# Patient Record
Sex: Male | Born: 1940 | Race: White | Hispanic: No | Smoking: Former smoker
Health system: Southern US, Community
[De-identification: ages and names within clinical notes are randomized; demographics above are authoritative.]

## PROBLEM LIST (undated history)

## (undated) DIAGNOSIS — I2510 Atherosclerotic heart disease of native coronary artery without angina pectoris: Secondary | ICD-10-CM

## (undated) DIAGNOSIS — I7143 Infrarenal abdominal aortic aneurysm, without rupture: Secondary | ICD-10-CM

## (undated) DIAGNOSIS — E669 Obesity, unspecified: Secondary | ICD-10-CM

## (undated) DIAGNOSIS — I5042 Chronic combined systolic (congestive) and diastolic (congestive) heart failure: Secondary | ICD-10-CM

## (undated) DIAGNOSIS — F419 Anxiety disorder, unspecified: Secondary | ICD-10-CM

## (undated) DIAGNOSIS — F32A Depression, unspecified: Secondary | ICD-10-CM

## (undated) DIAGNOSIS — Z8489 Family history of other specified conditions: Secondary | ICD-10-CM

## (undated) DIAGNOSIS — M5126 Other intervertebral disc displacement, lumbar region: Secondary | ICD-10-CM

## (undated) DIAGNOSIS — I714 Abdominal aortic aneurysm, without rupture: Secondary | ICD-10-CM

## (undated) DIAGNOSIS — E119 Type 2 diabetes mellitus without complications: Secondary | ICD-10-CM

## (undated) DIAGNOSIS — M1990 Unspecified osteoarthritis, unspecified site: Secondary | ICD-10-CM

## (undated) DIAGNOSIS — I2119 ST elevation (STEMI) myocardial infarction involving other coronary artery of inferior wall: Secondary | ICD-10-CM

## (undated) DIAGNOSIS — I10 Essential (primary) hypertension: Secondary | ICD-10-CM

## (undated) DIAGNOSIS — E781 Pure hyperglyceridemia: Secondary | ICD-10-CM

## (undated) DIAGNOSIS — K219 Gastro-esophageal reflux disease without esophagitis: Secondary | ICD-10-CM

## (undated) DIAGNOSIS — I4819 Other persistent atrial fibrillation: Secondary | ICD-10-CM

## (undated) DIAGNOSIS — I255 Ischemic cardiomyopathy: Secondary | ICD-10-CM

## (undated) HISTORY — PX: TONSILLECTOMY: SUR1361

## (undated) HISTORY — PX: BACK SURGERY: SHX140

## (undated) HISTORY — DX: Gastro-esophageal reflux disease without esophagitis: K21.9

## (undated) HISTORY — DX: Obesity, unspecified: E66.9

## (undated) HISTORY — DX: Essential (primary) hypertension: I10

## (undated) HISTORY — DX: ST elevation (STEMI) myocardial infarction involving other coronary artery of inferior wall: I21.19

## (undated) HISTORY — PX: LUMBAR DISC SURGERY: SHX700

---

## 1997-05-26 HISTORY — PX: CORONARY ARTERY BYPASS GRAFT: SHX141

## 1998-03-26 HISTORY — PX: OTHER SURGICAL HISTORY: SHX169

## 1998-04-13 ENCOUNTER — Inpatient Hospital Stay (HOSPITAL_COMMUNITY): Admission: EM | Admit: 1998-04-13 | Discharge: 1998-04-22 | Payer: Self-pay | Admitting: Internal Medicine

## 1998-04-13 ENCOUNTER — Encounter: Payer: Self-pay | Admitting: Internal Medicine

## 1998-04-16 ENCOUNTER — Encounter: Payer: Self-pay | Admitting: Cardiothoracic Surgery

## 1998-04-17 ENCOUNTER — Encounter: Payer: Self-pay | Admitting: Cardiothoracic Surgery

## 1998-04-18 ENCOUNTER — Encounter: Payer: Self-pay | Admitting: Cardiothoracic Surgery

## 1998-04-19 ENCOUNTER — Encounter: Payer: Self-pay | Admitting: Cardiothoracic Surgery

## 1998-06-03 ENCOUNTER — Emergency Department (HOSPITAL_COMMUNITY): Admission: EM | Admit: 1998-06-03 | Discharge: 1998-06-03 | Payer: Self-pay | Admitting: Emergency Medicine

## 1998-06-03 ENCOUNTER — Encounter: Payer: Self-pay | Admitting: Emergency Medicine

## 2000-11-01 ENCOUNTER — Emergency Department (HOSPITAL_COMMUNITY): Admission: EM | Admit: 2000-11-01 | Discharge: 2000-11-01 | Payer: Self-pay | Admitting: Emergency Medicine

## 2006-01-25 ENCOUNTER — Other Ambulatory Visit: Payer: Self-pay

## 2006-01-25 ENCOUNTER — Emergency Department: Payer: Self-pay | Admitting: Unknown Physician Specialty

## 2006-01-26 ENCOUNTER — Emergency Department: Payer: Self-pay | Admitting: Emergency Medicine

## 2011-07-17 DIAGNOSIS — E119 Type 2 diabetes mellitus without complications: Secondary | ICD-10-CM | POA: Diagnosis not present

## 2011-07-17 DIAGNOSIS — E7800 Pure hypercholesterolemia, unspecified: Secondary | ICD-10-CM | POA: Diagnosis not present

## 2011-07-17 DIAGNOSIS — I259 Chronic ischemic heart disease, unspecified: Secondary | ICD-10-CM | POA: Diagnosis not present

## 2011-07-17 DIAGNOSIS — I10 Essential (primary) hypertension: Secondary | ICD-10-CM | POA: Diagnosis not present

## 2011-10-31 DIAGNOSIS — E785 Hyperlipidemia, unspecified: Secondary | ICD-10-CM | POA: Diagnosis not present

## 2011-10-31 DIAGNOSIS — E559 Vitamin D deficiency, unspecified: Secondary | ICD-10-CM | POA: Diagnosis not present

## 2011-10-31 DIAGNOSIS — E1129 Type 2 diabetes mellitus with other diabetic kidney complication: Secondary | ICD-10-CM | POA: Diagnosis not present

## 2011-11-07 DIAGNOSIS — E1129 Type 2 diabetes mellitus with other diabetic kidney complication: Secondary | ICD-10-CM | POA: Diagnosis not present

## 2011-11-07 DIAGNOSIS — E785 Hyperlipidemia, unspecified: Secondary | ICD-10-CM | POA: Diagnosis not present

## 2011-11-07 DIAGNOSIS — I10 Essential (primary) hypertension: Secondary | ICD-10-CM | POA: Diagnosis not present

## 2011-11-07 DIAGNOSIS — I2510 Atherosclerotic heart disease of native coronary artery without angina pectoris: Secondary | ICD-10-CM | POA: Diagnosis not present

## 2011-11-07 DIAGNOSIS — N182 Chronic kidney disease, stage 2 (mild): Secondary | ICD-10-CM | POA: Diagnosis not present

## 2012-05-03 DIAGNOSIS — E559 Vitamin D deficiency, unspecified: Secondary | ICD-10-CM | POA: Diagnosis not present

## 2012-05-03 DIAGNOSIS — Z125 Encounter for screening for malignant neoplasm of prostate: Secondary | ICD-10-CM | POA: Diagnosis not present

## 2012-05-03 DIAGNOSIS — Z0000 Encounter for general adult medical examination without abnormal findings: Secondary | ICD-10-CM | POA: Diagnosis not present

## 2012-05-03 DIAGNOSIS — Z1331 Encounter for screening for depression: Secondary | ICD-10-CM | POA: Diagnosis not present

## 2012-05-03 DIAGNOSIS — E1129 Type 2 diabetes mellitus with other diabetic kidney complication: Secondary | ICD-10-CM | POA: Diagnosis not present

## 2012-05-03 DIAGNOSIS — E669 Obesity, unspecified: Secondary | ICD-10-CM | POA: Diagnosis not present

## 2012-05-03 DIAGNOSIS — E785 Hyperlipidemia, unspecified: Secondary | ICD-10-CM | POA: Diagnosis not present

## 2012-05-03 DIAGNOSIS — I10 Essential (primary) hypertension: Secondary | ICD-10-CM | POA: Diagnosis not present

## 2012-05-04 DIAGNOSIS — H5200 Hypermetropia, unspecified eye: Secondary | ICD-10-CM | POA: Diagnosis not present

## 2012-05-04 DIAGNOSIS — H25019 Cortical age-related cataract, unspecified eye: Secondary | ICD-10-CM | POA: Diagnosis not present

## 2012-05-04 DIAGNOSIS — H52229 Regular astigmatism, unspecified eye: Secondary | ICD-10-CM | POA: Diagnosis not present

## 2012-05-04 DIAGNOSIS — H2510 Age-related nuclear cataract, unspecified eye: Secondary | ICD-10-CM | POA: Diagnosis not present

## 2012-05-10 DIAGNOSIS — E1129 Type 2 diabetes mellitus with other diabetic kidney complication: Secondary | ICD-10-CM | POA: Diagnosis not present

## 2012-05-10 DIAGNOSIS — I10 Essential (primary) hypertension: Secondary | ICD-10-CM | POA: Diagnosis not present

## 2012-05-10 DIAGNOSIS — N182 Chronic kidney disease, stage 2 (mild): Secondary | ICD-10-CM | POA: Diagnosis not present

## 2012-05-10 DIAGNOSIS — E785 Hyperlipidemia, unspecified: Secondary | ICD-10-CM | POA: Diagnosis not present

## 2012-05-24 DIAGNOSIS — E782 Mixed hyperlipidemia: Secondary | ICD-10-CM | POA: Diagnosis not present

## 2012-05-24 DIAGNOSIS — I25810 Atherosclerosis of coronary artery bypass graft(s) without angina pectoris: Secondary | ICD-10-CM | POA: Diagnosis not present

## 2012-05-24 DIAGNOSIS — I10 Essential (primary) hypertension: Secondary | ICD-10-CM | POA: Diagnosis not present

## 2012-05-27 ENCOUNTER — Other Ambulatory Visit (HOSPITAL_COMMUNITY): Payer: Self-pay | Admitting: Internal Medicine

## 2012-05-27 DIAGNOSIS — Z951 Presence of aortocoronary bypass graft: Secondary | ICD-10-CM

## 2012-05-27 DIAGNOSIS — R9431 Abnormal electrocardiogram [ECG] [EKG]: Secondary | ICD-10-CM

## 2012-05-31 ENCOUNTER — Ambulatory Visit (HOSPITAL_COMMUNITY)
Admission: RE | Admit: 2012-05-31 | Discharge: 2012-05-31 | Disposition: A | Discharge status: Home / Self Care | Payer: Medicare Other | Source: Ambulatory Visit | Attending: Internal Medicine | Admitting: Internal Medicine

## 2012-05-31 DIAGNOSIS — I369 Nonrheumatic tricuspid valve disorder, unspecified: Secondary | ICD-10-CM | POA: Insufficient documentation

## 2012-05-31 DIAGNOSIS — I080 Rheumatic disorders of both mitral and aortic valves: Secondary | ICD-10-CM | POA: Diagnosis not present

## 2012-05-31 DIAGNOSIS — Z951 Presence of aortocoronary bypass graft: Secondary | ICD-10-CM

## 2012-05-31 DIAGNOSIS — R9431 Abnormal electrocardiogram [ECG] [EKG]: Secondary | ICD-10-CM | POA: Diagnosis not present

## 2012-05-31 NOTE — Progress Notes (Signed)
2D Echo Performed 05/31/2012    [employer], [facility]  

## 2012-06-14 ENCOUNTER — Emergency Department: Payer: Self-pay | Admitting: Unknown Physician Specialty

## 2012-06-14 DIAGNOSIS — S61209A Unspecified open wound of unspecified finger without damage to nail, initial encounter: Secondary | ICD-10-CM | POA: Diagnosis not present

## 2012-06-24 ENCOUNTER — Emergency Department: Payer: Self-pay | Admitting: Emergency Medicine

## 2012-06-24 DIAGNOSIS — I252 Old myocardial infarction: Secondary | ICD-10-CM | POA: Diagnosis not present

## 2012-06-24 DIAGNOSIS — Z951 Presence of aortocoronary bypass graft: Secondary | ICD-10-CM | POA: Diagnosis not present

## 2012-06-24 DIAGNOSIS — Z4802 Encounter for removal of sutures: Secondary | ICD-10-CM | POA: Diagnosis not present

## 2012-06-29 DIAGNOSIS — J019 Acute sinusitis, unspecified: Secondary | ICD-10-CM | POA: Diagnosis not present

## 2012-07-01 ENCOUNTER — Encounter (HOSPITAL_COMMUNITY): Payer: Self-pay | Admitting: *Deleted

## 2012-07-01 ENCOUNTER — Emergency Department (HOSPITAL_COMMUNITY)
Admission: EM | Admit: 2012-07-01 | Discharge: 2012-07-01 | Disposition: A | Discharge status: Home / Self Care | Payer: Medicare Other | Attending: Emergency Medicine | Admitting: Emergency Medicine

## 2012-07-01 ENCOUNTER — Emergency Department (HOSPITAL_COMMUNITY): Payer: Medicare Other

## 2012-07-01 DIAGNOSIS — J069 Acute upper respiratory infection, unspecified: Secondary | ICD-10-CM | POA: Insufficient documentation

## 2012-07-01 DIAGNOSIS — Z8739 Personal history of other diseases of the musculoskeletal system and connective tissue: Secondary | ICD-10-CM | POA: Insufficient documentation

## 2012-07-01 DIAGNOSIS — R0981 Nasal congestion: Secondary | ICD-10-CM

## 2012-07-01 DIAGNOSIS — J3489 Other specified disorders of nose and nasal sinuses: Secondary | ICD-10-CM | POA: Diagnosis not present

## 2012-07-01 DIAGNOSIS — I2510 Atherosclerotic heart disease of native coronary artery without angina pectoris: Secondary | ICD-10-CM | POA: Insufficient documentation

## 2012-07-01 DIAGNOSIS — R062 Wheezing: Secondary | ICD-10-CM | POA: Diagnosis not present

## 2012-07-01 DIAGNOSIS — Z951 Presence of aortocoronary bypass graft: Secondary | ICD-10-CM | POA: Diagnosis not present

## 2012-07-01 DIAGNOSIS — E781 Pure hyperglyceridemia: Secondary | ICD-10-CM | POA: Insufficient documentation

## 2012-07-01 DIAGNOSIS — J988 Other specified respiratory disorders: Secondary | ICD-10-CM

## 2012-07-01 DIAGNOSIS — Z79899 Other long term (current) drug therapy: Secondary | ICD-10-CM | POA: Insufficient documentation

## 2012-07-01 DIAGNOSIS — J811 Chronic pulmonary edema: Secondary | ICD-10-CM | POA: Diagnosis not present

## 2012-07-01 HISTORY — DX: Pure hyperglyceridemia: E78.1

## 2012-07-01 HISTORY — DX: Other intervertebral disc displacement, lumbar region: M51.26

## 2012-07-01 HISTORY — DX: Atherosclerotic heart disease of native coronary artery without angina pectoris: I25.10

## 2012-07-01 HISTORY — DX: Unspecified osteoarthritis, unspecified site: M19.90

## 2012-07-01 LAB — BASIC METABOLIC PANEL
BUN: 16 mg/dL (ref 6–23)
CO2: 26 mEq/L (ref 19–32)
Chloride: 105 mEq/L (ref 96–112)
Creatinine, Ser: 1.21 mg/dL (ref 0.50–1.35)
Glucose, Bld: 109 mg/dL — ABNORMAL HIGH (ref 70–99)

## 2012-07-01 LAB — CBC WITH DIFFERENTIAL/PLATELET
HCT: 41.6 % (ref 39.0–52.0)
Hemoglobin: 13.9 g/dL (ref 13.0–17.0)
Lymphocytes Relative: 22 % (ref 12–46)
Lymphs Abs: 1.1 10*3/uL (ref 0.7–4.0)
MCV: 91.2 fL (ref 78.0–100.0)
Monocytes Absolute: 0.8 10*3/uL (ref 0.1–1.0)
Monocytes Relative: 15 % — ABNORMAL HIGH (ref 3–12)
Neutro Abs: 2.9 10*3/uL (ref 1.7–7.7)
WBC: 5 10*3/uL (ref 4.0–10.5)

## 2012-07-01 MED ORDER — ALBUTEROL SULFATE (5 MG/ML) 0.5% IN NEBU
5.0000 mg | INHALATION_SOLUTION | Freq: Once | RESPIRATORY_TRACT | Status: AC
  Administered 2012-07-01: 5 mg via RESPIRATORY_TRACT
  Filled 2012-07-01: qty 1

## 2012-07-01 MED ORDER — AEROCHAMBER Z-STAT PLUS/MEDIUM MISC
1.0000 | Freq: Once | Status: AC
  Administered 2012-07-01: 1
  Filled 2012-07-01: qty 1

## 2012-07-01 MED ORDER — ALBUTEROL SULFATE HFA 108 (90 BASE) MCG/ACT IN AERS
1.0000 | INHALATION_SPRAY | RESPIRATORY_TRACT | Status: DC | PRN
  Administered 2012-07-01: 2 via RESPIRATORY_TRACT
  Filled 2012-07-01: qty 6.7

## 2012-07-01 MED ORDER — SALINE SPRAY 0.65 % NA SOLN
1.0000 | Freq: Once | NASAL | Status: AC
  Administered 2012-07-01: 1 via NASAL
  Filled 2012-07-01: qty 44

## 2012-07-01 NOTE — ED Notes (Signed)
Pt denies SOB but states "I just feel like I am not getting enough oxygen. It started on Sunday, but has gotten a lot worse since yesterday evening. I have also had a headache and feel congested." Pt reports runny nose since Monday and dry cough since Sunday.

## 2012-07-01 NOTE — ED Notes (Addendum)
Ambulated pt in hall, sats remained at 96-94% on RA

## 2012-07-01 NOTE — ED Notes (Signed)
Pt reports being diagnosed with a sinus infection at Dr. Zebedee Iba office Tuesday. Was prescribed a Z-pack, cough syrup with codeine, and afrin spray. States afrin and codeine caused his heart rate to speed up. States that he used nasacort and had some relief with it. States that he cannot breath due to the sinus congestion.

## 2012-07-01 NOTE — ED Notes (Signed)
Pt at 91% on RA. Placed on 2L.

## 2012-07-01 NOTE — ED Provider Notes (Signed)
History     CSN: [id number]  Arrival date & time 07/01/12  0113   First MD Initiated Contact with Patient 07/01/12 0405      Chief Complaint  Patient presents with  . Shortness of Breath    (Consider location/radiation/quality/duration/timing/severity/associated sxs/prior treatment) HPI 72 yo male presents to the ER with complaint of nasal congestion and difficulty breathing.  Pt reports onset of URI sxs on Sunday with congestion, slight cough.  No fevers.  Pt was seen by his PCM on Tuesday, started on zpack, afrin, cough syrup.  About 4 hours after taking afrin and cough syrup, pt was woken with palpitations around 2 am.  Pt has self d/c afrin and cough syrup, and has switched to otc nasal steroid spray.  Pt feels this has helped slightly, but still having difficulties breathing through his nose.  No chest pain, no productive cough, no fevers.  No leg swelling, abd distension.   Past Medical History  Diagnosis Date  . Coronary artery disease   . Ruptured lumbar disc   . Arthritis   . High triglycerides     Past Surgical History  Procedure Date  . Coronary artery bypass graft   . Lumbar disc surgery   . Tonsillectomy     No family history on file.  History  Substance Use Topics  . Smoking status: Not on file  . Smokeless tobacco: Not on file  . Alcohol Use: No      Review of Systems  All other systems reviewed and are negative.    Allergies  Aspirin and Other  Home Medications   Current Outpatient Rx  Name  Route  Sig  Dispense  Refill  . AZITHROMYCIN 250 MG PO TABS   Oral   Take 250 mg by mouth daily.         Marland Kitchen GEMFIBROZIL 600 MG PO TABS   Oral   Take 600 mg by mouth 2 (two) times daily before a meal.         . GUAIFENESIN-CODEINE 100-10 MG/5ML PO SYRP   Oral   Take 10 mLs by mouth 3 (three) times daily as needed. For cough         . LISINOPRIL 5 MG PO TABS   Oral   Take 5 mg by mouth at bedtime.         Marland Kitchen METOPROLOL TARTRATE 25 MG PO  TABS   Oral   Take 12.5 mg by mouth 2 (two) times daily.         Marland Kitchen RANITIDINE HCL 150 MG PO TABS   Oral   Take 150 mg by mouth 2 (two) times daily.         . TRIAMCINOLONE ACETONIDE 55 MCG/ACT NA INHA   Nasal   Place 2 sprays into the nose daily.           BP 142/83  Pulse 76  Temp 98 F (36.7 C) (Oral)  Resp 19  Ht 5\' 11"  (1.803 m)  Wt 284 lb (128.822 kg)  BMI 39.61 kg/m2  SpO2 95%  Physical Exam  Nursing note and vitals reviewed. Constitutional: He is oriented to person, place, and time. He appears well-developed and well-nourished.  HENT:  Head: Normocephalic and atraumatic.  Right Ear: External ear normal.  Left Ear: External ear normal.  Mouth/Throat: Oropharynx is clear and moist.       Nasal congestion noted  Eyes: Conjunctivae normal and EOM are normal. Pupils are equal, round, and reactive to  light.  Neck: Normal range of motion. Neck supple. No JVD present. No tracheal deviation present. No thyromegaly present.  Cardiovascular: Normal rate, regular rhythm, normal heart sounds and intact distal pulses.  Exam reveals no gallop and no friction rub.   No murmur heard. Pulmonary/Chest: Effort normal and breath sounds normal. No stridor. No respiratory distress. He has no wheezes. He has no rales. He exhibits no tenderness.  Abdominal: Soft. Bowel sounds are normal. He exhibits no distension and no mass. There is no tenderness. There is no rebound and no guarding.  Musculoskeletal: Normal range of motion. He exhibits no edema and no tenderness.  Lymphadenopathy:    He has no cervical adenopathy.  Neurological: He is alert and oriented to person, place, and time. He exhibits normal muscle tone. Coordination normal.  Skin: Skin is dry. No rash noted. No erythema. No pallor.  Psychiatric: He has a normal mood and affect. His behavior is normal. Judgment and thought content normal.    ED Course  Procedures (including critical care time)  Labs Reviewed  CBC  WITH DIFFERENTIAL - Abnormal; Notable for the following:    Monocytes Relative 15 (*)     All other components within normal limits  BASIC METABOLIC PANEL - Abnormal; Notable for the following:    Glucose, Bld 109 (*)     GFR calc non Af Amer 58 (*)     GFR calc Af Amer 68 (*)     All other components within normal limits   Dg Chest 2 View  07/01/2012  *RADIOLOGY REPORT*  Clinical Data: Cough, shortness of breath, wheezing.  CHEST - 2 VIEW  Comparison: None.  Findings: Postoperative changes in the mediastinum with sternotomy wires, surgical clips, and vascular markers present.  Shallow inspiration.  Cardiac enlargement with mild pulmonary vascular congestion.  Hazy perihilar changes suggest early edema.  There appears to be increased density in the left lower lung behind the heart suggesting superimposed infiltration or pneumonia.  No blunting of costophrenic angles.  No pneumothorax.  Degenerative changes in the spine.  IMPRESSION: Cardiac enlargement with pulmonary vascular congestion and mild perihilar edema.  Superimposed infiltration in the left lung base suggest pneumonia.   Original Report Authenticated By: Burman Nieves, M.D.     Date: 07/01/2012  Rate: 67  Rhythm: normal sinus rhythm and premature ventricular contractions (PVC)  QRS Axis: normal  Intervals: PR prolonged  ST/T Wave abnormalities: normal  Conduction Disutrbances:first-degree A-V block   Narrative Interpretation: RBBB has resolved  Old EKG Reviewed: changes noted    1. Nasal congestion   2. Upper respiratory infection   3. Wheezing-associated respiratory infection (WARI)       MDM  72 year old male with upper respiratory illness, nasal congestion. Chest x-ray suggests possible infiltrate, but he has not had fever, has normal white count and no significant cough. He has had some wheezing today. He is feeling better after nasal saline, and albuterol inhaler. He has a bili without drop in his O2 saturation. Will  discharge home to followup with his primary care Dr. for worsening.        Olivia Mackie, MD 07/01/12 [phone]

## 2012-07-02 DIAGNOSIS — I25810 Atherosclerosis of coronary artery bypass graft(s) without angina pectoris: Secondary | ICD-10-CM | POA: Diagnosis not present

## 2012-07-02 DIAGNOSIS — F411 Generalized anxiety disorder: Secondary | ICD-10-CM | POA: Diagnosis not present

## 2012-07-06 DIAGNOSIS — J069 Acute upper respiratory infection, unspecified: Secondary | ICD-10-CM | POA: Diagnosis not present

## 2012-07-06 DIAGNOSIS — J988 Other specified respiratory disorders: Secondary | ICD-10-CM | POA: Diagnosis not present

## 2012-07-16 DIAGNOSIS — Z951 Presence of aortocoronary bypass graft: Secondary | ICD-10-CM | POA: Diagnosis not present

## 2012-07-16 DIAGNOSIS — J988 Other specified respiratory disorders: Secondary | ICD-10-CM | POA: Diagnosis not present

## 2012-11-01 DIAGNOSIS — I10 Essential (primary) hypertension: Secondary | ICD-10-CM | POA: Diagnosis not present

## 2012-11-01 DIAGNOSIS — E1129 Type 2 diabetes mellitus with other diabetic kidney complication: Secondary | ICD-10-CM | POA: Diagnosis not present

## 2012-11-01 DIAGNOSIS — E785 Hyperlipidemia, unspecified: Secondary | ICD-10-CM | POA: Diagnosis not present

## 2012-11-08 DIAGNOSIS — E785 Hyperlipidemia, unspecified: Secondary | ICD-10-CM | POA: Diagnosis not present

## 2012-11-08 DIAGNOSIS — E1129 Type 2 diabetes mellitus with other diabetic kidney complication: Secondary | ICD-10-CM | POA: Diagnosis not present

## 2012-11-08 DIAGNOSIS — I10 Essential (primary) hypertension: Secondary | ICD-10-CM | POA: Diagnosis not present

## 2012-11-08 DIAGNOSIS — N182 Chronic kidney disease, stage 2 (mild): Secondary | ICD-10-CM | POA: Diagnosis not present

## 2012-12-18 ENCOUNTER — Encounter (HOSPITAL_COMMUNITY): Payer: Self-pay

## 2012-12-18 ENCOUNTER — Inpatient Hospital Stay (HOSPITAL_COMMUNITY)
Admission: EM | Admit: 2012-12-18 | Discharge: 2012-12-22 | DRG: 246 | Disposition: A | Discharge status: Home / Self Care | Payer: Medicare Other | Attending: Cardiovascular Disease | Admitting: Cardiovascular Disease

## 2012-12-18 ENCOUNTER — Encounter (HOSPITAL_COMMUNITY)
Admission: EM | Disposition: A | Discharge status: Home / Self Care | Payer: Self-pay | Source: Home / Self Care | Attending: Cardiovascular Disease

## 2012-12-18 ENCOUNTER — Emergency Department (HOSPITAL_COMMUNITY): Payer: Medicare Other

## 2012-12-18 ENCOUNTER — Ambulatory Visit (HOSPITAL_COMMUNITY): Admit: 2012-12-18 | Payer: Self-pay | Admitting: Cardiovascular Disease

## 2012-12-18 DIAGNOSIS — M1990 Unspecified osteoarthritis, unspecified site: Secondary | ICD-10-CM | POA: Diagnosis present

## 2012-12-18 DIAGNOSIS — M479 Spondylosis, unspecified: Secondary | ICD-10-CM | POA: Diagnosis present

## 2012-12-18 DIAGNOSIS — I509 Heart failure, unspecified: Secondary | ICD-10-CM | POA: Diagnosis present

## 2012-12-18 DIAGNOSIS — Z6833 Body mass index (BMI) 33.0-33.9, adult: Secondary | ICD-10-CM | POA: Diagnosis not present

## 2012-12-18 DIAGNOSIS — N289 Disorder of kidney and ureter, unspecified: Secondary | ICD-10-CM | POA: Diagnosis not present

## 2012-12-18 DIAGNOSIS — I252 Old myocardial infarction: Secondary | ICD-10-CM

## 2012-12-18 DIAGNOSIS — R0989 Other specified symptoms and signs involving the circulatory and respiratory systems: Secondary | ICD-10-CM | POA: Diagnosis not present

## 2012-12-18 DIAGNOSIS — E781 Pure hyperglyceridemia: Secondary | ICD-10-CM | POA: Diagnosis present

## 2012-12-18 DIAGNOSIS — I2589 Other forms of chronic ischemic heart disease: Secondary | ICD-10-CM | POA: Diagnosis not present

## 2012-12-18 DIAGNOSIS — E669 Obesity, unspecified: Secondary | ICD-10-CM | POA: Diagnosis present

## 2012-12-18 DIAGNOSIS — Z9861 Coronary angioplasty status: Secondary | ICD-10-CM

## 2012-12-18 DIAGNOSIS — E785 Hyperlipidemia, unspecified: Secondary | ICD-10-CM | POA: Diagnosis present

## 2012-12-18 DIAGNOSIS — I5041 Acute combined systolic (congestive) and diastolic (congestive) heart failure: Secondary | ICD-10-CM | POA: Diagnosis present

## 2012-12-18 DIAGNOSIS — I517 Cardiomegaly: Secondary | ICD-10-CM | POA: Diagnosis not present

## 2012-12-18 DIAGNOSIS — M25519 Pain in unspecified shoulder: Secondary | ICD-10-CM | POA: Diagnosis not present

## 2012-12-18 DIAGNOSIS — I25810 Atherosclerosis of coronary artery bypass graft(s) without angina pectoris: Secondary | ICD-10-CM

## 2012-12-18 DIAGNOSIS — M436 Torticollis: Secondary | ICD-10-CM | POA: Diagnosis not present

## 2012-12-18 DIAGNOSIS — I2119 ST elevation (STEMI) myocardial infarction involving other coronary artery of inferior wall: Secondary | ICD-10-CM | POA: Diagnosis not present

## 2012-12-18 DIAGNOSIS — I219 Acute myocardial infarction, unspecified: Secondary | ICD-10-CM

## 2012-12-18 DIAGNOSIS — I255 Ischemic cardiomyopathy: Secondary | ICD-10-CM | POA: Diagnosis present

## 2012-12-18 DIAGNOSIS — I213 ST elevation (STEMI) myocardial infarction of unspecified site: Secondary | ICD-10-CM | POA: Diagnosis present

## 2012-12-18 DIAGNOSIS — R079 Chest pain, unspecified: Secondary | ICD-10-CM | POA: Diagnosis not present

## 2012-12-18 HISTORY — DX: Ischemic cardiomyopathy: I25.5

## 2012-12-18 HISTORY — DX: ST elevation (STEMI) myocardial infarction involving other coronary artery of inferior wall: I21.19

## 2012-12-18 HISTORY — PX: LEFT HEART CATHETERIZATION WITH CORONARY/GRAFT ANGIOGRAM: SHX5450

## 2012-12-18 LAB — CBC
HCT: 43.3 % (ref 39.0–52.0)
HCT: 38.5 % — ABNORMAL LOW (ref 39.0–52.0)
Hemoglobin: 13.3 g/dL (ref 13.0–17.0)
MCH: 30.9 pg (ref 26.0–34.0)
MCH: 30.8 pg (ref 26.0–34.0)
MCHC: 34.6 g/dL (ref 30.0–36.0)
MCHC: 34.5 g/dL (ref 30.0–36.0)
MCV: 89.1 fL (ref 78.0–100.0)
MCV: 89.1 fL (ref 78.0–100.0)
RDW: 13.2 % (ref 11.5–15.5)
RDW: 13.1 % (ref 11.5–15.5)
WBC: 8.3 10*3/uL (ref 4.0–10.5)

## 2012-12-18 LAB — TROPONIN I: Troponin I: >20 ng/mL (ref <0.30)

## 2012-12-18 LAB — BASIC METABOLIC PANEL
BUN: 24 mg/dL — ABNORMAL HIGH (ref 6–23)
Chloride: 101 mEq/L (ref 96–112)
Creatinine, Ser: 1.12 mg/dL (ref 0.50–1.35)
GFR calc Af Amer: 74 mL/min — ABNORMAL LOW (ref >90)

## 2012-12-18 SURGERY — LEFT HEART CATHETERIZATION WITH CORONARY/GRAFT ANGIOGRAM

## 2012-12-18 MED ORDER — ONDANSETRON HCL 4 MG/2ML IJ SOLN: 4.0000 mg | Freq: Four times a day (QID) | INTRAMUSCULAR | Status: DC | PRN

## 2012-12-18 MED ORDER — EPTIFIBATIDE 75 MG/100ML IV SOLN
INTRAVENOUS | Status: AC
  Filled 2012-12-18: qty 100

## 2012-12-18 MED ORDER — FENTANYL CITRATE 0.05 MG/ML IJ SOLN
INTRAMUSCULAR | Status: AC
  Filled 2012-12-18: qty 2

## 2012-12-18 MED ORDER — MORPHINE SULFATE 2 MG/ML IJ SOLN
2.0000 mg | INTRAMUSCULAR | Status: DC | PRN
  Administered 2012-12-18 – 2012-12-22 (×2): 2 mg via INTRAVENOUS
  Filled 2012-12-18 (×2): qty 1

## 2012-12-18 MED ORDER — HEPARIN (PORCINE) IN NACL 2-0.9 UNIT/ML-% IJ SOLN
INTRAMUSCULAR | Status: AC
  Filled 2012-12-18: qty 500

## 2012-12-18 MED ORDER — NITROGLYCERIN 0.4 MG SL SUBL
SUBLINGUAL_TABLET | SUBLINGUAL | Status: AC
  Administered 2012-12-18: 0.4 mg via SUBLINGUAL
  Filled 2012-12-18: qty 50

## 2012-12-18 MED ORDER — NITROGLYCERIN 0.4 MG SL SUBL
0.4000 mg | SUBLINGUAL_TABLET | SUBLINGUAL | Status: AC | PRN
  Administered 2012-12-18 – 2012-12-22 (×4): 0.4 mg via SUBLINGUAL
  Filled 2012-12-18: qty 25

## 2012-12-18 MED ORDER — SODIUM CHLORIDE 0.9 % IV SOLN: 0.2500 mg/kg/h | INTRAVENOUS | Status: AC

## 2012-12-18 MED ORDER — ALPRAZOLAM 0.5 MG PO TABS
0.5000 mg | ORAL_TABLET | Freq: Three times a day (TID) | ORAL | Status: DC | PRN
  Administered 2012-12-18 – 2012-12-22 (×2): 0.5 mg via ORAL
  Filled 2012-12-18 (×2): qty 1

## 2012-12-18 MED ORDER — SODIUM CHLORIDE 0.9 % IV SOLN
1.7500 mg/kg/h | INTRAVENOUS | Status: DC
  Filled 2012-12-18 (×2): qty 250

## 2012-12-18 MED ORDER — ASPIRIN EC 81 MG PO TBEC
81.0000 mg | DELAYED_RELEASE_TABLET | Freq: Every day | ORAL | Status: DC
  Administered 2012-12-19 – 2012-12-22 (×4): 81 mg via ORAL
  Filled 2012-12-18 (×5): qty 1

## 2012-12-18 MED ORDER — MORPHINE SULFATE 2 MG/ML IJ SOLN
INTRAMUSCULAR | Status: AC
Start: 2012-12-18 — End: 2012-12-18
  Filled 2012-12-18: qty 1

## 2012-12-18 MED ORDER — TICAGRELOR 90 MG PO TABS
90.0000 mg | ORAL_TABLET | Freq: Two times a day (BID) | ORAL | Status: DC
  Administered 2012-12-18 – 2012-12-22 (×8): 90 mg via ORAL
  Filled 2012-12-18 (×10): qty 1

## 2012-12-18 MED ORDER — SODIUM CHLORIDE 0.9 % IV SOLN
1.7500 mg/kg/h | INTRAVENOUS | Status: AC
  Administered 2012-12-18: 1.75 mg/kg/h via INTRAVENOUS
  Filled 2012-12-18: qty 250

## 2012-12-18 MED ORDER — NITROGLYCERIN 0.2 MG/ML ON CALL CATH LAB
INTRAVENOUS | Status: AC
  Filled 2012-12-18: qty 1

## 2012-12-18 MED ORDER — MIDAZOLAM HCL 2 MG/2ML IJ SOLN
INTRAMUSCULAR | Status: AC
  Filled 2012-12-18: qty 2

## 2012-12-18 MED ORDER — MORPHINE SULFATE 2 MG/ML IJ SOLN
2.0000 mg | Freq: Once | INTRAMUSCULAR | Status: AC
  Administered 2012-12-18: 2 mg via INTRAVENOUS

## 2012-12-18 MED ORDER — LORAZEPAM 0.5 MG PO TABS
0.5000 mg | ORAL_TABLET | ORAL | Status: DC | PRN
  Administered 2012-12-18 – 2012-12-22 (×10): 0.5 mg via ORAL
  Filled 2012-12-18 (×10): qty 1

## 2012-12-18 MED ORDER — HEPARIN BOLUS VIA INFUSION: 4000.0000 [IU] | Freq: Once | INTRAVENOUS | Status: DC

## 2012-12-18 MED ORDER — HEPARIN (PORCINE) IN NACL 2-0.9 UNIT/ML-% IJ SOLN
INTRAMUSCULAR | Status: AC
  Filled 2012-12-18: qty 1000

## 2012-12-18 MED ORDER — VERAPAMIL HCL 2.5 MG/ML IV SOLN
INTRAVENOUS | Status: AC
  Filled 2012-12-18: qty 2

## 2012-12-18 MED ORDER — FLUTICASONE PROPIONATE 50 MCG/ACT NA SUSP
2.0000 | Freq: Every day | NASAL | Status: DC
  Filled 2012-12-18: qty 16

## 2012-12-18 MED ORDER — BIVALIRUDIN 250 MG IV SOLR
INTRAVENOUS | Status: AC
  Filled 2012-12-18: qty 250

## 2012-12-18 MED ORDER — LIDOCAINE HCL (PF) 1 % IJ SOLN
INTRAMUSCULAR | Status: AC
Start: 2012-12-18 — End: 2012-12-18
  Filled 2012-12-18: qty 30

## 2012-12-18 MED ORDER — ATORVASTATIN CALCIUM 80 MG PO TABS
80.0000 mg | ORAL_TABLET | Freq: Every day | ORAL | Status: DC
  Administered 2012-12-18 – 2012-12-21 (×4): 80 mg via ORAL
  Filled 2012-12-18 (×6): qty 1

## 2012-12-18 MED ORDER — FAMOTIDINE 20 MG PO TABS
20.0000 mg | ORAL_TABLET | Freq: Every day | ORAL | Status: DC
  Administered 2012-12-18 – 2012-12-22 (×5): 20 mg via ORAL
  Filled 2012-12-18 (×6): qty 1

## 2012-12-18 MED ORDER — FUROSEMIDE 10 MG/ML IJ SOLN
INTRAMUSCULAR | Status: AC
  Filled 2012-12-18: qty 4

## 2012-12-18 MED ORDER — NITROGLYCERIN IN D5W 200-5 MCG/ML-% IV SOLN
2.0000 ug/min | INTRAVENOUS | Status: DC
  Filled 2012-12-18: qty 250

## 2012-12-18 MED ORDER — NITROGLYCERIN IN D5W 200-5 MCG/ML-% IV SOLN
INTRAVENOUS | Status: AC
  Filled 2012-12-18: qty 250

## 2012-12-18 MED ORDER — ASPIRIN 81 MG PO CHEW
324.0000 mg | CHEWABLE_TABLET | Freq: Once | ORAL | Status: AC
  Administered 2012-12-22: 81 mg via ORAL

## 2012-12-18 MED ORDER — ACETAMINOPHEN 325 MG PO TABS
650.0000 mg | ORAL_TABLET | ORAL | Status: DC | PRN
  Administered 2012-12-18: 650 mg via ORAL
  Filled 2012-12-18: qty 2

## 2012-12-18 MED ORDER — SODIUM CHLORIDE 0.9 % IV SOLN
INTRAVENOUS | Status: DC
  Administered 2012-12-18: 14:00:00 via INTRAVENOUS

## 2012-12-18 MED ORDER — FUROSEMIDE 10 MG/ML IJ SOLN
20.0000 mg | Freq: Every day | INTRAMUSCULAR | Status: DC
  Administered 2012-12-18: 20 mg via INTRAVENOUS
  Filled 2012-12-18 (×2): qty 2

## 2012-12-18 MED ORDER — TICAGRELOR 90 MG PO TABS
ORAL_TABLET | ORAL | Status: AC
  Filled 2012-12-18: qty 2

## 2012-12-18 MED ORDER — EPTIFIBATIDE 75 MG/100ML IV SOLN
2.0000 ug/kg/min | INTRAVENOUS | Status: DC
  Administered 2012-12-18 – 2012-12-19 (×4): 2 ug/kg/min via INTRAVENOUS
  Filled 2012-12-18 (×5): qty 100

## 2012-12-18 MED ORDER — ATROPINE SULFATE 1 MG/ML IJ SOLN
INTRAMUSCULAR | Status: AC
  Filled 2012-12-18: qty 1

## 2012-12-18 MED ORDER — METOPROLOL TARTRATE 12.5 MG HALF TABLET
12.5000 mg | ORAL_TABLET | Freq: Two times a day (BID) | ORAL | Status: DC
  Administered 2012-12-18 – 2012-12-22 (×9): 12.5 mg via ORAL
  Filled 2012-12-18 (×12): qty 1

## 2012-12-18 NOTE — Progress Notes (Signed)
ANTICOAGULATION CONSULT NOTE - Initial Consult  Pharmacy Consult for Angiomax and Integrilin Indication: s/p PCI  Allergies  Allergen Reactions  . Aspirin Nausea And Vomiting    Can't take full strength uncoated aspirin  . Other Other (See Comments)    Pain medication that he was given after open heart surgery-sweating and hallucinations     Patient Measurements: Height: 6' (182.9 cm) Weight: 281 lb 1.4 oz (127.5 kg) IBW/kg (Calculated) : 77.6 Heparin Dosing Weight:   Vital Signs: Temp: 98.5 F (36.9 C) (07/26 0612) Temp src: Oral (07/26 0612) BP: 133/78 mmHg (07/26 1115) Pulse Rate: 68 (07/26 1115)  Labs:  Recent Labs  12/18/12 0613  HGB 15.0  HCT 43.3  PLT 196  LABPROT 13.3  INR 1.03  CREATININE 1.12    Estimated Creatinine Clearance: 83.5 ml/min (by C-G formula based on Cr of 1.12).   Medical History: Past Medical History  Diagnosis Date  . Coronary artery disease   . Ruptured lumbar disc   . Arthritis   . High triglycerides     Medications:  Prescriptions prior to admission  Medication Sig Dispense Refill  . aspirin 325 MG EC tablet Take 325 mg by mouth daily.      Marland Kitchen gemfibrozil (LOPID) 600 MG tablet Take 600 mg by mouth 2 (two) times daily before a meal.      . lisinopril (PRINIVIL,ZESTRIL) 5 MG tablet Take 5 mg by mouth at bedtime.      . metoprolol tartrate (LOPRESSOR) 25 MG tablet Take 12.5 mg by mouth 2 (two) times daily.      . ranitidine (ZANTAC) 150 MG tablet Take 150 mg by mouth 2 (two) times daily.      Marland Kitchen triamcinolone (NASACORT) 55 MCG/ACT nasal inhaler Place 2 sprays into the nose daily.        Assessment: 71yom admitted for STEMI who underwent PCI with PTCA/ thrombectomy of the occluded vessel with a DES. Per MD orders, continue Angiomax at reduced dose x 4hrs post procedure and Integrilin 24hrs post procedure. Per cath note, procedure was completed ~0900.  - Baseline labs: Hg 15, Plts 196 - No significant bleeding reported - Cath lab  weight: 124.7kg - CrCl >60 ml/min    Plan:  1. Angiomax 0.25mg /kg/hr (confirmed rate with Dr. Tresa Endo) x 4hrs 2. Integrilin 42mcg/kg/min x 24hrs 3. Check CBC/Platelets ~ 1700 4. Monitor patient for s/sx of bleeding  Cleon Dew [phone] 12/18/2012,11:30 AM

## 2012-12-18 NOTE — Progress Notes (Signed)
[place] , [place] , informed re pinkish hue to uop .Dime size area just noted to gauze over Rt groin sheath site , but Pt freq squirming hips( since adm to unit)despite freq reminder to keep torso ,hips and RLE still .

## 2012-12-18 NOTE — ED Provider Notes (Signed)
CSN: [id number]     Arrival date & time 12/18/12  0545 History     First MD Initiated Contact with Patient 12/18/12 [phone]     Chief Complaint  Patient presents with  . Chest Pain   (Consider location/radiation/quality/duration/timing/severity/associated sxs/prior Treatment) HPI History provided by patient. Around 2 AM with a burning chest pain. Has associated nausea but no diaphoresis or shortness of breath. Is followed by [facility] heart and vascular with previous CABG, stents and MI. Pain was 9/10 not associated with exertion or relieved by anything. No fevers. No cough. No leg pain or swelling. No abdominal pain or vomiting.   Past Medical History  Diagnosis Date  . Coronary artery disease   . Ruptured lumbar disc   . Arthritis   . High triglycerides    Past Surgical History  Procedure Laterality Date  . Coronary artery bypass graft    . Lumbar disc surgery    . Tonsillectomy     History reviewed. No pertinent family history. History  Substance Use Topics  . Smoking status: Not on file  . Smokeless tobacco: Not on file  . Alcohol Use: No    Review of Systems  Constitutional: Negative for fever and chills.  HENT: Negative for neck pain and neck stiffness.   Eyes: Negative for visual disturbance.  Respiratory: Negative for shortness of breath.   Cardiovascular: Positive for chest pain.  Gastrointestinal: Positive for nausea. Negative for abdominal pain.  Genitourinary: Negative for dysuria.  Musculoskeletal: Negative for back pain.  Skin: Negative for rash.  Neurological: Negative for headaches.  All other systems reviewed and are negative.    Allergies  Aspirin and Other  Home Medications   Current Outpatient Rx  Name  Route  Sig  Dispense  Refill  . aspirin 325 MG EC tablet   Oral   Take 325 mg by mouth daily.         Marland Kitchen gemfibrozil (LOPID) 600 MG tablet   Oral   Take 600 mg by mouth 2 (two) times daily before a meal.         . lisinopril  (PRINIVIL,ZESTRIL) 5 MG tablet   Oral   Take 5 mg by mouth at bedtime.         . metoprolol tartrate (LOPRESSOR) 25 MG tablet   Oral   Take 12.5 mg by mouth 2 (two) times daily.         . ranitidine (ZANTAC) 150 MG tablet   Oral   Take 150 mg by mouth 2 (two) times daily.         Marland Kitchen triamcinolone (NASACORT) 55 MCG/ACT nasal inhaler   Nasal   Place 2 sprays into the nose daily.          BP 177/109  Pulse 77  Temp(Src) 98.7 F (37.1 C) (Oral)  Resp 18  SpO2 95% Physical Exam  Constitutional: He is oriented to person, place, and time. He appears well-developed and well-nourished.  HENT:  Head: Normocephalic and atraumatic.  Eyes: EOM are normal. Pupils are equal, round, and reactive to light.  Neck: Neck supple.  Cardiovascular: Normal rate, regular rhythm and intact distal pulses.   Pulmonary/Chest: Effort normal and breath sounds normal. No respiratory distress. He exhibits no tenderness.  Abdominal: Soft. There is no tenderness.  Musculoskeletal: Normal range of motion. He exhibits no edema and no tenderness.  Neurological: He is alert and oriented to person, place, and time.  Skin: Skin is warm and dry.  ED Course   Procedures (including critical care time)  Results for orders placed during the hospital encounter of 12/18/12  MRSA PCR SCREENING      Result Value Range   MRSA by PCR NEGATIVE  NEGATIVE  CBC      Result Value Range   WBC 8.3  4.0 - 10.5 K/uL   RBC 4.86  4.22 - 5.81 MIL/uL   Hemoglobin 15.0  13.0 - 17.0 g/dL   HCT 95.2  84.1 - 32.4 %   MCV 89.1  78.0 - 100.0 fL   MCH 30.9  26.0 - 34.0 pg   MCHC 34.6  30.0 - 36.0 g/dL   RDW 40.1  02.7 - 25.3 %   Platelets 196  150 - 400 K/uL  BASIC METABOLIC PANEL      Result Value Range   Sodium 140  135 - 145 mEq/L   Potassium 3.9  3.5 - 5.1 mEq/L   Chloride 101  96 - 112 mEq/L   CO2 30  19 - 32 mEq/L   Glucose, Bld 133 (*) 70 - 99 mg/dL   BUN 24 (*) 6 - 23 mg/dL   Creatinine, Ser 6.64  0.50 -  1.35 mg/dL   Calcium 9.4  8.4 - 40.3 mg/dL   GFR calc non Af Amer 64 (*) >90 mL/min   GFR calc Af Amer 74 (*) >90 mL/min  PROTIME-INR      Result Value Range   Prothrombin Time 13.3  11.6 - 15.2 seconds   INR 1.03  0.00 - 1.49  CBC      Result Value Range   WBC 11.6 (*) 4.0 - 10.5 K/uL   RBC 4.32  4.22 - 5.81 MIL/uL   Hemoglobin 13.3  13.0 - 17.0 g/dL   HCT 47.4 (*) 25.9 - 56.3 %   MCV 89.1  78.0 - 100.0 fL   MCH 30.8  26.0 - 34.0 pg   MCHC 34.5  30.0 - 36.0 g/dL   RDW 87.5  64.3 - 32.9 %   Platelets 193  150 - 400 K/uL  TROPONIN I      Result Value Range   Troponin I >20.00 (*) <0.30 ng/mL  POCT I-STAT TROPONIN I      Result Value Range   Troponin i, poc 0.42 (*) 0.00 - 0.08 ng/mL   Comment NOTIFIED PHYSICIAN     Comment 3           POCT ACTIVATED CLOTTING TIME      Result Value Range   Activated Clotting Time 145     Dg Chest Port 1 View  12/18/2012   *RADIOLOGY REPORT*  Clinical Data: Chest pain  PORTABLE CHEST - 1 VIEW  Comparison: Prior radiograph 07/06/2012  Findings: Mediasternotomy wires of underlying CABG markers and surgical clips are again noted, unchanged. Cardiomegaly is stable.  The lungs are mildly hypoinflated.  There is perihilar vascular congestion without frank pulmonary edema.  No pleural effusion or pneumothorax.  No airspace consolidation.  Osseous structures are within normal limits.  IMPRESSION: Cardiomegaly with mild pulmonary vascular congestion, without frank pulmonary edema.   Original Report Authenticated By: Rise Mu, M.D.    CRITICAL CARE Performed by: Sunnie Nielsen Total critical care time: 30 Critical care time was exclusive of separately billable procedures and treating other patients. Critical care was necessary to treat or prevent imminent or life-threatening deterioration. Critical care was time spent personally by me on the following activities: development of treatment plan with patient and/or  surrogate as well as nursing,  discussions with consultants, evaluation of patient's response to treatment, examination of patient, obtaining history from patient or surrogate, ordering and performing treatments and interventions, ordering and review of laboratory studies, ordering and review of radiographic studies, pulse oximetry and re-evaluation of patient's condition.    6:08 AM EKG reviewed and code stemi called. Cardiology fellow notified and will evaluate bedside   Date: 12/18/2012  Rate: 76  Rhythm: normal sinus rhythm  QRS Axis: normal  Intervals: PR prolonged  ST/T Wave abnormalities: ST depressions anteriorly  Conduction Disutrbances:first-degree A-V block   Narrative Interpretation: Sinus with significant ST depressions V1 through V5 and some ST elevations 2, 3 and aVF  Old EKG Reviewed: changes noted - new changes as above compared prior  Aspirin nitroglycerin provided. Heparin bolus ordered  6:17 AM discussed with Dr. Tresa Endo, plan Cath Lab  MDM  Chest pain/code STEMI   EKG. Labs. Chest x-ray.  Aspirin. Nitroglycerin. Heparin.  Cardiology admit Cath Lab       Sunnie Nielsen, MD 12/18/12 [phone]

## 2012-12-18 NOTE — ED Notes (Signed)
Pt took 325mg  of home aspirin, EDP at bedside to approve.

## 2012-12-18 NOTE — Plan of Care (Signed)
Problem: Consults Goal: Chest Pain Patient Education (See Patient Education module for education specifics.) Outcome: Progressing Verbal teaching done . Video and booklet to be given

## 2012-12-18 NOTE — ED Notes (Signed)
EDP at bedside, Cardiology consult down and agreed to called Code STEMI and cath with MD Tresa Endo.

## 2012-12-18 NOTE — H&P (Signed)
Chief Complaint: Chest Pain  HPI: The patient is a 72 y/o male with known CAD, s/p CABG by Dr. Tyrone Sage in 1999 who presented to [facility] today with a STEMI. He apparently developed severe resting chest pain this morning was was brought to the ER, where an EKG confirmed ST elevations. He endorsed associated SOB. No syncope/presyncope.  Past Medical History  Diagnosis Date  . Coronary artery disease   . Ruptured lumbar disc   . Arthritis   . High triglycerides     Past Surgical History  Procedure Laterality Date  . Coronary artery bypass graft    . Lumbar disc surgery    . Tonsillectomy      Family History  Problem Relation Age of Onset  . CAD Mother   . CAD Brother   . CAD Sister    Social History:  reports that he does not drink alcohol or use illicit drugs. His tobacco history is not on file.  Allergies:  Allergies  Allergen Reactions  . Aspirin Nausea And Vomiting    Can't take full strength uncoated aspirin  . Other Other (See Comments)    Pain medication that he was given after open heart surgery-sweating and hallucinations     Medications Prior to Admission  Medication Sig Dispense Refill  . aspirin 325 MG EC tablet Take 325 mg by mouth daily.      Marland Kitchen gemfibrozil (LOPID) 600 MG tablet Take 600 mg by mouth 2 (two) times daily before a meal.      . lisinopril (PRINIVIL,ZESTRIL) 5 MG tablet Take 5 mg by mouth at bedtime.      . metoprolol tartrate (LOPRESSOR) 25 MG tablet Take 12.5 mg by mouth 2 (two) times daily.      . ranitidine (ZANTAC) 150 MG tablet Take 150 mg by mouth 2 (two) times daily.      Marland Kitchen triamcinolone (NASACORT) 55 MCG/ACT nasal inhaler Place 2 sprays into the nose daily.        Results for orders placed during the hospital encounter of 12/18/12 (from the past 48 hour(s))  CBC     Status: None   Collection Time    12/18/12  6:13 AM      Result Value Range   WBC 8.3  4.0 - 10.5 K/uL   RBC 4.86  4.22 - 5.81 MIL/uL   Hemoglobin 15.0  13.0 - 17.0 g/dL    HCT 16.1  09.6 - 04.5 %   MCV 89.1  78.0 - 100.0 fL   MCH 30.9  26.0 - 34.0 pg   MCHC 34.6  30.0 - 36.0 g/dL   RDW 40.9  81.1 - 91.4 %   Platelets 196  150 - 400 K/uL  BASIC METABOLIC PANEL     Status: Abnormal   Collection Time    12/18/12  6:13 AM      Result Value Range   Sodium 140  135 - 145 mEq/L   Potassium 3.9  3.5 - 5.1 mEq/L   Chloride 101  96 - 112 mEq/L   CO2 30  19 - 32 mEq/L   Glucose, Bld 133 (*) 70 - 99 mg/dL   BUN 24 (*) 6 - 23 mg/dL   Creatinine, Ser 7.82  0.50 - 1.35 mg/dL   Calcium 9.4  8.4 - 95.6 mg/dL   GFR calc non Af Amer 64 (*) >90 mL/min   GFR calc Af Amer 74 (*) >90 mL/min   Comment:  The eGFR has been calculated     using the CKD EPI equation.     This calculation has not been     validated in all clinical     situations.     eGFR's persistently     <90 mL/min signify     possible Chronic Kidney Disease.  PROTIME-INR     Status: None   Collection Time    12/18/12  6:13 AM      Result Value Range   Prothrombin Time 13.3  11.6 - 15.2 seconds   INR 1.03  0.00 - 1.49  POCT I-STAT TROPONIN I     Status: Abnormal   Collection Time    12/18/12  6:15 AM      Result Value Range   Troponin i, poc 0.42 (*) 0.00 - 0.08 ng/mL   Comment NOTIFIED PHYSICIAN     Comment 3            Comment: Due to the release kinetics of cTnI,     a negative result within the first hours     of the onset of symptoms does not rule out     myocardial infarction with certainty.     If myocardial infarction is still suspected,     repeat the test at appropriate intervals.   Dg Chest Port 1 View  12/18/2012   *RADIOLOGY REPORT*  Clinical Data: Chest pain  PORTABLE CHEST - 1 VIEW  Comparison: Prior radiograph 07/06/2012  Findings: Mediasternotomy wires of underlying CABG markers and surgical clips are again noted, unchanged. Cardiomegaly is stable.  The lungs are mildly hypoinflated.  There is perihilar vascular congestion without frank pulmonary edema.  No pleural  effusion or pneumothorax.  No airspace consolidation.  Osseous structures are within normal limits.  IMPRESSION: Cardiomegaly with mild pulmonary vascular congestion, without frank pulmonary edema.   Original Report Authenticated By: Rise Mu, M.D.    Review of Systems  Respiratory: Positive for shortness of breath.   Cardiovascular: Positive for chest pain.  All other systems reviewed and are negative.    Blood pressure 121/84, pulse 67, temperature 98.5 F (36.9 C), temperature source Oral, resp. rate 18, height 6' (1.829 m), weight 281 lb 1.4 oz (127.5 kg), SpO2 100.00%. Physical Exam  Constitutional: He is oriented to person, place, and time. He appears well-developed and well-nourished.  Cardiovascular: Normal rate, regular rhythm, normal heart sounds and intact distal pulses.  Exam reveals no gallop and no friction rub.   No murmur heard. Respiratory: Effort normal and breath sounds normal. No respiratory distress. He has no rales. He exhibits no tenderness.  GI: Soft. Bowel sounds are normal. He exhibits no distension and no mass. There is no tenderness.  Musculoskeletal: He exhibits no edema.  Neurological: He is alert and oriented to person, place, and time.  Skin: Skin is warm and dry.  Psychiatric: He has a normal mood and affect. His behavior is normal.     Assessment/Plan Principal Problem:   STEMI (ST elevation myocardial infarction) Active Problems:   CAD (coronary artery disease) of artery bypass graft  Plan: The patient was taken urgently to the cath lab for PCI. Cardiologist was Dr. Tresa Endo. The culprit lesion was occlusion of SVG to distal RCA with no flow proximal due to anastomis occlusion with thrombus. He underwent PCI with PTCA/ thrombectomy of the occluded vessel with a DES. Will admit to ICU. He is on ASA + Brilinta. His CP has resolved. Vitals are stable. Will continue  to monitor.   Allayne Butcher, PA-C 12/18/2012, 11:14 AM   Patient seen  and examined. Agree with assessment and plan. 71 yo WM who is 15 yrs s/p CABGx5 by Dr Tyrone Sage. 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 He first noted mild discomfort 2 days ago, but at 2 am developed significant chest burning which persisted for several hrs. He presented to [facility] ER where Code Stemi called due to inferior ST elevation and precordial ST depresion V1 -4. Emergency cath performed. Pt was found to have probable old occlusion of grafts to Dx and Lcx, and fresh thrombus occlusion of SVG to RCA. [place] to LAD was patent and diagonal vessel was well collateralized from the distal LAD. Very difficult PCI was successful requiring extensive thrombectomy and verapamil/ ntg needed for no flow phenomenom. Currently pain free post procedure in CCU on angiomax/integrelin/NTG.    [name], MD, [facility] 12/18/2012 11:57 AM

## 2012-12-18 NOTE — CV Procedure (Addendum)
Emergent Cardiac Catheterization/ PCI? Thrombectomy, PTCA/stent of SVG to RCA  Ryan Wise, 72 y.o., male  Full note dictated;  See diagram in chart  DICTATION # [id number]; [id number]  LM: 40% smooth distal stenosis LAD: occluded at origen INT: patent LCX: 90% OM 2 at origen RCA: diffusely irregular with 80 - 90% proximal stenosis, 60% mid, 40% before crux supplies PDA, the occluded continuation branch  LIMA: patent to LAD which collateralizes diagonal vessel SVG- OM:  Occluded SVG- DX: occluded Culprit occlusion of SVG to distal RCA with no flow proximal due to anastomis occlusion with thrombus.  Very difficult PCI  With PTCA/ thrombectomy, no flow phenomenon requiring IC/IV NTG/IC verapamil/ angiomax, brilinta 180 mg, integrelin, 2.0 x 12 Sprinter, 2.5 x 15 Emerge, Priority One AC Thrombectomy, 2.25 x 18 Xience Xpedition stent to distal anastomosis, 2.5 [place] Quantum.  Restoration of Timi 2 - 3 flow with 0 residual narrrowing at anastomosis in small continuation branch which was dilated proximally. Delay to access to device due to difficulty crossing lesion.  LV: EF 35% with severe hypo - AK on inferior wall.  [name], MD, [facility] 12/18/2012 10:06 AM

## 2012-12-18 NOTE — Cardiovascular Report (Signed)
[name], [name]:  [id number]  MEDICAL RECORD NO.:  [id number]  LOCATION:  2902                         FACILITY:  [facility]  PHYSICIAN:  Nicki Guadalajara, M.D.     DATE OF BIRTH:  1940/11/15  DATE OF PROCEDURE: DATE OF DISCHARGE:                           CARDIAC CATHETERIZATION   PROCEDURE:  Emergent cardiac catheterization:  PTCA and stenting for the saphenous vein graft to the right coronary artery.  Left heart catheterization with cine coronary angiography; selective angiography into saphenous vein grafts; selective angiography into the left internal mammary artery; very difficult percutaneous coronary intervention to totally occluded vein graft to the distal right coronary artery requiring thrombectomy, PTCA, and stenting.  INDICATIONS:  [name] is a 72 year old patient of Dr. Rennis Golden, who has established coronary artery disease and underwent CABG revascularization surgery x5 in November 1999 by Dr. Tyrone Sage.  I do not have the specifics of his grafts.  He states approximately 2 days ago, he started to notice some chest burning.  At approximately 2 a.m. this morning, he developed recurrent episodes of chest burning.  This lasted for approximately 4 hours and ultimately he presented to [facility] emergency room, where a code STEMI was called with 1 mm ST-elevation in leads 2, 3, and F and 2 mm of precordial ST-segment depression V1 through V4. The patient was taken acutely to the cardiac catheterization laboratory.  PROCEDURE:  This was a very difficult procedure.  Right femoral artery was punctured anteriorly and a 6-French sheath was inserted without difficulty.  The infrarenal aorta appeared somewhat tortuous and the right catheter was necessary to navigate the wire beyond this segment. With the right catheter inserted initially, initial angiographic studies were done with this catheter to the native right coronary artery to 3 vein grafts.  There  was suggestion that the vein graft to the right coronary artery was the culprit graft occlusion since there was TIMI 0 flow in the proximal 3rd, but it did not appear that this was the site of occlusion and most likely this was more distally.  Attempts were then made to selectively cannulate the left internal mammary artery and a LIMA catheter was necessary for this.  All exchanges were done with a long wire.  Attention was then directed at the native left system and initially an FL4 6-French catheter was inserted.  This was unable to selectively engage the left main and this was then exchanged for an FL5 left catheter with excellent visualization of the left system.  At this point, the patient was still experiencing chest burning.  He had received 4000 units of heparin in the emergency room.  Angiomax bolus plus infusion was started.  Attention was then directed to try to reopen the probable culprit graft occlusion to the distal right coronary artery.  ACT was documented to be therapeutic.  A ChoICE PT moderate support wire was able to be inserted into the SVG to the RCA.  This was able to cross the initial site where flow seemed to stop, but it did not seem that this was the site of occlusion.  There was significant difficulty in passing the wire beyond which appeared to be the  distal anastomosis site.  Initial low level inflation with a 2.5 x 15 mm balloon was made at the site of the initial occlusion and several additional sites beyond.  The catheter was then removed and a PriorityOne AC thrombectomy catheter was inserted.  Two runs were made without significant thrombus removal.  The balloon was then reinserted and ultimately with the balloon support, the wire was able to pass the total distal occlusion into the distal vessel.  However, there was still no flow and the size of the distal vessel, although suspected to be very small was not known.  Consequently, the Emerge balloon was  then exchanged for a Sprinter Legend 2.0 x12 mm and several inflations were made at the anastomosis site and slightly beyond into the more distal native RCA beyond the anastomosis.  The 2.0 x15 Emerge balloon was then reinserted and multiple inflations again were made at the anastomosis site and then several additional inflations proximal to this.  Again there continues to be very slow flow.  The catheter was then removed and the thrombectomy catheter was then reinserted and several additional runs were made with thrombus removal.  The patient also received numerous doses of intracoronary nitroglycerin and also received intracoronary verapamil to help improve the no flow phenomenon. Ultimately some slight flow was reestablished.  Multiple dilatations were made with the 2.5 x 15 mm Emerge balloon in the distal aspect of the vein graft proximal to the anastomosis site.  The Emerge balloon was then removed and a 2.25 x 18 mm Xience Xpedition DES stent was then inserted at the anastomosis with portion extending into the distal native right coronary artery.  Two inflations at 10 atmospheres were made.  The noncompliant Quantum apex balloon 2.5 x 15 mm was then used for post stent dilatation within the stented segment.  Again, the patient received several additional doses of IC nitro as well as IV verapamil.  He also had been started on single bolus Integrilin plus drip in combination to his Angiomax.  In addition, prior to the stent insertion, the patient did receive Brilinta 180 mg orally.  Scout angiography ultimately demonstrated patent vein graft with a stent in the distal aspect at the anastomosis with 2 small side branches arising from this and the distal RCA now visualized, but again very small caliber and extending apparently to the apex.  With a long exchange wire, the catheters were removed and a pigtail catheter was then advanced into the LV and RAO ventriculography was performed.   An LV to AO pullback was performed.  At this point, the patient was entirely pain free.  He had stable hemodynamics.  The decision was made to continue him on reduced dose Angiomax for 4 hours, and continue him on Integrilin as well as IV nitroglycerin which had been started at the beginning of the procedure.  He was transported to the coronary care unit with stable hemodynamics pain free, in no distress.  HEMODYNAMIC DATA:  Initial central aortic pressure was 136/82.  On LV pullback, left ventricular pressure was approximately 120/15, LAO pressure 120/83.  ANGIOGRAPHIC DATA:  Left main coronary artery was a long vessel, which bifurcated into an apparent ramus intermediate like vessel and left circumflex vessel.  There was 40% smooth distal left main tapering.  The LAD was totally occluded at its origin arising from the left main  The ramus intermediate vessel was moderate-sized vessel free of significant disease.  The circumflex vessel gave rise to very high marginal bifurcating vessel.  There was a more distal OM vessel which seemed to be a site where the graft had gone into.  This had diffuse 90+ percent narrowing arising from the AV groove circumflex.  Visualization of the vein graft was not apparent retrograde.  The native right coronary artery was diffusely irregular and had 80-90% proximal stenosis before the anterior RV marginal branch.  There was diffuse 60% mid stenosis, 40% stenosis in the region of the crux.  This ended in a PDA vessel.  The vessel was then occluded.  The continuation branch of the RCA was occluded beyond the PDA takeoff.  The LIMA graft supplied the mid LAD and was free of significant disease. The LAD beyond the graft was free of significant disease and extensively collateralized to retrograde the diagonal vessel proximally.  The vein graft that supplied the circumflex vessel was occluded.  The vein graft which most likely supplied the diagonal  vessel was occluded.  The vein graft which supplied the right coronary artery had flown in the very proximal segment and then there was 0 flow.  Following a very difficult intervention requiring extensive runs of thrombectomy, IC and IV nitroglycerin, IC verapamil for no flow, PTCA, thrombectomy, and ultimate stenting at the distal anastomosis site with a 2.25 x 18 mm Xience DES stent post dilated with a 2.5 x 15 mm quantum apex balloon to approximately 2.38 mm, the distal anastomosis site was reduced from 100% to 0%.  The initial proximal site at the beginning of the graft was not the site of stenosis, but essentially there was significant clot burden all throughout which underwent thrombectomy.  At the completion of the procedure, the vein graft was open with reestablishment of TIMI 2-3 flow.  The distal RCA beyond the anastomosis was very small caliber.  The stented segment was reduced to 0%.  RAO ventriculography revealed an ejection fraction of approximately 35%. There was severe hypokinesis of the inferior wall.  IMPRESSION: 1. ST-segment elevation myocardial infarction due to acute graft     occlusion of the vein graft supplying the distal right coronary     artery. 2. Severe multivessel native coronary artery disease with 40% distal     left main stenosis, total occlusion of the ostium of the left     anterior descending, 90% stenosis in the OM2 branch, which seems to     be the branch which had graft placed, severe and diffuse native     right coronary artery disease with 80-90% proximal stenosis, 60%     mid stenosis, 40% distal stenosis, and total occlusion of the     continuation branch after the posterior descending artery takeoff. 3. Patent left internal mammary artery supplying the left anterior     descending and also extensively collateralizing the diagonal     vessel. 4. Occluded graft to the circumflex marginal vessel. 5. Occluded graft to the diagonal  vessel. 6. Occluded graft which most likely is the culprit occluded graft     supplying the distal right coronary artery, requiring difficult     percutaneous intervention with percutaneous transluminal coronary     angioplasty, extensive runs of thrombectomy, and ultimate stenting     over the distal anastomosis site with a 2.25 x 18 mm Xience     Xpedition DES stent post dilated 2.38 mm with restoration of flow 7. Angiomax, 180 mg Brilinta, IC and IV nitroglycerin and IC     verapamil.          ______________________________  Nicki Guadalajara, M.D.     TK/MEDQ  D:  12/18/2012  T:  12/18/2012  Job:  [id number]

## 2012-12-18 NOTE — Progress Notes (Signed)
CRITICAL VALUE ALERT  Critical value received:  Troponin >20  Date of notification:  12/18/2012  Time of notification:  2235  Critical value read back:yes  Nurse who received alert:  G.Mayford Knife RN  MD notified (1st page):  Dr Skeet Latch  Time of first page:  2245  MD notified (2nd page):  Time of second page:  Responding MD:  Dr Skeet Latch  Time MD responded:  2245

## 2012-12-18 NOTE — ED Notes (Signed)
Pt complains of chest pain since Tuesday, sts comes and goes.

## 2012-12-18 NOTE — Plan of Care (Signed)
Problem: Consults Goal: Tobacco Cessation referral if indicated Outcome: Completed/Met Date Met:  12/18/12 Quit 25 yrs ago Goal: Nutrition Consult-if indicated Outcome: Completed/Met Date Met:  12/18/12 "I eat 6 lbs of fruit a week,also sometimes I eat just a salad for a meal ."  Problem: Phase I Progression Outcomes Goal: Anginal pain relieved Outcome: Progressing Denies : pain,pressure,tightness,dizziness,SOB,nausea,numbness and tingling Goal: Voiding-avoid urinary catheter unless indicated Outcome: Not Progressing Was unable to void .Foley cath placed .( 1000 ml uop returned).

## 2012-12-18 NOTE — CV Procedure (Deleted)
Emergency Catheterization/PCI of LAD  Kerby Moors, 72 y.o., male  Full note dictated; see diagram in chart  DICTATION # [id number]  Ao: 152/79 LV: 152/21/31  LM: nl LAD: 60 - 70% ostial, 70 and 80 % proximal stenosis before SP1 and totally occluded post septal with TIMI  0 flow. LCX: 95% mid AV groove between OM1 and OM2 RCA: large dominant vessel with 50 - 60% proximal stenosis and 30% mid stenosis.  PCI of LAD: 6 F XB 3.5 LAD guide, Choice PT wire, 2.5 x 15 Sprinter balloon, and tandem 3.0 x 38 and 3.0 x 16 Promus Premier DES stents post dilated with 3.25 x 20 [place] from mid LAD to ostium with all lesions reduced to 0% and resumption of TIMI 3 flow.   LV fxn: EF 35 - 405 with severe hypo-ak of mid-distal anterolateral wall extending around apex to inferoapical segment with possible focal apical dyskinesis.  DTB time from cath lab arrival: 27 minutes  Angiomax, 180 mg brilinta, NTG.   May need staged PCI to LCX lesion.  [name], MD, [facility] 12/18/2012 10:47 PM

## 2012-12-18 NOTE — ED Notes (Signed)
Cath Lab ready, RN, NT transferred pt on zoll to cath lab. Pt family in short stay waiting.

## 2012-12-18 NOTE — Progress Notes (Signed)
Rt groin atrterial sheath d/c'd at 1630p per protocol.Procedure had again been reviewed w/Pt and family prior to sheath removal ..Manual pressure held to site x 30 min then pressure drsg applied. Pt denies : c/p,pressure,tightness,SOB,nausea ,numbness and tingling. C/o mild h/a yet,again, refused tylenol .VSS .

## 2012-12-19 DIAGNOSIS — I25810 Atherosclerosis of coronary artery bypass graft(s) without angina pectoris: Secondary | ICD-10-CM

## 2012-12-19 DIAGNOSIS — I219 Acute myocardial infarction, unspecified: Secondary | ICD-10-CM

## 2012-12-19 LAB — BASIC METABOLIC PANEL
BUN: 18 mg/dL (ref 6–23)
CO2: 25 mEq/L (ref 19–32)
Calcium: 9.1 mg/dL (ref 8.4–10.5)
Chloride: 95 mEq/L — ABNORMAL LOW (ref 96–112)
Creatinine, Ser: 1.16 mg/dL (ref 0.50–1.35)

## 2012-12-19 LAB — CBC
HCT: 40.3 % (ref 39.0–52.0)
MCH: 30.9 pg (ref 26.0–34.0)
MCV: 88.4 fL (ref 78.0–100.0)
Platelets: 211 10*3/uL (ref 150–400)
RDW: 13.2 % (ref 11.5–15.5)

## 2012-12-19 MED ORDER — FUROSEMIDE 20 MG PO TABS
20.0000 mg | ORAL_TABLET | Freq: Every day | ORAL | Status: DC
  Administered 2012-12-19 – 2012-12-22 (×4): 20 mg via ORAL
  Filled 2012-12-19 (×5): qty 1

## 2012-12-19 NOTE — Progress Notes (Addendum)
DAILY PROGRESS NOTE  Subjective:  No events overnight. BP stable. Mild pink tinge to his urine.   Objective:  Temp:  [98.4 F (36.9 C)-98.9 F (37.2 C)] 98.9 F (37.2 C) (07/27 0400) Pulse Rate:  [66-88] 74 (07/27 0700) Resp:  [9-26] 23 (07/27 0700) BP: (99-152)/(52-99) 99/57 mmHg (07/27 0600) SpO2:  [93 %-100 %] 94 % (07/27 0700) Arterial Line BP: (121-161)/(63-83) 126/65 mmHg (07/26 1620) Weight:  [281 lb 1.4 oz (127.5 kg)] 281 lb 1.4 oz (127.5 kg) (07/26 1000) Weight change: 6 lb 1.4 oz (2.761 kg)  Intake/Output from previous day: 07/26 0701 - 07/27 0700 In: 2510.2 [P.O.:180; I.V.:2330.2] Out: 5915 [Urine:5915]  Intake/Output from this shift:    Medications: Current Facility-Administered Medications  Medication Dose Route Frequency Provider Last Rate Last Dose  . 0.9 %  sodium chloride infusion   Intravenous Continuous [name], MD 10 mL/hr at 12/18/12 2100    . acetaminophen (TYLENOL) tablet 650 mg  650 mg Oral Q4H PRN [name], MD   650 mg at 12/18/12 1813  . ALPRAZolam Prudy Feeler) tablet 0.5 mg  0.5 mg Oral TID PRN Brittainy Simmons, PA-C   0.5 mg at 12/18/12 1333  . aspirin chewable tablet 324 mg  324 mg Oral Once Sunnie Nielsen, MD      . aspirin EC tablet 81 mg  81 mg Oral Daily [name], MD      . atorvastatin (LIPITOR) tablet 80 mg  80 mg Oral q1800 [name], MD   80 mg at 12/18/12 1813  . eptifibatide (INTEGRILIN) 75 mg / 100 mL infusion  2 mcg/kg/min (Order-Specific) Intravenous Continuous Dannielle Karvonen Bayfield, RPH 20 mL/hr at 12/19/12 1610 2 mcg/kg/min at 12/19/12 9604  . famotidine (PEPCID) tablet 20 mg  20 mg Oral Daily Brittainy Simmons, PA-C   20 mg at 12/18/12 1255  . fluticasone (FLONASE) 50 MCG/ACT nasal spray 2 spray  2 spray Each Nare Daily Brittainy Simmons, PA-C      . furosemide (LASIX) 10 MG/ML injection           . furosemide (LASIX) injection 20 mg  20 mg Intravenous Daily [name], MD   20 mg at 12/18/12 2000  .  LORazepam (ATIVAN) tablet 0.5 mg  0.5 mg Oral Q4H PRN [name], MD   0.5 mg at 12/18/12 2144  . metoprolol tartrate (LOPRESSOR) tablet 12.5 mg  12.5 mg Oral BID Brittainy Simmons, PA-C   12.5 mg at 12/18/12 2144  . morphine 2 MG/ML injection 2 mg  2 mg Intravenous Q4H PRN Brittainy Simmons, PA-C   2 mg at 12/18/12 1519  . nitroGLYCERIN (NITROSTAT) SL tablet 0.4 mg  0.4 mg Sublingual Q5 min PRN Sunnie Nielsen, MD   0.4 mg at 12/18/12 5409  . nitroGLYCERIN 0.2 mg/mL in dextrose 5 % infusion  2-200 mcg/min Intravenous Continuous [name], MD   5 mcg/min at 12/18/12 2200  . ondansetron (ZOFRAN) injection 4 mg  4 mg Intravenous Q6H PRN [name], MD      . Ticagrelor [facility]) tablet 90 mg  90 mg Oral BID [name], MD   90 mg at 12/18/12 2147    Physical Exam: General appearance: alert and no distress Neck: no adenopathy, no carotid bruit, no JVD, supple, symmetrical, trachea midline and thyroid not enlarged, symmetric, no tenderness/mass/nodules Lungs: clear to auscultation bilaterally Heart: regular rate and rhythm, S1, S2 normal, no murmur, click, rub or gallop Abdomen: soft, non-tender;  bowel sounds normal; no masses,  no organomegaly Extremities: extremities normal, atraumatic, no cyanosis or edema Pulses: 2+ and symmetric Skin: Skin color, texture, turgor normal. No rashes or lesions Neurologic: Grossly normal  Lab Results: Results for orders placed during the hospital encounter of 12/18/12 (from the past 48 hour(s))  CBC     Status: None   Collection Time    12/18/12  6:13 AM      Result Value Range   WBC 8.3  4.0 - 10.5 K/uL   RBC 4.86  4.22 - 5.81 MIL/uL   Hemoglobin 15.0  13.0 - 17.0 g/dL   HCT 47.8  29.5 - 62.1 %   MCV 89.1  78.0 - 100.0 fL   MCH 30.9  26.0 - 34.0 pg   MCHC 34.6  30.0 - 36.0 g/dL   RDW 30.8  65.7 - 84.6 %   Platelets 196  150 - 400 K/uL  BASIC METABOLIC PANEL     Status: Abnormal   Collection Time    12/18/12  6:13 AM      Result Value  Range   Sodium 140  135 - 145 mEq/L   Potassium 3.9  3.5 - 5.1 mEq/L   Chloride 101  96 - 112 mEq/L   CO2 30  19 - 32 mEq/L   Glucose, Bld 133 (*) 70 - 99 mg/dL   BUN 24 (*) 6 - 23 mg/dL   Creatinine, Ser 9.62  0.50 - 1.35 mg/dL   Calcium 9.4  8.4 - 95.2 mg/dL   GFR calc non Af Amer 64 (*) >90 mL/min   GFR calc Af Amer 74 (*) >90 mL/min   Comment:            The eGFR has been calculated     using the CKD EPI equation.     This calculation has not been     validated in all clinical     situations.     eGFR's persistently     <90 mL/min signify     possible Chronic Kidney Disease.  PROTIME-INR     Status: None   Collection Time    12/18/12  6:13 AM      Result Value Range   Prothrombin Time 13.3  11.6 - 15.2 seconds   INR 1.03  0.00 - 1.49  POCT I-STAT TROPONIN I     Status: Abnormal   Collection Time    12/18/12  6:15 AM      Result Value Range   Troponin i, poc 0.42 (*) 0.00 - 0.08 ng/mL   Comment NOTIFIED PHYSICIAN     Comment 3            Comment: Due to the release kinetics of cTnI,     a negative result within the first hours     of the onset of symptoms does not rule out     myocardial infarction with certainty.     If myocardial infarction is still suspected,     repeat the test at appropriate intervals.  MRSA PCR SCREENING     Status: None   Collection Time    12/18/12 10:15 AM      Result Value Range   MRSA by PCR NEGATIVE  NEGATIVE   Comment:            The GeneXpert MRSA Assay (FDA     approved for NASAL specimens     only), is one component of a  comprehensive MRSA colonization     surveillance program. It is not     intended to diagnose MRSA     infection nor to guide or     monitor treatment for     MRSA infections.  POCT ACTIVATED CLOTTING TIME     Status: None   Collection Time    12/18/12  3:39 PM      Result Value Range   Activated Clotting Time 145    CBC     Status: Abnormal   Collection Time    12/18/12  6:29 PM      Result Value  Range   WBC 11.6 (*) 4.0 - 10.5 K/uL   RBC 4.32  4.22 - 5.81 MIL/uL   Hemoglobin 13.3  13.0 - 17.0 g/dL   HCT 29.5 (*) 62.1 - 30.8 %   MCV 89.1  78.0 - 100.0 fL   MCH 30.8  26.0 - 34.0 pg   MCHC 34.5  30.0 - 36.0 g/dL   RDW 65.7  84.6 - 96.2 %   Platelets 193  150 - 400 K/uL  TROPONIN I     Status: Abnormal   Collection Time    12/18/12  9:19 PM      Result Value Range   Troponin I >20.00 (*) <0.30 ng/mL   Comment: CRITICAL RESULT CALLED TO, READ BACK BY AND VERIFIED WITH:     TURNER G,RN 12/18/12 2235 WAYK  TROPONIN I     Status: Abnormal   Collection Time    12/19/12  1:23 AM      Result Value Range   Troponin I >20.00 (*) <0.30 ng/mL   Comment: CRITICAL VALUE NOTED.  VALUE IS CONSISTENT WITH PREVIOUSLY REPORTED AND CALLED VALUE.    Imaging: Dg Chest Port 1 View  12/18/2012   *RADIOLOGY REPORT*  Clinical Data: Chest pain  PORTABLE CHEST - 1 VIEW  Comparison: Prior radiograph 07/06/2012  Findings: Mediasternotomy wires of underlying CABG markers and surgical clips are again noted, unchanged. Cardiomegaly is stable.  The lungs are mildly hypoinflated.  There is perihilar vascular congestion without frank pulmonary edema.  No pleural effusion or pneumothorax.  No airspace consolidation.  Osseous structures are within normal limits.  IMPRESSION: Cardiomegaly with mild pulmonary vascular congestion, without frank pulmonary edema.   Original Report Authenticated By: Rise Mu, M.D.    Assessment:  1. Principal Problem: 2.   STEMI (ST elevation myocardial infarction) 3. Active Problems: 4.   CAD (coronary artery disease) of artery bypass graft 5. HPL  Plan:  1. Doing well without chest pain complaints. Mild blood tinge in the urine. Stop integrillin today. Mobilize. Transfer to stepdown. Labs pending, want to show down-trending troponin. Probably home in am tomorrow. Change lasix to po today. Check BNP in am.  Time Spent Directly with Patient:  15 minutes  Length of  Stay:  LOS: 1 day   Chrystie Nose, MD, [facility] Attending Cardiologist [facility]  HILTY,Kenneth C 12/19/2012, 8:04 AM

## 2012-12-20 DIAGNOSIS — I5041 Acute combined systolic (congestive) and diastolic (congestive) heart failure: Secondary | ICD-10-CM | POA: Diagnosis present

## 2012-12-20 DIAGNOSIS — E785 Hyperlipidemia, unspecified: Secondary | ICD-10-CM | POA: Diagnosis present

## 2012-12-20 DIAGNOSIS — I517 Cardiomegaly: Secondary | ICD-10-CM

## 2012-12-20 LAB — PRO B NATRIURETIC PEPTIDE: Pro B Natriuretic peptide (BNP): 2005 pg/mL — ABNORMAL HIGH (ref 0–125)

## 2012-12-20 LAB — BASIC METABOLIC PANEL
CO2: 29 mEq/L (ref 19–32)
Calcium: 8.9 mg/dL (ref 8.4–10.5)
Chloride: 99 mEq/L (ref 96–112)
Glucose, Bld: 103 mg/dL — ABNORMAL HIGH (ref 70–99)
Sodium: 137 mEq/L (ref 135–145)

## 2012-12-20 LAB — CBC
HCT: 40.6 % (ref 39.0–52.0)
Hemoglobin: 14 g/dL (ref 13.0–17.0)
MCV: 88.6 fL (ref 78.0–100.0)
RBC: 4.58 MIL/uL (ref 4.22–5.81)
WBC: 9.9 10*3/uL (ref 4.0–10.5)

## 2012-12-20 LAB — TROPONIN I: Troponin I: 12.96 ng/mL (ref <0.30)

## 2012-12-20 MED ORDER — POTASSIUM CHLORIDE CRYS ER 20 MEQ PO TBCR
40.0000 meq | EXTENDED_RELEASE_TABLET | Freq: Once | ORAL | Status: AC
  Administered 2012-12-20: 40 meq via ORAL
  Filled 2012-12-20: qty 2

## 2012-12-20 MED FILL — Sodium Chloride IV Soln 0.9%: INTRAVENOUS | Qty: 50 | Status: AC

## 2012-12-20 NOTE — Progress Notes (Signed)
Echocardiogram 2D Echocardiogram has been performed.  Altan Kraai 12/20/2012, 10:28 AM

## 2012-12-20 NOTE — Progress Notes (Addendum)
Subjective: No CP.  A little SOB.  Feels jittery/nervous.  Objective: Vital signs in last 24 hours: Temp:  [98.4 F (36.9 C)-100 F (37.8 C)] 98.5 F (36.9 C) (07/28 0800) Pulse Rate:  [67] 67 (07/28 0756) Resp:  [19-23] 23 (07/28 0800) BP: (78-157)/(43-89) 98/51 mmHg (07/28 0800) SpO2:  [94 %-98 %] 94 % (07/28 0800) Weight:  [274 lb 11.1 oz (124.6 kg)] 274 lb 11.1 oz (124.6 kg) (07/28 0515) Last BM Date: 12/18/12  Intake/Output from previous day: 07/27 0701 - 07/28 0700 In: 230 [P.O.:200; I.V.:30] Out: 1400 [Urine:1400] Intake/Output this shift: Total I/O In: 240 [P.O.:240] Out: 400 [Urine:400]  Medications Current Facility-Administered Medications  Medication Dose Route Frequency Provider Last Rate Last Dose  . 0.9 %  sodium chloride infusion   Intravenous Continuous [name], MD      . acetaminophen (TYLENOL) tablet 650 mg  650 mg Oral Q4H PRN [name], MD   650 mg at 12/18/12 1813  . ALPRAZolam Prudy Feeler) tablet 0.5 mg  0.5 mg Oral TID PRN Brittainy Simmons, PA-C   0.5 mg at 12/18/12 1333  . aspirin chewable tablet 324 mg  324 mg Oral Once Sunnie Nielsen, MD      . aspirin EC tablet 81 mg  81 mg Oral Daily [name], MD   81 mg at 12/20/12 0917  . atorvastatin (LIPITOR) tablet 80 mg  80 mg Oral q1800 [name], MD   80 mg at 12/19/12 1700  . famotidine (PEPCID) tablet 20 mg  20 mg Oral Daily Brittainy Simmons, PA-C   20 mg at 12/20/12 0917  . fluticasone (FLONASE) 50 MCG/ACT nasal spray 2 spray  2 spray Each Nare Daily Brittainy Simmons, PA-C      . furosemide (LASIX) tablet 20 mg  20 mg Oral Daily Chrystie Nose, MD   20 mg at 12/20/12 1610  . LORazepam (ATIVAN) tablet 0.5 mg  0.5 mg Oral Q4H PRN [name], MD   0.5 mg at 12/20/12 9604  . metoprolol tartrate (LOPRESSOR) tablet 12.5 mg  12.5 mg Oral BID Brittainy Simmons, PA-C   12.5 mg at 12/20/12 0917  . morphine 2 MG/ML injection 2 mg  2 mg Intravenous Q4H PRN Brittainy Simmons, PA-C   2 mg  at 12/18/12 1519  . nitroGLYCERIN (NITROSTAT) SL tablet 0.4 mg  0.4 mg Sublingual Q5 min PRN Sunnie Nielsen, MD   0.4 mg at 12/18/12 5409  . nitroGLYCERIN 0.2 mg/mL in dextrose 5 % infusion  2-200 mcg/min Intravenous Continuous [name], MD   5 mcg/min at 12/18/12 2200  . ondansetron (ZOFRAN) injection 4 mg  4 mg Intravenous Q6H PRN [name], MD      . Ticagrelor [facility]) tablet 90 mg  90 mg Oral BID [name], MD   90 mg at 12/20/12 8119    PE: General appearance: alert, cooperative and no distress Lungs: clear to auscultation bilaterally Heart: regular rate and rhythm, S1, S2 normal, no murmur, click, rub or gallop Extremities: no LEE Pulses: 2+ and symmetric Neurologic: Grossly normal  Lab Results:   Recent Labs  12/18/12 0613 12/18/12 1829 12/19/12 0808  WBC 8.3 11.6* 12.3*  HGB 15.0 13.3 14.1  HCT 43.3 38.5* 40.3  PLT 196 193 211   BMET  Recent Labs  12/18/12 0613 12/19/12 0808 12/20/12 0405  NA 140 132* 137  K 3.9 3.6 3.3*  CL 101 95* 99  CO2 30 25 29  GLUCOSE 133* 128* 103*  BUN 24* 18 22  CREATININE 1.12 1.16 1.37*  CALCIUM 9.4 9.1 8.9   PT/INR  Recent Labs  12/18/12 0613  LABPROT 13.3  INR 1.03    Assessment/Plan   Principal Problem:   STEMI (ST elevation myocardial infarction) Active Problems:   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  LCX with 95% mid AV groove stenosis.   Does this pt need staged PCI?  LV gram 35-40%.  Troponin trending down.    Mild increase in SCr.  Replace K.   2D echo pending.  ASA, lipitor, lasix 20mg  daily, brilinta, lopressor, ativan.  DC home likely tomorrow.  Ok for telemetry.    LOS: 2 days    Valetta Mulroy W 12/20/2012 9:40 AM  I have seen and evaluated the patient this am along with Wilburt Finlay, PA. I agree with HIS findings, examination as well as impression recommendations.  S/P Inf STEMI with prolonged - difficult PCI of SVG-RCA (complicated by no-reflow which makes it more high risk).  proBNP still > 2000, and remains borderline hypotensive -- not sure if he will tolerate even the low dose BB.  Low EF & high proBNP would suggest a component of acute Systolic-Diastolic HF due to STEMI. -- is on lasix & diuresing   ? Some component of RV infarction with hypotension.  Was on ACE-I & BB prior to admission -- may not be able to start until post d/c  Jitteriness may be related to hypotension.  Will need to review films re: Cx lesion - my understanding was that his native Cx was occluded - ? If this is an error.  With mild hematuria yesterday -- will recheck CBC.  I would prefer to monitor him at least one if nor 2 more days to make sure that his BP is stable.  Would watch in TCU today & probably transfer to floor tomorrow with plan to D/c the following day depending on how his BP is going.   MD Time with pt: 10 min  Erendida Wrenn W, M.D., M.S. [facility] [address] [place], [place]  [address]  [phone] Pager # [phone] 12/20/2012 9:40 AM

## 2012-12-20 NOTE — Progress Notes (Signed)
CARDIAC REHAB PHASE I   PRE:  Rate/Rhythm: 83 SR with PVCs  BP:  Sitting: 112/62     SaO2: 96 RA  MODE:  Ambulation: 350 ft   POST:  Rate/Rhythm: 95 SR with PVCs  BP:  Sitting: 137/90    SaO2: 98 RA  Pt walked 35ft with steady gait and c/o of mild SOB, but was able to talk during ambulation and O2 sat ranged from 94-96%.  Reviewed education and NTG use with pt and his daughter.  Pt is interested in CRP II in [place].  [id number]  Marvene Staff MS, ACSM RCEP 2:34 PM 12/20/2012

## 2012-12-20 NOTE — Care Management Note (Addendum)
    Page 1 of 1   12/21/2012     11:59:57 AM   CARE MANAGEMENT NOTE 12/21/2012  Patient:  Ryan Wise,Ryan Wise   Account Number:  [id number]  Date Initiated:  12/20/2012  Documentation initiated by:  Junius Creamer  Subjective/Objective Assessment:   adm w mi     Action/Plan:   lives alone, pcp dr Thayer Headings   Anticipated DC Date:     Anticipated DC Plan:  HOME/SELF CARE      DC Planning Services  CM consult  Medication Assistance      Choice offered to / List presented to:             Status of service:   Medicare Important Message given?   (If response is "NO", the following Medicare IM given date fields will be blank) Date Medicare IM given:   Date Additional Medicare IM given:    Discharge Disposition:  HOME/SELF CARE  Per UR Regulation:  Reviewed for med. necessity/level of care/duration of stay  If discussed at Long Length of Stay Meetings, dates discussed:    Comments:  7/29  1158a debbie Kinzey Sheriff rn,bsn have faxed in brilinta pt assist form and gave form to pt so he can send in proof of income when he gets home.  7/28 2952 debbie Rekha Hobbins rn,bsn gave pt brilinta 30day free and copay assist card. will get cm sec to ck to see what copay will be. pt has no medicare d or coverage for meds. he pays for meds out of pocket. left pt assist form on shadow chart for md to sign. pt aware to ask for form.

## 2012-12-21 ENCOUNTER — Encounter (HOSPITAL_COMMUNITY): Payer: Self-pay | Admitting: Cardiology

## 2012-12-21 DIAGNOSIS — I255 Ischemic cardiomyopathy: Secondary | ICD-10-CM

## 2012-12-21 DIAGNOSIS — I5041 Acute combined systolic (congestive) and diastolic (congestive) heart failure: Secondary | ICD-10-CM

## 2012-12-21 DIAGNOSIS — I2589 Other forms of chronic ischemic heart disease: Secondary | ICD-10-CM

## 2012-12-21 DIAGNOSIS — E785 Hyperlipidemia, unspecified: Secondary | ICD-10-CM

## 2012-12-21 HISTORY — DX: Ischemic cardiomyopathy: I25.5

## 2012-12-21 NOTE — Progress Notes (Signed)
Subjective: Feels weak today, no chest pain, no SOB   Objective: Vital signs in last 24 hours: Temp:  [98.4 F (36.9 C)-98.8 F (37.1 C)] 98.8 F (37.1 C) (07/29 0409) Resp:  [20-25] 22 (07/28 1700) BP: (101-139)/(52-90) 101/52 mmHg (07/29 0409) SpO2:  [94 %-97 %] 94 % (07/29 0409) Weight change:  Last BM Date: 12/20/12 Intake/Output from previous day: -280 (total -4854 since admit) 07/28 0701 - 07/29 0700 In: 1320 [P.O.:1320] Out: 1600 [Urine:1600] Intake/Output this shift:    PE: General:Pleasant affect, NAD Skin:Warm and dry, brisk capillary refill Neck:supple, no JVD Heart:S1S2 RRR without murmur, gallup, rub or click Lungs:clear without rales, rhonchi, or wheezes ZOX:WRUE, non tender, + BS, do not palpate liver spleen or masses Ext:no lower ext edema, 2+ pedal pulses, 2+ radial pulses Neuro:alert and oriented, MAE, follows commands, + facial symmetry   Lab Results:  Recent Labs  12/19/12 0808 12/20/12 0949  WBC 12.3* 9.9  HGB 14.1 14.0  HCT 40.3 40.6  PLT 211 190   BMET  Recent Labs  12/19/12 0808 12/20/12 0405  NA 132* 137  K 3.6 3.3*  CL 95* 99  CO2 25 29  GLUCOSE 128* 103*  BUN 18 22  CREATININE 1.16 1.37*  CALCIUM 9.1 8.9    Recent Labs  12/19/12 0809 12/20/12 0405  TROPONINI >20.00* 12.96*       Studies/Results: 12/20/12 2D Echo Study Conclusions  - Left ventricle: The cavity size was moderately dilated. Wall thickness was moderately increased in a pattern of LVH. There was moderate concentric hypertrophy. Systolic function was moderately reduced. The estimated ejection fraction was in the range of 35% to 40%. Doppler parameters are consistent with abnormal left ventricular relaxation (grade 1 diastolic dysfunction). Doppler parameters are consistent with elevated mean left atrial filling pressure. - Regional wall motion abnormality: Severe hypokinesis of the basal inferolateral myocardium; moderate hypokinesis of the  basal-mid inferior and mid inferolateral myocardium; mild hypokinesis of the basal-mid inferoseptal and apical septal myocardium. - Left atrium: The atrium was mildly dilated. Impressions:  - Occaisional ectopy was noted on this study, making this study technically difficult for the assessment of cardiac function. Consistent with moderate coronary artery disease, probably due to atherosclerosis, predominantly involving the RCA. 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 The dysfunction is primarily systolic with some diastolic component.  CV procedure:  LM: 405 smooth distal stenosis  LAD: occluded at origen  INT: patent  LCX: 90% OM 2 at origen  RCA: diffusely irregular with 80 - 90% proximal stenosis, 60% mid, 40% before crux supplies PDA, the occluded continuation branch  , LIMA: patent to LAD which collateralizes diagonal vessel  SVG- OM: Occluded  SVG- DX: occluded  Culprit occlusion of SVG to distal RCA with no flow proximal due to anastomis occlusion with thrombus.  Very difficult PCI With PTCA/ thrombectomy, no flow phenomenon requiring IC/IV NTG/IC verapamil/ angiomax, brilinta 180 mg, integrelin, 2.0 x 12 Sprinter, 2.5 x 15 Emerge, Priority One AC Thrombectomy, 2.25 x 18 Xience Xpedition stent to distal anastomosis, 2.5 Annetta Quantum. Restoration of Timi 2 - 3 flow with 0 residual narrrowing at anastomosis in small continuation branch which was dilated proximally.  LV: EF 35% with severe hypo - AK on inferior wall.     Medications: I have reviewed the patient's current medications. Marland Kitchen aspirin  324 mg Oral Once  . aspirin EC  81 mg Oral Daily  .  atorvastatin  80 mg Oral q1800  . famotidine  20 mg Oral Daily  . fluticasone  2 spray Each Nare Daily  . furosemide  20 mg Oral Daily  . metoprolol tartrate  12.5 mg Oral BID  . Ticagrelor  90 mg Oral BID   Assessment/Plan: Principal  Problem:   STEMI (ST elevation myocardial infarction), of inf. 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 Erich Montane note keep until tomorrow.  Component of CHF secondary to MI pt is on Lasix, tolerating low dose BB.  Not sure BP will allow ACE yet.  Check labs in am.   Poss transfer to tele.   Mild SOB with ambulation yesterday.   Brilinta will be $320.00/month for the pt.   LOS: 3 days   Time spent with pt. :20 minutes. [facility] R  Nurse Practitioner Certified Pager [phone] 12/21/2012, 8:06 AM

## 2012-12-21 NOTE — Progress Notes (Signed)
Transferred to Unit 2000  From 2900 without any issues.  Vital signs stable.  Placed on the monitor.  Governor Specking, RN

## 2012-12-21 NOTE — Progress Notes (Signed)
Pt. Seen and examined. Agree with the NP/PA-C note as written.  Ambulating well without difficulty. No dyspnea. Diuresing well, total out -5L. Net even yesterday. Ok to transfer to telemetry today and likely d/c home tomorrow.  I agree with pursuing patient assistance for medication costs - Brillinta would be the antiplatelet of choice in my opinion, but if cost prohibits, may need to switch to plavix.  Chrystie Nose, MD, [facility] Attending Cardiologist [facility]

## 2012-12-21 NOTE — Progress Notes (Signed)
CARDIAC REHAB PHASE I   PRE:  Rate/Rhythm: 90 SR    BP: sitting 120/65    SaO2: 94 RA  MODE:  Ambulation: 350 ft   POST:  Rate/Rhythm: 110 ST    BP: sitting 152/69     SaO2: 94 RA  Denies CP/SOB today. Feels better, slept better. BP and HR higher, just received meds before walk. No questions with ed, just concerned re Brilinta. Continued to encourage to apply for financial assistance for Brilinta to see how he can be assisted. Highly encouraged CRPII in [employer]. [phone]   Elissa Lovett [place] CES, ACSM 12/21/2012 10:00 AM

## 2012-12-22 ENCOUNTER — Other Ambulatory Visit (HOSPITAL_COMMUNITY): Payer: Self-pay | Admitting: Internal Medicine

## 2012-12-22 DIAGNOSIS — M1990 Unspecified osteoarthritis, unspecified site: Secondary | ICD-10-CM | POA: Diagnosis present

## 2012-12-22 DIAGNOSIS — N289 Disorder of kidney and ureter, unspecified: Secondary | ICD-10-CM | POA: Diagnosis not present

## 2012-12-22 DIAGNOSIS — M436 Torticollis: Secondary | ICD-10-CM

## 2012-12-22 LAB — COMPREHENSIVE METABOLIC PANEL
ALT: 27 U/L (ref 0–53)
Alkaline Phosphatase: 59 U/L (ref 39–117)
BUN: 27 mg/dL — ABNORMAL HIGH (ref 6–23)
CO2: 25 mEq/L (ref 19–32)
Chloride: 98 mEq/L (ref 96–112)
GFR calc Af Amer: 58 mL/min — ABNORMAL LOW (ref >90)
GFR calc non Af Amer: 50 mL/min — ABNORMAL LOW (ref >90)
Glucose, Bld: 137 mg/dL — ABNORMAL HIGH (ref 70–99)
Potassium: 3.8 mEq/L (ref 3.5–5.1)
Sodium: 134 mEq/L — ABNORMAL LOW (ref 135–145)
Total Bilirubin: 1.1 mg/dL (ref 0.3–1.2)
Total Protein: 6.5 g/dL (ref 6.0–8.3)

## 2012-12-22 LAB — CK TOTAL AND CKMB (NOT AT ARMC): CK, MB: 2.1 ng/mL (ref 0.3–4.0)

## 2012-12-22 LAB — MAGNESIUM: Magnesium: 2.2 mg/dL (ref 1.5–2.5)

## 2012-12-22 MED ORDER — ISOSORBIDE MONONITRATE ER 30 MG PO TB24
30.0000 mg | ORAL_TABLET | Freq: Every day | ORAL | Status: DC
  Administered 2012-12-22: 30 mg via ORAL
  Filled 2012-12-22 (×2): qty 1

## 2012-12-22 MED ORDER — ISOSORBIDE MONONITRATE ER 30 MG PO TB24: 30.0000 mg | ORAL_TABLET | Freq: Every day | ORAL | Status: DC

## 2012-12-22 MED ORDER — TICAGRELOR 90 MG PO TABS: 90.0000 mg | ORAL_TABLET | Freq: Two times a day (BID) | ORAL | Status: DC

## 2012-12-22 MED ORDER — HEPARIN (PORCINE) IN NACL 100-0.45 UNIT/ML-% IJ SOLN
1650.0000 [IU]/h | INTRAMUSCULAR | Status: DC
  Filled 2012-12-22: qty 250

## 2012-12-22 MED ORDER — ASPIRIN 81 MG PO TBEC: 81.0000 mg | DELAYED_RELEASE_TABLET | Freq: Every day | ORAL | Status: AC

## 2012-12-22 MED ORDER — HEPARIN BOLUS VIA INFUSION
4000.0000 [IU] | Freq: Once | INTRAVENOUS | Status: DC
  Filled 2012-12-22: qty 4000

## 2012-12-22 MED ORDER — ACETAMINOPHEN 325 MG PO TABS: 650.0000 mg | ORAL_TABLET | ORAL | Status: DC | PRN

## 2012-12-22 MED ORDER — NAPROXEN 250 MG PO TABS: 250.0000 mg | ORAL_TABLET | Freq: Two times a day (BID) | ORAL | Status: DC

## 2012-12-22 MED ORDER — CYCLOBENZAPRINE HCL 10 MG PO TABS: 10.0000 mg | ORAL_TABLET | Freq: Three times a day (TID) | ORAL | Status: DC

## 2012-12-22 MED ORDER — NITROGLYCERIN IN D5W 200-5 MCG/ML-% IV SOLN
2.0000 ug/min | INTRAVENOUS | Status: DC
  Administered 2012-12-22: 5 ug/min via INTRAVENOUS

## 2012-12-22 MED ORDER — FUROSEMIDE 20 MG PO TABS: 20.0000 mg | ORAL_TABLET | Freq: Every day | ORAL | Status: DC

## 2012-12-22 MED ORDER — NAPROXEN 250 MG PO TABS
250.0000 mg | ORAL_TABLET | Freq: Two times a day (BID) | ORAL | Status: DC
  Administered 2012-12-22: 250 mg via ORAL
  Filled 2012-12-22 (×3): qty 1

## 2012-12-22 MED ORDER — NITROGLYCERIN 0.4 MG SL SUBL: 0.4000 mg | SUBLINGUAL_TABLET | SUBLINGUAL | Status: AC | PRN

## 2012-12-22 MED ORDER — METAXALONE 400 MG HALF TABLET
400.0000 mg | ORAL_TABLET | Freq: Three times a day (TID) | ORAL | Status: DC
  Administered 2012-12-22: 400 mg via ORAL
  Filled 2012-12-22 (×5): qty 1

## 2012-12-22 MED ORDER — MORPHINE SULFATE 2 MG/ML IJ SOLN
2.0000 mg | Freq: Once | INTRAMUSCULAR | Status: AC
  Administered 2012-12-22: 4 mg via INTRAVENOUS
  Filled 2012-12-22: qty 2

## 2012-12-22 MED ORDER — ATORVASTATIN CALCIUM 80 MG PO TABS: 80.0000 mg | ORAL_TABLET | Freq: Every day | ORAL | Status: DC

## 2012-12-22 MED ORDER — ASPIRIN 81 MG PO CHEW
CHEWABLE_TABLET | ORAL | Status: AC
  Administered 2012-12-22: 81 mg via ORAL
  Filled 2012-12-22: qty 1

## 2012-12-22 NOTE — Progress Notes (Addendum)
ANTICOAGULATION CONSULT NOTE - Initial Consult  Pharmacy Consult for heparin Indication: chest pain/ACS  Allergies  Allergen Reactions  . Aspirin Nausea And Vomiting    Can't take full strength uncoated aspirin  . Other Other (See Comments)    Pain medication that he was given after open heart surgery-sweating and hallucinations     Patient Measurements: Height: 6\' 2"  (188 cm) Weight: 262 lb 9.1 oz (119.1 kg) IBW/kg (Calculated) : 82.2 Heparin Dosing Weight:   Vital Signs: Temp: 98.8 F (37.1 C) (07/30 0600) Temp src: Oral (07/30 0600) BP: 125/77 mmHg (07/30 0600) Pulse Rate: 66 (07/30 0552)  Labs:  Recent Labs  12/19/12 0808 12/19/12 0809 12/20/12 0405 12/20/12 0949 12/22/12 0504  HGB 14.1  --   --  14.0  --   HCT 40.3  --   --  40.6  --   PLT 211  --   --  190  --   CREATININE 1.16  --  1.37*  --  1.37*  CKTOTAL  --   --   --   --  91  CKMB  --   --   --   --  2.1  TROPONINI  --  >20.00* 12.96*  --  6.09*    Estimated Creatinine Clearance: 67.9 ml/min (by C-G formula based on Cr of 1.37).   Medical History: Past Medical History  Diagnosis Date  . Coronary artery disease   . Ruptured lumbar disc   . Arthritis   . High triglycerides   . Cardiomyopathy, ischemic, EF by cath 35% and Echo 35-40% with STEMI and PCI 12/21/2012    Medications:  Prescriptions prior to admission  Medication Sig Dispense Refill  . aspirin 325 MG EC tablet Take 325 mg by mouth daily.      Marland Kitchen gemfibrozil (LOPID) 600 MG tablet Take 600 mg by mouth 2 (two) times daily before a meal.      . lisinopril (PRINIVIL,ZESTRIL) 5 MG tablet Take 5 mg by mouth at bedtime.      . metoprolol tartrate (LOPRESSOR) 25 MG tablet Take 12.5 mg by mouth 2 (two) times daily.      . ranitidine (ZANTAC) 150 MG tablet Take 150 mg by mouth 2 (two) times daily.      Marland Kitchen triamcinolone (NASACORT) 55 MCG/ACT nasal inhaler Place 2 sprays into the nose daily.        Assessment: stemi admission now with recurrent  cp and ekg changes. Heparin to begin per acs protocol.  Goal of Therapy:  Heparin level 0.3-0.7 units/ml Monitor platelets by anticoagulation protocol: Yes   Plan:  Heparin 4000 units x1 then 1650 units/hr.  8 hour HL  Daily HL and cbc starting 7/31   Janice Coffin 12/22/2012,7:01 AM

## 2012-12-22 NOTE — Progress Notes (Signed)
AVS reviewed with patient and his daughter, Marcelino Duster using teach back method. Pt denies any further questions at this time. Clothing, dentures, eyeglasses, Brillinta co pay card, 4 prescriptions, and shoes sent home with patient.   Dawson Bills, RN

## 2012-12-22 NOTE — Discharge Summary (Signed)
Patient ID: Ryan Wise,  MRN: [id number], DOB/AGE: July 06, 1940 72 y.o.  Admit date: 12/18/2012 Discharge date: 12/22/2012  Primary Care Provider:  Primary Cardiologist: Dr Rennis Golden  Discharge Diagnoses Principal Problem:   STEMI of inf. 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  He presented to [facility] 12/18/12 with a STEMI and was taken to the cath lab by Dr Tresa Endo. This revealed the LIMA to the LAD to be patent with collaterals to the Dx. The  SVG- OM: was occluded and the SVG- DX: was occluded  The culprit appeared to be occlusion of SVG to distal RCA with no flow proximal due to anastomis occlusion with thrombus. He underwent a very difficult PCI with PTCA/ thrombectomy. He had no flow phenomenon requiring IC/IV NTG/IC verapamil/ angiomax, brilinta 180 mg, integrelin. He ultimately received a  Xience Xpedition stent to distal anastomosis. He did have acute, transient CHF but stabilized with diuresis. He did well and was transferred to telemetry on 12/21/12.. Troponin was greater than 20. EF at cath was 35%. EF by echo was 35=40% with severe hypokinesis of the basal inferolateral myocardium; moderate hypokinesis of the basal-mid inferior and mid inferolateral myocardium; and mild hypokinesis of the basal-mid inferoseptal and apical septal myocardium. After he was transferred to telemetry he awaked early in the morning of the 30th with neck pain. He was transferred back to the ICU by the cardiology fellow on call. The pt was seen and examined by Dr  Rennis Golden the morning of the 29th. Dr Rennis Golden did not feel his symptoms were cardiac. We added a NSAID and Skelaxin. He will need a follow up BMP at his office visit. We feel he can be discharged 12/22/12. We did stop his Lopid at discharge as Lipitor 80mg  was started this admission.     Discharge Vitals:  Blood pressure 125/77, pulse 66, temperature 98.8 F (37.1 C), temperature source Oral, resp. rate 18, height 6\' 2"  (1.88 m), weight 119.1 kg (262 lb 9.1 oz), SpO2 98.00%.    Labs: Results for orders placed during the hospital encounter of 12/18/12 (from the past 48 hour(s))  COMPREHENSIVE METABOLIC PANEL     Status: Abnormal   Collection Time    12/22/12  5:04 AM      Result Value Range   Sodium 134 (*) 135 - 145 mEq/L   Potassium 3.8  3.5 - 5.1 mEq/L   Chloride 98  96 - 112 mEq/L   CO2 25  19 - 32 mEq/L   Glucose, Bld 137 (*) 70 - 99 mg/dL   BUN 27 (*) 6 - 23 mg/dL   Creatinine, Ser 4.09 (*) 0.50 - 1.35 mg/dL   Calcium 9.0  8.4 - 81.1 mg/dL   Total Protein 6.5  6.0 - 8.3 g/dL   Albumin 3.2 (*) 3.5 - 5.2 g/dL   AST 29  0 - 37 U/L   ALT 27  0 - 53 U/L   Alkaline Phosphatase 59  39 - 117  U/L   Total Bilirubin 1.1  0.3 - 1.2 mg/dL   GFR calc non Af Amer 50 (*) >90 mL/min   GFR calc Af Amer 58 (*) >90 mL/min   Comment:            The eGFR has been calculated     using the CKD EPI equation.     This calculation has not been     validated in all clinical     situations.     eGFR's persistently     <90 mL/min signify     possible Chronic Kidney Disease.  MAGNESIUM     Status: None   Collection Time    12/22/12  5:04 AM      Result Value Range   Magnesium 2.2  1.5 - 2.5 mg/dL  PRO B NATRIURETIC PEPTIDE     Status: Abnormal   Collection Time    12/22/12  5:04 AM      Result Value Range   Pro B Natriuretic peptide (BNP) 688.7 (*) 0 - 125 pg/mL  TROPONIN I     Status: Abnormal   Collection Time    12/22/12  5:04 AM      Result Value Range   Troponin I 6.09 (*) <0.30 ng/mL    Comment:            Due to the release kinetics of cTnI,     a negative result within the first hours     of the onset of symptoms does not rule out     myocardial infarction with certainty.     If myocardial infarction is still suspected,     repeat the test at appropriate intervals.     CRITICAL RESULT CALLED TO, READ BACK BY AND VERIFIED WITH:     [place] BRODY RN [id number] [phone] GREEN R  CK TOTAL AND CKMB     Status: None   Collection Time    12/22/12  5:04 AM      Result Value Range   Total CK 91  7 - 232 U/L   CK, MB 2.1  0.3 - 4.0 ng/mL   Relative Index RELATIVE INDEX IS INVALID  0.0 - 2.5   Comment: WHEN CK < 100 U/L               Disposition:      Follow-up Information   Follow up with Chrystie Nose, MD. (office will call you)    Contact information:   [address] [place] [address] [phone]       Discharge Medications:    Medication List    STOP taking these medications       gemfibrozil 600 MG tablet  Commonly known as:  LOPID      TAKE these medications       acetaminophen 325 MG tablet  Commonly known as:  TYLENOL  Take 2 tablets (650 mg total) by mouth every 4 (four) hours as needed.     aspirin 81 MG EC tablet  Take 1 tablet (81 mg total) by mouth daily.     atorvastatin 80 MG tablet  Commonly known as:  LIPITOR  Take 1 tablet (80 mg total) by mouth daily at 6 PM.     cyclobenzaprine 10 MG tablet  Commonly known as:  FLEXERIL  Take 1 tablet (10 mg total) by mouth 3 (three) times daily.     furosemide 20 MG tablet  Commonly known as:  LASIX  Take 1 tablet (20 mg total) by mouth daily.     isosorbide mononitrate 30 MG 24 hr tablet  Commonly known as:  IMDUR  Take 1 tablet (30 mg total) by mouth daily.     lisinopril 5 MG tablet  Commonly known as:  PRINIVIL,ZESTRIL  Take 5 mg by mouth at bedtime.     metoprolol tartrate 25 MG tablet  Commonly known as:  LOPRESSOR  Take 12.5 mg by mouth 2 (two) times daily.      naproxen 250 MG tablet  Commonly known as:  NAPROSYN  Take 1 tablet (250 mg total) by mouth 2 (two) times daily with a meal.     nitroGLYCERIN 0.4 MG SL tablet  Commonly known as:  NITROSTAT  Place 1 tablet (0.4 mg total) under the tongue every 5 (five) minutes as needed for chest pain.     ranitidine 150 MG tablet  Commonly known as:  ZANTAC  Take 150 mg by mouth 2 (two) times daily.     Ticagrelor 90 MG Tabs tablet  Commonly known as:  BRILINTA  Take 1 tablet (90 mg total) by mouth 2 (two) times daily.     triamcinolone 55 MCG/ACT nasal inhaler  Commonly known as:  NASACORT  Place 2 sprays into the nose daily.         Duration of Discharge Encounter: Greater than 30 minutes including physician time.  Jolene Provost PA-C 12/22/2012 10:05 AM

## 2012-12-22 NOTE — Progress Notes (Signed)
At 03:55am patient called nurse to room stated that he had the worst pain ever on the left side of his neck and Left shoulder.  Stated it was worst than a cramp in his neck.  B/p 139/84 Hr 75   Oxygen started at 2 l/m   O2 sat 96%  04:00am   NTG   0.4 sl           B/P  115/75   04:05am   NTG  0.4 sl            B/P   99/62  Cardiology was paged ( Dr. Leeann Must )   Governor Specking, RN

## 2012-12-22 NOTE — Telephone Encounter (Signed)
Need the quanity for his Naprosyn 250mg  prescription-Please call asap.

## 2012-12-22 NOTE — Progress Notes (Signed)
Subjective:  Transferred from telemetry after he woke up with severe M/S neck pain. No anginal symptoms. Better this am but still limited ROM of his neck.  Objective:  Vital Signs in the last 24 hours: Temp:  [97.8 F (36.6 C)-99.4 F (37.4 C)] 98.8 F (37.1 C) (07/30 0600) Pulse Rate:  [66-80] 66 (07/30 0552) Resp:  [18-20] 18 (07/30 0546) BP: (99-147)/(58-84) 125/77 mmHg (07/30 0600) SpO2:  [95 %-98 %] 98 % (07/30 0600) Weight:  [119.1 kg (262 lb 9.1 oz)] 119.1 kg (262 lb 9.1 oz) (07/30 0600)  Intake/Output from previous day:  Intake/Output Summary (Last 24 hours) at 12/22/12 0802 Last data filed at 12/22/12 0600  Gross per 24 hour  Intake  971.5 ml  Output    200 ml  Net  771.5 ml    Physical Exam: General appearance: alert, cooperative, no distress and moderately obese Lungs: clear to auscultation bilaterally Heart: regular rate and rhythm Rt groin without hematoma   Rate: 80  Rhythm: normal sinus rhythm and occasional PVC  Lab Results:  Recent Labs  12/19/12 0808 12/20/12 0949  WBC 12.3* 9.9  HGB 14.1 14.0  PLT 211 190    Recent Labs  12/20/12 0405 12/22/12 0504  NA 137 134*  K 3.3* 3.8  CL 99 98  CO2 29 25  GLUCOSE 103* 137*  BUN 22 27*  CREATININE 1.37* 1.37*    Recent Labs  12/20/12 0405 12/22/12 0504  TROPONINI 12.96* 6.09*   Hepatic Function Panel  Recent Labs  12/22/12 0504  PROT 6.5  ALBUMIN 3.2*  AST 29  ALT 27  ALKPHOS 59  BILITOT 1.1   No results found for this basename: CHOL,  in the last 72 hours No results found for this basename: INR,  in the last 72 hours  Imaging: Imaging results have been reviewed  Cardiac Studies:  Assessment/Plan:   Principal Problem:   STEMI, of inf. wall with PCI with Xpedition stent to VG to distal RCA; 12/18/12  Active Problems:   CAD, CABG '99-     Acute combined systolic and diastolic heart failure - due to STEMI   ICM, EF by cath 35% and Echo 35-40% with STEMI and PCI   Acute  renal insufficiency- SCr 1.37   Dyslipidemia - high TG, on fibrate   DJD (degenerative joint disease)- 5 back surgeries   Obesity, unspecified    PLAN: NSAID, Flexeril.Stop Heparin and NTG.  Corine Shelter PA-C Beeper [phone] 12/22/2012, 8:02 AM

## 2012-12-22 NOTE — Progress Notes (Addendum)
X-cover note  MD Tresa Endo called for left neck/shoulder pain - worst pain of his life. I immediately saw patient and reviewed ECG at approximately 04:15. ON exam; vitals stable BP 124/81  Pulse 79  Temp(Src) 98.2 F (36.8 C) (Oral)  Resp 20  Ht 6' (1.829 m)  Wt 124.6 kg (274 lb 11.1 oz)  BMI 37.25 kg/m2  SpO2 96%, Mr. Kowalewski lying in bed on pillow, 60 degrees, turned left - doesn't want to turn his neck, neck mildly stiff. Heart regular with occasional PVCs, no m/r/g, lungs fairly clear, abd soft, ND/NT, ext warm, perfused. Labs reviewed; recent ECGs reviewed; 04:02 ECG with stable mild ST depression in V2-V6, inferior qs/infarct. Admission STEMI ECG with mild ST elevation inferiorly. He says current pain is neck/shoulder 8/10, no pain relief with SL NTG. Admission pain was burning substernal pain - very unlike now. I favor diagnosis of musculoskeletal based on the way patient is positioned, lack of response to NTG and difficult (edit - different) character of pain - no significant rise in HR either. Will trial IV morphine, muscle relaxant. Repeat ECG now. Follow closely. Repeat enzymes.   Leeann Must, MD  I stayed at patient's bedside from 04:15 until 05:05 ~ 50 minutes of critical care time while we repeated ECG x2, gave more morphine IV and started IV NTG gtt. Mr. Lama finally felt improved with CP down to 4/10 after second round of morphine. NTG gtt arrived. His ECGs were similar to presentation ECG with V2-V6 St depression and inferiorly q-waves with hint of ST elevation but < 1 mm (but called STEMI on ECG). Given challenge to finally improve pain, I called Dr. Tresa Endo, interventionalist on call, who actually performed PCI on Mr. Normoyle to discuss the case. Given challenging nature and location (distal SVG with atherectomy and significant clot burden) with stable hemodynamics and improving CP but persistent we will attempt to manage medically in the short-term and readdress early this morning or if  symptoms change. Transfer orders reviewed and order to transfer to CCU placed. Will start heparin gtt, continue nitroglycerin gtt, IV morphine as needed, repeat aspirin given.   Leeann Must, MD

## 2012-12-22 NOTE — Telephone Encounter (Signed)
Rx was sent to pharmacy electronically. 

## 2012-12-22 NOTE — Progress Notes (Signed)
Pt. Seen and examined. Agree with the NP/PA-C note as written.  Complained of intense neck pain and stiffness this morning, unable to move neck. I thoroughly examined him this morning and feel this is definitely musculoskeletal pain over the left trapezius. This is reproducible with palpation. He has difficulty turning his head more than about 20 degrees to the left before he has pain. I can passively move it about 45 degrees, but note some muscle stiffness. We will Rx for muscle relaxants and NSAIDs. Troponin is coming down. EKG is without new changes. I feel he can be discharged home today. Follow-up early next week.   Chrystie Nose, MD, [facility] Attending Cardiologist [facility]

## 2013-01-07 ENCOUNTER — Ambulatory Visit (INDEPENDENT_AMBULATORY_CARE_PROVIDER_SITE_OTHER): Payer: Medicare Other | Admitting: Internal Medicine

## 2013-01-07 ENCOUNTER — Encounter: Payer: Self-pay | Admitting: Internal Medicine

## 2013-01-07 ENCOUNTER — Telehealth: Payer: Self-pay | Admitting: Internal Medicine

## 2013-01-07 ENCOUNTER — Other Ambulatory Visit: Payer: Self-pay | Admitting: *Deleted

## 2013-01-07 VITALS — BP 118/78 | HR 74 | Ht 74.0 in | Wt 275.5 lb

## 2013-01-07 DIAGNOSIS — I219 Acute myocardial infarction, unspecified: Secondary | ICD-10-CM

## 2013-01-07 DIAGNOSIS — R0989 Other specified symptoms and signs involving the circulatory and respiratory systems: Secondary | ICD-10-CM | POA: Diagnosis not present

## 2013-01-07 DIAGNOSIS — Z79899 Other long term (current) drug therapy: Secondary | ICD-10-CM | POA: Diagnosis not present

## 2013-01-07 DIAGNOSIS — I25810 Atherosclerosis of coronary artery bypass graft(s) without angina pectoris: Secondary | ICD-10-CM | POA: Diagnosis not present

## 2013-01-07 DIAGNOSIS — I5041 Acute combined systolic (congestive) and diastolic (congestive) heart failure: Secondary | ICD-10-CM | POA: Diagnosis not present

## 2013-01-07 DIAGNOSIS — I2510 Atherosclerotic heart disease of native coronary artery without angina pectoris: Secondary | ICD-10-CM

## 2013-01-07 DIAGNOSIS — R0600 Dyspnea, unspecified: Secondary | ICD-10-CM

## 2013-01-07 DIAGNOSIS — R0609 Other forms of dyspnea: Secondary | ICD-10-CM

## 2013-01-07 DIAGNOSIS — I213 ST elevation (STEMI) myocardial infarction of unspecified site: Secondary | ICD-10-CM

## 2013-01-07 MED ORDER — LORAZEPAM 0.5 MG PO TABS: 0.5000 mg | ORAL_TABLET | Freq: Two times a day (BID) | ORAL | Status: DC | PRN

## 2013-01-07 NOTE — Patient Instructions (Addendum)
Your physician recommends that you return for lab work. BMP BNP  Your physician wants you to follow-up in: 6 months.  You will receive a reminder letter in the mail two months in advance. If you don't receive a letter, please call our office to schedule the follow-up appointment.  Dr. Rennis Golden has referred you to [facility] for Phase 2 Cardiac Rehab

## 2013-01-07 NOTE — Telephone Encounter (Signed)
Medication prescription faxed to pharmacy 01/07/13

## 2013-01-07 NOTE — Telephone Encounter (Signed)
Per Eileen Stanford, RN, Rx was faxed.  Returned call to Rowena, pt's daughter.  Informed Rx was faxed.  Verbalized understanding.

## 2013-01-07 NOTE — Telephone Encounter (Signed)
Patient was seen today and was told he was going to be placed on a new medication---did we call the pharmacy?

## 2013-01-08 ENCOUNTER — Encounter: Payer: Self-pay | Admitting: Internal Medicine

## 2013-01-08 DIAGNOSIS — R0609 Other forms of dyspnea: Secondary | ICD-10-CM | POA: Insufficient documentation

## 2013-01-08 LAB — BASIC METABOLIC PANEL
BUN: 19 mg/dL (ref 6–23)
Chloride: 103 mEq/L (ref 96–112)
Creat: 1.24 mg/dL (ref 0.50–1.35)

## 2013-01-08 NOTE — Progress Notes (Signed)
OFFICE NOTE  Chief Complaint:  Hospital follow-up  Primary Care Physician: Thayer Headings, MD  HPI:  Ryan Wise is 72 y/o with a history of CAD, s/p CABG X 4 in 1999 with an LIMA-LAD, SVG-OM, SVG-Dx, SVG-RCA. He presented to [facility] 12/18/12 with a STEMI and was taken to the cath lab by Dr Tresa Endo. This revealed the LIMA to the LAD to be patent with collaterals to the Dx. The SVG- OM: was occluded and the SVG- DX: was occluded The culprit appeared to be occlusion of SVG to distal RCA with no flow proximal due to anastomis occlusion with thrombus. He underwent a very difficult PCI with PTCA/ thrombectomy. He had no flow phenomenon requiring IC/IV NTG/IC verapamil/ angiomax, brilinta 180 mg, integrelin. He ultimately received a Xience Xpedition stent to distal anastomosis. He did have acute, transient CHF but stabilized with diuresis. He did well and was transferred to telemetry on 12/21/12.. Troponin was greater than 20. EF at cath was 35%. EF by echo was 35=40% with severe hypokinesis of the basal inferolateral myocardium; moderate hypokinesis of the basal-mid inferior and mid inferolateral myocardium; and mild hypokinesis of the basal-mid inferoseptal and apical septal myocardium. After he was transferred to telemetry he awaked early in the morning of the 30th with neck pain. He was transferred back to the ICU by the cardiology fellow on call. The pt was seen and examined by Dr Rennis Golden the morning of the 29th. Dr Rennis Golden did not feel his symptoms were cardiac. We added a NSAID and Skelaxin. He took this for a day or 2 he reported his neck pain improved significantly. Today is here for followup and appears to be doing fairly well. He does report some shortness of breath doing moderate to more intense activities which he is trying to avoid. He did some lawn mowing which made her short of breath after a while. 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   PMHx:  Past Medical History  Diagnosis Date    . Ruptured lumbar disc   . Arthritis   . High triglycerides   . Cardiomyopathy, ischemic, EF by cath 35% and Echo 35-40% with STEMI and PCI 12/21/2012  . CHF (congestive heart failure)     Acute combined systolic and diastolic heart failure  . ST elevation myocardial infarction (STEMI) of inferior wall 12/18/12    STEMI of inf. wall w/PCI with Xperdition stent to VG to distal RCA  . Abnormal echocardiogram 05/31/12    EF = 45-50% with more global hypokinesis. There was eccentric hypertrophy of the ventricle. There was a trace AI and mild MR.  . Obesity     unspecified  . Acute renal insufficiency   . Coronary artery disease     CABG in 1999 which was a 5-vessel bypass and had a questionable history of A fib. He underwent monitoring whick showed sinus bradycardia and PACs but no evidence of A fib.  Marland Kitchen Hypertension   . GERD (gastroesophageal reflux disease)     Past Surgical History  Procedure Laterality Date  . Lumbar disc surgery    . Tonsillectomy    . Back surgery      (5) back surgeries  . Coronary artery bypass graft  1999    (CAD) CABG was a 5-vessel bypass and had a questionable history of A fib. He underwent monitoring whick showed sinus bradycardia and PACs but no evidence of A fib.  Marland Kitchen Open heart surgery  03/1998    FAMHx:  Family  History  Problem Relation Age of Onset  . CAD Mother   . Heart disease Mother   . CAD Brother   . Heart attack Brother   . Hyperlipidemia Sister   . Leukemia Father   . CAD Maternal Grandmother   . Brain cancer Brother   . CAD Sister     SOCHx:   reports that he quit smoking about 25 years ago. He has quit using smokeless tobacco. He reports that he does not drink alcohol or use illicit drugs.  ALLERGIES:  Allergies  Allergen Reactions  . Aspirin Nausea And Vomiting    Can't take full strength uncoated aspirin  . Other Other (See Comments)    Pain medication that he was given after open heart surgery-sweating and hallucinations      ROS: A comprehensive review of systems was negative except for: Constitutional: positive for fatigue Respiratory: positive for dyspnea on exertion  HOME MEDS: Current Outpatient Prescriptions  Medication Sig Dispense Refill  . acetaminophen (TYLENOL) 325 MG tablet Take 2 tablets (650 mg total) by mouth every 4 (four) hours as needed.      Marland Kitchen aspirin EC 81 MG EC tablet Take 1 tablet (81 mg total) by mouth daily.      Marland Kitchen atorvastatin (LIPITOR) 80 MG tablet Take 1 tablet (80 mg total) by mouth daily at 6 PM.  30 tablet  11  . furosemide (LASIX) 20 MG tablet Take 1 tablet (20 mg total) by mouth daily.  30 tablet  11  . isosorbide mononitrate (IMDUR) 30 MG 24 hr tablet Take 1 tablet (30 mg total) by mouth daily.  30 tablet  11  . lisinopril (PRINIVIL,ZESTRIL) 5 MG tablet TAKE 1 TABLET DAILY ORALLY  30 tablet  7  . LORazepam (ATIVAN) 0.5 MG tablet Take 1 tablet (0.5 mg total) by mouth 2 (two) times daily as needed for anxiety.  60 tablet  3  . metoprolol tartrate (LOPRESSOR) 25 MG tablet Take 12.5 mg by mouth 2 (two) times daily.      . naproxen (NAPROSYN) 250 MG tablet Take 1 tablet (250 mg total) by mouth 2 (two) times daily with a meal.  60 tablet  7  . nitroGLYCERIN (NITROSTAT) 0.4 MG SL tablet Place 1 tablet (0.4 mg total) under the tongue every 5 (five) minutes as needed for chest pain.  25 tablet  3  . ranitidine (ZANTAC) 150 MG tablet Take 150 mg by mouth 2 (two) times daily.      . Ticagrelor (BRILINTA) 90 MG TABS tablet Take 1 tablet (90 mg total) by mouth 2 (two) times daily.  60 tablet  11   No current facility-administered medications for this visit.    LABS/IMAGING: No results found for this or any previous visit (from the past 48 hour(s)). No results found.  VITALS: BP 118/78  Pulse 74  Ht 6\' 2"  (1.88 m)  Wt 275 lb 8 oz (124.966 kg)  BMI 35.36 kg/m2  EXAM: General appearance: alert and no distress Neck: no adenopathy, no carotid bruit, no JVD, supple, symmetrical,  trachea midline and thyroid not enlarged, symmetric, no tenderness/mass/nodules Lungs: clear to auscultation bilaterally Heart: regular rate and rhythm, S1, S2 normal, no murmur, click, rub or gallop Abdomen: soft, non-tender; bowel sounds normal; no masses,  no organomegaly Extremities: extremities normal, atraumatic, no cyanosis or edema Pulses: 2+ and symmetric Skin: Skin color, texture, turgor normal. No rashes or lesions Neurologic: Grossly normal  EKG: Normal sinus rhythm at 74  ASSESSMENT: 1.  Coronary artery disease status post CABG with ST elevation MI and stent placement to the vein graft to PDA, with occluded grafts to the OM and diagonal vessels and a patent LIMA to LAD 2. 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 Hypertension 4. Dyslipidemia  PLAN: 1.   Mr. Neil Crouch is doing fairly well after his recent hospitalization with ST elevation MI secondary to vein graft occlusion. He underwent successful stenting with TIMI 3 flow has not had any further chest pain. Unfortunately his EF is now reduced to 35-40%. This may recover over time. We discussed his risk of sudden cardiac death with his not likely increased with his EF being slightly above 35%. I don't feel that a life vest or ICD is indicated at this time. He is currently on a good heart failure regimen and will be to continue his Lasix due to shortness of breath. I'll go ahead and check a BNP and BMP today to make sure the kidney function is stable and to see if his BNP has resolved. He may need additional diuresis if this is elevated. Also referred him to [facility] for a phase II cardiac rehabilitation.  Chrystie Nose, MD, [facility] Attending Cardiologist [facility]  Leean Amezcua C 01/08/2013, 12:09 PM

## 2013-01-11 ENCOUNTER — Telehealth: Payer: Self-pay | Admitting: Internal Medicine

## 2013-01-11 ENCOUNTER — Encounter: Payer: Self-pay | Admitting: *Deleted

## 2013-01-11 MED ORDER — TICAGRELOR 90 MG PO TABS: 90.0000 mg | ORAL_TABLET | Freq: Two times a day (BID) | ORAL | Status: DC

## 2013-01-11 NOTE — Telephone Encounter (Signed)
Pt's daughter was calling saying that her dad doesn't have a prescription drug plan and the Brilinta would cost over 300.00. They are working with the manufacturer to see if they can help with the cost. She wanted to know if we had samples.

## 2013-01-11 NOTE — Telephone Encounter (Signed)
Samples left at front desk for pt pick up.  Lot: [id number] Exp: 09/2014.  Returned call and daughter Marcelino Duster) informed samples left at front desk.  Verbalized understanding and agreed w/ plan.  Stated she is trying to get pt help through [employer] b/c pt does not have a supplemental Rx plan.

## 2013-01-27 ENCOUNTER — Telehealth: Payer: Self-pay | Admitting: Internal Medicine

## 2013-01-27 NOTE — Telephone Encounter (Signed)
Message forwarded to Dr. Hilty/Jenna, RN. 

## 2013-01-27 NOTE — Telephone Encounter (Signed)
Need to have his Lorazepam 5mg  refilled .Marland Kitchen Supposed to take them twice a day but has been taken them every 8 hrs because he get so nervous. He is now out of the medication and needs some more . The pharmacy will not refill them because it is to early  To refill.. Please Call    Thanks

## 2013-01-28 NOTE — Telephone Encounter (Signed)
Pt is calling waiting to hear back from someone regarding his medication. Please call him back ASAP.

## 2013-01-28 NOTE — Telephone Encounter (Signed)
Called pharmacy with verbal order authorization from Dr. Rennis Golden for refill of Ativan. Contacted patient with this information and that he should defer future refills for this medication to his PCP. Patient verbalized understanding.

## 2013-01-28 NOTE — Telephone Encounter (Signed)
Called yesterday-still waiting to hear from somebody.

## 2013-01-28 NOTE — Telephone Encounter (Signed)
Message forwarded to J. Elkins, RN.  

## 2013-05-05 DIAGNOSIS — E1129 Type 2 diabetes mellitus with other diabetic kidney complication: Secondary | ICD-10-CM | POA: Diagnosis not present

## 2013-05-05 DIAGNOSIS — Z1331 Encounter for screening for depression: Secondary | ICD-10-CM | POA: Diagnosis not present

## 2013-05-05 DIAGNOSIS — Z23 Encounter for immunization: Secondary | ICD-10-CM | POA: Diagnosis not present

## 2013-05-05 DIAGNOSIS — I10 Essential (primary) hypertension: Secondary | ICD-10-CM | POA: Diagnosis not present

## 2013-05-05 DIAGNOSIS — Z125 Encounter for screening for malignant neoplasm of prostate: Secondary | ICD-10-CM | POA: Diagnosis not present

## 2013-05-05 DIAGNOSIS — Z0000 Encounter for general adult medical examination without abnormal findings: Secondary | ICD-10-CM | POA: Diagnosis not present

## 2013-05-12 DIAGNOSIS — E785 Hyperlipidemia, unspecified: Secondary | ICD-10-CM | POA: Diagnosis not present

## 2013-05-12 DIAGNOSIS — I10 Essential (primary) hypertension: Secondary | ICD-10-CM | POA: Diagnosis not present

## 2013-05-12 DIAGNOSIS — N182 Chronic kidney disease, stage 2 (mild): Secondary | ICD-10-CM | POA: Diagnosis not present

## 2013-05-12 DIAGNOSIS — E1129 Type 2 diabetes mellitus with other diabetic kidney complication: Secondary | ICD-10-CM | POA: Diagnosis not present

## 2013-05-15 ENCOUNTER — Encounter (HOSPITAL_COMMUNITY): Payer: Self-pay | Admitting: Emergency Medicine

## 2013-05-15 ENCOUNTER — Emergency Department (HOSPITAL_COMMUNITY): Payer: Medicare Other

## 2013-05-15 DIAGNOSIS — Z951 Presence of aortocoronary bypass graft: Secondary | ICD-10-CM | POA: Diagnosis not present

## 2013-05-15 DIAGNOSIS — M5126 Other intervertebral disc displacement, lumbar region: Secondary | ICD-10-CM | POA: Insufficient documentation

## 2013-05-15 DIAGNOSIS — I2589 Other forms of chronic ischemic heart disease: Secondary | ICD-10-CM | POA: Insufficient documentation

## 2013-05-15 DIAGNOSIS — J209 Acute bronchitis, unspecified: Secondary | ICD-10-CM | POA: Insufficient documentation

## 2013-05-15 DIAGNOSIS — R0602 Shortness of breath: Secondary | ICD-10-CM | POA: Insufficient documentation

## 2013-05-15 DIAGNOSIS — I129 Hypertensive chronic kidney disease with stage 1 through stage 4 chronic kidney disease, or unspecified chronic kidney disease: Secondary | ICD-10-CM | POA: Insufficient documentation

## 2013-05-15 DIAGNOSIS — N289 Disorder of kidney and ureter, unspecified: Secondary | ICD-10-CM | POA: Insufficient documentation

## 2013-05-15 DIAGNOSIS — J9801 Acute bronchospasm: Secondary | ICD-10-CM | POA: Insufficient documentation

## 2013-05-15 DIAGNOSIS — Z7982 Long term (current) use of aspirin: Secondary | ICD-10-CM | POA: Diagnosis not present

## 2013-05-15 DIAGNOSIS — Z888 Allergy status to other drugs, medicaments and biological substances status: Secondary | ICD-10-CM | POA: Insufficient documentation

## 2013-05-15 DIAGNOSIS — Z87891 Personal history of nicotine dependence: Secondary | ICD-10-CM | POA: Insufficient documentation

## 2013-05-15 DIAGNOSIS — M129 Arthropathy, unspecified: Secondary | ICD-10-CM | POA: Diagnosis not present

## 2013-05-15 DIAGNOSIS — Z79899 Other long term (current) drug therapy: Secondary | ICD-10-CM | POA: Diagnosis not present

## 2013-05-15 DIAGNOSIS — I2510 Atherosclerotic heart disease of native coronary artery without angina pectoris: Secondary | ICD-10-CM | POA: Diagnosis not present

## 2013-05-15 DIAGNOSIS — I509 Heart failure, unspecified: Secondary | ICD-10-CM | POA: Diagnosis not present

## 2013-05-15 DIAGNOSIS — E669 Obesity, unspecified: Secondary | ICD-10-CM | POA: Insufficient documentation

## 2013-05-15 DIAGNOSIS — J3489 Other specified disorders of nose and nasal sinuses: Secondary | ICD-10-CM | POA: Diagnosis not present

## 2013-05-15 DIAGNOSIS — R079 Chest pain, unspecified: Secondary | ICD-10-CM | POA: Diagnosis not present

## 2013-05-15 DIAGNOSIS — E781 Pure hyperglyceridemia: Secondary | ICD-10-CM | POA: Diagnosis not present

## 2013-05-15 DIAGNOSIS — R062 Wheezing: Secondary | ICD-10-CM | POA: Insufficient documentation

## 2013-05-15 DIAGNOSIS — K219 Gastro-esophageal reflux disease without esophagitis: Secondary | ICD-10-CM | POA: Insufficient documentation

## 2013-05-15 DIAGNOSIS — I252 Old myocardial infarction: Secondary | ICD-10-CM | POA: Insufficient documentation

## 2013-05-15 DIAGNOSIS — I10 Essential (primary) hypertension: Secondary | ICD-10-CM | POA: Diagnosis not present

## 2013-05-15 NOTE — ED Notes (Signed)
tthe pt is c/o coughing  With some difficulty breathing since yesterday and he has had a temp also.  Some wheezing

## 2013-05-16 ENCOUNTER — Emergency Department (HOSPITAL_COMMUNITY)
Admission: EM | Admit: 2013-05-16 | Discharge: 2013-05-16 | Disposition: A | Discharge status: Home / Self Care | Payer: Medicare Other | Attending: Emergency Medicine | Admitting: Emergency Medicine

## 2013-05-16 DIAGNOSIS — J9801 Acute bronchospasm: Secondary | ICD-10-CM

## 2013-05-16 DIAGNOSIS — J40 Bronchitis, not specified as acute or chronic: Secondary | ICD-10-CM

## 2013-05-16 LAB — POCT I-STAT, CHEM 8
BUN: 19 mg/dL (ref 6–23)
Calcium, Ion: 1.19 mmol/L (ref 1.13–1.30)
Chloride: 102 mEq/L (ref 96–112)
Glucose, Bld: 130 mg/dL — ABNORMAL HIGH (ref 70–99)
HCT: 41 % (ref 39.0–52.0)
Hemoglobin: 13.9 g/dL (ref 13.0–17.0)
Potassium: 3.5 mEq/L (ref 3.5–5.1)

## 2013-05-16 LAB — POCT I-STAT TROPONIN I: Troponin i, poc: 0 ng/mL (ref 0.00–0.08)

## 2013-05-16 MED ORDER — PREDNISONE 20 MG PO TABS: ORAL_TABLET | ORAL | Status: DC

## 2013-05-16 MED ORDER — ALBUTEROL SULFATE (5 MG/ML) 0.5% IN NEBU
5.0000 mg | INHALATION_SOLUTION | Freq: Once | RESPIRATORY_TRACT | Status: AC
  Administered 2013-05-16: 5 mg via RESPIRATORY_TRACT
  Filled 2013-05-16: qty 1

## 2013-05-16 NOTE — ED Provider Notes (Signed)
CSN: [id number]     Arrival date & time 05/15/13  2207 History   First MD Initiated Contact with Patient 05/16/13 0101     Chief Complaint  Patient presents with  . Cough   (Consider location/radiation/quality/duration/timing/severity/associated sxs/prior Treatment) HPI This patient is a 72 yo man with a history of CAD, ischemic cardiomyopathy EF 45-50% and multiple comorbidities. He presents with complaints of a productive cough x 2 days and wheezing which began today. He denies fever. He reports compliance with all medications. He was seen by his PCP for cough a couple of days ago and started on Zpack. He has been compliant with this medication. Denies chest pain. He denies fluid retention/edema.   Past Medical History  Diagnosis Date  . Ruptured lumbar disc   . Arthritis   . High triglycerides   . Cardiomyopathy, ischemic, EF by cath 35% and Echo 35-40% with STEMI and PCI 12/21/2012  . CHF (congestive heart failure)     Acute combined systolic and diastolic heart failure  . ST elevation myocardial infarction (STEMI) of inferior wall 12/18/12    STEMI of inf. wall w/PCI with Xperdition stent to VG to distal RCA  . Abnormal echocardiogram 05/31/12    EF = 45-50% with more global hypokinesis. There was eccentric hypertrophy of the ventricle. There was a trace AI and mild MR.  . Obesity     unspecified  . Acute renal insufficiency   . Coronary artery disease     CABG in 1999 which was a 5-vessel bypass and had a questionable history of A fib. He underwent monitoring whick showed sinus bradycardia and PACs but no evidence of A fib.  Marland Kitchen Hypertension   . GERD (gastroesophageal reflux disease)    Past Surgical History  Procedure Laterality Date  . Lumbar disc surgery    . Tonsillectomy    . Back surgery      (5) back surgeries  . Coronary artery bypass graft  1999    (CAD) CABG was a 5-vessel bypass and had a questionable history of A fib. He underwent monitoring whick showed sinus  bradycardia and PACs but no evidence of A fib.  Marland Kitchen Open heart surgery  03/1998   Family History  Problem Relation Age of Onset  . CAD Mother   . Heart disease Mother   . CAD Brother   . Heart attack Brother   . Hyperlipidemia Sister   . Leukemia Father   . CAD Maternal Grandmother   . Brain cancer Brother   . CAD Sister    History  Substance Use Topics  . Smoking status: Former Smoker -- 4.50 packs/day for 30 years    Quit date: 05/07/1987  . Smokeless tobacco: Former Neurosurgeon     Comment: quit 25 yrs ago  . Alcohol Use: No    Review of Systems Ten point review of symptoms performed and is negative with the exception of symptoms noted above.   Allergies  Aspirin and Other  Home Medications   Current Outpatient Rx  Name  Route  Sig  Dispense  Refill  . acetaminophen (TYLENOL) 325 MG tablet   Oral   Take 2 tablets (650 mg total) by mouth every 4 (four) hours as needed.         Marland Kitchen aspirin EC 81 MG EC tablet   Oral   Take 1 tablet (81 mg total) by mouth daily.         Marland Kitchen atorvastatin (LIPITOR) 80 MG tablet  Oral   Take 1 tablet (80 mg total) by mouth daily at 6 PM.   30 tablet   11   . azithromycin (ZITHROMAX) 250 MG tablet   Oral   Take 250-500 mg by mouth daily. Pt started on 05-15-13 for 5 day therapy. On day 1 of therapy         . furosemide (LASIX) 20 MG tablet   Oral   Take 1 tablet (20 mg total) by mouth daily.   30 tablet   11   . isosorbide mononitrate (IMDUR) 30 MG 24 hr tablet   Oral   Take 1 tablet (30 mg total) by mouth daily.   30 tablet   11   . lisinopril (PRINIVIL,ZESTRIL) 5 MG tablet      TAKE 1 TABLET DAILY ORALLY   30 tablet   7   . LORazepam (ATIVAN) 0.5 MG tablet   Oral   Take 1 tablet (0.5 mg total) by mouth 2 (two) times daily as needed for anxiety.   60 tablet   3   . metoprolol tartrate (LOPRESSOR) 25 MG tablet   Oral   Take 12.5 mg by mouth 2 (two) times daily.         . ranitidine (ZANTAC) 150 MG tablet    Oral   Take 150 mg by mouth 2 (two) times daily.         . Ticagrelor (BRILINTA) 90 MG TABS tablet   Oral   Take 1 tablet (90 mg total) by mouth 2 (two) times daily.   64 tablet   0   . naproxen (NAPROSYN) 250 MG tablet   Oral   Take 1 tablet (250 mg total) by mouth 2 (two) times daily with a meal.   60 tablet   7   . nitroGLYCERIN (NITROSTAT) 0.4 MG SL tablet   Sublingual   Place 1 tablet (0.4 mg total) under the tongue every 5 (five) minutes as needed for chest pain.   25 tablet   3    BP 122/82  Pulse 89  Temp(Src) 98.4 F (36.9 C)  Resp 20  Ht 5\' 9"  (1.753 m)  Wt 282 lb (127.914 kg)  BMI 41.63 kg/m2  SpO2 92% Physical Exam Gen: well developed and well nourished appearing Head: NCAT Eyes: PERL, EOMI Nose: no epistaixis or rhinorrhea Mouth/throat: mucosa is moist and pink Neck: supple, no stridor Lungs: CTA B, scattered wheezing, RR 24/min, no rhonchi or rales CV: regular rate and rythm, good distal pulses.  Abd: soft, notender, nondistended Back: no ttp, no cva ttp Skin: warm and dry Ext: no edema, normal to inspection Neuro: CN ii-xii grossly intact, no focal deficits Psyche; normal affect,  calm and cooperative. ED Course  Procedures (including critical care time) Labs Review  Results for orders placed during the hospital encounter of 05/16/13 (from the past 24 hour(s))  POCT I-STAT, CHEM 8     Status: Abnormal   Collection Time    05/16/13  3:53 AM      Result Value Range   Sodium 142  135 - 145 mEq/L   Potassium 3.5  3.5 - 5.1 mEq/L   Chloride 102  96 - 112 mEq/L   BUN 19  6 - 23 mg/dL   Creatinine, Ser 1.61  0.50 - 1.35 mg/dL   Glucose, Bld 096 (*) 70 - 99 mg/dL   Calcium, Ion 0.45  4.09 - 1.30 mmol/L   TCO2 26  0 - 100 mmol/L  Hemoglobin 13.9  13.0 - 17.0 g/dL   HCT 78.2  95.6 - 21.3 %  POCT I-STAT TROPONIN I     Status: None   Collection Time    05/16/13  4:03 AM      Result Value Range   Troponin i, poc 0.00  0.00 - 0.08 ng/mL    Comment 3             Imaging Review Dg Chest 2 View  05/15/2013   CLINICAL DATA:  Chest pain.  Congestion and short of breath.  EXAM: CHEST  2 VIEW  COMPARISON:  07/01/2012.  FINDINGS: Cardiomegaly is present. CABG/median sternotomy. Low lung volumes are present with basilar atelectasis. No airspace disease. No effusion.  IMPRESSION: Cardiomegaly without failure.   Electronically Signed   By: Andreas Newport M.D.   On: 05/15/2013 23:08    EKG: nsr, no acute ischemic changes, normal intervals, normal axis, normal qrs complex  MDM  Patient with bronchitis causing bronchospasm. No signs of pulmonary edema on xray or exam. No signs of infiltrate on CXR. The patient has improved substantially with resolution of wheezing after tx with Albuterol. He is stable for discharge with plan to continue Zpack, as prescribed by PCP, and addition of prednisone for bronchospasm. Patient has an albuterol MDI at home and is advised to use this on a q4hr basis until his sx resolved. Counseled to return to the ED for red flag sx and to f/u with his PCP.     Brandt Loosen, MD 05/16/13 [phone]

## 2013-07-04 ENCOUNTER — Encounter: Payer: Self-pay | Admitting: Internal Medicine

## 2013-07-06 ENCOUNTER — Ambulatory Visit (INDEPENDENT_AMBULATORY_CARE_PROVIDER_SITE_OTHER): Payer: Medicare Other | Admitting: Internal Medicine

## 2013-07-06 ENCOUNTER — Encounter: Payer: Self-pay | Admitting: Internal Medicine

## 2013-07-06 VITALS — BP 130/80 | HR 70 | Ht 71.0 in | Wt 286.3 lb

## 2013-07-06 DIAGNOSIS — E669 Obesity, unspecified: Secondary | ICD-10-CM | POA: Diagnosis not present

## 2013-07-06 DIAGNOSIS — I25810 Atherosclerosis of coronary artery bypass graft(s) without angina pectoris: Secondary | ICD-10-CM

## 2013-07-06 DIAGNOSIS — I2589 Other forms of chronic ischemic heart disease: Secondary | ICD-10-CM | POA: Diagnosis not present

## 2013-07-06 DIAGNOSIS — E785 Hyperlipidemia, unspecified: Secondary | ICD-10-CM

## 2013-07-06 DIAGNOSIS — I2510 Atherosclerotic heart disease of native coronary artery without angina pectoris: Secondary | ICD-10-CM

## 2013-07-06 DIAGNOSIS — I255 Ischemic cardiomyopathy: Secondary | ICD-10-CM

## 2013-07-06 MED ORDER — TICAGRELOR 90 MG PO TABS: 90.0000 mg | ORAL_TABLET | Freq: Two times a day (BID) | ORAL | Status: DC

## 2013-07-06 MED ORDER — LISINOPRIL 5 MG PO TABS: 5.0000 mg | ORAL_TABLET | Freq: Every day | ORAL | Status: DC

## 2013-07-06 MED ORDER — FUROSEMIDE 20 MG PO TABS: 20.0000 mg | ORAL_TABLET | Freq: Every day | ORAL | Status: DC

## 2013-07-06 MED ORDER — METOPROLOL TARTRATE 25 MG PO TABS: 12.5000 mg | ORAL_TABLET | Freq: Two times a day (BID) | ORAL | Status: DC

## 2013-07-06 MED ORDER — ISOSORBIDE MONONITRATE ER 30 MG PO TB24: 30.0000 mg | ORAL_TABLET | Freq: Every day | ORAL | Status: DC

## 2013-07-06 NOTE — Patient Instructions (Addendum)
Your physician has requested that you have an echocardiogram. Echocardiography is a painless test that uses sound waves to create images of your heart. It provides your doctor with information about the size and shape of your heart and how well your heart's chambers and valves are working. This procedure takes approximately one hour. There are no restrictions for this procedure.  Your physician has recommended you make the following change in your medication: STOP naproxen. DECREASE atorvastatin to 40mg  once daily.   Your physician wants you to follow-up in: 6 months. You will receive a reminder letter in the mail two months in advance. If you don't receive a letter, please call our office to schedule the follow-up appointment.

## 2013-07-06 NOTE — Progress Notes (Signed)
OFFICE NOTE  Chief Complaint:  Hospital follow-up  Primary Care Physician: Thayer HeadingsMACKENZIE,BRIAN, MD  HPI:  Kerby MoorsVernon Pedregon is 73 y/o with a history of CAD, s/p CABG X 4 in 1999 with an LIMA-LAD, SVG-OM, SVG-Dx, SVG-RCA. [facility] with a STEMI and was taken to the cath lab by Dr Tresa EndoKelly. This revealed the LIMA to the LAD to be patent with collaterals to the Dx. The SVG- OM: was occluded and the SVG- DX: was occluded The culprit appeared to be occlusion of SVG to distal RCA with no flow proximal due to anastomis occlusion with thrombus. He underwent a very difficult PCI with PTCA/ thrombectomy. He had no flow phenomenon requiring IC/IV NTG/IC verapamil/ angiomax, brilinta 180 mg, integrelin. He ultimately received a Xience Xpedition stent to distal anastomosis. He did have acute, transient CHF but stabilized with diuresis. He did well and was transferred to telemetry on 12/21/12.. Troponin was greater than 20. EF at cath was 35%. EF by echo was 35=40% with severe hypokinesis of the basal inferolateral myocardium; moderate hypokinesis of the basal-mid inferior and mid inferolateral myocardium; and mild hypokinesis of the basal-mid inferoseptal and apical septal myocardium. After he was transferred to telemetry he awaked early in the morning of the 30th with neck pain. He was transferred back to the ICU by the cardiology fellow on call. The pt was seen and examined by Dr Rennis GoldenHilty the morning of the 29th. Dr Rennis GoldenHilty did not feel his symptoms were cardiac. We added a NSAID and Skelaxin. He took this for a day or 2 he reported his neck pain improved significantly.  Today is here for followup and appears to be doing fairly well. He does report some shortness of breath doing moderate to more intense activities which he is trying to avoid. He did some lawn mowing which made her short of breath after a while. 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 He does feel that his energy  level has improved significantly and hopefully this means that his EF has come up as well. He continues to take naproxen on a daily basis, however it was intended that he only take this for a short period time for his neck pain. He did however discontinue Skelaxin. I reviewed recent laboratory work from his primary care provider today including a cholesterol profile. I was surprised to see how low that was, noting that his total cholesterol was 80, triglycerides 107 HDL 35 and LDL of 24. This is on atorvastatin 80 mg daily.  PMHx:  Past Medical History  Diagnosis Date  . Ruptured lumbar disc   . Arthritis   . High triglycerides   . Cardiomyopathy, ischemic, EF by cath 35% and Echo 35-40% with STEMI and PCI 12/21/2012  . CHF (congestive heart failure)     Acute combined systolic and diastolic heart failure  . ST elevation myocardial infarction (STEMI) of inferior wall 12/18/12    STEMI of inf. wall w/PCI with Xperdition stent to VG to distal RCA  . Abnormal echocardiogram 05/31/12    EF = 45-50% with more global hypokinesis. There was eccentric hypertrophy of the ventricle. There was a trace AI and mild MR.  . Obesity     unspecified  . Acute renal insufficiency   . Coronary artery disease     CABG in 1999 which was a 5-vessel bypass and had a questionable history of A fib. He underwent monitoring whick showed sinus bradycardia and PACs but no  evidence of A fib.  Marland Kitchen Hypertension   . GERD (gastroesophageal reflux disease)     Past Surgical History  Procedure Laterality Date  . Lumbar disc surgery    . Tonsillectomy    . Back surgery      (5) back surgeries  . Coronary artery bypass graft  1999    (CAD) CABG was a 5-vessel bypass and had a questionable history of A fib. He underwent monitoring whick showed sinus bradycardia and PACs but no evidence of A fib.  Marland Kitchen Open heart surgery  03/1998    FAMHx:  Family History  Problem Relation Age of Onset  . CAD Mother   . Heart disease Mother     . CAD Brother   . Heart attack Brother   . Hyperlipidemia Sister   . Leukemia Father   . CAD Maternal Grandmother   . Brain cancer Brother   . CAD Sister     SOCHx:   reports that he quit smoking about 26 years ago. He has quit using smokeless tobacco. He reports that he does not drink alcohol or use illicit drugs.  ALLERGIES:  Allergies  Allergen Reactions  . Aspirin Nausea And Vomiting    Can't take full strength uncoated aspirin  . Other Other (See Comments)    Pain medication that he was given after open heart surgery-sweating and hallucinations     ROS: A comprehensive review of systems was negative except for: Behavioral/Psych: positive for anxiety  HOME MEDS: Current Outpatient Prescriptions  Medication Sig Dispense Refill  . acetaminophen (TYLENOL) 325 MG tablet Take 2 tablets (650 mg total) by mouth every 4 (four) hours as needed.      Marland Kitchen aspirin EC 81 MG EC tablet Take 1 tablet (81 mg total) by mouth daily.      Marland Kitchen atorvastatin (LIPITOR) 80 MG tablet Take 40 mg by mouth daily at 6 PM.      . furosemide (LASIX) 20 MG tablet Take 1 tablet (20 mg total) by mouth daily.  30 tablet  11  . isosorbide mononitrate (IMDUR) 30 MG 24 hr tablet Take 1 tablet (30 mg total) by mouth daily.  30 tablet  11  . lisinopril (PRINIVIL,ZESTRIL) 5 MG tablet Take 1 tablet (5 mg total) by mouth daily.  30 tablet  11  . LORazepam (ATIVAN) 0.5 MG tablet Take 0.5 mg by mouth every 8 (eight) hours as needed for anxiety.      . metoprolol tartrate (LOPRESSOR) 25 MG tablet Take 0.5 tablets (12.5 mg total) by mouth 2 (two) times daily.  30 tablet  11  . nitroGLYCERIN (NITROSTAT) 0.4 MG SL tablet Place 1 tablet (0.4 mg total) under the tongue every 5 (five) minutes as needed for chest pain.  25 tablet  3  . ranitidine (ZANTAC) 150 MG tablet Take 150 mg by mouth 2 (two) times daily.      . Ticagrelor (BRILINTA) 90 MG TABS tablet Take 1 tablet (90 mg total) by mouth 2 (two) times daily.  64 tablet  0    No current facility-administered medications for this visit.    LABS/IMAGING: No results found for this or any previous visit (from the past 48 hour(s)). No results found.  VITALS: BP 130/80  Pulse 70  Ht 5\' 11"  (1.803 m)  Wt 286 lb 4.8 oz (129.865 kg)  BMI 39.95 kg/m2  EXAM: General appearance: alert and no distress Neck: no adenopathy, no carotid bruit, no JVD, supple, symmetrical, trachea midline and thyroid  not enlarged, symmetric, no tenderness/mass/nodules Lungs: clear to auscultation bilaterally Heart: regular rate and rhythm, S1, S2 normal, no murmur, click, rub or gallop Abdomen: soft, non-tender; bowel sounds normal; no masses,  no organomegaly Extremities: extremities normal, atraumatic, no cyanosis or edema Pulses: 2+ and symmetric Skin: Skin color, texture, turgor normal. No rashes or lesions Neurologic: Grossly normal  EKG: Normal sinus rhythm at 70, Inferior Q waves  ASSESSMENT: 1. Coronary artery disease status post CABG with ST elevation MI and stent placement to the vein graft to PDA, with occluded grafts to the OM and diagonal vessels and a patent LIMA to LAD 2. 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 Hypertension 4. Dyslipidemia - overtreated  PLAN: 1.   Mr. Neil Crouch is doing well.  He reports an improvement in his energy and activity level. His EKG does show inferior Q waves I suspect he did have a small MI. Hopefully his EF has improved, but would like to recheck an echocardiogram as it's been more than 6 months since his last echocardiogram. Based on his abnormal a low cholesterol profile, I would actually recommend decreasing his Lipitor to 40 mg daily. Finally, I've advised him to stop taking daily naproxen as there is no continued need for that and possibly a small increase cardiovascular risk from daily use of this.  I will contact you with the results of his echocardiogram to plan to see him back in 6 months or sooner as needed   Chrystie Nose, MD,  [facility] Attending Cardiologist [facility]  HILTY,Kenneth C 07/06/2013, 5:35 PM

## 2013-07-28 ENCOUNTER — Other Ambulatory Visit (HOSPITAL_COMMUNITY): Payer: Self-pay | Admitting: Internal Medicine

## 2013-08-11 ENCOUNTER — Ambulatory Visit (HOSPITAL_COMMUNITY)
Admission: RE | Admit: 2013-08-11 | Discharge: 2013-08-11 | Disposition: A | Discharge status: Home / Self Care | Payer: Medicare Other | Source: Ambulatory Visit | Attending: Cardiovascular Disease | Admitting: Cardiovascular Disease

## 2013-08-11 DIAGNOSIS — I2589 Other forms of chronic ischemic heart disease: Secondary | ICD-10-CM | POA: Diagnosis not present

## 2013-08-11 DIAGNOSIS — I059 Rheumatic mitral valve disease, unspecified: Secondary | ICD-10-CM

## 2013-08-11 DIAGNOSIS — I255 Ischemic cardiomyopathy: Secondary | ICD-10-CM

## 2013-08-11 NOTE — Progress Notes (Signed)
2D Echo Performed 08/11/2013    Blessin Kanno, RCS  

## 2013-08-15 ENCOUNTER — Telehealth: Payer: Self-pay | Admitting: Internal Medicine

## 2013-08-15 NOTE — Telephone Encounter (Signed)
Returned call.  Notified of echo results.

## 2013-08-15 NOTE — Telephone Encounter (Signed)
Message forwarded to Jenna, RN.  

## 2013-08-15 NOTE — Progress Notes (Signed)
LMTCB regarding test results

## 2013-08-15 NOTE — Telephone Encounter (Signed)
Returning somebody call,he said he did not get the name of who called.

## 2013-09-21 DIAGNOSIS — R062 Wheezing: Secondary | ICD-10-CM | POA: Diagnosis not present

## 2013-09-21 DIAGNOSIS — J019 Acute sinusitis, unspecified: Secondary | ICD-10-CM | POA: Diagnosis not present

## 2013-11-10 DIAGNOSIS — E559 Vitamin D deficiency, unspecified: Secondary | ICD-10-CM | POA: Diagnosis not present

## 2013-11-10 DIAGNOSIS — I10 Essential (primary) hypertension: Secondary | ICD-10-CM | POA: Diagnosis not present

## 2013-11-10 DIAGNOSIS — E1129 Type 2 diabetes mellitus with other diabetic kidney complication: Secondary | ICD-10-CM | POA: Diagnosis not present

## 2013-11-17 ENCOUNTER — Other Ambulatory Visit (HOSPITAL_COMMUNITY): Payer: Self-pay | Admitting: Cardiology

## 2013-11-17 DIAGNOSIS — E785 Hyperlipidemia, unspecified: Secondary | ICD-10-CM | POA: Diagnosis not present

## 2013-11-17 DIAGNOSIS — I2510 Atherosclerotic heart disease of native coronary artery without angina pectoris: Secondary | ICD-10-CM | POA: Diagnosis not present

## 2013-11-17 DIAGNOSIS — E1129 Type 2 diabetes mellitus with other diabetic kidney complication: Secondary | ICD-10-CM | POA: Diagnosis not present

## 2013-11-17 DIAGNOSIS — I10 Essential (primary) hypertension: Secondary | ICD-10-CM | POA: Diagnosis not present

## 2013-11-17 NOTE — Telephone Encounter (Signed)
Rx refill denied, Rx was refilled 07/06/13 for #30 with 11 refills

## 2013-12-06 ENCOUNTER — Other Ambulatory Visit (HOSPITAL_COMMUNITY): Payer: Self-pay | Admitting: Cardiology

## 2013-12-14 ENCOUNTER — Other Ambulatory Visit (HOSPITAL_COMMUNITY): Payer: Self-pay | Admitting: Cardiology

## 2013-12-15 NOTE — Telephone Encounter (Signed)
Medication was refilled on 07/06/2013 for #30 with 11 refills. Requested refills too soon. Refills refused.

## 2014-01-23 ENCOUNTER — Ambulatory Visit (INDEPENDENT_AMBULATORY_CARE_PROVIDER_SITE_OTHER): Payer: Medicare Other | Admitting: Internal Medicine

## 2014-01-23 ENCOUNTER — Encounter: Payer: Self-pay | Admitting: Internal Medicine

## 2014-01-23 VITALS — BP 128/72 | HR 61 | Ht 74.0 in | Wt 283.9 lb

## 2014-01-23 DIAGNOSIS — E785 Hyperlipidemia, unspecified: Secondary | ICD-10-CM | POA: Diagnosis not present

## 2014-01-23 DIAGNOSIS — I2589 Other forms of chronic ischemic heart disease: Secondary | ICD-10-CM | POA: Diagnosis not present

## 2014-01-23 DIAGNOSIS — I2119 ST elevation (STEMI) myocardial infarction involving other coronary artery of inferior wall: Secondary | ICD-10-CM | POA: Diagnosis not present

## 2014-01-23 DIAGNOSIS — I2510 Atherosclerotic heart disease of native coronary artery without angina pectoris: Secondary | ICD-10-CM | POA: Diagnosis not present

## 2014-01-23 DIAGNOSIS — I2111 ST elevation (STEMI) myocardial infarction involving right coronary artery: Secondary | ICD-10-CM

## 2014-01-23 DIAGNOSIS — I25810 Atherosclerosis of coronary artery bypass graft(s) without angina pectoris: Secondary | ICD-10-CM | POA: Diagnosis not present

## 2014-01-23 DIAGNOSIS — I255 Ischemic cardiomyopathy: Secondary | ICD-10-CM

## 2014-01-23 MED ORDER — TICAGRELOR 90 MG PO TABS: 90.0000 mg | ORAL_TABLET | Freq: Two times a day (BID) | ORAL | Status: DC

## 2014-01-23 NOTE — Progress Notes (Signed)
Patient ID: Ryan Wise, male   DOB: [birth year], 73 y.o.   MRN: [id number]    OFFICE NOTE  Chief Complaint:  Routine follow-up  Primary Care Physician: Thayer Headings, MD  HPI:  Ryan Wise is 73 y/o with a history of CAD, s/p CABG X 4 in 1999 with an LIMA-LAD, SVG-OM, SVG-Dx, SVG-RCA. He presented to [facility] 12/18/12 with a STEMI and was taken to the cath lab by Dr Tresa Endo. This revealed the LIMA to the LAD to be patent with collaterals to the Dx. The SVG- OM: was occluded and the SVG- DX: was occluded The culprit appeared to be occlusion of SVG to distal RCA with no flow proximal due to anastomis occlusion with thrombus. He underwent a very difficult PCI with PTCA/ thrombectomy. He had no flow phenomenon requiring IC/IV NTG/IC verapamil/ angiomax, brilinta 180 mg, integrelin. He ultimately received a Xience Xpedition stent to distal anastomosis. He did have acute, transient CHF but stabilized with diuresis. He did well and was transferred to telemetry on 12/21/12.. Troponin was greater than 20. EF at cath was 35%. EF by echo was 35=40% with severe hypokinesis of the basal inferolateral myocardium; moderate hypokinesis of the basal-mid inferior and mid inferolateral myocardium; and mild hypokinesis of the basal-mid inferoseptal and apical septal myocardium. After he was transferred to telemetry he awaked early in the morning of the 30th with neck pain. He was transferred back to the ICU by the cardiology fellow on call. The pt was seen and examined by Dr Rennis Golden the morning of the 29th. Dr Rennis Golden did not feel his symptoms were cardiac. We added a NSAID and Skelaxin. He took this for a day or 2 he reported his neck pain improved significantly.   Today he is here for follow-up and reports no complaints. Denies chest pain, orthopnea, PND, syncope, pre-syncope. Reports he is working everyday. 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 Notes some shortness of breath after taking lasix and can't get to bathroom. Though gets better after he is able to get to a bathroom.  PMHx:  Past Medical History  Diagnosis Date  . Ruptured lumbar disc   . Arthritis   . High triglycerides   . Cardiomyopathy, ischemic, EF by cath 35% and Echo 35-40% with STEMI and PCI 12/21/2012  . CHF (congestive heart failure)     Acute combined systolic and diastolic heart failure  . ST elevation myocardial infarction (STEMI) of inferior wall 12/18/12    STEMI of inf. wall w/PCI with Xperdition stent to VG to distal RCA  . Abnormal echocardiogram 05/31/12    EF = 45-50% with more global hypokinesis. There was eccentric hypertrophy of the ventricle. There was a trace AI and mild MR.  . Obesity     unspecified  . Acute renal insufficiency   . Coronary artery disease     CABG in 1999 which was a 5-vessel bypass and had a questionable history of A fib. He underwent monitoring whick showed sinus bradycardia and PACs but no evidence of A fib.  Marland Kitchen Hypertension   . GERD (gastroesophageal reflux disease)     Past Surgical History  Procedure Laterality Date  . Lumbar disc surgery    . Tonsillectomy    . Back surgery      (5) back surgeries  . Coronary artery bypass graft  1999    (CAD) CABG was a 5-vessel bypass and had a questionable history of  A fib. He underwent monitoring whick showed sinus bradycardia and PACs but no evidence of A fib.  Marland Kitchen Open heart surgery  03/1998    FAMHx:  Family History  Problem Relation Age of Onset  . CAD Mother   . Heart disease Mother   . CAD Brother   . Heart attack Brother   . Hyperlipidemia Sister   . Leukemia Father   . CAD Maternal Grandmother   . Brain cancer Brother   . CAD Sister     SOCHx:   reports that he quit smoking about 26 years ago. He has quit using smokeless tobacco. His smokeless tobacco use included Chew. He reports that he does not drink alcohol or use illicit drugs.  ALLERGIES:    Allergies  Allergen Reactions  . Aspirin Nausea And Vomiting    Can't take full strength uncoated aspirin  . Other Other (See Comments)    Pain medication that he was given after open heart surgery-sweating and hallucinations     ROS: A comprehensive review of systems was negative.  HOME MEDS: Current Outpatient Prescriptions  Medication Sig Dispense Refill  . acetaminophen (TYLENOL) 325 MG tablet Take 2 tablets (650 mg total) by mouth every 4 (four) hours as needed.      Marland Kitchen aspirin EC 81 MG EC tablet Take 1 tablet (81 mg total) by mouth daily.      Marland Kitchen atorvastatin (LIPITOR) 80 MG tablet Take 40 mg by mouth daily at 6 PM.      . furosemide (LASIX) 20 MG tablet Take 1 tablet (20 mg total) by mouth daily.  30 tablet  11  . isosorbide mononitrate (IMDUR) 30 MG 24 hr tablet Take 1 tablet (30 mg total) by mouth daily.  30 tablet  11  . lisinopril (PRINIVIL,ZESTRIL) 5 MG tablet Take 1 tablet (5 mg total) by mouth daily.  30 tablet  11  . LORazepam (ATIVAN) 0.5 MG tablet Take 0.5 mg by mouth every 8 (eight) hours as needed for anxiety.      . metoprolol tartrate (LOPRESSOR) 25 MG tablet Take 0.5 tablets (12.5 mg total) by mouth 2 (two) times daily.  30 tablet  11  . nitroGLYCERIN (NITROSTAT) 0.4 MG SL tablet Place 1 tablet (0.4 mg total) under the tongue every 5 (five) minutes as needed for chest pain.  25 tablet  3  . ranitidine (ZANTAC) 150 MG tablet Take 150 mg by mouth 2 (two) times daily.      . Ticagrelor (BRILINTA) 90 MG TABS tablet Take 1 tablet (90 mg total) by mouth 2 (two) times daily.  64 tablet  0   No current facility-administered medications for this visit.    LABS/IMAGING: No results found for this or any previous visit (from the past 48 hour(s)). No results found.  VITALS: BP 128/72  Pulse 61  Ht  (1.88 m)  Wt 283 lb 14.4 oz (128.776 kg)  BMI 36.44 kg/m2  EXAM: General appearance: alert, cooperative and no distress Neck: no adenopathy, no carotid bruit, no JVD  and supple, symmetrical, trachea midline Lungs: clear to auscultation bilaterally Heart: regular rate and rhythm, S1, S2 normal, no murmur, click, rub or gallop Extremities: extremities normal, atraumatic, no cyanosis or edema Pulses: 2+ and symmetric radial Skin: Skin color, texture, turgor normal. No rashes or lesions Neurologic: Grossly normal  EKG: NSR with 1st degree AV block, PR interval 242, Q waves present inferiorly  ASSESSMENT: 1. CAD s/p CABG with STEMI and stent placement to  the vein graft to PDA, with occluded grfts to the OM and diagonal vessels and a patent LIMA to LAD 2. 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 HTN 4. Dyslipidemia  PLAN: 1.   Patient is doing well at this time. He has had improvement in energy level and ability to do physical activity with no symptoms. He had an echo in March 2015 that was essentially unchanged with EF 35-40%. At his last visit his lipitor was decreased to 40 mg daily due to LDL of 24. He reports he recently had lab work with his PCP and we will request those lab results to monitor his lipids. We discussed limiting his water intake to less than 2 L daily. Will continue to monitor his cardiac function with yearly echo unless he develops symptoms. Additionally discussed his being around chain saws while being on antiplatelet medications and the risk of bleeding with this. He will follow-up in 6 months.   Marikay Alar 01/23/2014, 3:38 PM  Pt. Seen and examined. Agree with the resident note as written. Mr. Neil Crouch continues to do well. He has had 3 pounds of weight loss recently. His LDL was actually very low and I decreased his cholesterol medicine. He is scheduled to get repeat laboratory work through his primary care provider. We again asked him to be careful while using chain saws and provided samples of Brillinta today. Plan followup in 6 months or sooner as necessary.  Chrystie Nose, MD, [employer] Attending Cardiologist [facility]

## 2014-01-23 NOTE — Patient Instructions (Signed)
Your physician wants you to follow-up in:  6 months. You will receive a reminder letter in the mail two months in advance. If you don't receive a letter, please call our office to schedule the follow-up appointment.   

## 2014-01-31 ENCOUNTER — Encounter: Payer: Self-pay | Admitting: Internal Medicine

## 2014-03-12 ENCOUNTER — Other Ambulatory Visit (HOSPITAL_COMMUNITY): Payer: Self-pay | Admitting: Cardiology

## 2014-03-13 NOTE — Telephone Encounter (Signed)
Rx was sent to pharmacy electronically. 

## 2014-03-27 ENCOUNTER — Telehealth: Payer: Self-pay | Admitting: *Deleted

## 2014-03-27 MED ORDER — TICAGRELOR 90 MG PO TABS: 90.0000 mg | ORAL_TABLET | Freq: Two times a day (BID) | ORAL | Status: DC

## 2014-03-27 NOTE — Telephone Encounter (Signed)
Daughter walked in needing [employer] form completed and signed by Dr. Rennis GoldenHilty for patient to get Brilinta for free.   Form completed and given to daughter.   Samples provided.

## 2014-05-04 ENCOUNTER — Encounter (HOSPITAL_COMMUNITY): Payer: Self-pay | Admitting: Cardiovascular Disease

## 2014-05-16 DIAGNOSIS — Z23 Encounter for immunization: Secondary | ICD-10-CM | POA: Diagnosis not present

## 2014-05-16 DIAGNOSIS — Z0000 Encounter for general adult medical examination without abnormal findings: Secondary | ICD-10-CM | POA: Diagnosis not present

## 2014-05-16 DIAGNOSIS — Z1389 Encounter for screening for other disorder: Secondary | ICD-10-CM | POA: Diagnosis not present

## 2014-05-16 DIAGNOSIS — I10 Essential (primary) hypertension: Secondary | ICD-10-CM | POA: Diagnosis not present

## 2014-05-16 DIAGNOSIS — E1121 Type 2 diabetes mellitus with diabetic nephropathy: Secondary | ICD-10-CM | POA: Diagnosis not present

## 2014-05-16 DIAGNOSIS — E559 Vitamin D deficiency, unspecified: Secondary | ICD-10-CM | POA: Diagnosis not present

## 2014-05-16 DIAGNOSIS — Z125 Encounter for screening for malignant neoplasm of prostate: Secondary | ICD-10-CM | POA: Diagnosis not present

## 2014-05-23 DIAGNOSIS — I10 Essential (primary) hypertension: Secondary | ICD-10-CM | POA: Diagnosis not present

## 2014-05-23 DIAGNOSIS — I2510 Atherosclerotic heart disease of native coronary artery without angina pectoris: Secondary | ICD-10-CM | POA: Diagnosis not present

## 2014-05-23 DIAGNOSIS — E1121 Type 2 diabetes mellitus with diabetic nephropathy: Secondary | ICD-10-CM | POA: Diagnosis not present

## 2014-05-23 DIAGNOSIS — E785 Hyperlipidemia, unspecified: Secondary | ICD-10-CM | POA: Diagnosis not present

## 2014-06-28 ENCOUNTER — Other Ambulatory Visit: Payer: Self-pay | Admitting: Internal Medicine

## 2014-06-28 NOTE — Telephone Encounter (Signed)
Rx has been sent to the pharmacy electronically. ° °

## 2014-07-13 ENCOUNTER — Encounter: Payer: Self-pay | Admitting: Internal Medicine

## 2014-07-13 ENCOUNTER — Ambulatory Visit (INDEPENDENT_AMBULATORY_CARE_PROVIDER_SITE_OTHER): Payer: Medicare Other | Admitting: Internal Medicine

## 2014-07-13 VITALS — BP 122/60 | HR 63 | Ht 73.0 in | Wt 282.1 lb

## 2014-07-13 DIAGNOSIS — I5041 Acute combined systolic (congestive) and diastolic (congestive) heart failure: Secondary | ICD-10-CM | POA: Diagnosis not present

## 2014-07-13 DIAGNOSIS — E785 Hyperlipidemia, unspecified: Secondary | ICD-10-CM

## 2014-07-13 DIAGNOSIS — I255 Ischemic cardiomyopathy: Secondary | ICD-10-CM

## 2014-07-13 DIAGNOSIS — I25810 Atherosclerosis of coronary artery bypass graft(s) without angina pectoris: Secondary | ICD-10-CM | POA: Diagnosis not present

## 2014-07-13 NOTE — Progress Notes (Signed)
OFFICE NOTE  Chief Complaint:  No complaints  Primary Care Physician: Thayer Headings, MD  HPI:  Ryan Wise is 74 y/o with a history of CAD, s/p CABG X 4 in 1999 with an LIMA-LAD, SVG-OM, SVG-Dx, SVG-RCA. He presented to [facility] 12/18/12 with a STEMI and was taken to the cath lab by Dr Tresa Endo. This revealed the LIMA to the LAD to be patent with collaterals to the Dx. The SVG- OM: was occluded and the SVG- DX: was occluded The culprit appeared to be occlusion of SVG to distal RCA with no flow proximal due to anastomis occlusion with thrombus. He underwent a very difficult PCI with PTCA/ thrombectomy. He had no flow phenomenon requiring IC/IV NTG/IC verapamil/ angiomax, brilinta 180 mg, integrelin. He ultimately received a Xience Xpedition stent to distal anastomosis. He did have acute, transient CHF but stabilized with diuresis. He did well and was transferred to telemetry on 12/21/12.. Troponin was greater than 20. EF at cath was 35%. EF by echo was 35=40% with severe hypokinesis of the basal inferolateral myocardium; moderate hypokinesis of the basal-mid inferior and mid inferolateral myocardium; and mild hypokinesis of the basal-mid inferoseptal and apical septal myocardium. After he was transferred to telemetry he awaked early in the morning of the 30th with neck pain. He was transferred back to the ICU by the cardiology fellow on call. The pt was seen and examined by Dr Rennis Golden the morning of the 29th. Dr Rennis Golden did not feel his symptoms were cardiac. We added a NSAID and Skelaxin. He took this for a day or 2 he reported his neck pain improved significantly.  Today is here for followup and appears to be doing fairly well. He does report some shortness of breath doing moderate to more intense activities which he is trying to avoid. He did some lawn mowing which made her short of breath after a while. 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 He does feel that his energy level  has improved significantly and hopefully this means that his EF has come up as well. He continues to take naproxen on a daily basis, however it was intended that he only take this for a short period time for his neck pain. He did however discontinue Skelaxin. I reviewed recent laboratory work from his primary care provider today including a cholesterol profile. I was surprised to see how low that was, noting that his total cholesterol was 80, triglycerides 107 HDL 35 and LDL of 24. This is on atorvastatin 80 mg daily.  I saw [name] back in the office today. He continues to do very well. He is asymptomatic this had no significant weight gain or worsening shortness of breath. He denies any extremity edema. Blood pressure is very well-controlled today 122/60. He recently had laboratory work through his primary care provider which shows excellent cholesterol control. He does have a history of cardiomyopathy and is due for repeat echocardiogram.  PMHx:  Past Medical History  Diagnosis Date  . Ruptured lumbar disc   . Arthritis   . High triglycerides   . Cardiomyopathy, ischemic, EF by cath 35% and Echo 35-40% with STEMI and PCI 12/21/2012  . CHF (congestive heart failure)     Acute combined systolic and diastolic heart failure  . ST elevation myocardial infarction (STEMI) of inferior wall 12/18/12    STEMI of inf. wall w/PCI with Xperdition stent to VG to distal RCA  . Abnormal echocardiogram 05/31/12    EF =  45-50% with more global hypokinesis. There was eccentric hypertrophy of the ventricle. There was a trace AI and mild MR.  . Obesity     unspecified  . Acute renal insufficiency   . Coronary artery disease     CABG in 1999 which was a 5-vessel bypass and had a questionable history of A fib. He underwent monitoring whick showed sinus bradycardia and PACs but no evidence of A fib.  Marland Kitchen Hypertension   . GERD (gastroesophageal reflux disease)     Past Surgical History  Procedure Laterality Date    . Lumbar disc surgery    . Tonsillectomy    . Back surgery      (5) back surgeries  . Coronary artery bypass graft  1999    (CAD) CABG was a 5-vessel bypass and had a questionable history of A fib. He underwent monitoring whick showed sinus bradycardia and PACs but no evidence of A fib.  Marland Kitchen Open heart surgery  03/1998  . Left heart catheterization with coronary/graft angiogram  12/18/2012    Procedure: LEFT HEART CATHETERIZATION WITH Isabel Caprice;  Surgeon: [name], MD;  Location: [facility] CATH LAB;  Service: Cardiovascular;;    FAMHx:  Family History  Problem Relation Age of Onset  . CAD Mother   . Heart disease Mother   . CAD Brother   . Heart attack Brother   . Hyperlipidemia Sister   . Leukemia Father   . CAD Maternal Grandmother   . Brain cancer Brother   . CAD Sister     SOCHx:   reports that he quit smoking about 27 years ago. He has quit using smokeless tobacco. His smokeless tobacco use included Chew. He reports that he does not drink alcohol or use illicit drugs.  ALLERGIES:  Allergies  Allergen Reactions  . Aspirin Nausea And Vomiting    Can't take full strength uncoated aspirin  . Other Other (See Comments)    Pain medication that he was given after open heart surgery-sweating and hallucinations     ROS: A comprehensive review of systems was negative except for: Behavioral/Psych: positive for anxiety  HOME MEDS: Current Outpatient Prescriptions  Medication Sig Dispense Refill  . acetaminophen (TYLENOL) 325 MG tablet Take 2 tablets (650 mg total) by mouth every 4 (four) hours as needed.    Marland Kitchen aspirin EC 81 MG EC tablet Take 1 tablet (81 mg total) by mouth daily.    Marland Kitchen atorvastatin (LIPITOR) 80 MG tablet Take 40 mg by mouth daily at 6 PM.    . furosemide (LASIX) 20 MG tablet Take 1 tablet (20 mg total) by mouth daily. 30 tablet 11  . isosorbide mononitrate (IMDUR) 30 MG 24 hr tablet Take 1 tablet (30 mg total) by mouth daily. 30 tablet 11  .  lisinopril (PRINIVIL,ZESTRIL) 5 MG tablet TAKE 1 TABLET (5 MG TOTAL) BY MOUTH DAILY. 30 tablet 1  . LORazepam (ATIVAN) 0.5 MG tablet Take 0.5 mg by mouth every 8 (eight) hours as needed for anxiety.    . metoprolol tartrate (LOPRESSOR) 25 MG tablet Take 0.5 tablets (12.5 mg total) by mouth 2 (two) times daily. 30 tablet 11  . nitroGLYCERIN (NITROSTAT) 0.4 MG SL tablet Place 1 tablet (0.4 mg total) under the tongue every 5 (five) minutes as needed for chest pain. 25 tablet 3  . ranitidine (ZANTAC) 150 MG tablet Take 150 mg by mouth 2 (two) times daily.    . ticagrelor (BRILINTA) 90 MG TABS tablet Take 1 tablet (90  mg total) by mouth 2 (two) times daily. 16 tablet 0   No current facility-administered medications for this visit.    LABS/IMAGING: No results found for this or any previous visit (from the past 48 hour(s)). No results found.  VITALS: BP 122/60 mmHg  Pulse 63  Ht 6\' 1"  (1.854 m)  Wt 282 lb 1.6 oz (127.96 kg)  BMI 37.23 kg/m2  EXAM: General appearance: alert and no distress Neck: no adenopathy, no carotid bruit, no JVD, supple, symmetrical, trachea midline and thyroid not enlarged, symmetric, no tenderness/mass/nodules Lungs: clear to auscultation bilaterally Heart: regular rate and rhythm, S1, S2 normal, no murmur, click, rub or gallop Abdomen: soft, non-tender; bowel sounds normal; no masses,  no organomegaly Extremities: extremities normal, atraumatic, no cyanosis or edema Pulses: 2+ and symmetric Skin: Skin color, texture, turgor normal. No rashes or lesions Neurologic: Grossly normal  EKG: Sinus rhythm with first-degree AV block at 63, probable inferior infarct  ASSESSMENT: 1. Coronary artery disease status post CABG with ST elevation MI and stent placement to the vein graft to PDA, with occluded grafts to the OM and diagonal vessels and a patent LIMA to LAD 2. 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 Hypertension 4. Dyslipidemia  PLAN: 1.   Ryan Wise is doing  well. He continues to be asymptomatic and can do a decent amount of exercise and activity without becoming short of breath. He denies any chest pain. He's had no significant bleeding problems. He is due for another echocardiogram as it's been a year since his last echo in EF is 35-40%. Hopefully set some improvement on medications. We'll continue his current medications for now plan to see him back in 6 months.   Chrystie NoseKenneth C. Hilty, MD, [facility] SystemFACC Attending Cardiologist [facility]  HILTY,Kenneth C 07/13/2014, 4:30 PM

## 2014-07-13 NOTE — Patient Instructions (Signed)
Your physician wants you to follow-up in: 6 months with Dr. Hilty. You will receive a reminder letter in the mail two months in advance. If you don't receive a letter, please call our office to schedule the follow-up appointment.  Your physician has requested that you have an echocardiogram. Echocardiography is a painless test that uses sound waves to create images of your heart. It provides your doctor with information about the size and shape of your heart and how well your heart's chambers and valves are working. This procedure takes approximately one hour. There are no restrictions for this procedure.   

## 2014-07-17 ENCOUNTER — Ambulatory Visit (HOSPITAL_COMMUNITY)
Admission: RE | Admit: 2014-07-17 | Discharge: 2014-07-17 | Disposition: A | Discharge status: Home / Self Care | Payer: Medicare Other | Source: Ambulatory Visit | Attending: Cardiovascular Disease | Admitting: Cardiovascular Disease

## 2014-07-17 DIAGNOSIS — I255 Ischemic cardiomyopathy: Secondary | ICD-10-CM | POA: Diagnosis not present

## 2014-07-17 DIAGNOSIS — I25810 Atherosclerosis of coronary artery bypass graft(s) without angina pectoris: Secondary | ICD-10-CM | POA: Diagnosis not present

## 2014-07-17 NOTE — Progress Notes (Signed)
2D Echocardiogram Complete.  07/17/2014   Lucelia Lacey, RDCS  

## 2014-07-25 ENCOUNTER — Other Ambulatory Visit: Payer: Self-pay | Admitting: Internal Medicine

## 2014-07-25 NOTE — Telephone Encounter (Signed)
Rx refill sent to patient pharmacy   

## 2014-08-21 DIAGNOSIS — M5032 Other cervical disc degeneration, mid-cervical region: Secondary | ICD-10-CM | POA: Diagnosis not present

## 2014-08-21 DIAGNOSIS — M542 Cervicalgia: Secondary | ICD-10-CM | POA: Diagnosis not present

## 2014-08-21 DIAGNOSIS — M47812 Spondylosis without myelopathy or radiculopathy, cervical region: Secondary | ICD-10-CM | POA: Diagnosis not present

## 2014-08-21 DIAGNOSIS — M5031 Other cervical disc degeneration,  high cervical region: Secondary | ICD-10-CM | POA: Diagnosis not present

## 2014-08-22 ENCOUNTER — Other Ambulatory Visit: Payer: Self-pay | Admitting: Internal Medicine

## 2014-08-22 NOTE — Telephone Encounter (Signed)
Rx refill sent to patient pharmacy   

## 2014-08-27 ENCOUNTER — Emergency Department (HOSPITAL_COMMUNITY)
Admission: EM | Admit: 2014-08-27 | Discharge: 2014-08-27 | Disposition: A | Discharge status: Home / Self Care | Payer: Medicare Other | Attending: Emergency Medicine | Admitting: Emergency Medicine

## 2014-08-27 ENCOUNTER — Emergency Department (HOSPITAL_COMMUNITY): Payer: Medicare Other

## 2014-08-27 ENCOUNTER — Encounter (HOSPITAL_COMMUNITY): Payer: Self-pay

## 2014-08-27 DIAGNOSIS — I509 Heart failure, unspecified: Secondary | ICD-10-CM | POA: Diagnosis not present

## 2014-08-27 DIAGNOSIS — Z87891 Personal history of nicotine dependence: Secondary | ICD-10-CM | POA: Insufficient documentation

## 2014-08-27 DIAGNOSIS — I2510 Atherosclerotic heart disease of native coronary artery without angina pectoris: Secondary | ICD-10-CM | POA: Diagnosis not present

## 2014-08-27 DIAGNOSIS — E669 Obesity, unspecified: Secondary | ICD-10-CM | POA: Insufficient documentation

## 2014-08-27 DIAGNOSIS — M1990 Unspecified osteoarthritis, unspecified site: Secondary | ICD-10-CM | POA: Diagnosis not present

## 2014-08-27 DIAGNOSIS — R0789 Other chest pain: Secondary | ICD-10-CM | POA: Diagnosis not present

## 2014-08-27 DIAGNOSIS — Z951 Presence of aortocoronary bypass graft: Secondary | ICD-10-CM | POA: Diagnosis not present

## 2014-08-27 DIAGNOSIS — Z7982 Long term (current) use of aspirin: Secondary | ICD-10-CM | POA: Diagnosis not present

## 2014-08-27 DIAGNOSIS — R079 Chest pain, unspecified: Secondary | ICD-10-CM | POA: Insufficient documentation

## 2014-08-27 DIAGNOSIS — K219 Gastro-esophageal reflux disease without esophagitis: Secondary | ICD-10-CM | POA: Diagnosis not present

## 2014-08-27 DIAGNOSIS — I10 Essential (primary) hypertension: Secondary | ICD-10-CM | POA: Insufficient documentation

## 2014-08-27 DIAGNOSIS — R05 Cough: Secondary | ICD-10-CM | POA: Diagnosis not present

## 2014-08-27 DIAGNOSIS — R1011 Right upper quadrant pain: Secondary | ICD-10-CM | POA: Insufficient documentation

## 2014-08-27 DIAGNOSIS — I252 Old myocardial infarction: Secondary | ICD-10-CM | POA: Insufficient documentation

## 2014-08-27 DIAGNOSIS — Z9889 Other specified postprocedural states: Secondary | ICD-10-CM | POA: Insufficient documentation

## 2014-08-27 DIAGNOSIS — M5410 Radiculopathy, site unspecified: Secondary | ICD-10-CM

## 2014-08-27 DIAGNOSIS — M5416 Radiculopathy, lumbar region: Secondary | ICD-10-CM | POA: Insufficient documentation

## 2014-08-27 DIAGNOSIS — Z79899 Other long term (current) drug therapy: Secondary | ICD-10-CM | POA: Insufficient documentation

## 2014-08-27 DIAGNOSIS — Z87448 Personal history of other diseases of urinary system: Secondary | ICD-10-CM | POA: Insufficient documentation

## 2014-08-27 DIAGNOSIS — R918 Other nonspecific abnormal finding of lung field: Secondary | ICD-10-CM | POA: Diagnosis not present

## 2014-08-27 DIAGNOSIS — R0781 Pleurodynia: Secondary | ICD-10-CM | POA: Diagnosis not present

## 2014-08-27 DIAGNOSIS — J9811 Atelectasis: Secondary | ICD-10-CM | POA: Diagnosis not present

## 2014-08-27 LAB — HEPATIC FUNCTION PANEL
ALBUMIN: 3.7 g/dL (ref 3.5–5.2)
ALT: 14 U/L (ref 0–53)
AST: 19 U/L (ref 0–37)
Alkaline Phosphatase: 74 U/L (ref 39–117)
BILIRUBIN DIRECT: 0.4 mg/dL (ref 0.0–0.5)
BILIRUBIN TOTAL: 1.8 mg/dL — AB (ref 0.3–1.2)
Indirect Bilirubin: 1.4 mg/dL — ABNORMAL HIGH (ref 0.3–0.9)
Total Protein: 7.3 g/dL (ref 6.0–8.3)

## 2014-08-27 LAB — BASIC METABOLIC PANEL
Anion gap: 9 (ref 5–15)
BUN: 21 mg/dL (ref 6–23)
CALCIUM: 9 mg/dL (ref 8.4–10.5)
CHLORIDE: 101 mmol/L (ref 96–112)
CO2: 25 mmol/L (ref 19–32)
Creatinine, Ser: 1.23 mg/dL (ref 0.50–1.35)
GFR calc Af Amer: 65 mL/min — ABNORMAL LOW (ref >90)
GFR calc non Af Amer: 56 mL/min — ABNORMAL LOW (ref >90)
Glucose, Bld: 140 mg/dL — ABNORMAL HIGH (ref 70–99)
Potassium: 3.8 mmol/L (ref 3.5–5.1)
Sodium: 135 mmol/L (ref 135–145)

## 2014-08-27 LAB — LIPASE, BLOOD: Lipase: 22 U/L (ref 11–59)

## 2014-08-27 LAB — CBC
HCT: 43 % (ref 39.0–52.0)
HEMOGLOBIN: 14.4 g/dL (ref 13.0–17.0)
MCH: 29.5 pg (ref 26.0–34.0)
MCHC: 33.5 g/dL (ref 30.0–36.0)
MCV: 88.1 fL (ref 78.0–100.0)
PLATELETS: 262 10*3/uL (ref 150–400)
RBC: 4.88 MIL/uL (ref 4.22–5.81)
RDW: 13.7 % (ref 11.5–15.5)
WBC: 11.8 10*3/uL — ABNORMAL HIGH (ref 4.0–10.5)

## 2014-08-27 LAB — D-DIMER, QUANTITATIVE (NOT AT ARMC): D DIMER QUANT: 1.36 ug{FEU}/mL — AB (ref 0.00–0.48)

## 2014-08-27 LAB — I-STAT TROPONIN, ED: TROPONIN I, POC: 0 ng/mL (ref 0.00–0.08)

## 2014-08-27 MED ORDER — PREDNISONE 20 MG PO TABS: 40.0000 mg | ORAL_TABLET | Freq: Every day | ORAL | Status: DC

## 2014-08-27 MED ORDER — IOHEXOL 350 MG/ML SOLN
80.0000 mL | Freq: Once | INTRAVENOUS | Status: AC | PRN
  Administered 2014-08-27: 80 mL via INTRAVENOUS

## 2014-08-27 MED ORDER — NAPROXEN 500 MG PO TABS: 500.0000 mg | ORAL_TABLET | Freq: Two times a day (BID) | ORAL | Status: DC

## 2014-08-27 NOTE — ED Provider Notes (Addendum)
CSN: [id number]     Arrival date & time 08/27/14  1248 History   First MD Initiated Contact with Patient 08/27/14 1303     Chief Complaint  Patient presents with  . Chest Pain     (Consider location/radiation/quality/duration/timing/severity/associated sxs/prior Treatment) HPI Comments: Patient states that last Saturday after mowing the yard and having a full day of work he woke up Sunday morning with neck tenderness that radiates down into his shoulders. Patient saw his primary physician this week and had x-rays done and was told he had arthritis. He was placed on a muscle relaxer which does not seem to improve the pain all that much but then today woke up with pain in his right lower chest versus right upper abdomen.  He denies any injury to the area and is only worse with palpation and certain movements. Coughing and deep breathing also causes the pain.  Has recently had a cough with nonproductive sputum. No fever. Denies nausea, vomiting, shortness of breath.  Patient is a 74 y.o. male presenting with chest pain. The history is provided by the patient.  Chest Pain Pain location:  R chest Pain quality: sharp and shooting   Pain radiates to:  Does not radiate Pain radiates to the back: no   Pain severity:  Moderate Onset quality:  Sudden Duration:  1 day Timing:  Constant Progression:  Unchanged Chronicity:  New Context: movement   Context: not eating and no trauma   Relieved by:  Nothing Worsened by:  Deep breathing, certain positions and movement Associated symptoms: abdominal pain and cough   Associated symptoms: no altered mental status, no anorexia, no fever, no nausea, no palpitations and no shortness of breath   Risk factors: coronary artery disease and hypertension   Risk factors: no diabetes mellitus     Past Medical History  Diagnosis Date  . Ruptured lumbar disc   . Arthritis   . High triglycerides   . Cardiomyopathy, ischemic, EF by cath 35% and Echo 35-40% with  STEMI and PCI 12/21/2012  . CHF (congestive heart failure)     Acute combined systolic and diastolic heart failure  . ST elevation myocardial infarction (STEMI) of inferior wall 12/18/12    STEMI of inf. wall w/PCI with Xperdition stent to VG to distal RCA  . Abnormal echocardiogram 05/31/12    EF = 45-50% with more global hypokinesis. There was eccentric hypertrophy of the ventricle. There was a trace AI and mild MR.  . Obesity     unspecified  . Acute renal insufficiency   . Coronary artery disease     CABG in 1999 which was a 5-vessel bypass and had a questionable history of A fib. He underwent monitoring whick showed sinus bradycardia and PACs but no evidence of A fib.  Marland Kitchen Hypertension   . GERD (gastroesophageal reflux disease)    Past Surgical History  Procedure Laterality Date  . Lumbar disc surgery    . Tonsillectomy    . Back surgery      (5) back surgeries  . Coronary artery bypass graft  1999    (CAD) CABG was a 5-vessel bypass and had a questionable history of A fib. He underwent monitoring whick showed sinus bradycardia and PACs but no evidence of A fib.  Marland Kitchen Open heart surgery  03/1998  . Left heart catheterization with coronary/graft angiogram  12/18/2012    Procedure: LEFT HEART CATHETERIZATION WITH Isabel Caprice;  Surgeon: [name], MD;  Location: [facility] CATH LAB;  Service: Cardiovascular;;   Family History  Problem Relation Age of Onset  . CAD Mother   . Heart disease Mother   . CAD Brother   . Heart attack Brother   . Hyperlipidemia Sister   . Leukemia Father   . CAD Maternal Grandmother   . Brain cancer Brother   . CAD Sister    History  Substance Use Topics  . Smoking status: Former Smoker -- 4.50 packs/day for 30 years    Quit date: 05/07/1987  . Smokeless tobacco: Former NeurosurgeonUser    Types: Chew  . Alcohol Use: No    Review of Systems  Constitutional: Negative for fever.  Respiratory: Positive for cough. Negative for shortness of breath.     Cardiovascular: Positive for chest pain. Negative for palpitations.  Gastrointestinal: Positive for abdominal pain. Negative for nausea and anorexia.  All other systems reviewed and are negative.     Allergies  Aspirin and Other  Home Medications   Prior to Admission medications   Medication Sig Start Date End Date Taking? Authorizing Provider  acetaminophen (TYLENOL) 325 MG tablet Take 2 tablets (650 mg total) by mouth every 4 (four) hours as needed. 12/22/12   Abelino DerrickLuke K Kilroy, PA-C  aspirin EC 81 MG EC tablet Take 1 tablet (81 mg total) by mouth daily. 12/22/12   Abelino DerrickLuke K Kilroy, PA-C  atorvastatin (LIPITOR) 80 MG tablet Take 40 mg by mouth daily at 6 PM. 12/22/12   Abelino DerrickLuke K Kilroy, PA-C  furosemide (LASIX) 20 MG tablet TAKE 1 TABLET (20 MG TOTAL) BY MOUTH DAILY. 07/25/14   Chrystie NoseKenneth C Hilty, MD  isosorbide mononitrate (IMDUR) 30 MG 24 hr tablet TAKE 1 TABLET (30 MG TOTAL) BY MOUTH DAILY. 07/25/14   Chrystie NoseKenneth C Hilty, MD  lisinopril (PRINIVIL,ZESTRIL) 5 MG tablet TAKE 1 TABLET (5 MG TOTAL) BY MOUTH DAILY. 08/22/14   Mihai Croitoru, MD  LORazepam (ATIVAN) 0.5 MG tablet Take 0.5 mg by mouth every 8 (eight) hours as needed for anxiety. 01/07/13   Chrystie NoseKenneth C Hilty, MD  metoprolol tartrate (LOPRESSOR) 25 MG tablet Take 0.5 tablets (12.5 mg total) by mouth 2 (two) times daily. 07/06/13   Chrystie NoseKenneth C Hilty, MD  nitroGLYCERIN (NITROSTAT) 0.4 MG SL tablet Place 1 tablet (0.4 mg total) under the tongue every 5 (five) minutes as needed for chest pain. 12/22/12   Abelino DerrickLuke K Kilroy, PA-C  ranitidine (ZANTAC) 150 MG tablet Take 150 mg by mouth 2 (two) times daily.    Historical Provider, MD  ticagrelor (BRILINTA) 90 MG TABS tablet Take 1 tablet (90 mg total) by mouth 2 (two) times daily. 03/27/14   Chrystie NoseKenneth C Hilty, MD   BP 123/70 mmHg  Pulse 97  Temp(Src) 98.1 F (36.7 C) (Oral)  Resp 20  Ht 6\' 2"  (1.88 m)  Wt 284 lb (128.822 kg)  BMI 36.45 kg/m2  SpO2 97% Physical Exam  Constitutional: He is oriented to person, place,  and time. He appears well-developed and well-nourished. No distress.  HENT:  Head: Normocephalic and atraumatic.  Mouth/Throat: Oropharynx is clear and moist.  Eyes: Conjunctivae and EOM are normal. Pupils are equal, round, and reactive to light.  Neck: Normal range of motion. Neck supple. Muscular tenderness present. No spinous process tenderness present.    Cardiovascular: Normal rate, regular rhythm and intact distal pulses.   No murmur heard. Pulmonary/Chest: Effort normal and breath sounds normal. No respiratory distress. He has no wheezes. He has no rales.  Well healed sternotomy scar  Abdominal: Soft. He  exhibits no distension. There is tenderness in the right upper quadrant. There is positive Murphy's sign. There is no rebound and no guarding.    Musculoskeletal: Normal range of motion. He exhibits no edema or tenderness.  Neurological: He is alert and oriented to person, place, and time.  Skin: Skin is warm and dry. No rash noted. No erythema.  Psychiatric: He has a normal mood and affect. His behavior is normal.  Nursing note and vitals reviewed.   ED Course  Procedures (including critical care time) Labs Review Labs Reviewed  CBC - Abnormal; Notable for the following:    WBC 11.8 (*)    All other components within normal limits  BASIC METABOLIC PANEL - Abnormal; Notable for the following:    Glucose, Bld 140 (*)    GFR calc non Af Amer 56 (*)    GFR calc Af Amer 65 (*)    All other components within normal limits  HEPATIC FUNCTION PANEL - Abnormal; Notable for the following:    Total Bilirubin 1.8 (*)    Indirect Bilirubin 1.4 (*)    All other components within normal limits  D-DIMER, QUANTITATIVE - Abnormal; Notable for the following:    D-Dimer, Quant 1.36 (*)    All other components within normal limits  LIPASE, BLOOD  I-STAT TROPOININ, ED    Imaging Review Dg Chest 2 View  08/27/2014   CLINICAL DATA:  Bilateral shoulder pain for 1 week.  Right rib pain.   EXAM: CHEST  2 VIEW  Comparison:  05/15/2013  FINDINGS: Stable moderate enlargement of the cardiopericardial silhouette. Prior CABG.  Increased bibasilar bandlike opacities along the hemidiaphragms. Borderline airway thickening. No edema. No blunting of the costophrenic angles.  IMPRESSION: 1. Bandlike opacities along both hemidiaphragms favoring atelectasis. Aspiration pneumonitis from an upright position is a possible differential diagnostic consideration, correlate with risk factors. 2. Airway thickening is present, suggesting bronchitis or reactive airways disease. 3. Stable moderate enlargement of the cardiopericardial silhouette.   Electronically Signed   By: Gaylyn Rong M.D.   On: 08/27/2014 14:25   US Abdomen Complete  08/27/2014   CLINICAL DATA:  Right upper quadrant abdominal pain starting this morning.  EXAM: ULTRASOUND ABDOMEN COMPLETE  COMPARISON:  None.  FINDINGS: Gallbladder: No gallstones or wall thickening visualized. No sonographic Murphy sign noted.  Common bile duct: Diameter: 3 mm  Liver: No focal lesion identified. Within normal limits in parenchymal echogenicity.  IVC: No abnormality visualized.  Pancreas: Poorly seen due to overlying bowel gas.  Spleen: Upper normal splenic size.  Right Kidney: Length: 11.2 cm. Echogenicity within normal limits. No mass or hydronephrosis visualized.  Left Kidney: Length: 11.7 cm. Echogenicity within normal limits. No mass or hydronephrosis visualized.  Abdominal aorta: Poorly seen due to overlying bowel gas.  Other findings: None.  IMPRESSION: 1. No significant abnormality is identified account for the patient's right upper quadrant abdominal pain. Please note that there is poor visualization of the pancreas and abdominal aorta ascribed to overlying bowel gas.   Electronically Signed   By: Gaylyn Rong M.D.   On: 08/27/2014 15:50     EKG Interpretation   Date/Time:  Sunday August 27 2014 12:57:08 EDT Ventricular Rate:  94 PR Interval:   174 QRS Duration: 108 QT Interval:  364 QTC Calculation: 455 R Axis:   -13 Text Interpretation:  Normal sinus rhythm Inferior infarct , possibly  acute Consider right ventricular involvement in acute inferior infarct  Abnormal ECG Confirmed by Anitra Lauth  MD,  Alandria Butkiewicz ([id number]) on 08/27/2014  1:07:37 PM      MDM   Final diagnoses:  RUQ pain  Chest pain  Radiculopathy, unspecified spinal region    Patient with a significant history of coronary artery disease status post stenting and CABG 2 presents today with a new abrupt onset of right upper quadrant/right chest pain. He woke up with this pain this morning. The pain is worse with certain movements, palpation and deep breathing. Of note patient states that in the last week he has had significant shoulder and neck pain that started after mowing the yard last Saturday. He has seen his physician and had imaging done showed arthritis but has not followed up. He has been taking Flexeril without improvement. Patient denies any left-sided chest pain or substernal pain, shortness of breath, diaphoresis or nausea and vomiting. Patient has had a mild cough but contributed to allergies. Patient ate this morning without worsening of his pain. Patient denies any prior abdominal surgeries.  On exam patient has significant right upper quadrant pain with a positive Murphy sign.  Concern for PE versus cystitis versus pneumonia.  CBC, CMP, troponin, chest x-ray, d-dimer pending. At this time patient does not want pain control. Initial EKG had a wavy baseline and was concerned for possible STEMI however repeat EKG without concerning findings  4:33 PM Labs significant for an elevated d-dimer 1.36. Chest x-ray within normal limits and right upper quadrant ultrasound without acute findings. Will do a CTA to rule out PE. If CT is negative most likely musculoskeletal in nature. Will send patient home with anti-inflammatory and steroids for his neck pain which is most  likely cervical radiculopathy or muscular  Gwyneth Sprout, MD 08/27/14 1634  Gwyneth Sprout, MD 08/27/14 1652

## 2014-08-27 NOTE — ED Notes (Addendum)
Pt reports bilateral shoulder pain x 1 week. States he was seen by PCP for same and told was arthritis. Pt states he started to develop lower right sided chest pain last night. Pain worse with inspiration and movement. Denies SOB, diaphoresis, N/V. Denies pain while sitting, reports pain increases to 10/10 with movement. Pt in NAD.

## 2014-08-27 NOTE — ED Notes (Signed)
Pt placed on monitor upon return to room. Pt monitored by blood pressure and pulse ox.  

## 2014-08-27 NOTE — ED Notes (Signed)
The pt returned from c-t.  Wife at the bedside

## 2014-09-27 ENCOUNTER — Other Ambulatory Visit: Payer: Self-pay | Admitting: Internal Medicine

## 2014-09-27 NOTE — Telephone Encounter (Signed)
Lisinopril refilled 08/22/14

## 2014-11-16 DIAGNOSIS — E559 Vitamin D deficiency, unspecified: Secondary | ICD-10-CM | POA: Diagnosis not present

## 2014-11-16 DIAGNOSIS — E785 Hyperlipidemia, unspecified: Secondary | ICD-10-CM | POA: Diagnosis not present

## 2014-11-16 DIAGNOSIS — I10 Essential (primary) hypertension: Secondary | ICD-10-CM | POA: Diagnosis not present

## 2014-11-16 DIAGNOSIS — E1121 Type 2 diabetes mellitus with diabetic nephropathy: Secondary | ICD-10-CM | POA: Diagnosis not present

## 2014-11-23 DIAGNOSIS — I2510 Atherosclerotic heart disease of native coronary artery without angina pectoris: Secondary | ICD-10-CM | POA: Diagnosis not present

## 2014-11-23 DIAGNOSIS — E1121 Type 2 diabetes mellitus with diabetic nephropathy: Secondary | ICD-10-CM | POA: Diagnosis not present

## 2014-11-23 DIAGNOSIS — N182 Chronic kidney disease, stage 2 (mild): Secondary | ICD-10-CM | POA: Diagnosis not present

## 2014-11-23 DIAGNOSIS — Z23 Encounter for immunization: Secondary | ICD-10-CM | POA: Diagnosis not present

## 2014-11-23 DIAGNOSIS — I130 Hypertensive heart and chronic kidney disease with heart failure and stage 1 through stage 4 chronic kidney disease, or unspecified chronic kidney disease: Secondary | ICD-10-CM | POA: Diagnosis not present

## 2015-04-10 DIAGNOSIS — J069 Acute upper respiratory infection, unspecified: Secondary | ICD-10-CM | POA: Diagnosis not present

## 2015-04-18 ENCOUNTER — Other Ambulatory Visit: Payer: Self-pay | Admitting: Internal Medicine

## 2015-04-18 ENCOUNTER — Telehealth: Payer: Self-pay | Admitting: *Deleted

## 2015-04-18 ENCOUNTER — Ambulatory Visit (INDEPENDENT_AMBULATORY_CARE_PROVIDER_SITE_OTHER): Payer: Medicare Other | Admitting: Internal Medicine

## 2015-04-18 ENCOUNTER — Encounter: Payer: Self-pay | Admitting: Internal Medicine

## 2015-04-18 VITALS — BP 110/72 | HR 64 | Ht 71.0 in | Wt 285.0 lb

## 2015-04-18 DIAGNOSIS — I255 Ischemic cardiomyopathy: Secondary | ICD-10-CM | POA: Diagnosis not present

## 2015-04-18 DIAGNOSIS — I25810 Atherosclerosis of coronary artery bypass graft(s) without angina pectoris: Secondary | ICD-10-CM | POA: Diagnosis not present

## 2015-04-18 DIAGNOSIS — E785 Hyperlipidemia, unspecified: Secondary | ICD-10-CM

## 2015-04-18 MED ORDER — ATORVASTATIN CALCIUM 80 MG PO TABS: 80.0000 mg | ORAL_TABLET | Freq: Every day | ORAL | Status: DC

## 2015-04-18 NOTE — Telephone Encounter (Signed)
Faxed info for patient assistance for patient - list of allergies, medications, health conditions.

## 2015-04-18 NOTE — Telephone Encounter (Signed)
Faxed notification that patient no longer needs med assistance - brilinta was discontinued by MD on 04/18/15

## 2015-04-18 NOTE — Patient Instructions (Addendum)
Your physician wants you to follow-up in: 6 months with Dr. Rennis GoldenHilty. You will receive a reminder letter in the mail two months in advance. If you don't receive a letter, please call our office to schedule the follow-up appointment.  Your physician has recommended you make the following change in your medication: STOP Brilinta

## 2015-04-18 NOTE — Progress Notes (Signed)
OFFICE NOTE  Chief Complaint:  No complaints  Primary Care Physician: Thayer HeadingsMACKENZIE,BRIAN, MD  HPI:  Kerby MoorsVernon Balthazar is 74 y/o with a history of CAD, s/p CABG X 4 in 1999 with an LIMA-LAD, SVG-OM, SVG-Dx, SVG-RCA. [facility]18[facility]12 with a STEMI and was taken to the cath lab by Dr Tresa EndoKelly. This revealed the LIMA to the LAD to be patent with collaterals to the Dx. The SVG- OM: was occluded and the SVG- DX: was occluded The culprit appeared to be occlusion of SVG to distal RCA with no flow proximal due to anastomis occlusion with thrombus. He underwent a very difficult PCI with PTCA/ thrombectomy. He had no flow phenomenon requiring IC/IV NTG/IC verapamil/ angiomax, brilinta 180 mg, integrelin. He ultimately received a Xience Xpedition stent to distal anastomosis. He did have acute, transient CHF but stabilized with diuresis. He did well and was transferred to telemetry on 12/21/12.. Troponin was greater than 20. EF at cath was 35%. EF by echo was 35=40% with severe hypokinesis of the basal inferolateral myocardium; moderate hypokinesis of the basal-mid inferior and mid inferolateral myocardium; and mild hypokinesis of the basal-mid inferoseptal and apical septal myocardium. After he was transferred to telemetry he awaked early in the morning of the 30th with neck pain. He was transferred back to the ICU by the cardiology fellow on call. The pt was seen and examined by Dr Rennis GoldenHilty the morning of the 29th. Dr Rennis GoldenHilty did not feel his symptoms were cardiac. We added a NSAID and Skelaxin. He took this for a day or 2 he reported his neck pain improved significantly.  Today is here for followup and appears to be doing fairly well. He does report some shortness of breath doing moderate to more intense activities which he is trying to avoid. He did some lawn mowing which made her short of breath after a while. 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 He does feel that his energy level  has improved significantly and hopefully this means that his EF has come up as well. He continues to take naproxen on a daily basis, however it was intended that he only take this for a short period time for his neck pain. He did however discontinue Skelaxin. I reviewed recent laboratory work from his primary care provider today including a cholesterol profile. I was surprised to see how low that was, noting that his total cholesterol was 80, triglycerides 107 HDL 35 and LDL of 24. This is on atorvastatin 80 mg daily.  I saw Mr. Neil CrouchCrane back in the office today. He continues to do very well. He is asymptomatic this had no significant weight gain or worsening shortness of breath. He denies any extremity edema. Blood pressure is very well-controlled today 122/60. He recently had laboratory work through his primary care provider which shows excellent cholesterol control. He does have a history of cardiomyopathy and is due for repeat echocardiogram.   Mr. Neil CrouchCrane returns today for follow-up. Overall he is doing exceedingly well. He denies any worsening chest pain or shortness of breath. He's done well on his current medicines. Cholesterol is been well controlled. 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 Recent echocardiogram earlier this year shows an improvement in EF up to 45%.  PMHx:  Past Medical History  Diagnosis Date  . Ruptured lumbar disc   . Arthritis   . High triglycerides   . Cardiomyopathy, ischemic, EF by cath 35% and  Echo 35-40% with STEMI and PCI 12/21/2012  . CHF (congestive heart failure) (HCC)     Acute combined systolic and diastolic heart failure  . ST elevation myocardial infarction (STEMI) of inferior wall (HCC) 12/18/12    STEMI of inf. wall w/PCI with Xperdition stent to VG to distal RCA  . Abnormal echocardiogram 05/31/12    EF = 45-50% with more global hypokinesis. There was eccentric hypertrophy of the ventricle. There was a trace AI and mild MR.  .  Obesity     unspecified  . Acute renal insufficiency   . Coronary artery disease     CABG in 1999 which was a 5-vessel bypass and had a questionable history of A fib. He underwent monitoring whick showed sinus bradycardia and PACs but no evidence of A fib.  Marland Kitchen Hypertension   . GERD (gastroesophageal reflux disease)     Past Surgical History  Procedure Laterality Date  . Lumbar disc surgery    . Tonsillectomy    . Back surgery      (5) back surgeries  . Coronary artery bypass graft  1999    (CAD) CABG was a 5-vessel bypass and had a questionable history of A fib. He underwent monitoring whick showed sinus bradycardia and PACs but no evidence of A fib.  Marland Kitchen Open heart surgery  03/1998  . Left heart catheterization with coronary/graft angiogram  12/18/2012    Procedure: LEFT HEART CATHETERIZATION WITH Isabel Caprice;  Surgeon: [name], MD;  Location: [facility] CATH LAB;  Service: Cardiovascular;;    FAMHx:  Family History  Problem Relation Age of Onset  . CAD Mother   . Heart disease Mother   . CAD Brother   . Heart attack Brother   . Hyperlipidemia Sister   . Leukemia Father   . CAD Maternal Grandmother   . Brain cancer Brother   . CAD Sister     SOCHx:   reports that he quit smoking about 27 years ago. He has quit using smokeless tobacco. His smokeless tobacco use included Chew. He reports that he does not drink alcohol or use illicit drugs.  ALLERGIES:  Allergies  Allergen Reactions  . Aspirin Nausea And Vomiting    Can't take full strength uncoated aspirin  . Other Other (See Comments)    Pain medication that he was given after open heart surgery-sweating and hallucinations     ROS: A comprehensive review of systems was negative.  HOME MEDS: Current Outpatient Prescriptions  Medication Sig Dispense Refill  . acetaminophen (TYLENOL) 325 MG tablet Take 2 tablets (650 mg total) by mouth every 4 (four) hours as needed. (Patient taking differently: Take 650 mg  by mouth every 4 (four) hours as needed for mild pain. )    . aspirin EC 81 MG EC tablet Take 1 tablet (81 mg total) by mouth daily.    Marland Kitchen atorvastatin (LIPITOR) 80 MG tablet Take 1 tablet (80 mg total) by mouth daily at 6 PM. 30 tablet 5  . furosemide (LASIX) 20 MG tablet TAKE 1 TABLET (20 MG TOTAL) BY MOUTH DAILY. 30 tablet 11  . isosorbide mononitrate (IMDUR) 30 MG 24 hr tablet TAKE 1 TABLET (30 MG TOTAL) BY MOUTH DAILY. 30 tablet 11  . lisinopril (PRINIVIL,ZESTRIL) 5 MG tablet TAKE 1 TABLET (5 MG TOTAL) BY MOUTH DAILY. (Patient taking differently: Take 5 mg every evening at 6 PM.) 30 tablet 11  . LORazepam (ATIVAN) 0.5 MG tablet Take 0.5 mg by mouth every 8 (eight)  hours as needed for anxiety.    . metoprolol tartrate (LOPRESSOR) 25 MG tablet Take 0.5 tablets (12.5 mg total) by mouth 2 (two) times daily. 30 tablet 11  . nitroGLYCERIN (NITROSTAT) 0.4 MG SL tablet Place 1 tablet (0.4 mg total) under the tongue every 5 (five) minutes as needed for chest pain. 25 tablet 3  . ranitidine (ZANTAC) 150 MG tablet Take 150 mg by mouth 2 (two) times daily.    Marland Kitchen atorvastatin (LIPITOR) 80 MG tablet TAKE 1 TABLET DAILY AT 6 PM. 30 tablet 11   No current facility-administered medications for this visit.    LABS/IMAGING: No results found for this or any previous visit (from the past 48 hour(s)). No results found.  VITALS: BP 110/72 mmHg  Pulse 64  Ht  (1.803 m)  Wt 285 lb (129.275 kg)  BMI 39.77 kg/m2  EXAM: General appearance: alert and no distress Neck: no adenopathy, no carotid bruit, no JVD, supple, symmetrical, trachea midline and thyroid not enlarged, symmetric, no tenderness/mass/nodules Lungs: clear to auscultation bilaterally Heart: regular rate and rhythm, S1, S2 normal, no murmur, click, rub or gallop Abdomen: soft, non-tender; bowel sounds normal; no masses,  no organomegaly Extremities: extremities normal, atraumatic, no cyanosis or edema Pulses: 2+ and symmetric Skin: Skin  color, texture, turgor normal. No rashes or lesions Neurologic: Grossly normal  EKG:  sinus rhythm with first-degree AV block and occasional PVCs, nonspecific T wave changes at 64  ASSESSMENT: 1. Coronary artery disease status post CABG with ST elevation MI and stent placement to the vein graft to PDA, with occluded grafts to the OM and diagonal vessels and a patent LIMA to LAD 2. 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 Hypertension 4. Dyslipidemia  PLAN: 1.   Mr. Neil Crouch continues to do well after STEMI and stent placement. It's been 2 years since his stent placement and he has remained on Brilinta due to some long-term trial data. At this point I think however I'm more concerned about long-term bleeding risk on dual antiplatelet therapy versus benefit. I recommend discontinuing Brilinta but he should remain on 81 mg enteric-coated aspirin. He's also requesting a refill of Lipitor today. Blood pressure is well controlled and his additional medications are stable. We'll plan to see him back in 6 months for follow-up.  Chrystie Nose, MD, [facility] Attending Cardiologist [facility]  Chrystie Nose 04/18/2015, 4:58 PM

## 2015-05-17 DIAGNOSIS — Z23 Encounter for immunization: Secondary | ICD-10-CM | POA: Diagnosis not present

## 2015-05-17 DIAGNOSIS — I5032 Chronic diastolic (congestive) heart failure: Secondary | ICD-10-CM | POA: Diagnosis not present

## 2015-05-17 DIAGNOSIS — Z0000 Encounter for general adult medical examination without abnormal findings: Secondary | ICD-10-CM | POA: Diagnosis not present

## 2015-05-17 DIAGNOSIS — E1121 Type 2 diabetes mellitus with diabetic nephropathy: Secondary | ICD-10-CM | POA: Diagnosis not present

## 2015-05-17 DIAGNOSIS — I2510 Atherosclerotic heart disease of native coronary artery without angina pectoris: Secondary | ICD-10-CM | POA: Diagnosis not present

## 2015-05-17 DIAGNOSIS — Z1389 Encounter for screening for other disorder: Secondary | ICD-10-CM | POA: Diagnosis not present

## 2015-05-17 DIAGNOSIS — N182 Chronic kidney disease, stage 2 (mild): Secondary | ICD-10-CM | POA: Diagnosis not present

## 2015-05-17 DIAGNOSIS — I130 Hypertensive heart and chronic kidney disease with heart failure and stage 1 through stage 4 chronic kidney disease, or unspecified chronic kidney disease: Secondary | ICD-10-CM | POA: Diagnosis not present

## 2015-05-17 DIAGNOSIS — Z125 Encounter for screening for malignant neoplasm of prostate: Secondary | ICD-10-CM | POA: Diagnosis not present

## 2015-05-25 DIAGNOSIS — F17211 Nicotine dependence, cigarettes, in remission: Secondary | ICD-10-CM | POA: Diagnosis not present

## 2015-05-25 DIAGNOSIS — E1122 Type 2 diabetes mellitus with diabetic chronic kidney disease: Secondary | ICD-10-CM | POA: Diagnosis not present

## 2015-05-25 DIAGNOSIS — I129 Hypertensive chronic kidney disease with stage 1 through stage 4 chronic kidney disease, or unspecified chronic kidney disease: Secondary | ICD-10-CM | POA: Diagnosis not present

## 2015-05-25 DIAGNOSIS — I2510 Atherosclerotic heart disease of native coronary artery without angina pectoris: Secondary | ICD-10-CM | POA: Diagnosis not present

## 2015-05-25 DIAGNOSIS — I5032 Chronic diastolic (congestive) heart failure: Secondary | ICD-10-CM | POA: Diagnosis not present

## 2015-06-28 DIAGNOSIS — Z1211 Encounter for screening for malignant neoplasm of colon: Secondary | ICD-10-CM | POA: Diagnosis not present

## 2015-06-28 DIAGNOSIS — Z1212 Encounter for screening for malignant neoplasm of rectum: Secondary | ICD-10-CM | POA: Diagnosis not present

## 2015-07-10 DIAGNOSIS — J019 Acute sinusitis, unspecified: Secondary | ICD-10-CM | POA: Diagnosis not present

## 2015-07-10 DIAGNOSIS — J209 Acute bronchitis, unspecified: Secondary | ICD-10-CM | POA: Diagnosis not present

## 2015-07-11 DIAGNOSIS — J209 Acute bronchitis, unspecified: Secondary | ICD-10-CM | POA: Diagnosis not present

## 2015-07-15 ENCOUNTER — Other Ambulatory Visit: Payer: Self-pay | Admitting: Internal Medicine

## 2015-07-16 NOTE — Telephone Encounter (Signed)
Rx request sent to pharmacy.  

## 2015-08-13 ENCOUNTER — Other Ambulatory Visit: Payer: Self-pay | Admitting: Cardiovascular Disease

## 2015-08-13 NOTE — Telephone Encounter (Signed)
Rx refill sent to pharmacy. 

## 2015-10-06 ENCOUNTER — Other Ambulatory Visit: Payer: Self-pay | Admitting: Cardiovascular Disease

## 2015-10-08 NOTE — Telephone Encounter (Signed)
Rx request sent to pharmacy.  

## 2015-11-03 ENCOUNTER — Other Ambulatory Visit: Payer: Self-pay | Admitting: Internal Medicine

## 2015-11-05 NOTE — Telephone Encounter (Signed)
Rx request sent to pharmacy.  

## 2015-11-16 DIAGNOSIS — E1122 Type 2 diabetes mellitus with diabetic chronic kidney disease: Secondary | ICD-10-CM | POA: Diagnosis not present

## 2015-11-16 DIAGNOSIS — E559 Vitamin D deficiency, unspecified: Secondary | ICD-10-CM | POA: Diagnosis not present

## 2015-11-16 DIAGNOSIS — E785 Hyperlipidemia, unspecified: Secondary | ICD-10-CM | POA: Diagnosis not present

## 2015-11-16 DIAGNOSIS — I129 Hypertensive chronic kidney disease with stage 1 through stage 4 chronic kidney disease, or unspecified chronic kidney disease: Secondary | ICD-10-CM | POA: Diagnosis not present

## 2015-11-23 DIAGNOSIS — E1122 Type 2 diabetes mellitus with diabetic chronic kidney disease: Secondary | ICD-10-CM | POA: Diagnosis not present

## 2015-11-23 DIAGNOSIS — Z23 Encounter for immunization: Secondary | ICD-10-CM | POA: Diagnosis not present

## 2015-11-23 DIAGNOSIS — I5032 Chronic diastolic (congestive) heart failure: Secondary | ICD-10-CM | POA: Diagnosis not present

## 2015-11-23 DIAGNOSIS — E785 Hyperlipidemia, unspecified: Secondary | ICD-10-CM | POA: Diagnosis not present

## 2015-11-23 DIAGNOSIS — F17211 Nicotine dependence, cigarettes, in remission: Secondary | ICD-10-CM | POA: Diagnosis not present

## 2015-11-23 DIAGNOSIS — J449 Chronic obstructive pulmonary disease, unspecified: Secondary | ICD-10-CM | POA: Diagnosis not present

## 2015-12-06 ENCOUNTER — Other Ambulatory Visit: Payer: Self-pay

## 2015-12-07 ENCOUNTER — Other Ambulatory Visit: Payer: Self-pay | Admitting: *Deleted

## 2015-12-07 MED ORDER — ISOSORBIDE MONONITRATE ER 30 MG PO TB24: 30.0000 mg | ORAL_TABLET | Freq: Every day | ORAL | Status: DC

## 2015-12-07 MED ORDER — FUROSEMIDE 20 MG PO TABS: 20.0000 mg | ORAL_TABLET | Freq: Every day | ORAL | Status: DC

## 2015-12-07 NOTE — Telephone Encounter (Signed)
Rx has been sent to the pharmacy electronically. ° °

## 2015-12-27 ENCOUNTER — Encounter (HOSPITAL_COMMUNITY): Payer: Self-pay | Admitting: *Deleted

## 2015-12-27 ENCOUNTER — Inpatient Hospital Stay (HOSPITAL_COMMUNITY)
Admission: EM | Admit: 2015-12-27 | Discharge: 2015-12-29 | DRG: 269 | Disposition: A | Discharge status: Home / Self Care | Payer: Medicare Other | Attending: Family Medicine | Admitting: Family Medicine

## 2015-12-27 ENCOUNTER — Emergency Department (HOSPITAL_COMMUNITY): Payer: Medicare Other

## 2015-12-27 DIAGNOSIS — I10 Essential (primary) hypertension: Secondary | ICD-10-CM | POA: Diagnosis not present

## 2015-12-27 DIAGNOSIS — Z8679 Personal history of other diseases of the circulatory system: Secondary | ICD-10-CM

## 2015-12-27 DIAGNOSIS — E669 Obesity, unspecified: Secondary | ICD-10-CM | POA: Diagnosis present

## 2015-12-27 DIAGNOSIS — Z95828 Presence of other vascular implants and grafts: Secondary | ICD-10-CM

## 2015-12-27 DIAGNOSIS — R112 Nausea with vomiting, unspecified: Secondary | ICD-10-CM | POA: Diagnosis not present

## 2015-12-27 DIAGNOSIS — I714 Abdominal aortic aneurysm, without rupture, unspecified: Secondary | ICD-10-CM | POA: Diagnosis present

## 2015-12-27 DIAGNOSIS — R1084 Generalized abdominal pain: Secondary | ICD-10-CM | POA: Diagnosis not present

## 2015-12-27 DIAGNOSIS — K529 Noninfective gastroenteritis and colitis, unspecified: Secondary | ICD-10-CM

## 2015-12-27 DIAGNOSIS — K219 Gastro-esophageal reflux disease without esophagitis: Secondary | ICD-10-CM | POA: Diagnosis present

## 2015-12-27 DIAGNOSIS — I5042 Chronic combined systolic (congestive) and diastolic (congestive) heart failure: Secondary | ICD-10-CM

## 2015-12-27 DIAGNOSIS — I739 Peripheral vascular disease, unspecified: Secondary | ICD-10-CM | POA: Diagnosis present

## 2015-12-27 DIAGNOSIS — R197 Diarrhea, unspecified: Secondary | ICD-10-CM | POA: Diagnosis not present

## 2015-12-27 DIAGNOSIS — Z955 Presence of coronary angioplasty implant and graft: Secondary | ICD-10-CM

## 2015-12-27 DIAGNOSIS — I25810 Atherosclerosis of coronary artery bypass graft(s) without angina pectoris: Secondary | ICD-10-CM | POA: Diagnosis present

## 2015-12-27 DIAGNOSIS — K649 Unspecified hemorrhoids: Secondary | ICD-10-CM | POA: Diagnosis present

## 2015-12-27 DIAGNOSIS — A084 Viral intestinal infection, unspecified: Secondary | ICD-10-CM | POA: Diagnosis not present

## 2015-12-27 DIAGNOSIS — I110 Hypertensive heart disease with heart failure: Secondary | ICD-10-CM | POA: Diagnosis present

## 2015-12-27 DIAGNOSIS — E860 Dehydration: Secondary | ICD-10-CM | POA: Diagnosis not present

## 2015-12-27 DIAGNOSIS — Z951 Presence of aortocoronary bypass graft: Secondary | ICD-10-CM

## 2015-12-27 DIAGNOSIS — Z87891 Personal history of nicotine dependence: Secondary | ICD-10-CM

## 2015-12-27 DIAGNOSIS — Z7982 Long term (current) use of aspirin: Secondary | ICD-10-CM

## 2015-12-27 DIAGNOSIS — I255 Ischemic cardiomyopathy: Secondary | ICD-10-CM | POA: Diagnosis present

## 2015-12-27 DIAGNOSIS — E785 Hyperlipidemia, unspecified: Secondary | ICD-10-CM | POA: Diagnosis present

## 2015-12-27 DIAGNOSIS — M1990 Unspecified osteoarthritis, unspecified site: Secondary | ICD-10-CM | POA: Diagnosis present

## 2015-12-27 DIAGNOSIS — K4020 Bilateral inguinal hernia, without obstruction or gangrene, not specified as recurrent: Secondary | ICD-10-CM | POA: Diagnosis present

## 2015-12-27 DIAGNOSIS — K760 Fatty (change of) liver, not elsewhere classified: Secondary | ICD-10-CM | POA: Diagnosis present

## 2015-12-27 DIAGNOSIS — I2510 Atherosclerotic heart disease of native coronary artery without angina pectoris: Secondary | ICD-10-CM | POA: Diagnosis present

## 2015-12-27 DIAGNOSIS — K567 Ileus, unspecified: Secondary | ICD-10-CM | POA: Diagnosis not present

## 2015-12-27 DIAGNOSIS — Z8249 Family history of ischemic heart disease and other diseases of the circulatory system: Secondary | ICD-10-CM

## 2015-12-27 DIAGNOSIS — E7800 Pure hypercholesterolemia, unspecified: Secondary | ICD-10-CM | POA: Diagnosis present

## 2015-12-27 DIAGNOSIS — I252 Old myocardial infarction: Secondary | ICD-10-CM

## 2015-12-27 DIAGNOSIS — Z6837 Body mass index (BMI) 37.0-37.9, adult: Secondary | ICD-10-CM

## 2015-12-27 LAB — CBC
HEMATOCRIT: 45.9 % (ref 39.0–52.0)
HEMOGLOBIN: 14.7 g/dL (ref 13.0–17.0)
MCH: 28.5 pg (ref 26.0–34.0)
MCHC: 32 g/dL (ref 30.0–36.0)
MCV: 89 fL (ref 78.0–100.0)
Platelets: 207 10*3/uL (ref 150–400)
RBC: 5.16 MIL/uL (ref 4.22–5.81)
RDW: 14.4 % (ref 11.5–15.5)
WBC: 7.3 10*3/uL (ref 4.0–10.5)

## 2015-12-27 LAB — COMPREHENSIVE METABOLIC PANEL
ALBUMIN: 3.9 g/dL (ref 3.5–5.0)
ALK PHOS: 64 U/L (ref 38–126)
ALT: 18 U/L (ref 17–63)
ANION GAP: 11 (ref 5–15)
AST: 23 U/L (ref 15–41)
BUN: 17 mg/dL (ref 6–20)
CALCIUM: 9 mg/dL (ref 8.9–10.3)
CO2: 23 mmol/L (ref 22–32)
Chloride: 106 mmol/L (ref 101–111)
Creatinine, Ser: 1.12 mg/dL (ref 0.61–1.24)
GFR calc non Af Amer: >60 mL/min (ref >60)
Glucose, Bld: 152 mg/dL — ABNORMAL HIGH (ref 65–99)
POTASSIUM: 3.6 mmol/L (ref 3.5–5.1)
SODIUM: 140 mmol/L (ref 135–145)
TOTAL PROTEIN: 6.9 g/dL (ref 6.5–8.1)
Total Bilirubin: 2.1 mg/dL — ABNORMAL HIGH (ref 0.3–1.2)

## 2015-12-27 LAB — URINALYSIS, ROUTINE W REFLEX MICROSCOPIC
Bilirubin Urine: NEGATIVE
Glucose, UA: NEGATIVE mg/dL
Hgb urine dipstick: NEGATIVE
Ketones, ur: NEGATIVE mg/dL
Leukocytes, UA: NEGATIVE
NITRITE: NEGATIVE
PH: 5.5 (ref 5.0–8.0)
Protein, ur: 30 mg/dL — AB
SPECIFIC GRAVITY, URINE: 1.03 (ref 1.005–1.030)

## 2015-12-27 LAB — LIPASE, BLOOD: Lipase: 15 U/L (ref 11–51)

## 2015-12-27 LAB — I-STAT CG4 LACTIC ACID, ED
LACTIC ACID, VENOUS: 1.93 mmol/L — AB (ref 0.5–1.9)
LACTIC ACID, VENOUS: 1.53 mmol/L (ref 0.5–1.9)

## 2015-12-27 LAB — URINE MICROSCOPIC-ADD ON
RBC / HPF: NONE SEEN RBC/hpf (ref 0–5)
WBC UA: NONE SEEN WBC/hpf (ref 0–5)

## 2015-12-27 MED ORDER — IPRATROPIUM-ALBUTEROL 0.5-2.5 (3) MG/3ML IN SOLN: 3.0000 mL | RESPIRATORY_TRACT | Status: DC | PRN

## 2015-12-27 MED ORDER — LISINOPRIL 5 MG PO TABS
5.0000 mg | ORAL_TABLET | Freq: Every day | ORAL | Status: DC
  Administered 2015-12-29: 5 mg via ORAL
  Filled 2015-12-27: qty 1

## 2015-12-27 MED ORDER — ONDANSETRON 4 MG PO TBDP
4.0000 mg | ORAL_TABLET | Freq: Once | ORAL | Status: AC | PRN
  Administered 2015-12-27: 4 mg via ORAL

## 2015-12-27 MED ORDER — ONDANSETRON HCL 4 MG PO TABS: 4.0000 mg | ORAL_TABLET | Freq: Four times a day (QID) | ORAL | Status: DC | PRN

## 2015-12-27 MED ORDER — SODIUM CHLORIDE 0.9% FLUSH
3.0000 mL | Freq: Two times a day (BID) | INTRAVENOUS | Status: DC
  Administered 2015-12-28 – 2015-12-29 (×2): 3 mL via INTRAVENOUS

## 2015-12-27 MED ORDER — LORAZEPAM 0.5 MG PO TABS
0.5000 mg | ORAL_TABLET | Freq: Three times a day (TID) | ORAL | Status: DC | PRN
  Administered 2015-12-28: 0.5 mg via ORAL
  Filled 2015-12-27: qty 1

## 2015-12-27 MED ORDER — ATORVASTATIN CALCIUM 40 MG PO TABS: 40.0000 mg | ORAL_TABLET | Freq: Every day | ORAL | Status: DC

## 2015-12-27 MED ORDER — ACETAMINOPHEN 325 MG PO TABS: 650.0000 mg | ORAL_TABLET | ORAL | Status: DC | PRN

## 2015-12-27 MED ORDER — DEXTROSE 5 % IV SOLN
3.0000 g | INTRAVENOUS | Status: AC
  Administered 2015-12-28: 3 g via INTRAVENOUS
  Filled 2015-12-27: qty 3000

## 2015-12-27 MED ORDER — ASPIRIN EC 81 MG PO TBEC
81.0000 mg | DELAYED_RELEASE_TABLET | Freq: Every day | ORAL | Status: DC
  Administered 2015-12-29: 81 mg via ORAL
  Filled 2015-12-27: qty 1

## 2015-12-27 MED ORDER — MORPHINE SULFATE (PF) 4 MG/ML IV SOLN
4.0000 mg | Freq: Once | INTRAVENOUS | Status: AC
Start: 2015-12-27 — End: 2015-12-27
  Administered 2015-12-27: 4 mg via INTRAVENOUS
  Filled 2015-12-27: qty 1

## 2015-12-27 MED ORDER — ONDANSETRON HCL 4 MG/2ML IJ SOLN: 4.0000 mg | Freq: Four times a day (QID) | INTRAMUSCULAR | Status: DC | PRN

## 2015-12-27 MED ORDER — FAMOTIDINE 20 MG PO TABS
20.0000 mg | ORAL_TABLET | Freq: Two times a day (BID) | ORAL | Status: DC
  Administered 2015-12-27 – 2015-12-29 (×3): 20 mg via ORAL
  Filled 2015-12-27 (×3): qty 1

## 2015-12-27 MED ORDER — METOPROLOL TARTRATE 12.5 MG HALF TABLET
12.5000 mg | ORAL_TABLET | Freq: Two times a day (BID) | ORAL | Status: DC
  Administered 2015-12-27 – 2015-12-29 (×3): 12.5 mg via ORAL
  Filled 2015-12-27 (×3): qty 1

## 2015-12-27 MED ORDER — SODIUM CHLORIDE 0.9 % IV BOLUS (SEPSIS)
500.0000 mL | Freq: Once | INTRAVENOUS | Status: AC
  Administered 2015-12-27: 500 mL via INTRAVENOUS

## 2015-12-27 MED ORDER — ISOSORBIDE MONONITRATE ER 30 MG PO TB24
30.0000 mg | ORAL_TABLET | Freq: Every day | ORAL | Status: DC
  Administered 2015-12-29: 30 mg via ORAL
  Filled 2015-12-27: qty 1

## 2015-12-27 MED ORDER — FUROSEMIDE 20 MG PO TABS
20.0000 mg | ORAL_TABLET | Freq: Every day | ORAL | Status: DC
  Administered 2015-12-29: 20 mg via ORAL
  Filled 2015-12-27: qty 1

## 2015-12-27 MED ORDER — NITROGLYCERIN 0.4 MG SL SUBL: 0.4000 mg | SUBLINGUAL_TABLET | SUBLINGUAL | Status: DC | PRN

## 2015-12-27 MED ORDER — CEFAZOLIN SODIUM-DEXTROSE 2-4 GM/100ML-% IV SOLN: 2.0000 g | INTRAVENOUS | Status: DC

## 2015-12-27 MED ORDER — SODIUM CHLORIDE 0.9 % IV SOLN
INTRAVENOUS | Status: AC
  Administered 2015-12-27: 22:00:00 via INTRAVENOUS

## 2015-12-27 MED ORDER — IOPAMIDOL (ISOVUE-300) INJECTION 61%
INTRAVENOUS | Status: AC
  Administered 2015-12-27: 100 mL
  Filled 2015-12-27: qty 100

## 2015-12-27 MED ORDER — ONDANSETRON 4 MG PO TBDP
ORAL_TABLET | ORAL | Status: AC
  Filled 2015-12-27: qty 1

## 2015-12-27 NOTE — Consult Note (Signed)
Consult Note  Patient name: Ryan Wise MRN: [id number] DOB: 1940/10/09 Sex: male  Consulting Physician:  ER  Reason for Consult:  Chief Complaint  Patient presents with  . Abdominal Pain  . Emesis  . Diarrhea    HISTORY OF PRESENT ILLNESS: This is a 75 year old male who presented to the ER tonight with complaints of nausea & vomiting as well as diarrhea and abdominal pain.  He had a low grade fever.  His diarrhea was watery with bright red blood.  He has a history of hemorrhoids.  He did receive pain meds in the ER.  His abdominal pain has resolved and he has not had any further diarrhea or nausea.  A CT scan was done which revealed a 8.7 cm AAA.  He has a history of CAD, s/p CABG.  He is followed by Dr. Rennis Golden.  He is on a statin for hypercholesterolemia.  He takes an ASA.  He is medically managed for hypertension.  He is a former smoker.  He remains very active.  His last echo was 07-17-2014 with an EF of 45-55%.  Past Medical History:  Diagnosis Date  . Abnormal echocardiogram 05/31/12   EF = 45-50% with more global hypokinesis. There was eccentric hypertrophy of the ventricle. There was a trace AI and mild MR.  Marland Kitchen Acute renal insufficiency   . Arthritis   . Cardiomyopathy, ischemic, EF by cath 35% and Echo 35-40% with STEMI and PCI 12/21/2012  . CHF (congestive heart failure) (HCC)    Acute combined systolic and diastolic heart failure  . Coronary artery disease    CABG in 1999 which was a 5-vessel bypass and had a questionable history of A fib. He underwent monitoring whick showed sinus bradycardia and PACs but no evidence of A fib.  Marland Kitchen GERD (gastroesophageal reflux disease)   . High triglycerides   . Hypertension   . Obesity    unspecified  . Ruptured lumbar disc   . ST elevation myocardial infarction (STEMI) of inferior wall (HCC) 12/18/12   STEMI of inf. wall w/PCI with Xperdition stent to VG to distal RCA    Past Surgical History:  Procedure Laterality Date  .  BACK SURGERY     (5) back surgeries  . CORONARY ARTERY BYPASS GRAFT  1999   (CAD) CABG was a 5-vessel bypass and had a questionable history of A fib. He underwent monitoring whick showed sinus bradycardia and PACs but no evidence of A fib.  Marland Kitchen LEFT HEART CATHETERIZATION WITH CORONARY/GRAFT ANGIOGRAM  12/18/2012   Procedure: LEFT HEART CATHETERIZATION WITH Isabel Caprice;  Surgeon: [name], MD;  Location: [facility] CATH LAB;  Service: Cardiovascular;;  . LUMBAR DISC SURGERY    . open heart surgery  03/1998  . TONSILLECTOMY      Social History   Social History  . Marital status: Single    Spouse name: N/A  . Number of children: 1  . Years of education: N/A   Occupational History  . 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 Smoking status: Former Smoker    Packs/day: 4.50    Years: 30.00    Quit date: 05/07/1987  . Smokeless tobacco: Former Neurosurgeon    Types: Chew  . Alcohol use No  . Drug use: No  . Sexual activity: Not on file   Other Topics Concern  . Not on file   Social  History Narrative  . No narrative on file    Family History  Problem Relation Age of Onset  . CAD Mother   . Heart disease Mother   . CAD Brother   . Heart attack Brother   . Hyperlipidemia Sister   . Leukemia Father   . CAD Maternal Grandmother   . Brain cancer Brother   . CAD Sister     Allergies as of 12/27/2015 - Review Complete 12/27/2015  Allergen Reaction Noted  . Aspirin Nausea And Vomiting 07/01/2012  . Other Other (See Comments) 07/01/2012    No current facility-administered medications on file prior to encounter.    Current Outpatient Prescriptions on File Prior to Encounter  Medication Sig Dispense Refill  . acetaminophen (TYLENOL) 325 MG tablet Take 2 tablets (650 mg total) by mouth every 4 (four) hours as needed. (Patient taking differently: Take 650 mg by mouth every 4 (four) hours as needed for mild pain. )    . aspirin EC 81 MG EC tablet Take  1 tablet (81 mg total) by mouth daily.    Marland Kitchen atorvastatin (LIPITOR) 80 MG tablet TAKE 1 TABLET DAILY AT 6 PM. (Patient taking differently: Take 40mg  in the evening.) 30 tablet 11  . furosemide (LASIX) 20 MG tablet Take 1 tablet (20 mg total) by mouth daily. Please schedule appointment for refills. 15 tablet 0  . isosorbide mononitrate (IMDUR) 30 MG 24 hr tablet Take 1 tablet (30 mg total) by mouth daily. Please schedule appointment for refills. 15 tablet 0  . lisinopril (PRINIVIL,ZESTRIL) 5 MG tablet Take 1 tablet (5 mg total) by mouth daily. Please keep your upcoming appointment (10/15/15) for refills. 30 tablet 1  . LORazepam (ATIVAN) 0.5 MG tablet Take 0.5 mg by mouth every 8 (eight) hours as needed for anxiety.    . metoprolol tartrate (LOPRESSOR) 25 MG tablet Take 0.5 tablets (12.5 mg total) by mouth 2 (two) times daily. 30 tablet 11  . nitroGLYCERIN (NITROSTAT) 0.4 MG SL tablet Place 1 tablet (0.4 mg total) under the tongue every 5 (five) minutes as needed for chest pain. 25 tablet 3  . ranitidine (ZANTAC) 150 MG tablet Take 150 mg by mouth 2 (two) times daily.       REVIEW OF SYSTEMS: See HPI, all other systems negative  PHYSICAL EXAMINATION: General: The patient appears their stated age.  Vital signs are BP 116/68   Pulse 97   Temp 100.1 F (37.8 C) (Oral)   Resp 20   SpO2 94%  Pulmonary: Respirations are non-labored HEENT:  No gross abnormalities Abdomen: Soft and non-tender  Musculoskeletal: There are no major deformities.   Neurologic: No focal weakness or paresthesias are detected, Skin: There are no ulcer or rashes noted. Psychiatric: The patient has normal affect. Cardiovascular: There is a regular rate and rhythm without significant murmur appreciated.  Diagnostic Studies: I have reviewed his CT scan with the following findings: 1. Mild proximal and mid small bowel ileus or partial obstruction. 2. Large infrarenal abdominal aortic aneurysm with a maximum diameter of  8.7 cm. Vascular surgery consultation recommended due to increased risk of rupture for AAA >5.5 cm. This recommendation follows ACR consensus guidelines: White Paper of the ACR Incidental Findings Committee II on Vascular Findings. J Am Coll Radiol 2013; 10:789-794. 3. 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 4. Aortic atherosclerosis. 5. Mild diffuse hepatic steatosis. 6. Bibasilar atelectasis. 7. Small bilateral inguinal hernias containing fat.   Assessment:  Large AAA Plan:  I discussed with the patient and his family, that his AAA most likely is not the cause of his abdominal pain, nausea and diarrhea.  However he has a very large AAA, and I would recommend repair if he does not have any infectious symptoms overnight.  We discussed the procedure in detail, including the risk not limited to death, intestinal ischemia, kidney failure, death, bleeding.  I believe he is a good endovascular candidate.He will be hydrated overnight with plans for EVAR tomorrow am.     Jorge Ny, M.D. [facility] Office: [phone] Pager:  [phone]

## 2015-12-27 NOTE — ED Notes (Signed)
Pt placed on 2 lpm [place]. spo2 prior to o2 therapy 87%. Pt no 95% on 2lpm. Pt denies wearing o2 at home or difficulty breathing.

## 2015-12-27 NOTE — Consult Note (Signed)
Reason for Consult:psbo vs ileus, n/v Referring Physician: Gloriann Loan, PA  Ryan Wise is an 75 y.o. male.  HPI: 75 yo male with ischemic cardiomyopathy, GERD, HTN, h/o CHF, CAD comes in to ED with n/v/diarrhea. After eating cereal last night he developed diarrhea followed later by nausea and vomiting. He had multiple episodes of n & v today along with ongoing diarrhea. He developed some abdominal pain today but he believes it is secondary to vomiting so much. No sick contacts, foreign travel, abx. No one else. Had some blood on TP. Has hemorrhoids. No prior episodes.   Past Medical History:  Diagnosis Date  . Abnormal echocardiogram 05/31/12   EF = 45-50% with more global hypokinesis. There was eccentric hypertrophy of the ventricle. There was a trace AI and mild MR.  Marland Kitchen Acute renal insufficiency   . Arthritis   . Cardiomyopathy, ischemic, EF by cath 35% and Echo 35-40% with STEMI and PCI 12/21/2012  . CHF (congestive heart failure) (HCC)    Acute combined systolic and diastolic heart failure  . Coronary artery disease    CABG in 1999 which was a 5-vessel bypass and had a questionable history of A fib. He underwent monitoring whick showed sinus bradycardia and PACs but no evidence of A fib.  Marland Kitchen GERD (gastroesophageal reflux disease)   . High triglycerides   . Hypertension   . Obesity    unspecified  . Ruptured lumbar disc   . ST elevation myocardial infarction (STEMI) of inferior wall ([place]) 12/18/12   STEMI of inf. wall w/PCI with Xperdition stent to VG to distal RCA    Past Surgical History:  Procedure Laterality Date  . BACK SURGERY     (5) back surgeries  . CORONARY ARTERY BYPASS GRAFT  1999   (CAD) CABG was a 5-vessel bypass and had a questionable history of A fib. He underwent monitoring whick showed sinus bradycardia and PACs but no evidence of A fib.  Marland Kitchen LEFT HEART CATHETERIZATION WITH CORONARY/GRAFT ANGIOGRAM  12/18/2012   Procedure: LEFT HEART CATHETERIZATION WITH Beatrix Fetters;  Surgeon: Troy Sine, MD;  Location: [facility] CATH LAB;  Service: Cardiovascular;;  . LUMBAR DISC SURGERY    . open heart surgery  03/1998  . TONSILLECTOMY      Family History  Problem Relation Age of Onset  . CAD Mother   . Heart disease Mother   . CAD Brother   . Heart attack Brother   . Hyperlipidemia Sister   . Leukemia Father   . CAD Maternal Grandmother   . Brain cancer Brother   . CAD Sister     Social History:  reports that he quit smoking about 28 years ago. He has a 135.00 pack-year smoking history. He has quit using smokeless tobacco. His smokeless tobacco use included Chew. He reports that he does not drink alcohol or use drugs.  Allergies:  Allergies  Allergen Reactions  . Aspirin Nausea And Vomiting    Can't take full strength uncoated aspirin  . Other Other (See Comments)    Pain medication that he was given after open heart surgery-sweating and hallucinations     Medications: I have reviewed the patient's current medications.  Results for orders placed or performed during the hospital encounter of 12/27/15 (from the past 48 hour(s))  Lipase, blood     Status: None   Collection Time: 12/27/15  2:44 PM  Result Value Ref Range   Lipase 15 11 - 51 U/L  Comprehensive metabolic panel  Status: Abnormal   Collection Time: 12/27/15  2:44 PM  Result Value Ref Range   Sodium 140 135 - 145 mmol/L   Potassium 3.6 3.5 - 5.1 mmol/L   Chloride 106 101 - 111 mmol/L   CO2 23 22 - 32 mmol/L   Glucose, Bld 152 (H) 65 - 99 mg/dL   BUN 17 6 - 20 mg/dL   Creatinine, Ser 1.12 0.61 - 1.24 mg/dL   Calcium 9.0 8.9 - 10.3 mg/dL   Total Protein 6.9 6.5 - 8.1 g/dL   Albumin 3.9 3.5 - 5.0 g/dL   AST 23 15 - 41 U/L   ALT 18 17 - 63 U/L   Alkaline Phosphatase 64 38 - 126 U/L   Total Bilirubin 2.1 (H) 0.3 - 1.2 mg/dL   GFR calc non Af Amer >60 >60 mL/min   GFR calc Af Amer >60 >60 mL/min    Comment: (NOTE) The eGFR has been calculated using the CKD EPI  equation. This calculation has not been validated in all clinical situations. eGFR's persistently <60 mL/min signify possible Chronic Kidney Disease.    Anion gap 11 5 - 15  CBC     Status: None   Collection Time: 12/27/15  2:44 PM  Result Value Ref Range   WBC 7.3 4.0 - 10.5 K/uL   RBC 5.16 4.22 - 5.81 MIL/uL   Hemoglobin 14.7 13.0 - 17.0 g/dL   HCT 45.9 39.0 - 52.0 %   MCV 89.0 78.0 - 100.0 fL   MCH 28.5 26.0 - 34.0 pg   MCHC 32.0 30.0 - 36.0 g/dL   RDW 14.4 11.5 - 15.5 %   Platelets 207 150 - 400 K/uL  I-Stat CG4 Lactic Acid, ED     Status: Abnormal   Collection Time: 12/27/15  6:12 PM  Result Value Ref Range   Lactic Acid, Venous 1.93 (HH) 0.5 - 1.9 mmol/L    Ct Abdomen Pelvis W Contrast  Result Date: 12/27/2015 CLINICAL DATA:  Nonfocal abdominal pain, nausea, vomiting and diarrhea since last night. EXAM: CT ABDOMEN AND PELVIS WITH CONTRAST TECHNIQUE: Multidetector CT imaging of the abdomen and pelvis was performed using the standard protocol following bolus administration of intravenous contrast. CONTRAST:  1 ISOVUE-300 IOPAMIDOL (ISOVUE-300) INJECTION 61% COMPARISON:  Abdomen ultrasound dated 08/27/2014. FINDINGS: Lower chest:  Bibasilar atelectasis.  No pleural fluid. Hepatobiliary: Mild diffuse low density of the liver relative to the spleen. Small liver calcified granuloma. Normal appearing gallbladder. Pancreas: No mass, inflammatory changes, or other significant abnormality. Spleen: Within normal limits in size and appearance. Adrenals/Urinary Tract: Normal appearing adrenal glands, kidneys, ureters and urinary bladder. No urinary tract calculi or hydronephrosis. Stomach/Bowel: Multiple mildly dilated proximal and mid small bowel loops. Normal caliber distal small bowel and colon. A discrete point of transition is not identified. No obstructing mass is seen. Normal appearing appendix. Vascular/Lymphatic: There is a large infrarenal abdominal aortic aneurysm. This has a smaller  proximal component and larger distal component, together measuring 11.3 cm in length on sagittal image number 100. In the axial plane, this measures 8.7 cm in maximum diameter. There is a large amount of eccentric mural thrombus anteriorly. Also demonstrated is mild aneurysmal dilatation of the proximal common iliac arteries. The common iliac artery on the left has a transverse diameter of 2.5 cm on image number 71 of series 201 and the common iliac artery on the right has a maximum AP diameter of 2.6 cm on image number 69 of series 201. There are atheromatous  arterial calcifications, including the abdominal aorta. No enlarged lymph nodes are seen. Reproductive: Normal sized prostate gland containing a small amount of coarse calcification. Other: Small bilateral inguinal hernias containing fat. Musculoskeletal: Lumbar and lower thoracic spine degenerative changes and mild scoliosis. IMPRESSION: 1. Mild proximal and mid small bowel ileus or partial obstruction. 2. Large infrarenal abdominal aortic aneurysm with a maximum diameter of 8.7 cm. Vascular surgery consultation recommended due to increased risk of rupture for AAA >5.5 cm. This recommendation follows ACR consensus guidelines: White Paper of the ACR Incidental Findings Committee II on Vascular Findings. J Am Coll Radiol 2013; 10:789-794. 3. 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 4. Aortic atherosclerosis. 5. Mild diffuse hepatic steatosis. 6. Bibasilar atelectasis. 7. Small bilateral inguinal hernias containing fat. Electronically Signed   By: Claudie Revering M.D.   On: 12/27/2015 19:20    Review of Systems  Constitutional: Positive for fever (subjective). Negative for weight loss.  HENT: Negative for nosebleeds.   Eyes: Negative for blurred vision.  Respiratory: Negative for shortness of breath.   Cardiovascular: Negative for chest pain, palpitations, orthopnea and PND.       Denies DOE  Gastrointestinal:  Positive for diarrhea, nausea and vomiting.  Genitourinary: Negative for dysuria and hematuria.  Musculoskeletal: Negative.   Skin: Negative for itching and rash.  Neurological: Negative for dizziness, focal weakness, seizures, loss of consciousness and headaches.       Denies TIAs, amaurosis fugax  Endo/Heme/Allergies: Does not bruise/bleed easily.  Psychiatric/Behavioral: The patient is not nervous/anxious.    Blood pressure 119/80, pulse 104, temperature 100.1 F (37.8 C), temperature source Oral, resp. rate 23, SpO2 92 %. Physical Exam  Vitals reviewed. Constitutional: He is oriented to person, place, and time. He appears well-developed and well-nourished. No distress.  Morbidly obese, resting comfortably, not ill appearing  HENT:  Head: Normocephalic and atraumatic.  Right Ear: External ear normal.  Left Ear: External ear normal.  Eyes: Conjunctivae are normal. No scleral icterus.  Neck: Normal range of motion. Neck supple. No tracheal deviation present. No thyromegaly present.  Cardiovascular: Normal rate and normal heart sounds.   Respiratory: Effort normal and breath sounds normal. No stridor. No respiratory distress. He has no wheezes.  GI: Soft. He exhibits no distension. There is no tenderness. There is no rebound and no guarding.  Protuberant round abd, soft, NT. HypoBS.   Musculoskeletal: He exhibits no edema or tenderness.  Lymphadenopathy:    He has no cervical adenopathy.  Neurological: He is alert and oriented to person, place, and time. He exhibits normal muscle tone.  Skin: Skin is warm and dry. No rash noted. He is not diaphoretic. No erythema. No pallor.  Psychiatric: He has a normal mood and affect. His behavior is normal. Judgment and thought content normal.    Assessment/Plan: Nausea, vomiting, diarrhea, abdominal pain Infrarenal abdominal Aortic aneurysm with bilateral iliac extension CAD s/p CABG H/o ischemic cardiomyopathy Fatty liver Bilateral  inguinal hernias   Clears as tolerated. If has additional episodes of vomiting, bowel rest.  Don't think pt has bowel obstruction given multiple BMs.  Seems more like gastroenteritis clinically Suspicion for infectious etiology low Repeat labs in AM IV hydration given volume losses and plan for EVAR in am If no events overnight, (afebrile, no vomiting, no bump in labs in am), should be ok for EVAR in am with vascular  Leighton Ruff. Redmond Pulling, MD, FACS General, Bariatric, & Minimally Invasive Surgery [facility], [place]  Greer Pickerel M 12/27/2015, 8:55 PM

## 2015-12-27 NOTE — ED Notes (Signed)
Patient reports new onset diarrhea last night 2000, ate cereal at 1800. Abdominal pain and nausea then came on and has progressed throughout the day. 6 x emesis, about 8 x diarrhea - pt reports bright red diarrhea (pt states "that has happened before though, off and on 3 or 4 years" hx hemorrhoids).

## 2015-12-27 NOTE — ED Triage Notes (Signed)
Pt reports abd discomfort, n/v/d since last night.

## 2015-12-27 NOTE — ED Provider Notes (Signed)
[facility]-EMERGENCY DEPT Provider Note   CSN: [id number] Arrival date & time: 12/27/15  1412  First Provider Contact:  First MD Initiated Contact with Patient 12/27/15 1736        History   Chief Complaint Chief Complaint  Patient presents with  . Abdominal Pain  . Emesis  . Diarrhea    HPI Egypt Marchiano is a 75 y.o. male.   Emesis   Associated symptoms include abdominal pain, chills, diarrhea and sweats.  Diarrhea   This is a new problem. The current episode started yesterday. The problem occurs 5 to 10 times per day. The problem has been gradually improving. Diarrhea characteristics: watery with bright red blood. The maximum temperature recorded prior to his arrival was 100 to 100.9 F. Associated symptoms include abdominal pain, vomiting, chills and sweats. He has tried nothing for the symptoms. Risk factors: No recent travel, abx use, or sick contacts. His past medical history does not include recent abdominal surgery.   Dezmon Conover is a 75 y.o. male with PMH significant for CHF (EF 45-50%, 2016), CAD, ICM, GERD, dyslipidemia who presents with sudden onset, moderate, slightly improving diarrhea.  Patient reports 8 episodes of watery diarrhea with bright red blood (hx of hemorrhoids) along with 6 episodes of NBNB emesis and generalized abdominal pain.  Associated symptoms include chills, temp in triage 100.  Denies abdominal distention, urinary symptoms, CP, SOB, or rectal pain.  No sick contacts, recent travel, or abx use.  Patient reports he received zofran in triage which has alleviated his nausea and has been able to tolerate ice chips.  Last episode of diarrhea this morning around noon.  No hx of abdominal surgeries.   Past Medical History:  Diagnosis Date  . Abnormal echocardiogram 05/31/12   EF = 45-50% with more global hypokinesis. There was eccentric hypertrophy of the ventricle. There was a trace AI and mild MR.  Marland Kitchen Acute renal insufficiency   . Arthritis   . Cardiomyopathy,  ischemic, EF by cath 35% and Echo 35-40% with STEMI and PCI 12/21/2012  . CHF (congestive heart failure) (HCC)    Acute combined systolic and diastolic heart failure  . Coronary artery disease    CABG in 1999 which was a 5-vessel bypass and had a questionable history of A fib. He underwent monitoring whick showed sinus bradycardia and PACs but no evidence of A fib.  Marland Kitchen GERD (gastroesophageal reflux disease)   . High triglycerides   . Hypertension   . Obesity    unspecified  . Ruptured lumbar disc   . ST elevation myocardial infarction (STEMI) of inferior wall (HCC) 12/18/12   STEMI of inf. wall w/PCI with Xperdition stent to VG to distal RCA    Patient Active Problem List   Diagnosis Date Noted  . AAA (abdominal aortic aneurysm) without rupture (HCC) 12/27/2015  . DOE (dyspnea on exertion) 01/08/2013  . DJD (degenerative joint disease)- 5 back surgeries 12/22/2012  . Obesity, unspecified 12/22/2012  . Acute renal insufficiency 12/22/2012  . Cardiomyopathy, ischemic, EF by cath 35%  12/21/2012  . Dyslipidemia - high TG, on fibrate 12/20/2012  . Acute combined systolic and diastolic heart failure -  12/20/2012  . CAD, CABG '99 12/18/2012  . STEMI of inf. wall with PCI with Xpedition stent to VG to distal RCA; 12/18/12 12/18/2012    Past Surgical History:  Procedure Laterality Date  . BACK SURGERY     (5) back surgeries  . CORONARY ARTERY BYPASS GRAFT  1999   (CAD)  CABG was a 5-vessel bypass and had a questionable history of A fib. He underwent monitoring whick showed sinus bradycardia and PACs but no evidence of A fib.  Marland Kitchen LEFT HEART CATHETERIZATION WITH CORONARY/GRAFT ANGIOGRAM  12/18/2012   Procedure: LEFT HEART CATHETERIZATION WITH Isabel Caprice;  Surgeon: [name], MD;  Location: [facility] CATH LAB;  Service: Cardiovascular;;  . LUMBAR DISC SURGERY    . open heart surgery  03/1998  . TONSILLECTOMY         Home Medications    Prior to Admission medications     Medication Sig Start Date End Date Taking? Authorizing Provider  acetaminophen (TYLENOL) 325 MG tablet Take 2 tablets (650 mg total) by mouth every 4 (four) hours as needed. Patient taking differently: Take 650 mg by mouth every 4 (four) hours as needed for mild pain.  12/22/12   Abelino Derrick, PA-C  aspirin EC 81 MG EC tablet Take 1 tablet (81 mg total) by mouth daily. 12/22/12   Abelino Derrick, PA-C  atorvastatin (LIPITOR) 80 MG tablet TAKE 1 TABLET DAILY AT 6 PM. 04/18/15   Chrystie Nose, MD  atorvastatin (LIPITOR) 80 MG tablet Take 1 tablet (80 mg total) by mouth daily at 6 PM. 04/18/15   Chrystie Nose, MD  furosemide (LASIX) 20 MG tablet Take 1 tablet (20 mg total) by mouth daily. Please schedule appointment for refills. 12/07/15   Chrystie Nose, MD  isosorbide mononitrate (IMDUR) 30 MG 24 hr tablet Take 1 tablet (30 mg total) by mouth daily. Please schedule appointment for refills. 12/07/15   Chrystie Nose, MD  lisinopril (PRINIVIL,ZESTRIL) 5 MG tablet Take 1 tablet (5 mg total) by mouth daily. Please keep your upcoming appointment (10/15/15) for refills. 10/08/15   Chrystie Nose, MD  LORazepam (ATIVAN) 0.5 MG tablet Take 0.5 mg by mouth every 8 (eight) hours as needed for anxiety. 01/07/13   Chrystie Nose, MD  metoprolol tartrate (LOPRESSOR) 25 MG tablet Take 0.5 tablets (12.5 mg total) by mouth 2 (two) times daily. 07/06/13   Chrystie Nose, MD  nitroGLYCERIN (NITROSTAT) 0.4 MG SL tablet Place 1 tablet (0.4 mg total) under the tongue every 5 (five) minutes as needed for chest pain. 12/22/12   Abelino Derrick, PA-C  ranitidine (ZANTAC) 150 MG tablet Take 150 mg by mouth 2 (two) times daily.    Historical Provider, MD    Family History Family History  Problem Relation Age of Onset  . CAD Mother   . Heart disease Mother   . CAD Brother   . Heart attack Brother   . Hyperlipidemia Sister   . Leukemia Father   . CAD Maternal Grandmother   . Brain cancer Brother   . CAD Sister      Social History Social History  Substance Use Topics  . Smoking status: Former Smoker    Packs/day: 4.50    Years: 30.00    Quit date: 05/07/1987  . Smokeless tobacco: Former Neurosurgeon    Types: Chew  . Alcohol use No     Allergies   Aspirin and Other   Review of Systems Review of Systems  Constitutional: Positive for chills.  Respiratory: Negative for shortness of breath.   Cardiovascular: Negative for chest pain.  Gastrointestinal: Positive for abdominal pain, blood in stool, diarrhea, nausea and vomiting. Negative for abdominal distention, anal bleeding, constipation and rectal pain.  Genitourinary: Negative for dysuria, frequency and hematuria.  Musculoskeletal: Negative for back pain.  Physical Exam Updated Vital Signs BP 116/68   Pulse 97   Temp 100.1 F (37.8 C) (Oral)   Resp 20   SpO2 94%   Physical Exam  Constitutional: He is oriented to person, place, and time. He appears well-developed and well-nourished.  Non-toxic appearance. He does not have a sickly appearance. He does not appear ill.  HENT:  Head: Normocephalic and atraumatic.  Mouth/Throat: Oropharynx is clear and moist.  Eyes: Conjunctivae are normal. Pupils are equal, round, and reactive to light.  Neck: Normal range of motion. Neck supple.  Cardiovascular: Normal rate and regular rhythm.   Pulmonary/Chest: Effort normal and breath sounds normal. No accessory muscle usage or stridor. No respiratory distress. He has no wheezes. He has no rhonchi. He has no rales.  Abdominal: Soft. Bowel sounds are normal. He exhibits no distension and no mass. There is generalized tenderness. There is no guarding.  Musculoskeletal: Normal range of motion.  Lymphadenopathy:    He has no cervical adenopathy.  Neurological: He is alert and oriented to person, place, and time.  Speech clear without dysarthria.  Skin: Skin is warm and dry.  Psychiatric: He has a normal mood and affect. His behavior is normal.      ED Treatments / Results  Labs (all labs ordered are listed, but only abnormal results are displayed) Labs Reviewed  COMPREHENSIVE METABOLIC PANEL - Abnormal; Notable for the following:       Result Value   Glucose, Bld 152 (*)    Total Bilirubin 2.1 (*)    All other components within normal limits  I-STAT CG4 LACTIC ACID, ED - Abnormal; Notable for the following:    Lactic Acid, Venous 1.93 (*)    All other components within normal limits  GASTROINTESTINAL PANEL BY PCR, STOOL (REPLACES STOOL CULTURE)  C DIFFICILE QUICK SCREEN W PCR REFLEX  LIPASE, BLOOD  CBC  URINALYSIS, ROUTINE W REFLEX MICROSCOPIC (NOT AT [facility])  BASIC METABOLIC PANEL  I-STAT CG4 LACTIC ACID, ED    EKG  EKG Interpretation None       Radiology Ct Abdomen Pelvis W Contrast  Result Date: 12/27/2015 CLINICAL DATA:  Nonfocal abdominal pain, nausea, vomiting and diarrhea since last night. EXAM: CT ABDOMEN AND PELVIS WITH CONTRAST TECHNIQUE: Multidetector CT imaging of the abdomen and pelvis was performed using the standard protocol following bolus administration of intravenous contrast. CONTRAST:  1 ISOVUE-300 IOPAMIDOL (ISOVUE-300) INJECTION 61% COMPARISON:  Abdomen ultrasound dated 08/27/2014. FINDINGS: Lower chest:  Bibasilar atelectasis.  No pleural fluid. Hepatobiliary: Mild diffuse low density of the liver relative to the spleen. Small liver calcified granuloma. Normal appearing gallbladder. Pancreas: No mass, inflammatory changes, or other significant abnormality. Spleen: Within normal limits in size and appearance. Adrenals/Urinary Tract: Normal appearing adrenal glands, kidneys, ureters and urinary bladder. No urinary tract calculi or hydronephrosis. Stomach/Bowel: Multiple mildly dilated proximal and mid small bowel loops. Normal caliber distal small bowel and colon. A discrete point of transition is not identified. No obstructing mass is seen. Normal appearing appendix. Vascular/Lymphatic: There is a  large infrarenal abdominal aortic aneurysm. This has a smaller proximal component and larger distal component, together measuring 11.3 cm in length on sagittal image number 100. In the axial plane, this measures 8.7 cm in maximum diameter. There is a large amount of eccentric mural thrombus anteriorly. Also demonstrated is mild aneurysmal dilatation of the proximal common iliac arteries. The common iliac artery on the left has a transverse diameter of 2.5 cm on image number 71 of  series 201 and the common iliac artery on the right has a maximum AP diameter of 2.6 cm on image number 69 of series 201. There are atheromatous arterial calcifications, including the abdominal aorta. No enlarged lymph nodes are seen. Reproductive: Normal sized prostate gland containing a small amount of coarse calcification. Other: Small bilateral inguinal hernias containing fat. Musculoskeletal: Lumbar and lower thoracic spine degenerative changes and mild scoliosis. IMPRESSION: 1. Mild proximal and mid small bowel ileus or partial obstruction. 2. Large infrarenal abdominal aortic aneurysm with a maximum diameter of 8.7 cm. Vascular surgery consultation recommended due to increased risk of rupture for AAA >5.5 cm. This recommendation follows ACR consensus guidelines: White Paper of the ACR Incidental Findings Committee II on Vascular Findings. J Am Coll Radiol 2013; 10:789-794. 3. 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 4. Aortic atherosclerosis. 5. Mild diffuse hepatic steatosis. 6. Bibasilar atelectasis. 7. Small bilateral inguinal hernias containing fat. Electronically Signed   By: Beckie Salts M.D.   On: 12/27/2015 19:20    Procedures Procedures (including critical care time)  Medications Ordered in ED Medications  0.9 %  sodium chloride infusion (not administered)  ondansetron (ZOFRAN-ODT) disintegrating tablet 4 mg (4 mg Oral Given 12/27/15 1437)  sodium chloride 0.9 % bolus 500 mL  (0 mLs Intravenous Stopped 12/27/15 1919)  morphine 4 MG/ML injection 4 mg (4 mg Intravenous Given 12/27/15 1808)  sodium chloride 0.9 % bolus 500 mL (0 mLs Intravenous Stopped 12/27/15 2053)  iopamidol (ISOVUE-300) 61 % injection (100 mLs  Contrast Given 12/27/15 1848)     Initial Impression / Assessment and Plan / ED Course  I have reviewed the triage vital signs and the nursing notes.  Pertinent labs & imaging results that were available during my care of the patient were reviewed by me and considered in my medical decision making (see chart for details).  Clinical Course   Patient presents with sudden onset nausea vomiting and diarrhea along with generalized abdominal pain that began last night. VSS, NAD. He does have a mildly elevated lactic acid at 1.93. I suspect this is likely related to dehydration. Patient received 500 mL bolus, and lactate normalized to 1.53. Otherwise, lab work without acute abnormalities. Zofran and morphine for symptom control. CT scan was obtained for further evaluation and showed a mild proximal and mid small bowel ileus or partial obstruction. Spoke with general surgery, Dr. Andrey Campanile. This is likely related to gastroenteritis, and not a true obstruction. Incidentally, there was a large infrarenal abdominal aortic aneurysm measuring at 8.7 cm. Spoke with vascular surgery, Dr. Myra Gianotti, who recommends admission for likely surgery tomorrow.  Appreciate, Dr. Montez Morita.  Will admit to medicine of IV hydration, antiemetics, pain control.  NPO at midnight for likely surgery tomorrow.   Final Clinical Impressions(s) / ED Diagnoses   Final diagnoses:  AAA (abdominal aortic aneurysm) without rupture (HCC)  Nausea vomiting and diarrhea  Generalized abdominal pain    New Prescriptions New Prescriptions   No medications on file     Gwinda Maine 12/27/15 2135    Arby Barrette, MD 12/27/15 2344

## 2015-12-27 NOTE — ED Notes (Signed)
ATTEMPTED TO CALL REPORT 

## 2015-12-28 ENCOUNTER — Observation Stay (HOSPITAL_COMMUNITY): Payer: Medicare Other | Admitting: Certified Registered Nurse Anesthetist

## 2015-12-28 ENCOUNTER — Encounter (HOSPITAL_COMMUNITY): Payer: Self-pay | Admitting: *Deleted

## 2015-12-28 ENCOUNTER — Encounter (HOSPITAL_COMMUNITY)
Admission: EM | Disposition: A | Discharge status: Home / Self Care | Payer: Self-pay | Source: Home / Self Care | Attending: Family Medicine

## 2015-12-28 ENCOUNTER — Inpatient Hospital Stay (HOSPITAL_COMMUNITY): Payer: Medicare Other

## 2015-12-28 DIAGNOSIS — I5042 Chronic combined systolic (congestive) and diastolic (congestive) heart failure: Secondary | ICD-10-CM | POA: Diagnosis not present

## 2015-12-28 DIAGNOSIS — E669 Obesity, unspecified: Secondary | ICD-10-CM | POA: Diagnosis present

## 2015-12-28 DIAGNOSIS — I509 Heart failure, unspecified: Secondary | ICD-10-CM | POA: Diagnosis not present

## 2015-12-28 DIAGNOSIS — E860 Dehydration: Secondary | ICD-10-CM | POA: Diagnosis not present

## 2015-12-28 DIAGNOSIS — Z951 Presence of aortocoronary bypass graft: Secondary | ICD-10-CM | POA: Diagnosis not present

## 2015-12-28 DIAGNOSIS — I714 Abdominal aortic aneurysm, without rupture, unspecified: Secondary | ICD-10-CM | POA: Diagnosis present

## 2015-12-28 DIAGNOSIS — K529 Noninfective gastroenteritis and colitis, unspecified: Secondary | ICD-10-CM | POA: Diagnosis not present

## 2015-12-28 DIAGNOSIS — E785 Hyperlipidemia, unspecified: Secondary | ICD-10-CM | POA: Diagnosis present

## 2015-12-28 DIAGNOSIS — E7800 Pure hypercholesterolemia, unspecified: Secondary | ICD-10-CM | POA: Diagnosis present

## 2015-12-28 DIAGNOSIS — I252 Old myocardial infarction: Secondary | ICD-10-CM | POA: Diagnosis not present

## 2015-12-28 DIAGNOSIS — Z8249 Family history of ischemic heart disease and other diseases of the circulatory system: Secondary | ICD-10-CM | POA: Diagnosis not present

## 2015-12-28 DIAGNOSIS — K649 Unspecified hemorrhoids: Secondary | ICD-10-CM | POA: Diagnosis present

## 2015-12-28 DIAGNOSIS — Z955 Presence of coronary angioplasty implant and graft: Secondary | ICD-10-CM | POA: Diagnosis not present

## 2015-12-28 DIAGNOSIS — I255 Ischemic cardiomyopathy: Secondary | ICD-10-CM | POA: Diagnosis not present

## 2015-12-28 DIAGNOSIS — I2510 Atherosclerotic heart disease of native coronary artery without angina pectoris: Secondary | ICD-10-CM | POA: Diagnosis not present

## 2015-12-28 DIAGNOSIS — J9811 Atelectasis: Secondary | ICD-10-CM | POA: Diagnosis not present

## 2015-12-28 DIAGNOSIS — R112 Nausea with vomiting, unspecified: Secondary | ICD-10-CM | POA: Diagnosis not present

## 2015-12-28 DIAGNOSIS — K4020 Bilateral inguinal hernia, without obstruction or gangrene, not specified as recurrent: Secondary | ICD-10-CM | POA: Diagnosis present

## 2015-12-28 DIAGNOSIS — A084 Viral intestinal infection, unspecified: Secondary | ICD-10-CM | POA: Diagnosis not present

## 2015-12-28 DIAGNOSIS — M1990 Unspecified osteoarthritis, unspecified site: Secondary | ICD-10-CM | POA: Diagnosis not present

## 2015-12-28 DIAGNOSIS — I110 Hypertensive heart disease with heart failure: Secondary | ICD-10-CM | POA: Diagnosis not present

## 2015-12-28 DIAGNOSIS — Z6837 Body mass index (BMI) 37.0-37.9, adult: Secondary | ICD-10-CM | POA: Diagnosis not present

## 2015-12-28 DIAGNOSIS — Z7982 Long term (current) use of aspirin: Secondary | ICD-10-CM | POA: Diagnosis not present

## 2015-12-28 DIAGNOSIS — K219 Gastro-esophageal reflux disease without esophagitis: Secondary | ICD-10-CM | POA: Diagnosis present

## 2015-12-28 DIAGNOSIS — Z87891 Personal history of nicotine dependence: Secondary | ICD-10-CM | POA: Diagnosis not present

## 2015-12-28 DIAGNOSIS — I25810 Atherosclerosis of coronary artery bypass graft(s) without angina pectoris: Secondary | ICD-10-CM

## 2015-12-28 DIAGNOSIS — I739 Peripheral vascular disease, unspecified: Secondary | ICD-10-CM | POA: Diagnosis not present

## 2015-12-28 DIAGNOSIS — K760 Fatty (change of) liver, not elsewhere classified: Secondary | ICD-10-CM | POA: Diagnosis not present

## 2015-12-28 DIAGNOSIS — R197 Diarrhea, unspecified: Secondary | ICD-10-CM | POA: Diagnosis not present

## 2015-12-28 DIAGNOSIS — R1084 Generalized abdominal pain: Secondary | ICD-10-CM | POA: Diagnosis not present

## 2015-12-28 HISTORY — PX: ENDOVASCULAR STENT INSERTION: SHX5161

## 2015-12-28 LAB — BASIC METABOLIC PANEL
ANION GAP: 7 (ref 5–15)
ANION GAP: 6 (ref 5–15)
BUN: 18 mg/dL (ref 6–20)
BUN: 16 mg/dL (ref 6–20)
CALCIUM: 7.8 mg/dL — AB (ref 8.9–10.3)
CO2: 26 mmol/L (ref 22–32)
CO2: 26 mmol/L (ref 22–32)
Calcium: 7.7 mg/dL — ABNORMAL LOW (ref 8.9–10.3)
Chloride: 104 mmol/L (ref 101–111)
Chloride: 103 mmol/L (ref 101–111)
Creatinine, Ser: 1.06 mg/dL (ref 0.61–1.24)
Creatinine, Ser: 1.09 mg/dL (ref 0.61–1.24)
GFR calc Af Amer: >60 mL/min (ref >60)
GLUCOSE: 119 mg/dL — AB (ref 65–99)
GLUCOSE: 119 mg/dL — AB (ref 65–99)
POTASSIUM: 3.3 mmol/L — AB (ref 3.5–5.1)
POTASSIUM: 3.4 mmol/L — AB (ref 3.5–5.1)
SODIUM: 137 mmol/L (ref 135–145)
Sodium: 135 mmol/L (ref 135–145)

## 2015-12-28 LAB — MAGNESIUM: MAGNESIUM: 1.6 mg/dL — AB (ref 1.7–2.4)

## 2015-12-28 LAB — CBC
HEMATOCRIT: 40.9 % (ref 39.0–52.0)
HEMATOCRIT: 38 % — AB (ref 39.0–52.0)
HEMOGLOBIN: 12.7 g/dL — AB (ref 13.0–17.0)
Hemoglobin: 11.8 g/dL — ABNORMAL LOW (ref 13.0–17.0)
MCH: 28 pg (ref 26.0–34.0)
MCH: 28.2 pg (ref 26.0–34.0)
MCHC: 31.1 g/dL (ref 30.0–36.0)
MCHC: 31.1 g/dL (ref 30.0–36.0)
MCV: 90.1 fL (ref 78.0–100.0)
MCV: 90.9 fL (ref 78.0–100.0)
PLATELETS: 132 10*3/uL — AB (ref 150–400)
Platelets: 160 10*3/uL (ref 150–400)
RBC: 4.54 MIL/uL (ref 4.22–5.81)
RBC: 4.18 MIL/uL — ABNORMAL LOW (ref 4.22–5.81)
RDW: 14.9 % (ref 11.5–15.5)
RDW: 15 % (ref 11.5–15.5)
WBC: 3.8 10*3/uL — ABNORMAL LOW (ref 4.0–10.5)
WBC: 5.5 10*3/uL (ref 4.0–10.5)

## 2015-12-28 LAB — PROTIME-INR
INR: 1.15
INR: 1.22
Prothrombin Time: 14.8 seconds (ref 11.4–15.2)
Prothrombin Time: 15.5 seconds — ABNORMAL HIGH (ref 11.4–15.2)

## 2015-12-28 LAB — PREPARE RBC (CROSSMATCH)

## 2015-12-28 LAB — MRSA PCR SCREENING: MRSA BY PCR: NEGATIVE

## 2015-12-28 LAB — ABO/RH: ABO/RH(D): O NEG

## 2015-12-28 LAB — APTT: APTT: 31 s (ref 24–36)

## 2015-12-28 SURGERY — ENDOVASCULAR STENT GRAFT INSERTION
Anesthesia: General | Site: Abdomen

## 2015-12-28 MED ORDER — METOPROLOL TARTRATE 5 MG/5ML IV SOLN: 2.0000 mg | INTRAVENOUS | Status: DC | PRN

## 2015-12-28 MED ORDER — DOCUSATE SODIUM 100 MG PO CAPS
100.0000 mg | ORAL_CAPSULE | Freq: Every day | ORAL | Status: DC
  Administered 2015-12-29: 100 mg via ORAL
  Filled 2015-12-28: qty 1

## 2015-12-28 MED ORDER — SUCCINYLCHOLINE CHLORIDE 200 MG/10ML IV SOSY
PREFILLED_SYRINGE | INTRAVENOUS | Status: AC
  Filled 2015-12-28: qty 10

## 2015-12-28 MED ORDER — PROPOFOL 10 MG/ML IV BOLUS
INTRAVENOUS | Status: AC
  Filled 2015-12-28: qty 20

## 2015-12-28 MED ORDER — ONDANSETRON HCL 4 MG/2ML IJ SOLN
INTRAMUSCULAR | Status: DC | PRN
  Administered 2015-12-28: 4 mg via INTRAVENOUS

## 2015-12-28 MED ORDER — DEXTROSE 5 % IV SOLN
1.5000 g | Freq: Two times a day (BID) | INTRAVENOUS | Status: AC
  Administered 2015-12-28 – 2015-12-29 (×2): 1.5 g via INTRAVENOUS
  Filled 2015-12-28 (×2): qty 1.5

## 2015-12-28 MED ORDER — ROCURONIUM BROMIDE 50 MG/5ML IV SOLN
INTRAVENOUS | Status: AC
  Filled 2015-12-28: qty 1

## 2015-12-28 MED ORDER — SODIUM CHLORIDE 0.9 % IV SOLN: 500.0000 mL | Freq: Once | INTRAVENOUS | Status: DC | PRN

## 2015-12-28 MED ORDER — LABETALOL HCL 5 MG/ML IV SOLN: 10.0000 mg | INTRAVENOUS | Status: DC | PRN

## 2015-12-28 MED ORDER — PANTOPRAZOLE SODIUM 40 MG PO TBEC
40.0000 mg | DELAYED_RELEASE_TABLET | Freq: Every day | ORAL | Status: DC
  Administered 2015-12-28 – 2015-12-29 (×2): 40 mg via ORAL
  Filled 2015-12-28 (×2): qty 1

## 2015-12-28 MED ORDER — HYDRALAZINE HCL 20 MG/ML IJ SOLN: 5.0000 mg | INTRAMUSCULAR | Status: DC | PRN

## 2015-12-28 MED ORDER — HYDROMORPHONE HCL 1 MG/ML IJ SOLN
INTRAMUSCULAR | Status: AC
  Administered 2015-12-28: 0.5 mg via INTRAVENOUS
  Filled 2015-12-28: qty 1

## 2015-12-28 MED ORDER — ALUM & MAG HYDROXIDE-SIMETH 200-200-20 MG/5ML PO SUSP: 15.0000 mL | ORAL | Status: DC | PRN

## 2015-12-28 MED ORDER — LACTATED RINGERS IV SOLN
INTRAVENOUS | Status: DC
  Administered 2015-12-28: 10:00:00 via INTRAVENOUS

## 2015-12-28 MED ORDER — PROTAMINE SULFATE 10 MG/ML IV SOLN
INTRAVENOUS | Status: DC | PRN
  Administered 2015-12-28 (×2): 20 mg via INTRAVENOUS
  Administered 2015-12-28: 10 mg via INTRAVENOUS

## 2015-12-28 MED ORDER — LIDOCAINE 2% (20 MG/ML) 5 ML SYRINGE
INTRAMUSCULAR | Status: AC
  Filled 2015-12-28: qty 5

## 2015-12-28 MED ORDER — LACTATED RINGERS IV SOLN
INTRAVENOUS | Status: DC | PRN
  Administered 2015-12-28 (×2): via INTRAVENOUS

## 2015-12-28 MED ORDER — LACTATED RINGERS IV SOLN
INTRAVENOUS | Status: DC | PRN
  Administered 2015-12-28: 11:00:00 via INTRAVENOUS

## 2015-12-28 MED ORDER — PROPOFOL 10 MG/ML IV BOLUS
INTRAVENOUS | Status: DC | PRN
  Administered 2015-12-28: 150 mg via INTRAVENOUS
  Administered 2015-12-28: 20 mg via INTRAVENOUS

## 2015-12-28 MED ORDER — IODIXANOL 320 MG/ML IV SOLN
INTRAVENOUS | Status: DC | PRN
Start: 2015-12-28 — End: 2015-12-28
  Administered 2015-12-28: 110.8 mL via INTRAVENOUS

## 2015-12-28 MED ORDER — FENTANYL CITRATE (PF) 250 MCG/5ML IJ SOLN
INTRAMUSCULAR | Status: AC
  Filled 2015-12-28: qty 5

## 2015-12-28 MED ORDER — MAGNESIUM SULFATE 2 GM/50ML IV SOLN: 2.0000 g | Freq: Every day | INTRAVENOUS | Status: DC | PRN

## 2015-12-28 MED ORDER — MORPHINE SULFATE (PF) 2 MG/ML IV SOLN: 2.0000 mg | INTRAVENOUS | Status: DC | PRN

## 2015-12-28 MED ORDER — HYDROMORPHONE HCL 1 MG/ML IJ SOLN
0.2500 mg | INTRAMUSCULAR | Status: DC | PRN
  Administered 2015-12-28 (×2): 0.5 mg via INTRAVENOUS

## 2015-12-28 MED ORDER — LIDOCAINE HCL (CARDIAC) 20 MG/ML IV SOLN
INTRAVENOUS | Status: DC | PRN
  Administered 2015-12-28: 100 mg via INTRAVENOUS

## 2015-12-28 MED ORDER — PROMETHAZINE HCL 25 MG/ML IJ SOLN: 6.2500 mg | INTRAMUSCULAR | Status: DC | PRN

## 2015-12-28 MED ORDER — ROCURONIUM BROMIDE 100 MG/10ML IV SOLN
INTRAVENOUS | Status: DC | PRN
  Administered 2015-12-28: 40 mg via INTRAVENOUS
  Administered 2015-12-28: 10 mg via INTRAVENOUS

## 2015-12-28 MED ORDER — FENTANYL CITRATE (PF) 100 MCG/2ML IJ SOLN
INTRAMUSCULAR | Status: DC | PRN
  Administered 2015-12-28: 25 ug via INTRAVENOUS
  Administered 2015-12-28: 150 ug via INTRAVENOUS
  Administered 2015-12-28: 25 ug via INTRAVENOUS
  Administered 2015-12-28: 50 ug via INTRAVENOUS

## 2015-12-28 MED ORDER — METOPROLOL TARTRATE 25 MG PO TABS
25.0000 mg | ORAL_TABLET | Freq: Once | ORAL | Status: AC
  Administered 2015-12-28: 25 mg via ORAL
  Filled 2015-12-28: qty 1

## 2015-12-28 MED ORDER — PHENYLEPHRINE HCL 10 MG/ML IJ SOLN
INTRAVENOUS | Status: DC | PRN
  Administered 2015-12-28: 20 ug/min via INTRAVENOUS

## 2015-12-28 MED ORDER — METOPROLOL TARTRATE 12.5 MG HALF TABLET
ORAL_TABLET | ORAL | Status: AC
  Administered 2015-12-28: 25 mg via ORAL
  Filled 2015-12-28: qty 2

## 2015-12-28 MED ORDER — SODIUM CHLORIDE 0.9 % IV SOLN: Freq: Once | INTRAVENOUS | Status: DC

## 2015-12-28 MED ORDER — SUGAMMADEX SODIUM 200 MG/2ML IV SOLN
INTRAVENOUS | Status: AC
  Filled 2015-12-28: qty 2

## 2015-12-28 MED ORDER — ENOXAPARIN SODIUM 30 MG/0.3ML ~~LOC~~ SOLN
30.0000 mg | SUBCUTANEOUS | Status: DC
  Administered 2015-12-29: 30 mg via SUBCUTANEOUS
  Filled 2015-12-28 (×2): qty 0.3

## 2015-12-28 MED ORDER — ONDANSETRON HCL 4 MG/2ML IJ SOLN
INTRAMUSCULAR | Status: AC
  Filled 2015-12-28: qty 2

## 2015-12-28 MED ORDER — PHENOL 1.4 % MT LIQD
1.0000 | OROMUCOSAL | Status: DC | PRN
  Administered 2015-12-28: 1 via OROMUCOSAL
  Filled 2015-12-28: qty 177

## 2015-12-28 MED ORDER — SODIUM CHLORIDE 0.9 % IV SOLN
INTRAVENOUS | Status: AC
  Administered 2015-12-28: 17:00:00 via INTRAVENOUS

## 2015-12-28 MED ORDER — SODIUM CHLORIDE 0.9 % IV SOLN
INTRAVENOUS | Status: DC | PRN
  Administered 2015-12-28: 500 mL

## 2015-12-28 MED ORDER — POTASSIUM CHLORIDE CRYS ER 20 MEQ PO TBCR: 20.0000 meq | EXTENDED_RELEASE_TABLET | Freq: Every day | ORAL | Status: DC | PRN

## 2015-12-28 MED ORDER — ATORVASTATIN CALCIUM 40 MG PO TABS
40.0000 mg | ORAL_TABLET | Freq: Every day | ORAL | Status: DC
  Administered 2015-12-28 (×2): 40 mg via ORAL
  Filled 2015-12-28 (×2): qty 1

## 2015-12-28 MED ORDER — HEPARIN SODIUM (PORCINE) 1000 UNIT/ML IJ SOLN
INTRAMUSCULAR | Status: AC
  Filled 2015-12-28: qty 2

## 2015-12-28 MED ORDER — GUAIFENESIN-DM 100-10 MG/5ML PO SYRP: 15.0000 mL | ORAL_SOLUTION | ORAL | Status: DC | PRN

## 2015-12-28 MED ORDER — SUGAMMADEX SODIUM 500 MG/5ML IV SOLN
INTRAVENOUS | Status: DC | PRN
  Administered 2015-12-28: 200 mg via INTRAVENOUS

## 2015-12-28 MED ORDER — OXYCODONE-ACETAMINOPHEN 5-325 MG PO TABS
1.0000 | ORAL_TABLET | ORAL | Status: DC | PRN
  Filled 2015-12-28: qty 2

## 2015-12-28 MED ORDER — SUCCINYLCHOLINE CHLORIDE 20 MG/ML IJ SOLN
INTRAMUSCULAR | Status: DC | PRN
  Administered 2015-12-28: 120 mg via INTRAVENOUS

## 2015-12-28 MED ORDER — HEPARIN SODIUM (PORCINE) 1000 UNIT/ML IJ SOLN
INTRAMUSCULAR | Status: DC | PRN
Start: 2015-12-28 — End: 2015-12-28
  Administered 2015-12-28: 12000 [IU] via INTRAVENOUS
  Administered 2015-12-28: 1000 [IU] via INTRAVENOUS

## 2015-12-28 SURGICAL SUPPLY — 78 items
BAG DECANTER FOR FLEXI CONT (MISCELLANEOUS) IMPLANT
BALLN CODA OCL 2-9.0-35-120-3 (BALLOONS)
BALLOON COD OCL 2-9.0-35-120-3 (BALLOONS) IMPLANT
CANISTER SUCTION 2500CC (MISCELLANEOUS) ×2 IMPLANT
CATH ANGIO 5F BER2 65CM (CATHETERS) ×2 IMPLANT
CATH OMNI FLUSH .035X70CM (CATHETERS) ×2 IMPLANT
CLIP TI MEDIUM 24 (CLIP) IMPLANT
CLIP TI WIDE RED SMALL 24 (CLIP) IMPLANT
COVER MAYO STAND STRL (DRAPES) ×2 IMPLANT
DEVICE CLOSURE PERCLS PRGLD 6F (VASCULAR PRODUCTS) IMPLANT
DEVICE TORQUE 50000 (MISCELLANEOUS) IMPLANT
DRAIN CHANNEL 10F 3/8 F FF (DRAIN) IMPLANT
DRAIN CHANNEL 10M FLAT 3/4 FLT (DRAIN) IMPLANT
DRAPE TABLE COVER HEAVY DUTY (DRAPES) ×2 IMPLANT
DRAPE ZERO GRAVITY STERILE (DRAPES) ×1 IMPLANT
DRYSEAL FLEXSHEATH 12FR 33CM (SHEATH) ×1
DRYSEAL FLEXSHEATH 18FR 33CM (SHEATH) ×1
ELECT CAUTERY BLADE 6.4 (BLADE) ×2 IMPLANT
ELECT REM PT RETURN 9FT ADLT (ELECTROSURGICAL) ×4
ELECTRODE REM PT RTRN 9FT ADLT (ELECTROSURGICAL) ×2 IMPLANT
EVACUATOR 3/16  PVC DRAIN (DRAIN)
EVACUATOR 3/16 PVC DRAIN (DRAIN) IMPLANT
EVACUATOR SILICONE 100CC (DRAIN) IMPLANT
EXCLUDER AORTIC EXTEND 32X4.5 (Vascular Products) ×2 IMPLANT
EXCLUDER TNK LEG 31MX14X13 (Endovascular Graft) IMPLANT
EXCLUDER TRUNK LEG 31MX14X13 (Endovascular Graft) ×2 IMPLANT
EXTENDER ENDOPROSTHESIS 14X7 (Endovascular Graft) ×1 IMPLANT
GLOVE BIO SURGEON STRL SZ 6.5 (GLOVE) ×4 IMPLANT
GLOVE BIOGEL PI IND STRL 6.5 (GLOVE) IMPLANT
GLOVE BIOGEL PI IND STRL 7.5 (GLOVE) ×1 IMPLANT
GLOVE BIOGEL PI INDICATOR 6.5 (GLOVE) ×3
GLOVE BIOGEL PI INDICATOR 7.5 (GLOVE) ×1
GLOVE SURG SS PI 6.5 STRL IVOR (GLOVE) ×1 IMPLANT
GLOVE SURG SS PI 7.5 STRL IVOR (GLOVE) ×4 IMPLANT
GOWN STRL REUS W/ TWL LRG LVL3 (GOWN DISPOSABLE) ×2 IMPLANT
GOWN STRL REUS W/ TWL XL LVL3 (GOWN DISPOSABLE) ×1 IMPLANT
GOWN STRL REUS W/TWL LRG LVL3 (GOWN DISPOSABLE) ×4
GOWN STRL REUS W/TWL XL LVL3 (GOWN DISPOSABLE) ×2
GRAFT BALLN CATH 65CM (STENTS) ×1 IMPLANT
HEMOSTAT SNOW SURGICEL 2X4 (HEMOSTASIS) IMPLANT
INSERT FOGARTY 61MM (MISCELLANEOUS) IMPLANT
INSERT FOGARTY SM (MISCELLANEOUS) IMPLANT
KIT BASIN OR (CUSTOM PROCEDURE TRAY) ×2 IMPLANT
KIT ROOM TURNOVER OR (KITS) ×2 IMPLANT
LEG CONTRALATERAL 16X20X13.5 (Vascular Products) ×2 IMPLANT
LEG CONTRALATERAL 27X10 (Vascular Products) ×1 IMPLANT
LIQUID BAND (GAUZE/BANDAGES/DRESSINGS) ×2 IMPLANT
NEEDLE PERC 18GX7CM (NEEDLE) ×2 IMPLANT
NS IRRIG 1000ML POUR BTL (IV SOLUTION) ×2 IMPLANT
PACK ENDOVASCULAR (PACKS) ×2 IMPLANT
PAD ARMBOARD 7.5X6 YLW CONV (MISCELLANEOUS) ×4 IMPLANT
PENCIL BUTTON HOLSTER BLD 10FT (ELECTRODE) ×1 IMPLANT
PERCLOSE PROGLIDE 6F (VASCULAR PRODUCTS) ×10
SHEATH AVANTI 11CM 8FR (MISCELLANEOUS) ×1 IMPLANT
SHEATH DRYSEAL FLEX 12FR 33CM (SHEATH) IMPLANT
SHEATH DRYSEAL FLEX 18FR 33CM (SHEATH) IMPLANT
SHEATH INTROD 8 FR 25CM (MISCELLANEOUS) ×2 IMPLANT
SHIELD RADPAD SCOOP 12X17 (MISCELLANEOUS) ×4 IMPLANT
STENT GRAFT BALLN CATH 65CM (STENTS) ×1
STENT GRAFT CONTRALAT 20X13.5 (Vascular Products) IMPLANT
STOPCOCK MORSE 400PSI 3WAY (MISCELLANEOUS) ×2 IMPLANT
SUT ETHILON 3 0 PS 1 (SUTURE) IMPLANT
SUT PROLENE 5 0 C 1 24 (SUTURE) IMPLANT
SUT VIC AB 2-0 CT1 27 (SUTURE)
SUT VIC AB 2-0 CT1 TAPERPNT 27 (SUTURE) IMPLANT
SUT VIC AB 3-0 SH 27 (SUTURE)
SUT VIC AB 3-0 SH 27X BRD (SUTURE) IMPLANT
SUT VICRYL 4-0 PS2 18IN ABS (SUTURE) ×4 IMPLANT
SYR 20CC LL (SYRINGE) ×4 IMPLANT
SYR 30ML LL (SYRINGE) IMPLANT
SYR 5ML LL (SYRINGE) IMPLANT
SYR MEDRAD MARK V 150ML (SYRINGE) IMPLANT
SYRINGE 10CC LL (SYRINGE) ×6 IMPLANT
TRAY FOLEY W/METER SILVER 16FR (SET/KITS/TRAYS/PACK) ×2 IMPLANT
TUBING HIGH PRESSURE 120CM (CONNECTOR) ×1 IMPLANT
WATER STERILE IRR 1000ML POUR (IV SOLUTION) ×2 IMPLANT
WIRE AMPLATZ SS-J .035X180CM (WIRE) ×2 IMPLANT
WIRE BENTSON .035X145CM (WIRE) ×2 IMPLANT

## 2015-12-28 NOTE — Transfer of Care (Signed)
Immediate Anesthesia Transfer of Care Note  Patient: Ryan Wise  Procedure(s) Performed: Procedure(s): ENDOVASCULAR STENT GRAFT INSERTION (N/A)  Patient Location: PACU  Anesthesia Type:General  Level of Consciousness: awake and alert   Airway & Oxygen Therapy: Patient Spontanous Breathing and Patient connected to nasal cannula oxygen  Post-op Assessment: Report given to RN, Post -op Vital signs reviewed and stable and Patient moving all extremities X 4  Post vital signs: Reviewed and stable  Last Vitals:  Vitals:   12/27/15 2254 12/28/15 0516  BP: 121/74 126/85  Pulse: 94 72  Resp: 20 18  Temp: 36.8 C 37 C    Last Pain:  Vitals:   12/28/15 0516  TempSrc: Oral  PainSc:          Complications: No apparent anesthesia complications

## 2015-12-28 NOTE — Anesthesia Preprocedure Evaluation (Addendum)
Anesthesia Evaluation  Patient identified by MRN, date of birth, ID band Patient awake    Reviewed: Allergy & Precautions, NPO status , Patient's Chart, lab work & pertinent test results  Airway Mallampati: III  TM Distance: >3 FB Neck ROM: Full    Dental  (+) Edentulous Upper, Poor Dentition,    Pulmonary shortness of breath, former smoker,    breath sounds clear to auscultation       Cardiovascular hypertension, + CAD, + Past MI, + Cardiac Stents, + CABG, + Peripheral Vascular Disease, +CHF and + DOE   Rhythm:Regular Rate:Normal     Neuro/Psych    GI/Hepatic GERD  ,  Endo/Other    Renal/GU Renal disease     Musculoskeletal   Abdominal (+) + obese,   Peds  Hematology negative hematology ROS (+)   Anesthesia Other Findings   Reproductive/Obstetrics                           Anesthesia Physical Anesthesia Plan  ASA: III  Anesthesia Plan: General   Post-op Pain Management:    Induction: Intravenous  Airway Management Planned: Oral ETT  Additional Equipment: Arterial line  Intra-op Plan:   Post-operative Plan: Extubation in OR  Informed Consent: I have reviewed the patients History and Physical, chart, labs and discussed the procedure including the risks, benefits and alternatives for the proposed anesthesia with the patient or authorized representative who has indicated his/her understanding and acceptance.   Dental advisory given  Plan Discussed with: CRNA  Anesthesia Plan Comments:         Anesthesia Quick Evaluation

## 2015-12-28 NOTE — Progress Notes (Signed)
Requested antibiotic from pharmacy for surgery, awaiting for it to come up

## 2015-12-28 NOTE — OR Nursing (Signed)
Attempted to call report.  Maggie RN was "in a room" and unable to take report.  Will call back.

## 2015-12-28 NOTE — Anesthesia Postprocedure Evaluation (Signed)
Anesthesia Post Note  Patient: Ryan Wise  Procedure(s) Performed: Procedure(s) (LRB): ENDOVASCULAR STENT GRAFT INSERTION (N/A)  Patient location during evaluation: PACU Anesthesia Type: General Level of consciousness: awake and alert Pain management: pain level controlled Vital Signs Assessment: post-procedure vital signs reviewed and stable Respiratory status: spontaneous breathing, nonlabored ventilation, respiratory function stable and patient connected to nasal cannula oxygen Cardiovascular status: blood pressure returned to baseline and stable Postop Assessment: no signs of nausea or vomiting Anesthetic complications: no    Last Vitals:  Vitals:   12/28/15 1545 12/28/15 1631  BP:  118/73  Pulse: (!) 49 (!) 52  Resp: 17 17  Temp: 36.8 C     Last Pain:  Vitals:   12/28/15 0516  TempSrc: Oral  PainSc:                  Ki Corbo,JAMES TERRILL

## 2015-12-28 NOTE — Progress Notes (Signed)
Patient seen and evaluated earlier this a.m. by my associate. Vascular surgery consulted and plan is to take patient to OR today.  Patient has no new complaints. Gen.: Patient in no acute distress Cardiovascular: No cyanosis Pulmonary: No increased work of breathing, equal chest rise  Vital signs reviewed in this mornings vitals and 5 in the morning were stable and within normal limits  We'll reassess next a.m.  Ryan Wise, [employer]

## 2015-12-28 NOTE — Op Note (Addendum)
Patient name: Adalbert Alberto MRN: [id number] DOB: July 08, 1940 Sex: male  12/27/2015 - 12/28/2015 Pre-operative Diagnosis: AAA Post-operative diagnosis:  Same Surgeon:  Durene Cal Assistants:  Doreatha Massed Procedure:   #1: Endovascular repair of an abdominal aortic aneurysm   #2: Proximal extension 1   #3: Distal extension 1   #4: Bilateral ultrasound-guided percutaneous access   #5: Catheter aorta 2   #6:  Abdominal aortogram Anesthesia:  Gen. Blood Loss:  See anesthesia record Specimens:  None  Findings:  Complete exclusion  Devices used: Main body was primary left Gore 31 x 14 x 13 ipsilateral left was Gore 27 x 10.  Contralateral right had a bridge 16 x 14.5 x 7 followed by a 20 x 13.5.  Proximal cuff was a 32 x 4.5  Indications:  The patient presented to the emergency department last night with complaints of abdominal pain nausea and vomiting.  He underwent a CT scan which showed an 8.7 cm infrarenal abdominal aortic aneurysm.  His pain nausea vomiting and diarrhea have resolved.  Due to concerns for rupture given the size of his aneurysm we have elected to proceed with endovascular repair.  The risks and benefits were discussed extensively with his family and they're willing to proceed.  Procedure:  The patient was identified in the holding area and taken to [facility] OR ROOM 16  The patient was then placed supine on the table. general anesthesia was administered.  The patient was prepped and draped in the usual sterile fashion.  A time out was called and antibiotics were administered.  Ultrasound was used to evaluate bilateral common femoral arteries which are widely patent and easily compressible.  A #11 blade was used to make a skin nick bilaterally.  Bilateral common femoral arteries were then cannulated under ultrasound guidance with an 18-gauge needle.  A 035 wire was advanced without resistance.  A 8 French dilator was used to dilate the subcutaneous tract.  8 French sheaths were  placed bilaterally.  The patient was fully heparinized.  A Amplatz superstiff wire was placed up the left side.  The 18 French sheath was then inserted.  A pigtail catheter was advanced up the right and positioned at the level of L-1.  The main body device was prepared on the back table.  This was a Gore 31 x 14 x 13 device.  It was advanced up the left side.  Arteriogram was then performed locating the renal arteries.  The graft was then deployed down to the contralateral gate.  A follow-up arteriogram confirmed that the device was in good position.  A Amplatz superstiff wire was then advanced through the Omni flush catheter.  A 12 French sheath was then advanced over the wire.  Using a Berenstein 2 catheter and a Benson wire, the contralateral gate was cannulated.  The Omni flush catheter was then reloaded and able to be freely rotated within the main body, confirming successful cannulation.  An Amplatz superstiff wire was then placed.  Next, the image detector was rotated to a left anterior oblique position.  A retrograde injection through the sheath was performed locating the right hypogastric artery.  A Gore 16 x 14.5 x 7 bridging piece was inserted and deployed.  Next the contralateral extension, a 20 x 13.5 device was loaded and deployed landing at the level of the right hypogastric artery.  Next, the remaining portion of the ipsilateral graft was deployed.  The image detector was rotated to a right anterior oblique position  and a retrograde injection through the sheath was performed locating the right hypogastric artery.  The ipsilateral extension which was a Gore 27 x 10 device was then prepared on the back table and advanced through the sheath and positioned properly.  It was deployed at the level of the right hypogastric artery.  It did retract slightly into the common iliac artery on the right.  Next, a Q-50 balloon was used to mold device overlap as well as the proximal and distal attachment sites.  A  completion arteriogram was performed.  This showed no evidence of endoleak and complete exclusion of the aneurysm sac.  I did feel that with balloon molding of the graft, the proximal edge had slipped down a few millimeters.  It was also an area where on the left lateral side it was not attached to the aortic wall.  I therefore elected to place a cuff.  Up the left side, a 32 x 4.5 cuff was prepared and then position.  A follow-up arteriogram located the lower right renal artery and the cuff was deployed at this level.  The Q-50 balloon was then used to conform the cuff.  A final arteriogram was performed which showed excellent positioning of the cuff.  There was no evidence of a type IA endoleak.  The left lateral wall was still slightly detached from the aortic wall at the very top of the graft, but this was much improved.  The stiff wires were exchanged out for [facility] wires.  The sheaths were then withdrawn and the arteriotomy sites closed by securing the previously placed probe glide devices.  50 mg of protamine was given.  Cautery was used to contract the incisions and then Dermabond was placed.  There were no immediate, complications   Disposition:  To PACU in stable condition.   Juleen China, M.D. [facility] Office: [phone] Pager:  [phone]

## 2015-12-28 NOTE — H&P (Signed)
History and Physical    Ryan Wise ZOX:[id number] DOB: 03/08/1941 DOA: 12/27/2015  PCP: Thayer Headings, MD  CARDIOLOGY: DR. HILTY  Patient coming from: Home  Chief Complaint: Nausea, vomiting, diarrhea, abdominal pain  HPI: Ryan Wise is a 75 y.o. gentleman with a history of CAD S/P CABG, ischemic cardiomyopathy, HTN, and GERD who presented to the ED for evaluation of nausea, vomiting, diarrhea, and abdominal pain, acute onset within 24 hours prior to presentation.  The patient denies any known sick contacts or relatives with similar symptoms.  No documented fever prior to arrival but he has had chills at home.  He reports "at least a dozen" loose stools "like water" prior to arrival.  No BRBPR.  He vomited nonbloody emesis 6-7 times.  He reports the he "felt himself becoming dehydrated", primarily with extremely dry mouth, which prompted presentation to the ED.  He did not take any OTC medications in an attempt to manage or alleviate any of his symptoms.  ED Course: CT of the abdomen and pelvis showed changes at the small bowel concerning for ileus or early partial SBO.  General surgery was consulted and continued observation was recommended for now.  IV fluids given cautiously due to history of CHF (echo in 06/2014 showed EF 45-50% with grade I diastolic dysfunction).  CT A/P also showed a 9cm infrarenal AAA, which prompted vascular surgery consultation.  Dr. Myra Gianotti has seen in the patient in the ED and is considering surgical intervention in the AM.  Hospitalist asked to admit.    Review of Systems: No known history of diverticular disease or prior episodes of colitis, though he has had viral gastroenteritis in the past.  No chest pain or shortness of breath.  No unexplained swelling.  He checks his weight almost daily, and it fluctuates within 5 lbs.  No history to suggest unstable angina or decompensated heart failure.  Otherwise, 10 systems reviewed and negative except as stated in the  HPI.   Past Medical History:  Diagnosis Date  . Abnormal echocardiogram 05/31/12   EF = 45-50% with more global hypokinesis. There was eccentric hypertrophy of the ventricle. There was a trace AI and mild MR.  Marland Kitchen Acute renal insufficiency   . Arthritis   . Cardiomyopathy, ischemic, EF by cath 35% and Echo 35-40% with STEMI and PCI 12/21/2012  . CHF (congestive heart failure) (HCC)    Acute combined systolic and diastolic heart failure  . Coronary artery disease    CABG in 1999 which was a 5-vessel bypass and had a questionable history of A fib. He underwent monitoring whick showed sinus bradycardia and PACs but no evidence of A fib.  Marland Kitchen GERD (gastroesophageal reflux disease)   . High triglycerides   . Hypertension   . Obesity    unspecified  . Ruptured lumbar disc   . ST elevation myocardial infarction (STEMI) of inferior wall (HCC) 12/18/12   STEMI of inf. wall w/PCI with Xperdition stent to VG to distal RCA    Past Surgical History:  Procedure Laterality Date  . BACK SURGERY     (5) back surgeries  . CORONARY ARTERY BYPASS GRAFT  1999   (CAD) CABG was a 5-vessel bypass and had a questionable history of A fib. He underwent monitoring whick showed sinus bradycardia and PACs but no evidence of A fib.  Marland Kitchen LEFT HEART CATHETERIZATION WITH CORONARY/GRAFT ANGIOGRAM  12/18/2012   Procedure: LEFT HEART CATHETERIZATION WITH Isabel Caprice;  Surgeon: [name], MD;  Location: [facility] CATH  LAB;  Service: Cardiovascular;;  . LUMBAR DISC SURGERY    . open heart surgery  03/1998  . TONSILLECTOMY       reports that he quit smoking about 28 years ago. He has a 135.00 pack-year smoking history. He has quit using smokeless tobacco. His smokeless tobacco use included Chew. He reports that he does not drink alcohol or use drugs.  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  He is not married.  He has one adult daughter, [name], who is his next of kin.  Allergies    Allergen Reactions  . Aspirin Nausea And Vomiting    Can't take full strength uncoated aspirin  . Other Other (See Comments)    Pain medication that he was given after open heart surgery-sweating and hallucinations     Family History  Problem Relation Age of Onset  . CAD Mother   . Heart disease Mother   . CAD Brother   . Heart attack Brother   . Hyperlipidemia Sister   . Leukemia Father   . CAD Maternal Grandmother   . Brain cancer Brother   . CAD Sister      Prior to Admission medications   Medication Sig Start Date End Date Taking? Authorizing Provider  acetaminophen (TYLENOL) 325 MG tablet Take 2 tablets (650 mg total) by mouth every 4 (four) hours as needed. Patient taking differently: Take 650 mg by mouth every 4 (four) hours as needed for mild pain.  12/22/12  Yes Luke K Kilroy, PA-C  albuterol (PROVENTIL HFA;VENTOLIN HFA) 108 (90 Base) MCG/ACT inhaler Inhale 1-2 puffs into the lungs every 6 (six) hours as needed for wheezing or shortness of breath.   Yes Historical Provider, MD  aspirin EC 81 MG EC tablet Take 1 tablet (81 mg total) by mouth daily. 12/22/12  Yes Luke K Kilroy, PA-C  atorvastatin (LIPITOR) 80 MG tablet TAKE 1 TABLET DAILY AT 6 PM. Patient taking differently: Take 40mg  in the evening. 04/18/15  Yes Chrystie Nose, MD  fluticasone (FLONASE) 50 MCG/ACT nasal spray Place 1 spray into both nostrils daily as needed for allergies or rhinitis.   Yes Historical Provider, MD  furosemide (LASIX) 20 MG tablet Take 1 tablet (20 mg total) by mouth daily. Please schedule appointment for refills. 12/07/15  Yes Chrystie Nose, MD  isosorbide mononitrate (IMDUR) 30 MG 24 hr tablet Take 1 tablet (30 mg total) by mouth daily. Please schedule appointment for refills. 12/07/15  Yes Chrystie Nose, MD  lisinopril (PRINIVIL,ZESTRIL) 5 MG tablet Take 1 tablet (5 mg total) by mouth daily. Please keep your upcoming appointment (10/15/15) for refills. 10/08/15  Yes Chrystie Nose, MD   LORazepam (ATIVAN) 0.5 MG tablet Take 0.5 mg by mouth every 8 (eight) hours as needed for anxiety. 01/07/13  Yes Chrystie Nose, MD  metoprolol tartrate (LOPRESSOR) 25 MG tablet Take 0.5 tablets (12.5 mg total) by mouth 2 (two) times daily. 07/06/13  Yes Chrystie Nose, MD  nitroGLYCERIN (NITROSTAT) 0.4 MG SL tablet Place 1 tablet (0.4 mg total) under the tongue every 5 (five) minutes as needed for chest pain. 12/22/12  Yes Luke K Kilroy, PA-C  ranitidine (ZANTAC) 150 MG tablet Take 150 mg by mouth 2 (two) times daily.   Yes Historical Provider, MD    Physical Exam: Vitals:   12/27/15 2015 12/27/15 2045 12/27/15 2100 12/27/15 2254  BP: 112/70 109/67 116/68 121/74  Pulse: 97   94  Resp:  Temp:    98.2 F (36.8 C)  TempSrc:    Oral  SpO2: 94%   97%  Weight:    134.7 kg (297 lb)  Height:     (1.88 m)      Constitutional: NAD, calm, comfortable Vitals:   12/27/15 2015 12/27/15 2045 12/27/15 2100 12/27/15 2254  BP: 112/70 109/67 116/68 121/74  Pulse: 97   94  Resp: Temp:    98.2 F (36.8 C)  TempSrc:    Oral  SpO2: 94%   97%  Weight:    134.7 kg (297 lb)  Height:     (1.88 m)   Eyes: PERRL, lids and conjunctivae normal ENMT: Mucous membranes are slightly dry. Posterior pharynx clear of any exudate or lesions. Normal dentition.  Neck: normal appearance, supple Respiratory: clear to auscultation bilaterally, no wheezing, no crackles. Normal respiratory effort. No accessory muscle use.  Cardiovascular: Normal rate, regular rhythm, no murmurs / rubs / gallops. No extremity edema. 2+ pedal pulses.  GI: abdomen is protuberant but soft and compressible.  No tenderness.  No masses palpated.  Bowel sounds are present. Musculoskeletal:  No joint deformity in upper and lower extremities. Good ROM, no contractures. Normal muscle tone.  Skin: no rashes, warm and dry, tan lines noted Neurologic: No focal deficits Psychiatric: Normal judgment and insight.  Alert and oriented x 3. Normal mood.     Labs on Admission: I have personally reviewed following labs and imaging studies  CBC:  Recent Labs Lab 12/27/15 1444 12/28/15 0202  WBC 7.3 3.8*  HGB 14.7 12.7*  HCT 45.9 40.9  MCV 89.0 90.1  PLT 207 160   Basic Metabolic Panel:  Recent Labs Lab 12/27/15 1444 12/28/15 0202  NA 140 137  K 3.6 3.3*  CL 106 104  CO2 23 26  GLUCOSE 152* 119*  BUN 17 18  CREATININE 1.12 1.06  CALCIUM 9.0 7.8*   GFR: Estimated Creatinine Clearance: 89.2 mL/min (by C-G formula based on SCr of 1.06 mg/dL). Liver Function Tests:  Recent Labs Lab 12/27/15 1444  AST 23  ALT 18  ALKPHOS 64  BILITOT 2.1*  PROT 6.9  ALBUMIN 3.9    Recent Labs Lab 12/27/15 1444  LIPASE 15   Coagulation Profile:  Recent Labs Lab 12/28/15 0202  INR 1.15   Urine analysis:    Component Value Date/Time   COLORURINE YELLOW 12/27/2015 2338   APPEARANCEUR CLEAR 12/27/2015 2338   LABSPEC 1.030 12/27/2015 2338   PHURINE 5.5 12/27/2015 2338   GLUCOSEU NEGATIVE 12/27/2015 2338   HGBUR NEGATIVE 12/27/2015 2338   BILIRUBINUR NEGATIVE 12/27/2015 2338   KETONESUR NEGATIVE 12/27/2015 2338   PROTEINUR 30 (A) 12/27/2015 2338   NITRITE NEGATIVE 12/27/2015 2338   LEUKOCYTESUR NEGATIVE 12/27/2015 2338   Sepsis Labs:  Lactic acid 1.93  Radiological Exams on Admission: Ct Abdomen Pelvis W Contrast  Result Date: 12/27/2015 CLINICAL DATA:  Nonfocal abdominal pain, nausea, vomiting and diarrhea since last night. EXAM: CT ABDOMEN AND PELVIS WITH CONTRAST TECHNIQUE: Multidetector CT imaging of the abdomen and pelvis was performed using the standard protocol following bolus administration of intravenous contrast. CONTRAST:  1 ISOVUE-300 IOPAMIDOL (ISOVUE-300) INJECTION 61% COMPARISON:  Abdomen ultrasound dated 08/27/2014. FINDINGS: Lower chest:  Bibasilar atelectasis.  No pleural fluid. Hepatobiliary: Mild diffuse low density of the liver relative to the spleen. Small liver  calcified granuloma. Normal appearing gallbladder. Pancreas: No mass, inflammatory changes, or other significant abnormality.  Spleen: Within normal limits in size and appearance. Adrenals/Urinary Tract: Normal appearing adrenal glands, kidneys, ureters and urinary bladder. No urinary tract calculi or hydronephrosis. Stomach/Bowel: Multiple mildly dilated proximal and mid small bowel loops. Normal caliber distal small bowel and colon. A discrete point of transition is not identified. No obstructing mass is seen. Normal appearing appendix. Vascular/Lymphatic: There is a large infrarenal abdominal aortic aneurysm. This has a smaller proximal component and larger distal component, together measuring 11.3 cm in length on sagittal image number 100. In the axial plane, this measures 8.7 cm in maximum diameter. There is a large amount of eccentric mural thrombus anteriorly. Also demonstrated is mild aneurysmal dilatation of the proximal common iliac arteries. The common iliac artery on the left has a transverse diameter of 2.5 cm on image number 71 of series 201 and the common iliac artery on the right has a maximum AP diameter of 2.6 cm on image number 69 of series 201. There are atheromatous arterial calcifications, including the abdominal aorta. No enlarged lymph nodes are seen. Reproductive: Normal sized prostate gland containing a small amount of coarse calcification. Other: Small bilateral inguinal hernias containing fat. Musculoskeletal: Lumbar and lower thoracic spine degenerative changes and mild scoliosis. IMPRESSION: 1. Mild proximal and mid small bowel ileus or partial obstruction. 2. Large infrarenal abdominal aortic aneurysm with a maximum diameter of 8.7 cm. Vascular surgery consultation recommended due to increased risk of rupture for AAA >5.5 cm. This recommendation follows ACR consensus guidelines: White Paper of the ACR Incidental Findings Committee II on Vascular Findings. J Am Coll Radiol 2013;  10:789-794. 3. 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 4. Aortic atherosclerosis. 5. Mild diffuse hepatic steatosis. 6. Bibasilar atelectasis. 7. Small bilateral inguinal hernias containing fat. Electronically Signed   By: Beckie Salts M.D.   On: 12/27/2015 19:20    EKG: Independently reviewed. NSR. Q waves in inferior leads.  Appears stable compared to prior.  Assessment/Plan Principal Problem:   AAA (abdominal aortic aneurysm) without rupture (HCC) Active Problems:   CAD, CABG '99   Acute gastroenteritis   Chronic combined systolic and diastolic congestive heart failure (HCC)      Nausea, vomiting, and abdominal pain likely related to self limited viral gastroenteritis --Patient is S/P 1.5L NS total.  Monitor volume status due to history of CHF.  No known history of CKD. --GI pathogens panel and C diff PCR if we can get a stool sample --Supportive care  AAA --Vascular surgery consult noted.  Anticipate OR in the AM.  NPO after midnight.  Chronic combined systolic and diastolic heart failure --Compensated --Defer lasix dosing until after surgery due to recent volume losses --Continue baby aspirin, ACE-I, BB, Imdur for now  GERD --H2 blocker BID  DVT prophylaxis: SCDs  Code Status: FULL Family Communication: Daughter and son-in-law present in the ED at time of admission. Disposition Plan: Expect he will go home when stable Consults called: General surgery, Vascular surgery Admission status: Observation, telemetry,  Expect he will convert to inpatient   TIME SPENT: 70 minutes   Jerene Bears MD [facility] Pager [phone]  If 7PM-7AM, please contact night-coverage [email or web address] Password [email or web address]  12/28/2015, 4:28 AM

## 2015-12-28 NOTE — Anesthesia Procedure Notes (Signed)
Procedure Name: Intubation Date/Time: 12/28/2015 11:24 AM Performed by: Rise Patience T Pre-anesthesia Checklist: Patient identified, Emergency Drugs available, Suction available and Patient being monitored Patient Re-evaluated:Patient Re-evaluated prior to inductionOxygen Delivery Method: Circle System Utilized Preoxygenation: Pre-oxygenation with 100% oxygen Intubation Type: IV induction Ventilation: Mask ventilation without difficulty Laryngoscope Size: Miller and 2 Grade View: Grade I Tube type: Oral Tube size: 7.5 mm Number of attempts: 1 Airway Equipment and Method: Stylet and Oral airway Placement Confirmation: ETT inserted through vocal cords under direct vision,  positive ETCO2 and breath sounds checked- equal and bilateral Secured at: 23 cm Tube secured with: Tape Dental Injury: Teeth and Oropharynx as per pre-operative assessment

## 2015-12-28 NOTE — Progress Notes (Signed)
Patient having surgery today for AAA endovascular repair.  No bowel issues.  Should be able to take off enteric precautions.  We will sign off.  Marta Lamas. Gae Bon, MD, FACS [phone] [facility]

## 2015-12-29 LAB — CBC
HCT: 37.6 % — ABNORMAL LOW (ref 39.0–52.0)
HEMOGLOBIN: 11.4 g/dL — AB (ref 13.0–17.0)
MCH: 27.7 pg (ref 26.0–34.0)
MCHC: 30.3 g/dL (ref 30.0–36.0)
MCV: 91.3 fL (ref 78.0–100.0)
Platelets: 128 10*3/uL — ABNORMAL LOW (ref 150–400)
RBC: 4.12 MIL/uL — AB (ref 4.22–5.81)
RDW: 14.8 % (ref 11.5–15.5)
WBC: 5.3 10*3/uL (ref 4.0–10.5)

## 2015-12-29 LAB — BASIC METABOLIC PANEL
Anion gap: 7 (ref 5–15)
BUN: 11 mg/dL (ref 6–20)
CO2: 27 mmol/L (ref 22–32)
CREATININE: 0.97 mg/dL (ref 0.61–1.24)
Calcium: 7.6 mg/dL — ABNORMAL LOW (ref 8.9–10.3)
Chloride: 104 mmol/L (ref 101–111)
GFR calc non Af Amer: >60 mL/min (ref >60)
Glucose, Bld: 116 mg/dL — ABNORMAL HIGH (ref 65–99)
POTASSIUM: 3.4 mmol/L — AB (ref 3.5–5.1)
SODIUM: 138 mmol/L (ref 135–145)

## 2015-12-29 MED ORDER — OXYCODONE-ACETAMINOPHEN 5-325 MG PO TABS: 1.0000 | ORAL_TABLET | Freq: Four times a day (QID) | ORAL | 0 refills | Status: DC | PRN

## 2015-12-29 NOTE — Discharge Summary (Signed)
Physician Discharge Summary  Shine Mikes ZOX:[id number] DOB: [birth year] DOA: 12/27/2015  PCP: Thayer Headings, MD  Admit date: 12/27/2015 Discharge date: 12/29/2015  Time spent: > 35 minutes  Recommendations for Outpatient Follow-up:  1. Ensure patient f/u with vascular surgery   Discharge Diagnoses:  Principal Problem:   AAA (abdominal aortic aneurysm) without rupture [facility]) Active Problems:   CAD, CABG '99   Acute gastroenteritis   Chronic combined systolic and diastolic congestive heart failure (HCC)   AAA (abdominal aortic aneurysm) [facility])   Discharge Condition: stable  Diet recommendation: heart healthy  Filed Weights   12/28/15 0516 12/28/15 1631 12/29/15 0355  Weight: 132.4 kg (291 lb 14.4 oz) 132.8 kg (292 lb 12.3 oz) 135.5 kg (298 lb 11.6 oz)    History of present illness:  75 y.o. gentleman with a history of CAD S/P CABG, ischemic cardiomyopathy, HTN, and GERD who presented to the ED for evaluation of nausea, vomiting, diarrhea, and abdominal pain, acute onset within 24 hours prior to presentation.  The patient denies any known sick contacts or relatives with similar symptoms.  No documented fever prior to arrival but he has had chills at home.  He reports "at least a dozen" loose stools "like water" prior to arrival.  No BRBPR.  He vomited nonbloody emesis 6-7 times.  He reports the he "felt himself becoming dehydrated", primarily with extremely dry mouth, which prompted presentation to the ED  CT A/P also showed a 9cm infrarenal AAA, which prompted vascular surgery consultation.  Dr. Myra Gianotti has seen in the patient in the ED   Hospital Course:  Viral gastroenteritis - resolved with supportive therapy  AAA - Vascular surgeon seen patient and operated on patient. Please refer to their notes for details.  Procedures:  As per vascular surgery notes please refer for details  Consultations:  Vascular surgery: Dr. Myra Gianotti  Discharge Exam: Vitals:   12/29/15 0400  12/29/15 0803  BP: (!) 129/58 140/78  Pulse: 69 76  Resp: 17 19  Temp:  98.8 F (37.1 C)    General: Pt in nad, alert and awake Cardiovascular: rrr, no rubs Respiratory: no increased wob, no wheezes  Discharge Instructions   Discharge Instructions    ABDOMINAL PROCEDURE/ANEURYSM REPAIR/AORTO-BIFEMORAL BYPASS:  Call MD for increased abdominal pain; cramping diarrhea; nausea/vomiting    Complete by:  As directed   Call MD for:  extreme fatigue    Complete by:  As directed   Call MD for:  redness, tenderness, or signs of infection (pain, swelling, bleeding, redness, odor or green/yellow discharge around incision site)    Complete by:  As directed   Call MD for:  severe or increased pain, loss or decreased feeling  in affected limb(s)    Complete by:  As directed   Call MD for:  severe uncontrolled pain    Complete by:  As directed   Call MD for:  temperature >100.4    Complete by:  As directed   Call MD for:  temperature >100.5    Complete by:  As directed   Diet - low sodium heart healthy    Complete by:  As directed   Discharge instructions    Complete by:  As directed   Please follow up with your vascular surgeon as per their recommendations. Please call their office after hospital discharge to confirm appointment date and time.   Discharge wound care:    Complete by:  As directed   Shower daily with soap and water starting 12/30/15  Driving Restrictions    Complete by:  As directed   No driving for 2 weeks   Increase activity slowly    Complete by:  As directed   Lifting restrictions    Complete by:  As directed   No lifting for 2 weeks   Resume previous diet    Complete by:  As directed     Current Discharge Medication List    START taking these medications   Details  oxyCODONE-acetaminophen (PERCOCET/ROXICET) 5-325 MG tablet Take 1 tablet by mouth every 6 (six) hours as needed for moderate pain. Qty: 4 tablet, Refills: 0      CONTINUE these medications which have NOT  CHANGED   Details  acetaminophen (TYLENOL) 325 MG tablet Take 2 tablets (650 mg total) by mouth every 4 (four) hours as needed.    aspirin EC 81 MG EC tablet Take 1 tablet (81 mg total) by mouth daily.    atorvastatin (LIPITOR) 80 MG tablet TAKE 1 TABLET DAILY AT 6 PM. Qty: 30 tablet, Refills: 11    fluticasone (FLONASE) 50 MCG/ACT nasal spray Place 1 spray into both nostrils daily as needed for allergies or rhinitis.    furosemide (LASIX) 20 MG tablet Take 1 tablet (20 mg total) by mouth daily. Please schedule appointment for refills. Qty: 15 tablet, Refills: 0    isosorbide mononitrate (IMDUR) 30 MG 24 hr tablet Take 1 tablet (30 mg total) by mouth daily. Please schedule appointment for refills. Qty: 15 tablet, Refills: 0    lisinopril (PRINIVIL,ZESTRIL) 5 MG tablet Take 1 tablet (5 mg total) by mouth daily. Please keep your upcoming appointment (10/15/15) for refills. Qty: 30 tablet, Refills: 1    LORazepam (ATIVAN) 0.5 MG tablet Take 0.5 mg by mouth every 8 (eight) hours as needed for anxiety.    metoprolol tartrate (LOPRESSOR) 25 MG tablet Take 0.5 tablets (12.5 mg total) by mouth 2 (two) times daily. Qty: 30 tablet, Refills: 11    nitroGLYCERIN (NITROSTAT) 0.4 MG SL tablet Place 1 tablet (0.4 mg total) under the tongue every 5 (five) minutes as needed for chest pain. Qty: 25 tablet, Refills: 3    ranitidine (ZANTAC) 150 MG tablet Take 150 mg by mouth 2 (two) times daily.      STOP taking these medications     albuterol (PROVENTIL HFA;VENTOLIN HFA) 108 (90 Base) MCG/ACT inhaler        Allergies  Allergen Reactions  . Aspirin Nausea And Vomiting    Can't take full strength uncoated aspirin  . Other Other (See Comments)    Pain medication that he was given after open heart surgery-sweating and hallucinations    Follow-up Information    Durene Cal, MD In 4 weeks.   Specialties:  Vascular Surgery, Cardiology Why:  Office will call you to arrange your appt  (sent) Contact information: [address] [place] [address] [phone]            The results of significant diagnostics from this hospitalization (including imaging, microbiology, ancillary and laboratory) are listed below for reference.    Significant Diagnostic Studies: Ct Abdomen Pelvis W Contrast  Result Date: 12/27/2015 CLINICAL DATA:  Nonfocal abdominal pain, nausea, vomiting and diarrhea since last night. EXAM: CT ABDOMEN AND PELVIS WITH CONTRAST TECHNIQUE: Multidetector CT imaging of the abdomen and pelvis was performed using the standard protocol following bolus administration of intravenous contrast. CONTRAST:  1 ISOVUE-300 IOPAMIDOL (ISOVUE-300) INJECTION 61% COMPARISON:  Abdomen ultrasound dated 08/27/2014. FINDINGS: Lower chest:  Bibasilar  atelectasis.  No pleural fluid. Hepatobiliary: Mild diffuse low density of the liver relative to the spleen. Small liver calcified granuloma. Normal appearing gallbladder. Pancreas: No mass, inflammatory changes, or other significant abnormality. Spleen: Within normal limits in size and appearance. Adrenals/Urinary Tract: Normal appearing adrenal glands, kidneys, ureters and urinary bladder. No urinary tract calculi or hydronephrosis. Stomach/Bowel: Multiple mildly dilated proximal and mid small bowel loops. Normal caliber distal small bowel and colon. A discrete point of transition is not identified. No obstructing mass is seen. Normal appearing appendix. Vascular/Lymphatic: There is a large infrarenal abdominal aortic aneurysm. This has a smaller proximal component and larger distal component, together measuring 11.3 cm in length on sagittal image number 100. In the axial plane, this measures 8.7 cm in maximum diameter. There is a large amount of eccentric mural thrombus anteriorly. Also demonstrated is mild aneurysmal dilatation of the proximal common iliac arteries. The common iliac artery on the left has a transverse diameter of 2.5 cm on  image number 71 of series 201 and the common iliac artery on the right has a maximum AP diameter of 2.6 cm on image number 69 of series 201. There are atheromatous arterial calcifications, including the abdominal aorta. No enlarged lymph nodes are seen. Reproductive: Normal sized prostate gland containing a small amount of coarse calcification. Other: Small bilateral inguinal hernias containing fat. Musculoskeletal: Lumbar and lower thoracic spine degenerative changes and mild scoliosis. IMPRESSION: 1. Mild proximal and mid small bowel ileus or partial obstruction. 2. Large infrarenal abdominal aortic aneurysm with a maximum diameter of 8.7 cm. Vascular surgery consultation recommended due to increased risk of rupture for AAA >5.5 cm. This recommendation follows ACR consensus guidelines: White Paper of the ACR Incidental Findings Committee II on Vascular Findings. J Am Coll Radiol 2013; 10:789-794. 3. 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 4. Aortic atherosclerosis. 5. Mild diffuse hepatic steatosis. 6. Bibasilar atelectasis. 7. Small bilateral inguinal hernias containing fat. Electronically Signed   By: Beckie Salts M.D.   On: 12/27/2015 19:20   Dg Chest Port 1 View  Result Date: 12/28/2015 CLINICAL DATA:  Postop day 0 endovascular repair of abdominal aortic aneurysm. EXAM: PORTABLE CHEST 1 VIEW COMPARISON:  07/10/2015, 08/27/2014 and earlier, including CTA chest 08/27/2014. FINDINGS: Markedly suboptimal inspiration with atelectasis in the lung bases. Cardiac silhouette moderately to markedly enlarged, unchanged. Pulmonary venous hypertension without overt edema. No visible pleural effusions. IMPRESSION: 1. Markedly suboptimal inspiration accounts for bibasilar atelectasis. No acute cardiopulmonary disease otherwise. 2. Stable moderate to marked cardiomegaly. Pulmonary venous hypertension without overt edema. Electronically Signed   By: Hulan Saas M.D.   On:  12/28/2015 15:03    Microbiology: Recent Results (from the past 240 hour(s))  MRSA PCR Screening     Status: None   Collection Time: 12/28/15  2:32 AM  Result Value Ref Range Status   MRSA by PCR NEGATIVE NEGATIVE Final    Comment:        The GeneXpert MRSA Assay (FDA approved for NASAL specimens only), is one component of a comprehensive MRSA colonization surveillance program. It is not intended to diagnose MRSA infection nor to guide or monitor treatment for MRSA infections.      Labs: Basic Metabolic Panel:  Recent Labs Lab 12/27/15 1444 12/28/15 0202 12/28/15 1500 12/29/15 0524  NA 140 137 135 138  K 3.6 3.3* 3.4* 3.4*  CL 106 104 103 104  CO2 23 26 26 27   GLUCOSE 152* 119* 119* 116*  BUN 17 18 16 11   CREATININE 1.12 1.06 1.09 0.97  CALCIUM 9.0 7.8* 7.7* 7.6*  MG  --   --  1.6*  --    Liver Function Tests:  Recent Labs Lab 12/27/15 1444  AST 23  ALT 18  ALKPHOS 64  BILITOT 2.1*  PROT 6.9  ALBUMIN 3.9    Recent Labs Lab 12/27/15 1444  LIPASE 15   No results for input(s): AMMONIA in the last 168 hours. CBC:  Recent Labs Lab 12/27/15 1444 12/28/15 0202 12/28/15 1500 12/29/15 0524  WBC 7.3 3.8* 5.5 5.3  HGB 14.7 12.7* 11.8* 11.4*  HCT 45.9 40.9 38.0* 37.6*  MCV 89.0 90.1 90.9 91.3  PLT 207 160 132* 128*   Cardiac Enzymes: No results for input(s): CKTOTAL, CKMB, CKMBINDEX, TROPONINI in the last 168 hours. BNP: BNP (last 3 results) No results for input(s): BNP in the last 8760 hours.  ProBNP (last 3 results) No results for input(s): PROBNP in the last 8760 hours.  CBG: No results for input(s): GLUCAP in the last 168 hours.     Signed:  Penny Pia MD.  [facility] 12/29/2015, 11:19 AM

## 2015-12-29 NOTE — Discharge Instructions (Signed)
Abdominal Aortic Aneurysm Open Repair, Care After Refer to this sheet in the next few weeks. These instructions provide you with information on caring for yourself after your procedure. Your health care provider may also give you more specific instructions. Your treatment has been planned according to current medical practices, but problems sometimes occur. Call your health care provider if you have any problems or questions after your procedure.  WHAT TO EXPECT AFTER THE PROCEDURE  After your procedure, it is typical to have the following sensations:  Pain at the incision site. Pain-relieving medicine will be given to control this.  Increased tiredness. It can take up to 3 months before you resume all your normal activities. HOME CARE INSTRUCTIONS   Get plenty of rest, but move around frequently for short periods or take short walks as directed by your health care provider. Gradually increase the distance you walk.   Keep the incision area clean and dry. Remove or change bandages (dressings) only as directed by your health care provider. You may have skin adhesive strips over the incision area. Do not take the strips off. They will fall off on their own.   Take showers once your health care provider approves. Until then, only take sponge baths. Pat incisions dry. Do not rub incisions with a washcloth or towel. Do not take tub baths or go swimming until your health care provider approves.   Check your incision area every day for increased pain, swelling, redness, or fluid leaking from the incision. These may be signs of an infection.   Only take over-the-counter or prescription medicines as directed by your health care provider.   Limit activities as directed by your health care provider. Avoid strenuous activity and heavy lifting for 6-8 weeks.   Do not drive until your health care provider approves.   Drink enough fluids to keep your urine clear or pale yellow.   Make any  lifestyle changes recommended by your health care provider. This may include:   Quitting smoking.   Managing your blood pressure.   Reducing stress.   Eating healthy foods that are good for your heart.   Getting regular exercise.   Follow up with your health care provider as directed.  SEEK MEDICAL CARE IF:   You have increasing pain.   You have a fever.   You have chills.  You develop redness, swelling, increased pain, or drainage in the incision area.   You notice a bad smell coming from the incision area or dressing.   You notice that the edges of the incision are not staying together after the stitches or staples have been removed.   You have persistent nausea or vomiting.  You develop a rash. SEEK IMMEDIATE MEDICAL CARE IF:   You have dizziness or fainting while standing.   You have difficulty breathing.   This information is not intended to replace advice given to you by your health care provider. Make sure you discuss any questions you have with your health care provider.   Document Released: 11/29/2004 Document Revised: 01/12/2013 Document Reviewed: 10/19/2012 Elsevier Interactive Patient Education 2016 [employer].   Refer to this sheet in the next few weeks. These instructions provide you with information about caring for yourself after your procedure. Your health care provider may also give you more specific instructions. Your treatment has been planned according to current medical practices, but problems sometimes occur. Call your health care provider if you have any problems or questions after your procedure. WHAT TO  EXPECT AFTER THE PROCEDURE After your procedure, it is common to have:  Pain or discomfort at the incision site.  Tiredness (fatigue). HOME CARE INSTRUCTIONS Activity  Get plenty of rest, but move around frequently for short periods or take short walks as told by your health care provider. Gradually increase the  distance that you walk.  Limit your activities as told by your health care provider. Incision Care  Follow instructions from your health care provider about how to take care of your incision. Make sure you:  Wash your hands with soap and water before you change your bandage (dressing). If soap and water are not available, use hand sanitizer.  Change your dressing as told by your health care provider.  Leave stitches (sutures), skin glue, or adhesive strips in place. These skin closures may need to stay in place for 2 weeks or longer. If adhesive strip edges start to loosen and curl up, you may trim the loose edges. Do not remove adhesive strips completely unless your health care provider tells you to do that.  Keep the incision area clean and dry.  Do not take baths, swim, or use a hot tub until your health care provider approves. Ask your health care provider if you can take showers. You may only be allowed to take sponge baths for bathing.  Check your incision area every day for signs of infection. Check for:  More redness, swelling, or pain.  More fluid or blood.  Pus, warmth, or a bad smell. Lifestyle  Do not use tobacco products, including cigarettes, chewing tobacco, or e-cigarettes. If you need help quitting, ask your health care provider.  Make any other lifestyle changes that are recommended by your health care provider. These may include:  Managing your blood pressure.  Reducing stress.  Eating healthy foods that are good for your heart.  Getting regular exercise. General Instructions  Take over-the-counter and prescription medicines only as told by your health care provider.  Keep all follow-up visits as told by your health care provider. This is important. SEEK MEDICAL CARE IF:  You have pain in your abdomen, chest, or back.  You have a fever.  You havemore redness, swelling, or pain at the site of your incision.  You have more fluid or blood coming from  your incision.  You have pus, warmth, or a bad smell coming from your incision. SEEK IMMEDIATE MEDICAL CARE IF:  You have difficulty breathing.  You suddenly have pain in your legs or difficulty moving either of your legs.  You faint or you feel very light-headed.   This information is not intended to replace advice given to you by your health care provider. Make sure you discuss any questions you have with your health care provider.   Document Released: 01/31/2015 Document Reviewed: 10/01/2012 Elsevier Interactive Patient Education [employer]

## 2015-12-29 NOTE — Progress Notes (Addendum)
Progress Note  12/29/2015 7:56 AM 1 Day Post-Op  Subjective:  No complaints  Tm 99.8 HR 60's-70's NSR 120's-150's systolic 95% 2LO2NC  Vitals:   12/29/15 0024 12/29/15 0355  BP: (!) 151/86 (!) 129/58  Pulse: 73 80  Resp: 18 19  Temp: 99.8 F (37.7 C) 99.8 F (37.7 C)    Physical Exam: Cardiac:  regular Lungs:  Non labored Incisions:  Bilateral groins are soft without hematoma Extremities:  Left palpable DP pulse; doppler signals left peroneal/right peroneal/DP Abdomen:  Soft/NT/ND  CBC    Component Value Date/Time   WBC 5.3 12/29/2015 0524   RBC 4.12 (L) 12/29/2015 0524   HGB 11.4 (L) 12/29/2015 0524   HCT 37.6 (L) 12/29/2015 0524   PLT 128 (L) 12/29/2015 0524   MCV 91.3 12/29/2015 0524   MCH 27.7 12/29/2015 0524   MCHC 30.3 12/29/2015 0524   RDW 14.8 12/29/2015 0524   LYMPHSABS 1.1 07/01/2012 0446   MONOABS 0.8 07/01/2012 0446   EOSABS 0.2 07/01/2012 0446   BASOSABS 0.0 07/01/2012 0446    BMET    Component Value Date/Time   NA 138 12/29/2015 0524   K 3.4 (L) 12/29/2015 0524   CL 104 12/29/2015 0524   CO2 27 12/29/2015 0524   GLUCOSE 116 (H) 12/29/2015 0524   BUN 11 12/29/2015 0524   CREATININE 0.97 12/29/2015 0524   CREATININE 1.24 01/07/2013 1435   CALCIUM 7.6 (L) 12/29/2015 0524   GFRNONAA >60 12/29/2015 0524   GFRAA >60 12/29/2015 0524    INR    Component Value Date/Time   INR 1.22 12/28/2015 1500     Intake/Output Summary (Last 24 hours) at 12/29/15 0756 Last data filed at 12/29/15 0358  Gross per 24 hour  Intake           2021.5 ml  Output             2725 ml  Net           -703.5 ml     Assessment:  75 y.o. male is s/p:  #1: Endovascular repair of an abdominal aortic aneurysm #2: Proximal extension 1 #3: Distal extension 1 #4: Bilateral ultrasound-guided percutaneous access #5: Catheter aorta 2 #6:  Abdominal aortogram  1 Day Post-Op  Plan: -pt doing well from vascular standpoint and may discharge home as long as he  has walked and voided -f/u with Dr. Myra Gianotti in 4 weeks with CTA (our office will arrange) -pt has not had any pain medication-will give Rx for percocet 5/325 #4(No Refill), which he most likely won't fill.   Doreatha Massed, PA-C [facility] [phone] 12/29/2015 7:56 AM  - For VQI Registry use -- Post-op:  Time to Extubation: [x ] In OR,  < 12 hrs,  12-24 hrs,  >=24 hrs Vasopressors Req. Post-op: No MI:  No,  Troponin only,  EKG or Clinical New Arrhythmia: No CHF: No ICU Stay: 1 day in stepdown Transfusion: No  If yes, n/a units given  Complications: Resp failure: [x ] none,  Pneumonia,  Ventilator Chg in renal function: [x ] none,  Inc. Cr > 0.5,  Temp. Dialysis,  Permanent dialysis Leg ischemia: [x ] No,  Yes, no Surgery needed,  Yes, Surgery needed,  Amputation Bowel ischemia: [x ] No,  Medical Rx,  Surgical Rx Wound complication: [ x] No,  Superficial separation/infection,  Return to OR Return  to OR: No  Return to OR for bleeding: No Stroke: [x ] None, [ ]  Minor, [ ]  Major  Discharge medications: Statin use:  Yes ASA use:  Yes Plavix use:   Beta blocker use:  Yes  ACEI use:  Yes  I have interviewed the patient and examined the patient. I agree with the findings by the PA. Groin sites look fine.  OK for discharge from our standpoint.   Cari Caraway, MD [phone]

## 2015-12-29 NOTE — Progress Notes (Signed)
Explained and discussed discharge instructions, prescription and follow up appointment given to pt. Pt going home with daugther via w/c with belongings.

## 2015-12-31 ENCOUNTER — Other Ambulatory Visit: Payer: Self-pay

## 2015-12-31 DIAGNOSIS — Z95828 Presence of other vascular implants and grafts: Secondary | ICD-10-CM

## 2015-12-31 DIAGNOSIS — Z48812 Encounter for surgical aftercare following surgery on the circulatory system: Secondary | ICD-10-CM

## 2016-01-01 ENCOUNTER — Encounter (HOSPITAL_COMMUNITY): Payer: Self-pay | Admitting: Surgery

## 2016-01-01 ENCOUNTER — Telehealth: Payer: Self-pay | Admitting: Surgery

## 2016-01-01 LAB — TYPE AND SCREEN
ABO/RH(D): O NEG
ANTIBODY SCREEN: NEGATIVE
UNIT DIVISION: 0
Unit division: 0

## 2016-01-01 NOTE — Telephone Encounter (Addendum)
-----   Message from Phillips Odorarol S Pullins, RN sent at 12/31/2015  4:14 PM EDT ----- Regarding: needs 4 wk f/u with Dr. Myra GianottiBrabham with CTA Abd/ Pelvis   ----- Message ----- From: Barnett Hatteregina Seymour Sent: 12/31/2015   1:54 PM To: [facility] Charge Pool Subject: Kay's log                                        ----- Message ----- From: Dara LordsSamantha J Rhyne, PA-C Sent: 12/28/2015   1:44 PM To: [facility] Charge Pool  S/p EVAR 12/28/15.  F/u with Dr. Myra GianottiBrabham in 4 weeks with CTA protocol.  Thanks, Gennette PacSamantha  Spoke w/ pt's daughter in regards to fu appt w/ [facility] on 01/21/16 at 8:45am. [facility] I will contact his daughter regarding this appt and mail info to patient. Thanks, Drinda ButtsAnnette

## 2016-01-02 ENCOUNTER — Other Ambulatory Visit: Payer: Self-pay | Admitting: Internal Medicine

## 2016-01-04 ENCOUNTER — Other Ambulatory Visit: Payer: Self-pay | Admitting: Internal Medicine

## 2016-01-04 DIAGNOSIS — I129 Hypertensive chronic kidney disease with stage 1 through stage 4 chronic kidney disease, or unspecified chronic kidney disease: Secondary | ICD-10-CM | POA: Diagnosis not present

## 2016-01-04 DIAGNOSIS — Z09 Encounter for follow-up examination after completed treatment for conditions other than malignant neoplasm: Secondary | ICD-10-CM | POA: Diagnosis not present

## 2016-01-04 DIAGNOSIS — I714 Abdominal aortic aneurysm, without rupture: Secondary | ICD-10-CM | POA: Diagnosis not present

## 2016-01-04 DIAGNOSIS — I2510 Atherosclerotic heart disease of native coronary artery without angina pectoris: Secondary | ICD-10-CM | POA: Diagnosis not present

## 2016-01-04 NOTE — Telephone Encounter (Signed)
Pt is overdue for follow up. Please keep your upcoming September appointment for any future refills.

## 2016-01-16 ENCOUNTER — Encounter: Payer: Self-pay | Admitting: Surgery

## 2016-01-17 ENCOUNTER — Ambulatory Visit
Admission: RE | Admit: 2016-01-17 | Discharge: 2016-01-17 | Disposition: A | Discharge status: Home / Self Care | Payer: Medicare Other | Source: Ambulatory Visit | Attending: Surgery | Admitting: Surgery

## 2016-01-17 DIAGNOSIS — Z95828 Presence of other vascular implants and grafts: Secondary | ICD-10-CM

## 2016-01-17 DIAGNOSIS — I714 Abdominal aortic aneurysm, without rupture: Secondary | ICD-10-CM | POA: Diagnosis not present

## 2016-01-17 DIAGNOSIS — Z48812 Encounter for surgical aftercare following surgery on the circulatory system: Secondary | ICD-10-CM

## 2016-01-17 MED ORDER — IOPAMIDOL (ISOVUE-370) INJECTION 76%
75.0000 mL | Freq: Once | INTRAVENOUS | Status: AC | PRN
  Administered 2016-01-17: 75 mL via INTRAVENOUS

## 2016-01-21 ENCOUNTER — Encounter: Payer: Self-pay | Admitting: Surgery

## 2016-01-21 ENCOUNTER — Ambulatory Visit (INDEPENDENT_AMBULATORY_CARE_PROVIDER_SITE_OTHER): Payer: Medicare Other | Admitting: Surgery

## 2016-01-21 VITALS — BP 132/85 | HR 48 | Temp 98.0°F | Resp 16 | Ht 73.0 in | Wt 290.0 lb

## 2016-01-21 DIAGNOSIS — I714 Abdominal aortic aneurysm, without rupture, unspecified: Secondary | ICD-10-CM

## 2016-01-21 NOTE — Progress Notes (Signed)
Patient name: Ryan MoorsVernon Sissel MRN: [id number] DOB: 01/15/1941 Sex: male  REASON FOR VISIT: post-op  HPI: Ryan MoorsVernon Ergle is a 75 y.o. male returns today for follow-up.  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  His postoperative course was uncomplicated.  He has no complaints.  Current Outpatient Prescriptions  Medication Sig Dispense Refill  . acetaminophen (TYLENOL) 325 MG tablet Take 2 tablets (650 mg total) by mouth every 4 (four) hours as needed. (Patient taking differently: Take 650 mg by mouth every 4 (four) hours as needed for mild pain. )    . aspirin EC 81 MG EC tablet Take 1 tablet (81 mg total) by mouth daily.    Marland Kitchen. atorvastatin (LIPITOR) 80 MG tablet TAKE 1 TABLET DAILY AT 6 PM. (Patient taking differently: Take 40mg  in the evening.) 30 tablet 11  . fluticasone (FLONASE) 50 MCG/ACT nasal spray Place 1 spray into both nostrils daily as needed for allergies or rhinitis.    . furosemide (LASIX) 20 MG tablet TAKE 1 TABLET (20 MG TOTAL) BY MOUTH DAILY. PLEASE SCHEDULE APPOINTMENT FOR REFILLS. 30 tablet 0  . isosorbide mononitrate (IMDUR) 30 MG 24 hr tablet TAKE 1 TABLET (30 MG TOTAL) BY MOUTH DAILY. PLEASE SCHEDULE APPOINTMENT FOR REFILLS. 15 tablet 0  . lisinopril (PRINIVIL,ZESTRIL) 5 MG tablet Take 1 tablet (5 mg total) by mouth daily. Please keep your upcoming appointment (10/15/15) for refills. 30 tablet 1  . LORazepam (ATIVAN) 0.5 MG tablet Take 0.5 mg by mouth every 8 (eight) hours as needed for anxiety.    . metoprolol tartrate (LOPRESSOR) 25 MG tablet Take 0.5 tablets (12.5 mg total) by mouth 2 (two) times daily. 30 tablet 11  . nitroGLYCERIN (NITROSTAT) 0.4 MG SL tablet Place 1 tablet (0.4 mg total) under the tongue every 5 (five) minutes as needed for chest pain. 25 tablet 3  .  oxyCODONE-acetaminophen (PERCOCET/ROXICET) 5-325 MG tablet Take 1 tablet by mouth every 6 (six) hours as needed for moderate pain. 4 tablet 0  . ranitidine (ZANTAC) 150 MG tablet Take 150 mg by mouth 2 (two) times daily.     No current facility-administered medications for this visit.     REVIEW OF SYSTEMS:  [X]  denotes positive finding, [ ]  denotes negative finding Cardiac  Comments:  Chest pain or chest pressure:    Shortness of breath upon exertion:    Short of breath when lying flat:    Irregular heart rhythm:    Constitutional    Fever or chills:      PHYSICAL EXAM: Vitals:   01/21/16 0854  BP: 132/85  Pulse: (!) 48  Resp: 16  Temp: 98 F (36.7 C)  TempSrc: Oral  SpO2: 97%  Weight: 290 lb (131.5 kg)  Height: 6\' 1"  (1.854 m)    GENERAL: The patient is a well-nourished male, in no acute distress. The vital signs are documented above. CARDIOVASCULAR: There is a regular rate and rhythm. PULMONARY: There is good air exchange bilaterally without wheezing or rales. Both groin incisions have healed nicely.   I have reviewed his CT scan.  This shows no significant change in the size was aneurysm.  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  Also identified was a small pulmonary nodule.  MEDICAL ISSUES: I discussed the CT scan findings with the patient and his daughter.  He will follow up in 6 months with a CT angiogram to evaluate his type II endoleak.  He will also have a CT scan of the chest per the radiology guidelines for pulmonary nodules.  Durene Cal, MD [facility] [phone] Pager [phone]

## 2016-01-22 ENCOUNTER — Other Ambulatory Visit: Payer: Self-pay | Admitting: Surgery

## 2016-01-22 ENCOUNTER — Other Ambulatory Visit: Payer: Self-pay

## 2016-01-22 ENCOUNTER — Other Ambulatory Visit: Payer: Self-pay | Admitting: Internal Medicine

## 2016-01-22 DIAGNOSIS — I714 Abdominal aortic aneurysm, without rupture, unspecified: Secondary | ICD-10-CM

## 2016-01-22 NOTE — Telephone Encounter (Signed)
Rx request sent to pharmacy.  

## 2016-01-31 ENCOUNTER — Telehealth: Payer: Self-pay | Admitting: Internal Medicine

## 2016-01-31 ENCOUNTER — Encounter: Payer: Self-pay | Admitting: Internal Medicine

## 2016-01-31 ENCOUNTER — Ambulatory Visit (INDEPENDENT_AMBULATORY_CARE_PROVIDER_SITE_OTHER): Payer: Medicare Other | Admitting: Internal Medicine

## 2016-01-31 VITALS — BP 104/64 | HR 46 | Ht 71.5 in | Wt 288.0 lb

## 2016-01-31 DIAGNOSIS — Z9889 Other specified postprocedural states: Secondary | ICD-10-CM | POA: Diagnosis not present

## 2016-01-31 DIAGNOSIS — Z8679 Personal history of other diseases of the circulatory system: Secondary | ICD-10-CM

## 2016-01-31 DIAGNOSIS — I4891 Unspecified atrial fibrillation: Secondary | ICD-10-CM

## 2016-01-31 DIAGNOSIS — I25810 Atherosclerosis of coronary artery bypass graft(s) without angina pectoris: Secondary | ICD-10-CM | POA: Diagnosis not present

## 2016-01-31 DIAGNOSIS — I255 Ischemic cardiomyopathy: Secondary | ICD-10-CM

## 2016-01-31 DIAGNOSIS — I4819 Other persistent atrial fibrillation: Secondary | ICD-10-CM | POA: Insufficient documentation

## 2016-01-31 MED ORDER — APIXABAN 5 MG PO TABS
5.0000 mg | ORAL_TABLET | Freq: Two times a day (BID) | ORAL | 3 refills | Status: DC
Start: 2016-01-31 — End: 2016-05-01

## 2016-01-31 MED ORDER — FUROSEMIDE 20 MG PO TABS: 20.0000 mg | ORAL_TABLET | Freq: Every day | ORAL | 11 refills | Status: DC

## 2016-01-31 MED ORDER — ISOSORBIDE MONONITRATE ER 30 MG PO TB24: 30.0000 mg | ORAL_TABLET | Freq: Every day | ORAL | 11 refills | Status: DC

## 2016-01-31 MED ORDER — APIXABAN 5 MG PO TABS
5.0000 mg | ORAL_TABLET | Freq: Two times a day (BID) | ORAL | 5 refills | Status: DC
Start: 2016-01-31 — End: 2016-01-31

## 2016-01-31 NOTE — Patient Instructions (Addendum)
Your physician has recommended you make the following change in your medication...  1. STOP metoprolol  Your physician has requested that you have an echocardiogram @ [address]. Echocardiography is a painless test that uses sound waves to create images of your heart. It provides your doctor with information about the size and shape of your heart and how well your heart's chambers and valves are working. This procedure takes approximately one hour. There are no restrictions for this procedure.   Your physician recommends that you schedule a follow-up appointment in: TWO WEEKS with Dr. Rennis GoldenHilty (OK to double book)

## 2016-01-31 NOTE — Telephone Encounter (Signed)
F/u Message  Pt daughter call requesting to speak with RN Eileen StanfordJenna about pt eliquis. Please callback to discuss

## 2016-01-31 NOTE — Progress Notes (Signed)
OFFICE NOTE  Chief Complaint:  No complaints  Primary Care Physician: Thayer Headings, MD  HPI:  Ryan Wise is 75 y/o with a history of CAD, s/p CABG X 4 in 1999 with an LIMA-LAD, SVG-OM, SVG-Dx, SVG-RCA. He presented to [facility] 12/18/12 with a STEMI and was taken to the cath lab by Dr Tresa Endo. This revealed the LIMA to the LAD to be patent with collaterals to the Dx. The SVG- OM: was occluded and the SVG- DX: was occluded The culprit appeared to be occlusion of SVG to distal RCA with no flow proximal due to anastomis occlusion with thrombus. He underwent a very difficult PCI with PTCA/ thrombectomy. He had no flow phenomenon requiring IC/IV NTG/IC verapamil/ angiomax, brilinta 180 mg, integrelin. He ultimately received a Xience Xpedition stent to distal anastomosis. He did have acute, transient CHF but stabilized with diuresis. He did well and was transferred to telemetry on 12/21/12.. Troponin was greater than 20. EF at cath was 35%. EF by echo was 35=40% with severe hypokinesis of the basal inferolateral myocardium; moderate hypokinesis of the basal-mid inferior and mid inferolateral myocardium; and mild hypokinesis of the basal-mid inferoseptal and apical septal myocardium. After he was transferred to telemetry he awaked early in the morning of the 30th with neck pain. He was transferred back to the ICU by the cardiology fellow on call. The pt was seen and examined by Dr Rennis Golden the morning of the 29th. Dr Rennis Golden did not feel his symptoms were cardiac. We added a NSAID and Skelaxin. He took this for a day or 2 he reported his neck pain improved significantly.  Today is here for followup and appears to be doing fairly well. He does report some shortness of breath doing moderate to more intense activities which he is trying to avoid. He did some lawn mowing which made her short of breath after a while. 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 He does feel that his energy level  has improved significantly and hopefully this means that his EF has come up as well. He continues to take naproxen on a daily basis, however it was intended that he only take this for a short period time for his neck pain. He did however discontinue Skelaxin. I reviewed recent laboratory work from his primary care provider today including a cholesterol profile. I was surprised to see how low that was, noting that his total cholesterol was 80, triglycerides 107 HDL 35 and LDL of 24. This is on atorvastatin 80 mg daily.  I saw Ryan Wise back in the office today. He continues to do very well. He is asymptomatic this had no significant weight gain or worsening shortness of breath. He denies any extremity edema. Blood pressure is very well-controlled today 122/60. He recently had laboratory work through his primary care provider which shows excellent cholesterol control. He does have a history of cardiomyopathy and is due for repeat echocardiogram.   Ryan Wise returns today for follow-up. Overall he is doing exceedingly well. He denies any worsening chest pain or shortness of breath. He's done well on his current medicines. Cholesterol is been well controlled. 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 Recent echocardiogram earlier this year shows an improvement in EF up to 45%.  01/31/2016  Ryan Wise returns today for follow-up. Unfortunately recently presented to the emergency department with abdominal pain, nausea and vomiting as well as diarrhea and was thought to have a gastroenteritis.  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 Of note, he had an ultrasound of the abdomen in April 2016 which the radiologist commented that "the abdominal aorta is poorly seen due to overlying bowel gas". Subsequently he underwent EVAR which is complicated by a small type II endoleak. Since surgery Ryan Wise is felt somewhat fatigued and does get short  of breath with some minimal exertion. In the office today he was noted to be in atrial fibrillation with a slow ventricular response at 47. This is a new diagnosis for him. He is on low-dose metoprolol, given his history of cardiomyopathy. He only takes aspirin for his current anticoagulation. 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  PMHx:  Past Medical History:  Diagnosis Date  . Abnormal echocardiogram 05/31/12   EF = 45-50% with more global hypokinesis. There was eccentric hypertrophy of the ventricle. There was a trace AI and mild MR.  Marland Kitchen Acute renal insufficiency   . Arthritis   . Cardiomyopathy, ischemic, EF by cath 35% and Echo 35-40% with STEMI and PCI 12/21/2012  . CHF (congestive heart failure) (HCC)    Acute combined systolic and diastolic heart failure  . Coronary artery disease    CABG in 1999 which was a 5-vessel bypass and had a questionable history of A fib. He underwent monitoring whick showed sinus bradycardia and PACs but no evidence of A fib.  Marland Kitchen GERD (gastroesophageal reflux disease)   . High triglycerides   . Hypertension   . Obesity    unspecified  . Ruptured lumbar disc   . ST elevation myocardial infarction (STEMI) of inferior wall (HCC) 12/18/12   STEMI of inf. wall w/PCI with Xperdition stent to VG to distal RCA    Past Surgical History:  Procedure Laterality Date  . BACK SURGERY     (5) back surgeries  . CORONARY ARTERY BYPASS GRAFT  1999   (CAD) CABG was a 5-vessel bypass and had a questionable history of A fib. He underwent monitoring whick showed sinus bradycardia and PACs but no evidence of A fib.  . ENDOVASCULAR STENT INSERTION N/A 12/28/2015   Procedure: ENDOVASCULAR STENT GRAFT INSERTION;  Surgeon: Nada Libman, MD;  Location: [facility] OR;  Service: Vascular;  Laterality: N/A;  . LEFT HEART CATHETERIZATION WITH CORONARY/GRAFT ANGIOGRAM  12/18/2012   Procedure: LEFT HEART CATHETERIZATION WITH Isabel Caprice;   Surgeon: [name], MD;  Location: [facility] CATH LAB;  Service: Cardiovascular;;  . LUMBAR DISC SURGERY    . open heart surgery  03/1998  . TONSILLECTOMY      FAMHx:  Family History  Problem Relation Age of Onset  . CAD Mother   . Heart disease Mother   . CAD Brother   . Heart attack Brother   . Hyperlipidemia Sister   . Leukemia Father   . CAD Maternal Grandmother   . Brain cancer Brother   . CAD Sister     SOCHx:   reports that he quit smoking about 28 years ago. He has a 135.00 pack-year smoking history. He has quit using smokeless tobacco. His smokeless tobacco use included Chew. He reports that he does not drink alcohol or use drugs.  ALLERGIES:  Allergies  Allergen Reactions  . Aspirin Nausea And Vomiting    Can't take full strength uncoated aspirin  . Other Other (See Comments)    Pain medication that he was given after open heart surgery-sweating and hallucinations   .  Oxycodone Other (See Comments)    Hallucinations    ROS: Pertinent items noted in HPI and remainder of comprehensive ROS otherwise negative.  HOME MEDS: Current Outpatient Prescriptions  Medication Sig Dispense Refill  . acetaminophen (TYLENOL) 325 MG tablet Take 2 tablets (650 mg total) by mouth every 4 (four) hours as needed. (Patient taking differently: Take 650 mg by mouth every 4 (four) hours as needed for mild pain. )    . aspirin EC 81 MG EC tablet Take 1 tablet (81 mg total) by mouth daily.    Marland Kitchen atorvastatin (LIPITOR) 80 MG tablet Take 40 mg by mouth daily.    . fluticasone (FLONASE) 50 MCG/ACT nasal spray Place 1 spray into both nostrils daily as needed for allergies or rhinitis.    . furosemide (LASIX) 20 MG tablet Take 1 tablet (20 mg total) by mouth daily. 30 tablet 11  . isosorbide mononitrate (IMDUR) 30 MG 24 hr tablet Take 1 tablet (30 mg total) by mouth daily. 30 tablet 11  . lisinopril (PRINIVIL,ZESTRIL) 5 MG tablet Take 1 tablet (5 mg total) by mouth daily. Please keep your  upcoming appointment (10/15/15) for refills. 30 tablet 1  . LORazepam (ATIVAN) 0.5 MG tablet Take 0.5 mg by mouth every 8 (eight) hours as needed for anxiety.    . metFORMIN (GLUCOPHAGE-XR) 750 MG 24 hr tablet Take 1 tablet by mouth daily.  1  . nitroGLYCERIN (NITROSTAT) 0.4 MG SL tablet Place 1 tablet (0.4 mg total) under the tongue every 5 (five) minutes as needed for chest pain. 25 tablet 3  . ranitidine (ZANTAC) 150 MG tablet Take 150 mg by mouth 2 (two) times daily.     No current facility-administered medications for this visit.     LABS/IMAGING: No results found for this or any previous visit (from the past 48 hour(s)). No results found.  VITALS: BP 104/64 (BP Location: Left Arm, Patient Position: Sitting, Cuff Size: Large)   Pulse (!) 46   Ht 5' 11.5" (1.816 m)   Wt 288 lb (130.6 kg)   BMI 39.61 kg/m   EXAM: General appearance: alert and no distress Neck: no adenopathy, no carotid bruit, no JVD, supple, symmetrical, trachea midline and thyroid not enlarged, symmetric, no tenderness/mass/nodules Lungs: clear to auscultation bilaterally Heart: irregularly irregular rhythm Abdomen: Obese, soft, non-tender Extremities: extremities normal, atraumatic, no cyanosis or edema Pulses: 2+ and symmetric Skin: Skin color, texture, turgor normal. No rashes or lesions Neurologic: Grossly normal  EKG: Atrial fibrillation with slow ventricular response at 47  ASSESSMENT: 1. New onset atrial fibrillation with slow ventricular response-CHADSVASC score of 5 2. Recent AAA status post -EVAR with type II endoleak 3. Coronary artery disease status post CABG with ST elevation MI and stent placement to the vein graft to PDA, with occluded grafts to the OM and diagonal vessels and a patent LIMA to LAD 4. 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 Hypertension 6. Dyslipidemia  PLAN: 1.   Mr. Neil Crouch had a recently discovered large AAA and is fortunately status post EVAR with a small type II endoleak.  He is reported significant fatigue and lack of energy over the past several weeks and was noted to be in A. fib with slow ventricular response today and 47. This is a new finding for him. I'm advising that he discontinue his metoprolol now to allow a faster rate. His CHADSVASC score is 5 therefore he is at high risk for stroke. I'm concerned however that given his recent endoleak he might  be at increased risk for bleeding if we anticoagulate him. I discussed the case with Dr. Myra GianottiBrabham and he feels is susceptible to put him on anticoagulation. I would recommend starting Eliquis 5 mg BID. Plan for one month anticoagulation if he remains in A. fib we will consider cardioversion. Follow-up with me in 7-10 days.  Chrystie NoseKenneth C. Hilty, MD, [facility] Hospital[facility]ACC Attending Cardiologist [facility]  Chrystie NoseKenneth C Hilty 01/31/2016, 3:23 PM

## 2016-01-31 NOTE — Telephone Encounter (Signed)
Called patient's daughter. Notified her that Dr. Rennis GoldenHilty spoke w/Dr. Myra GianottiBrabham regarding anticoagulation and it was determined this is safe for patient to take with current medications/conditions. Informed her that MD has Rx'ed eliquis 5mg  BID and this has been sent to the pharmacy. She asked about cost, and I informed her that the pharmacy will have to notify her of this. She states patient may not be able to afford. She will come by the office to pick up samples (3 boxes lot: [id number] exp: Jan 2020) and a free 30 day card, along with patient assistance application (MD portion completed and printed Rx for patient assistance provided w/application forms).

## 2016-01-31 NOTE — Telephone Encounter (Signed)
Patient's daughter called in regarding eliquis cost - will be $429. She wanted to make sure that patient assistance paperwork could be ready so they can get started on this. Informed her that this, samples, and free 30 day trial card will be at front desk for pick up.

## 2016-02-05 ENCOUNTER — Ambulatory Visit (HOSPITAL_COMMUNITY)
Admission: RE | Admit: 2016-02-05 | Discharge: 2016-02-05 | Disposition: A | Discharge status: Home / Self Care | Payer: Medicare Other | Source: Ambulatory Visit | Attending: Internal Medicine | Admitting: Internal Medicine

## 2016-02-05 DIAGNOSIS — I255 Ischemic cardiomyopathy: Secondary | ICD-10-CM | POA: Insufficient documentation

## 2016-02-05 DIAGNOSIS — I110 Hypertensive heart disease with heart failure: Secondary | ICD-10-CM | POA: Insufficient documentation

## 2016-02-05 DIAGNOSIS — I2510 Atherosclerotic heart disease of native coronary artery without angina pectoris: Secondary | ICD-10-CM | POA: Insufficient documentation

## 2016-02-05 DIAGNOSIS — I4891 Unspecified atrial fibrillation: Secondary | ICD-10-CM | POA: Diagnosis not present

## 2016-02-05 DIAGNOSIS — I517 Cardiomegaly: Secondary | ICD-10-CM | POA: Diagnosis not present

## 2016-02-05 DIAGNOSIS — Z87891 Personal history of nicotine dependence: Secondary | ICD-10-CM | POA: Diagnosis not present

## 2016-02-05 DIAGNOSIS — Z6839 Body mass index (BMI) 39.0-39.9, adult: Secondary | ICD-10-CM | POA: Insufficient documentation

## 2016-02-05 DIAGNOSIS — E669 Obesity, unspecified: Secondary | ICD-10-CM | POA: Insufficient documentation

## 2016-02-05 DIAGNOSIS — I509 Heart failure, unspecified: Secondary | ICD-10-CM | POA: Insufficient documentation

## 2016-02-05 DIAGNOSIS — I340 Nonrheumatic mitral (valve) insufficiency: Secondary | ICD-10-CM | POA: Diagnosis not present

## 2016-02-05 DIAGNOSIS — I252 Old myocardial infarction: Secondary | ICD-10-CM | POA: Insufficient documentation

## 2016-02-05 LAB — ECHOCARDIOGRAM COMPLETE
E decel time: 151 msec
EERAT: 8.66
FS: 12 % — AB (ref 28–44)
IVS/LV PW RATIO, ED: 1.31
LA diam end sys: 45 mm
LA vol A4C: 76.4 ml
LA vol index: 40.5 mL/m2
LA vol: 100 mL
LADIAMINDEX: 1.82 cm/m2
LASIZE: 45 mm
LV E/e' medial: 8.66
LV PW d: 12.7 mm — AB (ref 0.6–1.1)
LV SIMPSON'S DISK: 42
LV TDI E'LATERAL: 12.7
LV TDI E'MEDIAL: 5.77
LV dias vol index: 70 mL/m2
LV dias vol: 172 mL — AB (ref 62–150)
LV sys vol index: 40 mL/m2
LVEEAVG: 8.66
LVELAT: 12.7 cm/s
LVOT SV: 105 mL
LVOT VTI: 19.7 cm
LVOT area: 5.31 cm2
LVOT diameter: 26 mm
LVOT peak vel: 111 cm/s
LVSYSVOL: 99 mL — AB (ref 21–61)
MV Dec: 151
MVPG: 5 mmHg
MVPKEVEL: 110 m/s
Stroke v: 73 ml

## 2016-02-05 NOTE — Progress Notes (Signed)
  Echocardiogram 2D Echocardiogram has been performed.  Nolon RodBrown, Tony 02/05/2016, 9:47 AM

## 2016-02-14 ENCOUNTER — Encounter: Payer: Self-pay | Admitting: Internal Medicine

## 2016-02-14 ENCOUNTER — Ambulatory Visit (INDEPENDENT_AMBULATORY_CARE_PROVIDER_SITE_OTHER): Payer: Medicare Other | Admitting: Internal Medicine

## 2016-02-14 VITALS — BP 110/74 | HR 58 | Ht 71.5 in | Wt 286.4 lb

## 2016-02-14 DIAGNOSIS — Z01818 Encounter for other preprocedural examination: Secondary | ICD-10-CM | POA: Diagnosis not present

## 2016-02-14 DIAGNOSIS — I2589 Other forms of chronic ischemic heart disease: Secondary | ICD-10-CM

## 2016-02-14 DIAGNOSIS — D689 Coagulation defect, unspecified: Secondary | ICD-10-CM

## 2016-02-14 DIAGNOSIS — I4891 Unspecified atrial fibrillation: Secondary | ICD-10-CM | POA: Diagnosis not present

## 2016-02-14 DIAGNOSIS — I255 Ischemic cardiomyopathy: Secondary | ICD-10-CM | POA: Diagnosis not present

## 2016-02-14 DIAGNOSIS — R5383 Other fatigue: Secondary | ICD-10-CM | POA: Insufficient documentation

## 2016-02-14 NOTE — Progress Notes (Signed)
OFFICE NOTE  Chief Complaint:  Somewhat tired  Primary Care Physician: Thayer Headings, MD  HPI:  Ryan Wise is 75 y/o with a history of CAD, s/p CABG X 4 in 1999 with an LIMA-LAD, SVG-OM, SVG-Dx, SVG-RCA. He presented to [facility] 12/18/12 with a STEMI and was taken to the cath lab by Dr Tresa Endo. This revealed the LIMA to the LAD to be patent with collaterals to the Dx. The SVG- OM: was occluded and the SVG- DX: was occluded The culprit appeared to be occlusion of SVG to distal RCA with no flow proximal due to anastomis occlusion with thrombus. He underwent a very difficult PCI with PTCA/ thrombectomy. He had no flow phenomenon requiring IC/IV NTG/IC verapamil/ angiomax, brilinta 180 mg, integrelin. He ultimately received a Xience Xpedition stent to distal anastomosis. He did have acute, transient CHF but stabilized with diuresis. He did well and was transferred to telemetry on 12/21/12.. Troponin was greater than 20. EF at cath was 35%. EF by echo was 35=40% with severe hypokinesis of the basal inferolateral myocardium; moderate hypokinesis of the basal-mid inferior and mid inferolateral myocardium; and mild hypokinesis of the basal-mid inferoseptal and apical septal myocardium. After he was transferred to telemetry he awaked early in the morning of the 30th with neck pain. He was transferred back to the ICU by the cardiology fellow on call. The pt was seen and examined by Dr Rennis Golden the morning of the 29th. Dr Rennis Golden did not feel his symptoms were cardiac. We added a NSAID and Skelaxin. He took this for a day or 2 he reported his neck pain improved significantly.  Today is here for followup and appears to be doing fairly well. He does report some shortness of breath doing moderate to more intense activities which he is trying to avoid. He did some lawn mowing which made her short of breath after a while. 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 He does feel that his energy level  has improved significantly and hopefully this means that his EF has come up as well. He continues to take naproxen on a daily basis, however it was intended that he only take this for a short period time for his neck pain. He did however discontinue Skelaxin. I reviewed recent laboratory work from his primary care provider today including a cholesterol profile. I was surprised to see how low that was, noting that his total cholesterol was 80, triglycerides 107 HDL 35 and LDL of 24. This is on atorvastatin 80 mg daily.  I saw Ryan Wise back in the office today. He continues to do very well. He is asymptomatic this had no significant weight gain or worsening shortness of breath. He denies any extremity edema. Blood pressure is very well-controlled today 122/60. He recently had laboratory work through his primary care provider which shows excellent cholesterol control. He does have a history of cardiomyopathy and is due for repeat echocardiogram.   Ryan Wise returns today for follow-up. Overall he is doing exceedingly well. He denies any worsening chest pain or shortness of breath. He's done well on his current medicines. Cholesterol is been well controlled. 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 Recent echocardiogram earlier this year shows an improvement in EF up to 45%.  01/31/2016  Ryan Wise returns today for follow-up. Unfortunately recently presented to the emergency department with abdominal pain, nausea and vomiting as well as diarrhea and was thought to have a gastroenteritis.  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 Of note, he had an ultrasound of the abdomen in April 2016 which the radiologist commented that "the abdominal aorta is poorly seen due to overlying bowel gas". Subsequently he underwent EVAR which is complicated by a small type II endoleak. Since surgery Ryan Wise is felt somewhat fatigued and does get short  of breath with some minimal exertion. In the office today he was noted to be in atrial fibrillation with a slow ventricular response at 47. This is a new diagnosis for him. He is on low-dose metoprolol, given his history of cardiomyopathy. He only takes aspirin for his current anticoagulation. 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  02/14/2016  Ryan Wise returns today for follow-up. He has done well with discontinuing his beta blocker. Heart rate is now improved up to 58. He remains in atrial fibrillation. He does not feel quite as fatigued but does notice some fatigue. He seems to be tolerating Eliquis without any bleeding problems. I did review his recent endoleak with Dr. Myra Gianotti who felt it was okay to start Eliquis in the setting of recent EVAR. At this point the next step would be to try to obtain a normal sinus rhythm. He will need to be anticoagulated for at least 3-4 weeks prior to cardioversion.  PMHx:  Past Medical History:  Diagnosis Date  . Abnormal echocardiogram 05/31/12   EF = 45-50% with more global hypokinesis. There was eccentric hypertrophy of the ventricle. There was a trace AI and mild MR.  Marland Kitchen Acute renal insufficiency   . Arthritis   . Cardiomyopathy, ischemic, EF by cath 35% and Echo 35-40% with STEMI and PCI 12/21/2012  . CHF (congestive heart failure) (HCC)    Acute combined systolic and diastolic heart failure  . Coronary artery disease    CABG in 1999 which was a 5-vessel bypass and had a questionable history of A fib. He underwent monitoring whick showed sinus bradycardia and PACs but no evidence of A fib.  Marland Kitchen GERD (gastroesophageal reflux disease)   . High triglycerides   . Hypertension   . Obesity    unspecified  . Ruptured lumbar disc   . ST elevation myocardial infarction (STEMI) of inferior wall (HCC) 12/18/12   STEMI of inf. wall w/PCI with Xperdition stent to VG to distal RCA    Past Surgical History:  Procedure  Laterality Date  . BACK SURGERY     (5) back surgeries  . CORONARY ARTERY BYPASS GRAFT  1999   (CAD) CABG was a 5-vessel bypass and had a questionable history of A fib. He underwent monitoring whick showed sinus bradycardia and PACs but no evidence of A fib.  . ENDOVASCULAR STENT INSERTION N/A 12/28/2015   Procedure: ENDOVASCULAR STENT GRAFT INSERTION;  Surgeon: Nada Libman, MD;  Location: [facility] OR;  Service: Vascular;  Laterality: N/A;  . LEFT HEART CATHETERIZATION WITH CORONARY/GRAFT ANGIOGRAM  12/18/2012   Procedure: LEFT HEART CATHETERIZATION WITH Isabel Caprice;  Surgeon: [name], MD;  Location: [facility] CATH LAB;  Service: Cardiovascular;;  . LUMBAR DISC SURGERY    . open heart surgery  03/1998  . TONSILLECTOMY      FAMHx:  Family History  Problem Relation Age of Onset  . CAD Mother   . Heart disease Mother   . CAD Brother   . Heart attack Brother   . Hyperlipidemia Sister   . Leukemia Father   .  CAD Maternal Grandmother   . Brain cancer Brother   . CAD Sister     SOCHx:   reports that he quit smoking about 28 years ago. He has a 135.00 pack-year smoking history. He has quit using smokeless tobacco. His smokeless tobacco use included Chew. He reports that he does not drink alcohol or use drugs.  ALLERGIES:  Allergies  Allergen Reactions  . Other Other (See Comments)    Pain medication that he was given after open heart surgery-sweating and hallucinations   . Aspirin Nausea And Vomiting    Can't take full strength uncoated aspirin  . Oxycodone Other (See Comments)    Hallucinations    ROS: Pertinent items noted in HPI and remainder of comprehensive ROS otherwise negative.  HOME MEDS: Current Outpatient Prescriptions  Medication Sig Dispense Refill  . acetaminophen (TYLENOL) 325 MG tablet Take 2 tablets (650 mg total) by mouth every 4 (four) hours as needed. (Patient taking differently: Take 650 mg by mouth every 4 (four) hours as needed for mild pain. )     . apixaban (ELIQUIS) 5 MG TABS tablet Take 1 tablet (5 mg total) by mouth 2 (two) times daily. 180 tablet 3  . aspirin EC 81 MG EC tablet Take 1 tablet (81 mg total) by mouth daily.    Marland Kitchen. atorvastatin (LIPITOR) 80 MG tablet Take 40 mg by mouth daily.    . fluticasone (FLONASE) 50 MCG/ACT nasal spray Place 1 spray into both nostrils daily as needed for allergies or rhinitis.    . furosemide (LASIX) 20 MG tablet Take 1 tablet (20 mg total) by mouth daily. 30 tablet 11  . isosorbide mononitrate (IMDUR) 30 MG 24 hr tablet Take 1 tablet (30 mg total) by mouth daily. 30 tablet 11  . lisinopril (PRINIVIL,ZESTRIL) 5 MG tablet Take 1 tablet (5 mg total) by mouth daily. Please keep your upcoming appointment (10/15/15) for refills. 30 tablet 1  . LORazepam (ATIVAN) 0.5 MG tablet Take 0.5 mg by mouth every 8 (eight) hours as needed for anxiety.    . metFORMIN (GLUCOPHAGE-XR) 750 MG 24 hr tablet Take 1 tablet by mouth daily.  1  . nitroGLYCERIN (NITROSTAT) 0.4 MG SL tablet Place 1 tablet (0.4 mg total) under the tongue every 5 (five) minutes as needed for chest pain. 25 tablet 3  . ranitidine (ZANTAC) 150 MG tablet Take 150 mg by mouth 2 (two) times daily.     No current facility-administered medications for this visit.     LABS/IMAGING: No results found for this or any previous visit (from the past 48 hour(s)). No results found.  VITALS: BP 110/74 (BP Location: Right Arm, Patient Position: Sitting, Cuff Size: Large)   Pulse (!) 58   Ht 5' 11.5" (1.816 m)   Wt 286 lb 6.4 oz (129.9 kg)   SpO2 94%   BMI 39.39 kg/m   EXAM: Deferred  EKG: Atrial fibrillation with slow ventricular response at 58  ASSESSMENT: 1. New onset atrial fibrillation with slow ventricular response-CHADSVASC score of 5 2. Recent AAA status post -EVAR with type II endoleak 3. Coronary artery disease status post CABG with ST elevation MI and stent placement to the vein graft to PDA, with occluded grafts to the OM and diagonal  vessels and a patent LIMA to LAD 4. 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 Hypertension 6. Dyslipidemia  PLAN: 1.   Ryan Wise is tolerating Eliquis without any bleeding side effects. Blood pressure is stable after discontinuing metoprolol and heart  rate is in the upper 50s with persistent A. fib. We will schedule him for elective cardioversion in the second week of October. He is advised not to miss any doses of Eliquis until that time.  Chrystie Nose, MD, [facility] Attending Cardiologist [facility]  Chrystie Nose 02/14/2016, 10:40 AM

## 2016-02-14 NOTE — Patient Instructions (Addendum)
Medication Instructions:  Your physician recommends that you continue on your current medications as directed. Please refer to the Current Medication list given to you today.  Labwork: MUST HAVE LABS WORK COMPLETE A WEEK BEFORE CARDIOVERSION BY 02/29/2016.-TSH, CBC, PT, PTT, BMET  Testing/Procedures: Your physician has recommended that you have a Cardioversion (DCCV). Electrical Cardioversion uses a jolt of electricity to your heart either through paddles or wired patches attached to your chest. This is a controlled, usually prescheduled, procedure. Defibrillation is done under light anesthesia in the hospital, and you usually go home the day of the procedure. This is done to get your heart back into a normal rhythm. You are not awake for the procedure. Please see the instruction sheet given to you today.   Follow-Up: Cardioversion needs to be done after 03/03/2016 with Dr Rennis GoldenHilty.   Any Other Special Instructions Will Be Listed Below (If Applicable).     If you need a refill on your cardiac medications before your next appointment, please call your pharmacy.

## 2016-02-15 ENCOUNTER — Telehealth: Payer: Self-pay | Admitting: *Deleted

## 2016-02-15 NOTE — Telephone Encounter (Signed)
Patient assistance for eliquis has been approved. 

## 2016-02-18 ENCOUNTER — Other Ambulatory Visit: Payer: Self-pay | Admitting: *Deleted

## 2016-02-18 DIAGNOSIS — I4891 Unspecified atrial fibrillation: Secondary | ICD-10-CM

## 2016-02-25 ENCOUNTER — Other Ambulatory Visit: Payer: Self-pay

## 2016-02-25 DIAGNOSIS — Z01818 Encounter for other preprocedural examination: Secondary | ICD-10-CM

## 2016-02-25 DIAGNOSIS — D689 Coagulation defect, unspecified: Secondary | ICD-10-CM

## 2016-02-25 DIAGNOSIS — R5383 Other fatigue: Secondary | ICD-10-CM | POA: Diagnosis not present

## 2016-02-25 LAB — CBC
HCT: 38.4 % — ABNORMAL LOW (ref 38.5–50.0)
HEMOGLOBIN: 12.1 g/dL — AB (ref 13.2–17.1)
MCH: 27 pg (ref 27.0–33.0)
MCHC: 31.5 g/dL — ABNORMAL LOW (ref 32.0–36.0)
MCV: 85.7 fL (ref 80.0–100.0)
MPV: 8.8 fL (ref 7.5–12.5)
Platelets: 195 10*3/uL (ref 140–400)
RBC: 4.48 MIL/uL (ref 4.20–5.80)
RDW: 15.7 % — ABNORMAL HIGH (ref 11.0–15.0)
WBC: 6.2 10*3/uL (ref 3.8–10.8)

## 2016-02-25 LAB — BASIC METABOLIC PANEL
BUN: 16 mg/dL (ref 7–25)
CALCIUM: 8.9 mg/dL (ref 8.6–10.3)
CHLORIDE: 103 mmol/L (ref 98–110)
CO2: 27 mmol/L (ref 20–31)
CREATININE: 1.24 mg/dL — AB (ref 0.70–1.18)
Glucose, Bld: 105 mg/dL — ABNORMAL HIGH (ref 65–99)
Potassium: 4.1 mmol/L (ref 3.5–5.3)
SODIUM: 139 mmol/L (ref 135–146)

## 2016-02-25 LAB — TSH: TSH: 1.74 m[IU]/L (ref 0.40–4.50)

## 2016-02-26 LAB — PROTIME-INR
INR: 1.1
Prothrombin Time: 11.4 s (ref 9.0–11.5)

## 2016-02-26 LAB — APTT: APTT: 30 s (ref 22–34)

## 2016-03-03 ENCOUNTER — Encounter (HOSPITAL_COMMUNITY): Payer: Self-pay | Admitting: Certified Registered Nurse Anesthetist

## 2016-03-03 ENCOUNTER — Telehealth (HOSPITAL_COMMUNITY): Payer: Self-pay | Admitting: *Deleted

## 2016-03-03 ENCOUNTER — Ambulatory Visit (HOSPITAL_COMMUNITY): Payer: Medicare Other | Admitting: Certified Registered Nurse Anesthetist

## 2016-03-03 ENCOUNTER — Ambulatory Visit (HOSPITAL_COMMUNITY)
Admission: RE | Admit: 2016-03-03 | Discharge: 2016-03-03 | Disposition: A | Discharge status: Home / Self Care | Payer: Medicare Other | Source: Ambulatory Visit | Attending: Internal Medicine | Admitting: Internal Medicine

## 2016-03-03 ENCOUNTER — Encounter (HOSPITAL_COMMUNITY)
Admission: RE | Disposition: A | Discharge status: Home / Self Care | Payer: Self-pay | Source: Ambulatory Visit | Attending: Internal Medicine

## 2016-03-03 DIAGNOSIS — I110 Hypertensive heart disease with heart failure: Secondary | ICD-10-CM | POA: Diagnosis not present

## 2016-03-03 DIAGNOSIS — Z955 Presence of coronary angioplasty implant and graft: Secondary | ICD-10-CM | POA: Insufficient documentation

## 2016-03-03 DIAGNOSIS — I4891 Unspecified atrial fibrillation: Secondary | ICD-10-CM | POA: Diagnosis not present

## 2016-03-03 DIAGNOSIS — I252 Old myocardial infarction: Secondary | ICD-10-CM | POA: Insufficient documentation

## 2016-03-03 DIAGNOSIS — I509 Heart failure, unspecified: Secondary | ICD-10-CM | POA: Insufficient documentation

## 2016-03-03 DIAGNOSIS — I2510 Atherosclerotic heart disease of native coronary artery without angina pectoris: Secondary | ICD-10-CM | POA: Diagnosis not present

## 2016-03-03 DIAGNOSIS — I739 Peripheral vascular disease, unspecified: Secondary | ICD-10-CM | POA: Diagnosis not present

## 2016-03-03 DIAGNOSIS — E669 Obesity, unspecified: Secondary | ICD-10-CM | POA: Insufficient documentation

## 2016-03-03 DIAGNOSIS — N289 Disorder of kidney and ureter, unspecified: Secondary | ICD-10-CM | POA: Diagnosis not present

## 2016-03-03 DIAGNOSIS — I481 Persistent atrial fibrillation: Secondary | ICD-10-CM

## 2016-03-03 DIAGNOSIS — Z87891 Personal history of nicotine dependence: Secondary | ICD-10-CM | POA: Diagnosis not present

## 2016-03-03 DIAGNOSIS — Z951 Presence of aortocoronary bypass graft: Secondary | ICD-10-CM | POA: Insufficient documentation

## 2016-03-03 DIAGNOSIS — K219 Gastro-esophageal reflux disease without esophagitis: Secondary | ICD-10-CM | POA: Diagnosis not present

## 2016-03-03 DIAGNOSIS — I129 Hypertensive chronic kidney disease with stage 1 through stage 4 chronic kidney disease, or unspecified chronic kidney disease: Secondary | ICD-10-CM | POA: Diagnosis not present

## 2016-03-03 HISTORY — PX: CARDIOVERSION: SHX1299

## 2016-03-03 SURGERY — CARDIOVERSION
Anesthesia: Monitor Anesthesia Care

## 2016-03-03 MED ORDER — LIDOCAINE HCL (CARDIAC) 20 MG/ML IV SOLN
INTRAVENOUS | Status: DC | PRN
  Administered 2016-03-03: 100 mg via INTRAVENOUS

## 2016-03-03 MED ORDER — SODIUM CHLORIDE 0.9 % IV SOLN
INTRAVENOUS | Status: DC
  Administered 2016-03-03 (×2): via INTRAVENOUS

## 2016-03-03 MED ORDER — PROPOFOL 10 MG/ML IV BOLUS
INTRAVENOUS | Status: AC
  Filled 2016-03-03: qty 60

## 2016-03-03 MED ORDER — PROPOFOL 10 MG/ML IV BOLUS
INTRAVENOUS | Status: DC | PRN
  Administered 2016-03-03: 60 mg via INTRAVENOUS
  Administered 2016-03-03: 30 mg via INTRAVENOUS

## 2016-03-03 NOTE — Anesthesia Postprocedure Evaluation (Signed)
Anesthesia Post Note  Patient: Ryan Wise  Procedure(s) Performed: Procedure(s) (LRB): CARDIOVERSION (N/A)  Patient location during evaluation: Endoscopy Anesthesia Type: MAC Level of consciousness: sedated Pain management: pain level controlled Vital Signs Assessment: post-procedure vital signs reviewed and stable Respiratory status: spontaneous breathing Cardiovascular status: stable Postop Assessment: no signs of nausea or vomiting Anesthetic complications: no     Last Vitals:  Vitals:   03/03/16 1020 03/03/16 1027  BP: (!) 165/108 (!) 173/106  Pulse: 64 (!) 59  Resp: 17 17  Temp:      Last Pain:  Vitals:   03/03/16 1007  TempSrc: Oral   Pain Goal:                 [name],[name]

## 2016-03-03 NOTE — Transfer of Care (Signed)
Immediate Anesthesia Transfer of Care Note  Patient: Ryan Wise  Procedure(s) Performed: Procedure(s): CARDIOVERSION (N/A)  Patient Location: PACU and Endoscopy Unit  Anesthesia Type:General  Level of Consciousness: awake, alert  and oriented  Airway & Oxygen Therapy: Patient Spontanous Breathing and Patient connected to nasal cannula oxygen  Post-op Assessment: Report given to RN  Post vital signs: Reviewed and stable  Last Vitals:  Vitals:   03/03/16 0831  BP: (!) 158/99  Pulse: 63  Resp: 17  Temp: 36.6 C    Last Pain:  Vitals:   03/03/16 0831  TempSrc: Oral         Complications: No apparent anesthesia complications

## 2016-03-03 NOTE — H&P (Signed)
    INTERVAL PROCEDURE H&P  History and Physical Interval Note:  03/03/2016 9:29 AM  Ryan Wise has presented today for their planned procedure. The various methods of treatment have been discussed with the patient and family. After consideration of risks, benefits and other options for treatment, the patient has consented to the procedure.  The patients' outpatient history has been reviewed, patient examined, and no change in status from most recent office note within the past 30 days. I have reviewed the patients' chart and labs and will proceed as planned. Questions were answered to the patient's satisfaction.   Ryan NoseKenneth C. Hilty, MD, [facility] Attending Cardiologist [facility]  Ryan NoseKenneth C Wise 03/03/2016, 9:29 AM

## 2016-03-03 NOTE — Anesthesia Preprocedure Evaluation (Signed)
Anesthesia Evaluation  Patient identified by MRN, date of birth, ID band Patient awake    Reviewed: Allergy & Precautions, NPO status , Patient's Chart, lab work & pertinent test results  Airway Mallampati: III  TM Distance: >3 FB Neck ROM: Full    Dental  (+) Edentulous Upper, Poor Dentition,    Pulmonary shortness of breath, former smoker,    Pulmonary exam normal        Cardiovascular hypertension, + CAD, + Past MI, + Cardiac Stents, + CABG, + Peripheral Vascular Disease, +CHF and + DOE   Rhythm:Regular Rate:Normal     Neuro/Psych    GI/Hepatic GERD  ,  Endo/Other    Renal/GU Renal disease     Musculoskeletal   Abdominal (+) + obese,   Peds  Hematology negative hematology ROS (+)   Anesthesia Other Findings   Reproductive/Obstetrics                             Anesthesia Physical  Anesthesia Plan  ASA: III  Anesthesia Plan: MAC   Post-op Pain Management:    Induction: Intravenous  Airway Management Planned: Simple Face Mask  Additional Equipment:   Intra-op Plan:   Post-operative Plan:   Informed Consent: I have reviewed the patients History and Physical, chart, labs and discussed the procedure including the risks, benefits and alternatives for the proposed anesthesia with the patient or authorized representative who has indicated his/her understanding and acceptance.     Plan Discussed with: CRNA  Anesthesia Plan Comments:         Anesthesia Quick Evaluation

## 2016-03-03 NOTE — Telephone Encounter (Signed)
LMOM for pt with appt details and phone number to call back to confirm

## 2016-03-03 NOTE — Anesthesia Preprocedure Evaluation (Signed)
Anesthesia Evaluation  Patient identified by MRN, date of birth, ID band Patient awake    Reviewed: Allergy & Precautions, NPO status , Patient's Chart, lab work & pertinent test results  Airway Mallampati: II  TM Distance: >3 FB     Dental  (+) Edentulous Upper, Dental Advisory Given   Pulmonary former smoker,           Cardiovascular hypertension,      Neuro/Psych    GI/Hepatic   Endo/Other    Renal/GU      Musculoskeletal   Abdominal   Peds  Hematology   Anesthesia Other Findings   Reproductive/Obstetrics                             Anesthesia Physical Anesthesia Plan  ASA: III  Anesthesia Plan: General   Post-op Pain Management:    Induction: Intravenous  Airway Management Planned: Mask  Additional Equipment:   Intra-op Plan:   Post-operative Plan:   Informed Consent: I have reviewed the patients History and Physical, chart, labs and discussed the procedure including the risks, benefits and alternatives for the proposed anesthesia with the patient or authorized representative who has indicated his/her understanding and acceptance.   Dental advisory given  Plan Discussed with:   Anesthesia Plan Comments:         Anesthesia Quick Evaluation

## 2016-03-03 NOTE — CV Procedure (Signed)
    CARDIOVERSION NOTE  Procedure: Electrical Cardioversion Indications:  Atrial Fibrillation  Procedure Details:  Consent: Risks of procedure as well as the alternatives and risks of each were explained to the (patient/caregiver).  Consent for procedure obtained.  Time Out: Verified patient identification, verified procedure, site/side was marked, verified correct patient position, special equipment/implants available, medications/allergies/relevent history reviewed, required imaging and test results available.  Performed  Patient placed on cardiac monitor, pulse oximetry, supplemental oxygen as necessary.  Sedation given: Propofol per anesthesia Pacer pads placed anterior and posterior chest.  Cardioverted 3 time(s).  Cardioverted at 150J, 200J x 2 (248J delivered).   Impression: Findings: Post procedure EKG shows: Atrial Fibrillation Complications: None Patient did tolerate procedure well.  Plan: 1. Unsuccessful DCCV attempt 2. Highly symptomatic A-fib with reduced LV function. D/W a-fib clinic. Will try to arrange for Tikosyn loading and possible repeat cardioversion later this week. 3. Continue Eliquis without missing doses.  Time Spent Directly with the Patient:  30 minutes   Chrystie NoseKenneth C. Hilty, MD, [facility] Attending Cardiologist [facility]  Chrystie NoseKenneth C Hilty 03/03/2016, 10:22 AM

## 2016-03-04 ENCOUNTER — Encounter (HOSPITAL_COMMUNITY): Payer: Self-pay | Admitting: Internal Medicine

## 2016-03-06 ENCOUNTER — Encounter (HOSPITAL_COMMUNITY): Payer: Self-pay | Admitting: Nurse Practitioner

## 2016-03-06 ENCOUNTER — Ambulatory Visit (HOSPITAL_COMMUNITY)
Admission: RE | Admit: 2016-03-06 | Discharge: 2016-03-06 | Disposition: A | Discharge status: Home / Self Care | Payer: Medicare Other | Source: Ambulatory Visit | Attending: Nurse Practitioner | Admitting: Nurse Practitioner

## 2016-03-06 VITALS — BP 132/84 | HR 62 | Ht 71.5 in | Wt 292.8 lb

## 2016-03-06 DIAGNOSIS — R001 Bradycardia, unspecified: Secondary | ICD-10-CM | POA: Insufficient documentation

## 2016-03-06 DIAGNOSIS — Z808 Family history of malignant neoplasm of other organs or systems: Secondary | ICD-10-CM | POA: Diagnosis not present

## 2016-03-06 DIAGNOSIS — E781 Pure hyperglyceridemia: Secondary | ICD-10-CM | POA: Insufficient documentation

## 2016-03-06 DIAGNOSIS — Z951 Presence of aortocoronary bypass graft: Secondary | ICD-10-CM | POA: Diagnosis not present

## 2016-03-06 DIAGNOSIS — K219 Gastro-esophageal reflux disease without esophagitis: Secondary | ICD-10-CM | POA: Diagnosis not present

## 2016-03-06 DIAGNOSIS — Z886 Allergy status to analgesic agent status: Secondary | ICD-10-CM | POA: Diagnosis not present

## 2016-03-06 DIAGNOSIS — M1990 Unspecified osteoarthritis, unspecified site: Secondary | ICD-10-CM | POA: Insufficient documentation

## 2016-03-06 DIAGNOSIS — Z6841 Body Mass Index (BMI) 40.0 and over, adult: Secondary | ICD-10-CM | POA: Diagnosis not present

## 2016-03-06 DIAGNOSIS — Z8249 Family history of ischemic heart disease and other diseases of the circulatory system: Secondary | ICD-10-CM | POA: Diagnosis not present

## 2016-03-06 DIAGNOSIS — Z7901 Long term (current) use of anticoagulants: Secondary | ICD-10-CM | POA: Diagnosis not present

## 2016-03-06 DIAGNOSIS — I481 Persistent atrial fibrillation: Secondary | ICD-10-CM | POA: Insufficient documentation

## 2016-03-06 DIAGNOSIS — Z885 Allergy status to narcotic agent status: Secondary | ICD-10-CM | POA: Insufficient documentation

## 2016-03-06 DIAGNOSIS — I110 Hypertensive heart disease with heart failure: Secondary | ICD-10-CM | POA: Insufficient documentation

## 2016-03-06 DIAGNOSIS — Z7982 Long term (current) use of aspirin: Secondary | ICD-10-CM | POA: Diagnosis not present

## 2016-03-06 DIAGNOSIS — Z87891 Personal history of nicotine dependence: Secondary | ICD-10-CM | POA: Insufficient documentation

## 2016-03-06 DIAGNOSIS — I255 Ischemic cardiomyopathy: Secondary | ICD-10-CM | POA: Diagnosis not present

## 2016-03-06 DIAGNOSIS — Z806 Family history of leukemia: Secondary | ICD-10-CM | POA: Insufficient documentation

## 2016-03-06 DIAGNOSIS — Z7984 Long term (current) use of oral hypoglycemic drugs: Secondary | ICD-10-CM | POA: Insufficient documentation

## 2016-03-06 DIAGNOSIS — I2510 Atherosclerotic heart disease of native coronary artery without angina pectoris: Secondary | ICD-10-CM | POA: Insufficient documentation

## 2016-03-06 DIAGNOSIS — I252 Old myocardial infarction: Secondary | ICD-10-CM | POA: Diagnosis not present

## 2016-03-06 DIAGNOSIS — I5042 Chronic combined systolic (congestive) and diastolic (congestive) heart failure: Secondary | ICD-10-CM | POA: Insufficient documentation

## 2016-03-06 DIAGNOSIS — Z9109 Other allergy status, other than to drugs and biological substances: Secondary | ICD-10-CM | POA: Diagnosis not present

## 2016-03-06 DIAGNOSIS — I10 Essential (primary) hypertension: Secondary | ICD-10-CM | POA: Diagnosis not present

## 2016-03-06 DIAGNOSIS — I4819 Other persistent atrial fibrillation: Secondary | ICD-10-CM

## 2016-03-06 LAB — BASIC METABOLIC PANEL
ANION GAP: 5 (ref 5–15)
BUN: 13 mg/dL (ref 6–20)
CALCIUM: 9.3 mg/dL (ref 8.9–10.3)
CO2: 29 mmol/L (ref 22–32)
Chloride: 105 mmol/L (ref 101–111)
Creatinine, Ser: 1.19 mg/dL (ref 0.61–1.24)
GFR calc Af Amer: >60 mL/min (ref >60)
GFR calc non Af Amer: 58 mL/min — ABNORMAL LOW (ref >60)
GLUCOSE: 101 mg/dL — AB (ref 65–99)
Potassium: 3.9 mmol/L (ref 3.5–5.1)
Sodium: 139 mmol/L (ref 135–145)

## 2016-03-06 LAB — MAGNESIUM: Magnesium: 2 mg/dL (ref 1.7–2.4)

## 2016-03-06 NOTE — Progress Notes (Signed)
Primary Care Physician: Thayer HeadingsMACKENZIE,BRIAN, MD Primary Cardiologist: Rennis GoldenHilty Referring Physician: Staci AcostaHilty   Abdelrahman Renae FickleCraine is a 75 y.o. male with a history of persistent atrial fibrillation who presents for consultation in the [facility].  The patient was initially diagnosed with atrial fibrillation 01/2016 after presenting with symptoms of fatigue and weakness.  [facility] and underwent DCCV which was not successful.  He has also been bradycardic and BB was discontinued with improvement in HR but persistent fatigue and exercise intolerance.   He has chronic LV dysfunction and CAD.  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   Today, he denies symptoms of palpitations, chest pain,  orthopnea, PND, lower extremity edema, dizziness, presyncope, syncope, snoring, daytime somnolence, bleeding, or neurologic sequela. The patient is tolerating medications without difficulties and is otherwise without complaint today.    Atrial Fibrillation Risk Factors:  he does not have symptoms or diagnosis of sleep apnea.  he does not have a history of rheumatic fever.  he does not have a history of alcohol use.  he has a BMI of Body mass index is 40.27 kg/m.Marland Kitchen. Filed Weights   03/06/16 1439  Weight: 292 lb 12.8 oz (132.8 kg)    LA size: 45   Atrial Fibrillation Management history:  Previous antiarrhythmic drugs: none  Previous cardioversions: 02/2016 (unsuccessful)  Previous ablations: none  CHADS2VASC score: 5  Anticoagulation history: Eliquis  Past Medical History:  Diagnosis Date  . Arthritis   . Cardiomyopathy, ischemic, EF by cath 35% and Echo 35-40% with STEMI and PCI 12/21/2012  . CHF (congestive heart failure) (HCC)    Acute combined systolic and diastolic heart failure  . Coronary artery disease    CABG in 1999 which was a 5-vessel bypass and had a questionable history of A fib. He underwent monitoring whick showed sinus bradycardia and  PACs but no evidence of A fib.  Marland Kitchen. GERD (gastroesophageal reflux disease)   . High triglycerides   . Hypertension   . Obesity   . Ruptured lumbar disc   . ST elevation myocardial infarction (STEMI) of inferior wall (HCC) 12/18/12   STEMI of inf. wall w/PCI with Xperdition stent to VG to distal RCA   Past Surgical History:  Procedure Laterality Date  . BACK SURGERY     (5) back surgeries  . CARDIOVERSION N/A 03/03/2016   Procedure: CARDIOVERSION;  Surgeon: Chrystie NoseKenneth C Hilty, MD;  [facility];  Service: Cardiovascular;  Laterality: N/A;  . CORONARY ARTERY BYPASS GRAFT  1999   (CAD) CABG was a 5-vessel bypass and had a questionable history of A fib. He underwent monitoring whick showed sinus bradycardia and PACs but no evidence of A fib.  . ENDOVASCULAR STENT INSERTION N/A 12/28/2015   Procedure: ENDOVASCULAR STENT GRAFT INSERTION;  Surgeon: Nada LibmanVance W Brabham, MD;  [facility];  Service: Vascular;  Laterality: N/A;  . LEFT HEART CATHETERIZATION WITH CORONARY/GRAFT ANGIOGRAM  12/18/2012   Procedure: LEFT HEART CATHETERIZATION WITH Isabel CapriceORONARY/GRAFT ANGIOGRAM;  Surgeon: Lennette Biharihomas A Kelly, MD;  [facility];  Service: Cardiovascular;;  . LUMBAR DISC SURGERY    . open heart surgery  03/1998  . TONSILLECTOMY      Current Outpatient Prescriptions  Medication Sig Dispense Refill  . acetaminophen (TYLENOL) 325 MG tablet Take 2 tablets (650 mg total) by mouth every 4 (four) hours as needed. (Patient taking differently: Take 650 mg by mouth every 4 (four) hours as needed for mild pain. )    .  apixaban (ELIQUIS) 5 MG TABS tablet Take 1 tablet (5 mg total) by mouth 2 (two) times daily. 180 tablet 3  . aspirin EC 81 MG EC tablet Take 1 tablet (81 mg total) by mouth daily.    Marland Kitchen atorvastatin (LIPITOR) 80 MG tablet Take 40 mg by mouth daily.    . fluticasone (FLONASE) 50 MCG/ACT nasal spray Place 1 spray into both nostrils daily as needed for allergies or rhinitis.    . furosemide (LASIX) 20 MG  tablet Take 1 tablet (20 mg total) by mouth daily. 30 tablet 11  . isosorbide mononitrate (IMDUR) 30 MG 24 hr tablet Take 1 tablet (30 mg total) by mouth daily. 30 tablet 11  . lisinopril (PRINIVIL,ZESTRIL) 5 MG tablet Take 1 tablet (5 mg total) by mouth daily. Please keep your upcoming appointment (10/15/15) for refills. 30 tablet 1  . LORazepam (ATIVAN) 0.5 MG tablet Take 0.5 mg by mouth every 8 (eight) hours as needed for anxiety.    . metFORMIN (GLUCOPHAGE-XR) 750 MG 24 hr tablet Take 1 tablet by mouth daily.  1  . nitroGLYCERIN (NITROSTAT) 0.4 MG SL tablet Place 1 tablet (0.4 mg total) under the tongue every 5 (five) minutes as needed for chest pain. 25 tablet 3  . ranitidine (ZANTAC) 150 MG tablet Take 150 mg by mouth 2 (two) times daily.     No current facility-administered medications for this encounter.     Allergies  Allergen Reactions  . Other Other (See Comments)    Pain medication that he was given after open heart surgery-sweating and hallucinations   . Aspirin Nausea And Vomiting    Can't take full strength uncoated aspirin  . Oxycodone Other (See Comments)    Hallucinations    Social History   Social History  . Marital status: Divorced    Spouse name: N/A  . Number of children: 1  . Years of education: N/A   Occupational History  . 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 Smoking status: Former Smoker    Packs/day: 4.50    Years: 30.00    Quit date: 05/07/1987  . Smokeless tobacco: Former Neurosurgeon    Types: Chew  . Alcohol use No  . Drug use: No  . Sexual activity: Not on file   Other Topics Concern  . Not on file   Social History Narrative  . No narrative on file    Family History  Problem Relation Age of Onset  . CAD Mother   . Heart disease Mother   . CAD Brother   . Heart attack Brother   . Hyperlipidemia Sister   . Leukemia Father   . CAD Maternal Grandmother   . Brain cancer Brother   . CAD Sister    The patient  does not have a history of early familial atrial fibrillation or other arrhythmias.  ROS- All systems are reviewed and negative except as per the HPI above.  Physical Exam: Vitals:   03/06/16 1439  BP: 132/84  Pulse: 62  Weight: 292 lb 12.8 oz (132.8 kg)  Height: 5' 11.5" (1.816 m)    GEN- The patient is elderly and obese appearing, alert and oriented x 3 today.   Head- normocephalic, atraumatic Eyes-  Sclera clear, conjunctiva pink Ears- hearing intact Oropharynx- clear Neck- supple  Lungs- Clear to ausculation bilaterally, normal work of breathing Heart- Irregular rate and rhythm GI- soft, NT, ND, + BS Extremities- no clubbing,  cyanosis, BLE dependent edema MS- no significant deformity or atrophy Skin- no rash or lesion Psych- euthymic mood, full affect Neuro- strength and sensation are intact  Wt Readings from Last 3 Encounters:  03/06/16 292 lb 12.8 oz (132.8 kg)  02/14/16 286 lb 6.4 oz (129.9 kg)  01/31/16 288 lb (130.6 kg)    EKG today demonstrates rate controlled AF, QTc Echo 01/2016 demonstrated EF 35-40%, mild MR, diffuse hypokinesis  Epic records are reviewed at length today  Assessment and Plan:  1. Persistent atrial fibrillation The patient has symptomatic persistent atrial fibrillation and has failed DCCV off AAD therapy.  With CAD, prior bradycardia, and LV dysfunction, AAD options are limited. Discussed Tikosyn today and the patient would like to proceed. He has no prescription insurance coverage but has been able to successfully get Brilinta as well as Eliquis from manufacturers at no cost through patient assistance program.  Will check BMET, Mg today.  I have given patient's daughter application for Tikosyn patient assistance program today.  She will call to see if he qualifies for assistance.  Plan Tikosyn admission Monday.  He reports compliance with OAC with no missed doses.  CHADS2VASC is 5  2. Morbid obesity Body mass index is 40.27  kg/m. Lifestyle modification was discussed at length including regular exercise and weight reduction.  3. CAD No recent ischemic symptoms Continue medical therapy  4. HTN Stable No change required today  Follow up with AF clinic after Tikosyn load.   Gypsy Balsam, NP 03/06/2016 7:11 PM

## 2016-03-07 NOTE — Progress Notes (Signed)
Patient ID: Ryan Wise                 DOB: [birth year]                    MRN: [id number]     HPI: Ryan Wise is a 75 y.o. male patient of Dr. Rennis Golden, recently seen in afib clinic by Gypsy Balsam, NP, who presents today for Tikosyn initiation. He has a history of persistent atrial fibrillation that was initially diagnosed in September 2017 after presenting with symptoms of fatigue and weakness. He was started on [facility] and underwent DCCV which was not successful.  He has also been bradycardic and BB was discontinued with improvement in HR but persistent fatigue and exercise intolerance. He has chronic LV dysfunction and CAD which limit AAD options. Decision was made to start Tikosyn.  Pt educated on potential risks with Tikosyn including QTc prolongation. Pt is aware of the importance of not missing any doses and will call the office if they miss more than 2 doses in a row. Pt is anticoagulated with Eliquis and reports no missed doses within the past month. Pt is currently not taking any QTc prolongating or contraindicated medications. He does not have any insurance coverage and should qualify for Tikosyn through patient assistance program.  EKG: Reviewed by Dr Johney Frame. Atrial fibrillation with vent rate of 67bpm and QTc .  Labs: Mg 2.1, K 3.9, SCr 1.45, Weight 292 lb 4 oz, CrCl = 9mL/min  Past Medical History:  Diagnosis Date  . Arthritis   . Cardiomyopathy, ischemic, EF by cath 35% and Echo 35-40% with STEMI and PCI 12/21/2012  . CHF (congestive heart failure) (HCC)    Acute combined systolic and diastolic heart failure  . Coronary artery disease    CABG in 1999 which was a 5-vessel bypass and had a questionable history of A fib. He underwent monitoring whick showed sinus bradycardia and PACs but no evidence of A fib.  Marland Kitchen GERD (gastroesophageal reflux disease)   . High triglycerides   . Hypertension   . Obesity   . Ruptured lumbar disc   . ST elevation myocardial infarction  (STEMI) of inferior wall (HCC) 12/18/12   STEMI of inf. wall w/PCI with Xperdition stent to VG to distal RCA    Current Outpatient Prescriptions on File Prior to Visit  Medication Sig Dispense Refill  . acetaminophen (TYLENOL) 325 MG tablet Take 2 tablets (650 mg total) by mouth every 4 (four) hours as needed. (Patient taking differently: Take 650 mg by mouth every 4 (four) hours as needed for mild pain. )    . apixaban (ELIQUIS) 5 MG TABS tablet Take 1 tablet (5 mg total) by mouth 2 (two) times daily. 180 tablet 3  . aspirin EC 81 MG EC tablet Take 1 tablet (81 mg total) by mouth daily.    Marland Kitchen atorvastatin (LIPITOR) 80 MG tablet Take 40 mg by mouth daily.    . fluticasone (FLONASE) 50 MCG/ACT nasal spray Place 1 spray into both nostrils daily as needed for allergies or rhinitis.    . furosemide (LASIX) 20 MG tablet Take 1 tablet (20 mg total) by mouth daily. 30 tablet 11  . isosorbide mononitrate (IMDUR) 30 MG 24 hr tablet Take 1 tablet (30 mg total) by mouth daily. 30 tablet 11  . lisinopril (PRINIVIL,ZESTRIL) 5 MG tablet Take 1 tablet (5 mg total) by mouth daily. Please keep your upcoming appointment (10/15/15) for refills. 30 tablet 1  .  LORazepam (ATIVAN) 0.5 MG tablet Take 0.5 mg by mouth every 8 (eight) hours as needed for anxiety.    . metFORMIN (GLUCOPHAGE-XR) 750 MG 24 hr tablet Take 1 tablet by mouth daily.  1  . nitroGLYCERIN (NITROSTAT) 0.4 MG SL tablet Place 1 tablet (0.4 mg total) under the tongue every 5 (five) minutes as needed for chest pain. 25 tablet 3  . ranitidine (ZANTAC) 150 MG tablet Take 150 mg by mouth 2 (two) times daily.     No current facility-administered medications on file prior to visit.     Allergies  Allergen Reactions  . Other Other (See Comments)    Pain medication that he was given after open heart surgery-sweating and hallucinations   . Aspirin Nausea And Vomiting    Can't take full strength uncoated aspirin  . Oxycodone Other (See Comments)     Hallucinations    Assessment/Plan:  1. Atrial fibrillation - EKG reviewed by Dr Johney FrameAllred and acceptable for Tikosyn initiation. Mg at goal, K slightly low at 3.9. Will need supplementation inpatient. CrCl > 5760mL/min, anticipated starting dose of Tiksyn is 500mcg BID. Pt has Tikosyn patient assistance paperwork and will drop this off in clinic after discharge. Pt aware to report to admitting.   Avynn Klassen E. Lando Alcalde, PharmD, CPP [facility] [address], [place], [place] [address] Phone: [phone]; Fax: [phone] 03/10/2016 11:29 AM

## 2016-03-10 ENCOUNTER — Encounter (HOSPITAL_COMMUNITY): Payer: Self-pay | Admitting: Nurse Practitioner

## 2016-03-10 ENCOUNTER — Ambulatory Visit (INDEPENDENT_AMBULATORY_CARE_PROVIDER_SITE_OTHER): Payer: Medicare Other | Admitting: Pharmacist

## 2016-03-10 ENCOUNTER — Inpatient Hospital Stay (HOSPITAL_COMMUNITY)
Admission: AD | Admit: 2016-03-10 | Discharge: 2016-03-13 | DRG: 309 | Disposition: A | Discharge status: Home / Self Care | Payer: Medicare Other | Source: Ambulatory Visit | Attending: Internal Medicine | Admitting: Internal Medicine

## 2016-03-10 VITALS — Wt 292.2 lb

## 2016-03-10 DIAGNOSIS — Z951 Presence of aortocoronary bypass graft: Secondary | ICD-10-CM | POA: Diagnosis not present

## 2016-03-10 DIAGNOSIS — N179 Acute kidney failure, unspecified: Secondary | ICD-10-CM | POA: Diagnosis not present

## 2016-03-10 DIAGNOSIS — I255 Ischemic cardiomyopathy: Secondary | ICD-10-CM | POA: Diagnosis not present

## 2016-03-10 DIAGNOSIS — I2510 Atherosclerotic heart disease of native coronary artery without angina pectoris: Secondary | ICD-10-CM | POA: Diagnosis present

## 2016-03-10 DIAGNOSIS — K219 Gastro-esophageal reflux disease without esophagitis: Secondary | ICD-10-CM | POA: Diagnosis not present

## 2016-03-10 DIAGNOSIS — Z7982 Long term (current) use of aspirin: Secondary | ICD-10-CM | POA: Diagnosis not present

## 2016-03-10 DIAGNOSIS — I252 Old myocardial infarction: Secondary | ICD-10-CM

## 2016-03-10 DIAGNOSIS — Z23 Encounter for immunization: Secondary | ICD-10-CM

## 2016-03-10 DIAGNOSIS — Z7901 Long term (current) use of anticoagulants: Secondary | ICD-10-CM

## 2016-03-10 DIAGNOSIS — I10 Essential (primary) hypertension: Secondary | ICD-10-CM | POA: Diagnosis not present

## 2016-03-10 DIAGNOSIS — Z955 Presence of coronary angioplasty implant and graft: Secondary | ICD-10-CM

## 2016-03-10 DIAGNOSIS — I5042 Chronic combined systolic (congestive) and diastolic (congestive) heart failure: Secondary | ICD-10-CM | POA: Diagnosis present

## 2016-03-10 DIAGNOSIS — I509 Heart failure, unspecified: Secondary | ICD-10-CM | POA: Diagnosis not present

## 2016-03-10 DIAGNOSIS — Z7984 Long term (current) use of oral hypoglycemic drugs: Secondary | ICD-10-CM

## 2016-03-10 DIAGNOSIS — I4891 Unspecified atrial fibrillation: Secondary | ICD-10-CM | POA: Diagnosis not present

## 2016-03-10 DIAGNOSIS — I4819 Other persistent atrial fibrillation: Secondary | ICD-10-CM

## 2016-03-10 DIAGNOSIS — Z7951 Long term (current) use of inhaled steroids: Secondary | ICD-10-CM

## 2016-03-10 DIAGNOSIS — I110 Hypertensive heart disease with heart failure: Secondary | ICD-10-CM | POA: Diagnosis not present

## 2016-03-10 DIAGNOSIS — R001 Bradycardia, unspecified: Secondary | ICD-10-CM | POA: Diagnosis present

## 2016-03-10 DIAGNOSIS — I481 Persistent atrial fibrillation: Secondary | ICD-10-CM | POA: Diagnosis not present

## 2016-03-10 DIAGNOSIS — Z6841 Body Mass Index (BMI) 40.0 and over, adult: Secondary | ICD-10-CM | POA: Diagnosis not present

## 2016-03-10 DIAGNOSIS — Z8249 Family history of ischemic heart disease and other diseases of the circulatory system: Secondary | ICD-10-CM | POA: Diagnosis not present

## 2016-03-10 DIAGNOSIS — E119 Type 2 diabetes mellitus without complications: Secondary | ICD-10-CM | POA: Diagnosis not present

## 2016-03-10 DIAGNOSIS — Z87891 Personal history of nicotine dependence: Secondary | ICD-10-CM

## 2016-03-10 HISTORY — DX: Chronic combined systolic (congestive) and diastolic (congestive) heart failure: I50.42

## 2016-03-10 HISTORY — DX: Other persistent atrial fibrillation: I48.19

## 2016-03-10 HISTORY — DX: Infrarenal abdominal aortic aneurysm, without rupture: I71.43

## 2016-03-10 HISTORY — DX: Type 2 diabetes mellitus without complications: E11.9

## 2016-03-10 HISTORY — DX: Abdominal aortic aneurysm, without rupture: I71.4

## 2016-03-10 LAB — BASIC METABOLIC PANEL
Anion gap: 6 (ref 5–15)
Anion gap: 8 (ref 5–15)
BUN: 19 mg/dL (ref 6–20)
BUN: 18 mg/dL (ref 6–20)
CHLORIDE: 104 mmol/L (ref 101–111)
CHLORIDE: 105 mmol/L (ref 101–111)
CO2: 29 mmol/L (ref 22–32)
CO2: 27 mmol/L (ref 22–32)
CREATININE: 1.45 mg/dL — AB (ref 0.61–1.24)
Calcium: 8.9 mg/dL (ref 8.9–10.3)
Calcium: 8.8 mg/dL — ABNORMAL LOW (ref 8.9–10.3)
Creatinine, Ser: 1.39 mg/dL — ABNORMAL HIGH (ref 0.61–1.24)
GFR calc Af Amer: 53 mL/min — ABNORMAL LOW (ref >60)
GFR calc Af Amer: 56 mL/min — ABNORMAL LOW (ref >60)
GFR calc non Af Amer: 46 mL/min — ABNORMAL LOW (ref >60)
GFR calc non Af Amer: 48 mL/min — ABNORMAL LOW (ref >60)
GLUCOSE: 112 mg/dL — AB (ref 65–99)
GLUCOSE: 127 mg/dL — AB (ref 65–99)
POTASSIUM: 3.9 mmol/L (ref 3.5–5.1)
POTASSIUM: 4 mmol/L (ref 3.5–5.1)
Sodium: 139 mmol/L (ref 135–145)
Sodium: 140 mmol/L (ref 135–145)

## 2016-03-10 LAB — MAGNESIUM
MAGNESIUM: 2.1 mg/dL (ref 1.7–2.4)
MAGNESIUM: 1.9 mg/dL (ref 1.7–2.4)

## 2016-03-10 MED ORDER — NITROGLYCERIN 0.4 MG SL SUBL: 0.4000 mg | SUBLINGUAL_TABLET | SUBLINGUAL | Status: DC | PRN

## 2016-03-10 MED ORDER — SODIUM CHLORIDE 0.9% FLUSH
3.0000 mL | Freq: Two times a day (BID) | INTRAVENOUS | Status: DC
  Administered 2016-03-10 – 2016-03-13 (×5): 3 mL via INTRAVENOUS

## 2016-03-10 MED ORDER — ATORVASTATIN CALCIUM 40 MG PO TABS
40.0000 mg | ORAL_TABLET | Freq: Every day | ORAL | Status: DC
  Administered 2016-03-10 – 2016-03-11 (×2): 40 mg via ORAL
  Filled 2016-03-10 (×2): qty 1

## 2016-03-10 MED ORDER — SODIUM CHLORIDE 0.9% FLUSH: 3.0000 mL | INTRAVENOUS | Status: DC | PRN

## 2016-03-10 MED ORDER — LISINOPRIL 5 MG PO TABS
5.0000 mg | ORAL_TABLET | Freq: Every day | ORAL | Status: DC
  Administered 2016-03-10 – 2016-03-12 (×3): 5 mg via ORAL
  Filled 2016-03-10 (×3): qty 1

## 2016-03-10 MED ORDER — POTASSIUM CHLORIDE CRYS ER 20 MEQ PO TBCR
20.0000 meq | EXTENDED_RELEASE_TABLET | Freq: Every day | ORAL | Status: DC | PRN
  Administered 2016-03-10: 20 meq via ORAL
  Filled 2016-03-10: qty 1

## 2016-03-10 MED ORDER — SODIUM CHLORIDE 0.9 % IV SOLN: 250.0000 mL | INTRAVENOUS | Status: DC | PRN

## 2016-03-10 MED ORDER — APIXABAN 5 MG PO TABS
5.0000 mg | ORAL_TABLET | Freq: Two times a day (BID) | ORAL | Status: DC
  Administered 2016-03-10 – 2016-03-11 (×2): 5 mg via ORAL
  Filled 2016-03-10 (×2): qty 1

## 2016-03-10 MED ORDER — INSULIN ASPART 100 UNIT/ML ~~LOC~~ SOLN
0.0000 [IU] | Freq: Three times a day (TID) | SUBCUTANEOUS | Status: DC
  Administered 2016-03-12: 2 [IU] via SUBCUTANEOUS

## 2016-03-10 MED ORDER — FLUTICASONE PROPIONATE 50 MCG/ACT NA SUSP
1.0000 | Freq: Every day | NASAL | Status: DC | PRN
Start: 2016-03-10 — End: 2016-03-13

## 2016-03-10 MED ORDER — LORAZEPAM 0.5 MG PO TABS
0.5000 mg | ORAL_TABLET | Freq: Three times a day (TID) | ORAL | Status: DC | PRN
  Administered 2016-03-10 – 2016-03-11 (×2): 0.5 mg via ORAL
  Filled 2016-03-10 (×2): qty 1

## 2016-03-10 MED ORDER — DOFETILIDE 500 MCG PO CAPS
500.0000 ug | ORAL_CAPSULE | Freq: Two times a day (BID) | ORAL | Status: DC
  Administered 2016-03-10 – 2016-03-13 (×6): 500 ug via ORAL
  Filled 2016-03-10 (×6): qty 1

## 2016-03-10 MED ORDER — LISINOPRIL 5 MG PO TABS: 5.0000 mg | ORAL_TABLET | Freq: Every day | ORAL | Status: DC

## 2016-03-10 MED ORDER — ASPIRIN EC 81 MG PO TBEC
81.0000 mg | DELAYED_RELEASE_TABLET | Freq: Every day | ORAL | Status: DC
  Administered 2016-03-11: 81 mg via ORAL
  Filled 2016-03-10: qty 1

## 2016-03-10 MED ORDER — ISOSORBIDE MONONITRATE ER 30 MG PO TB24
30.0000 mg | ORAL_TABLET | Freq: Every day | ORAL | Status: DC
  Administered 2016-03-11: 30 mg via ORAL
  Filled 2016-03-10: qty 1

## 2016-03-10 MED ORDER — FAMOTIDINE 20 MG PO TABS
20.0000 mg | ORAL_TABLET | Freq: Two times a day (BID) | ORAL | Status: DC
  Administered 2016-03-10 – 2016-03-11 (×2): 20 mg via ORAL
  Filled 2016-03-10 (×2): qty 1

## 2016-03-10 MED ORDER — FAMOTIDINE 20 MG PO TABS: 20.0000 mg | ORAL_TABLET | Freq: Every day | ORAL | Status: DC

## 2016-03-10 NOTE — Progress Notes (Signed)
New Direct Admission for tikosyn induction. Pt brought to the floor in stable condition. Vitals taken. Initial Assessment done. All immediate pertinent needs to patient addressed. Patient Guide given to patient. Important safety instructions relating to hospitalization reviewed with patient. Patient verbalized understanding.  2 RN assessed for IV insertion. Pt also states "I am a tough stick." IV team consult. Paged Ward Givenshris Berge NP regarding orders. Will continue to monitor pt.  Jilda PandaBethany Nayomi Tabron RN

## 2016-03-10 NOTE — Progress Notes (Signed)
Pt.  With orders to start Tikosyn when K+ level  is 4. Current K+ is 3.9. On call MD for cardiology made aware via text page. RN awaiting ordered BMET results before administering medication. Pharamacy also made aware. RN will continue to monitor pt. For changes in condition. Ryan Wise, Cheryll DessertKaren Cherrell

## 2016-03-10 NOTE — H&P (Signed)
History & Physical    Patient ID: Ryan Wise MRN: [id number], DOB/AGE: January 18, 1941   Admit date: 03/10/2016   Primary Physician: Ryan Headings, MD Primary Cardiologist: C. Hilty, MD   Patient Profile    75 y/o ? with a h/o CAD s/p CABG, ICM, chronic combined CHF, AAA s/p EVAR 01/2016, HTN, HL, GERD, and Afib dx 01/2016, who presents for elective tikosyn initiation.  Past Medical History    Past Medical History:  Diagnosis Date  . Aneurysm of infrarenal abdominal aorta (HCC)    a. 01/2016 s/p endovascular repair of AAA (EVAR).  . Arthritis   . Chronic combined systolic and diastolic CHF (congestive heart failure) (HCC)    a. 01/2016 Echo: EF 35-40%, diff HK, mildly dil Ao root, mild MR, mildly dil LA.  Marland Kitchen Coronary artery disease    a. 1999 s/p CABG x4 (LIMA->LAD, VG->OM, VG->Diag, VG->RCA); b. 11/2012 Inferior STEMI/PCI: LIMA->LAD ok, VG->Diag 100, VG->OM 100, VG->RCA was culprit (PTCA/thrombectomy->Xience Xpedition DES), EF 25%.  Marland Kitchen GERD (gastroesophageal reflux disease)   . High triglycerides   . Hypertension   . Ischemic Cardiomyopathy    a. 01/2016 Echo: EF 35-40%, diff HK, inflat, inf AK.  . Obesity   . Persistent atrial fibrillation (HCC)    a. Dx 01/2016-->slow vent response on beta blocker;  b. CHA2DS2VASc = 6-->Eliquis;  c. 03/03/2016 unsuccessful DCCV.  . Ruptured lumbar disc   . ST elevation myocardial infarction (STEMI) of inferior wall (HCC) 12/18/12   STEMI of inf. wall w/PCI with Xperdition stent to VG to distal RCA  . Type II diabetes mellitus (HCC)     Past Surgical History:  Procedure Laterality Date  . BACK SURGERY     (5) back surgeries  . CARDIOVERSION N/A 03/03/2016   Procedure: CARDIOVERSION;  Surgeon: Ryan Nose, MD;  [facility] END[facility];  Service: Cardiovascular;  Laterality: N/A;  . CORONARY ARTERY BYPASS GRAFT  1999   (CAD) CABG was a 5-vessel bypass and had a questionable history of A fib. He underwent monitoring whick showed sinus  bradycardia and PACs but no evidence of A fib.  . ENDOVASCULAR STENT INSERTION N/A 12/28/2015   Procedure: ENDOVASCULAR STENT GRAFT INSERTION;  Surgeon: Ryan Libman, MD;  Location: [facility] OR;  Service: Vascular;  Laterality: N/A;  . LEFT HEART CATHETERIZATION WITH CORONARY/GRAFT ANGIOGRAM  12/18/2012   Procedure: LEFT HEART CATHETERIZATION WITH Ryan Wise;  Surgeon: Ryan Bihari, MD;  Location: [facility] CATH LAB;  Service: Cardiovascular;;  . LUMBAR DISC SURGERY    . open heart surgery  03/1998  . TONSILLECTOMY       Allergies  Allergies  Allergen Reactions  . Other Other (See Comments)    Pain medication that he was given after open heart surgery-sweating and hallucinations   . Aspirin Nausea And Vomiting    Can't take full strength uncoated aspirin  . Oxycodone Other (See Comments)    Hallucinations    History of Present Illness    75 y/o ? with the above complex PMH including CAD s/p CABG x 4 in 1999 wit subsequent inferior STEMI in 11/2012 and finding of sev dzs in the VG  RCA with occluded VG's  OM and Diag.  The LIMA  LAD was widely patent and the VG  RCA was successfully treated with PTCA an DES.  He also has a h/o ICM and chronic combined CHF with an EF of 35-40% by echo in 01/2016, HTN, HL, DM, GERD and AAA dx in early August 2017  and he subsequently underwent EVAR by Dr. Myra Wise.  When he f/u with Dr. Rennis Wise in clinic, he was found to be in slow AFib.  He was placed on eliquis and  blocker was d/c'd due to slow ventricular response.  At the time, [name] had significant fatigue. Upon f/u on 9/21, fatigue had improved some.  He remained in AF and decision was made to pursue DCCV on 10/9. Unfortunately, this was unsuccessful.  He then f/u in AFib clinic and arrangements were made for elective admission today for tikosyn loading.  Home Medications    Prior to Admission medications   Medication Sig Start Date End Date Taking? Authorizing Provider  acetaminophen (TYLENOL)  325 MG tablet Take 2 tablets (650 mg total) by mouth every 4 (four) hours as needed. Patient taking differently: Take 650 mg by mouth every 4 (four) hours as needed for mild pain.  12/22/12   Ryan Derrick, PA-C  apixaban (ELIQUIS) 5 MG TABS tablet Take 1 tablet (5 mg total) by mouth 2 (two) times daily. 01/31/16   Ryan Nose, MD  aspirin EC 81 MG EC tablet Take 1 tablet (81 mg total) by mouth daily. 12/22/12   Ryan Derrick, PA-C  atorvastatin (LIPITOR) 80 MG tablet Take 40 mg by mouth daily.    Historical Provider, MD  fluticasone (FLONASE) 50 MCG/ACT nasal spray Place 1 spray into both nostrils daily as needed for allergies or rhinitis.    Historical Provider, MD  furosemide (LASIX) 20 MG tablet Take 1 tablet (20 mg total) by mouth daily. 01/31/16   Ryan Nose, MD  isosorbide mononitrate (IMDUR) 30 MG 24 hr tablet Take 1 tablet (30 mg total) by mouth daily. 01/31/16   Ryan Nose, MD  lisinopril (PRINIVIL,ZESTRIL) 5 MG tablet Take 1 tablet (5 mg total) by mouth daily. Please keep your upcoming appointment (10/15/15) for refills. 10/08/15   Ryan Nose, MD  LORazepam (ATIVAN) 0.5 MG tablet Take 0.5 mg by mouth every 8 (eight) hours as needed for anxiety. 01/07/13   Ryan Nose, MD  metFORMIN (GLUCOPHAGE-XR) 750 MG 24 hr tablet Take 1 tablet by mouth daily. 11/23/15   Historical Provider, MD  nitroGLYCERIN (NITROSTAT) 0.4 MG SL tablet Place 1 tablet (0.4 mg total) under the tongue every 5 (five) minutes as needed for chest pain. 12/22/12   Ryan Derrick, PA-C  ranitidine (ZANTAC) 150 MG tablet Take 150 mg by mouth 2 (two) times daily.    Historical Provider, MD    Family History    Family History  Problem Relation Age of Onset  . CAD Mother   . Heart disease Mother   . CAD Brother   . Heart attack Brother   . Hyperlipidemia Sister   . Leukemia Father   . CAD Maternal Grandmother   . Brain cancer Brother   . CAD Sister     Social History    Social History   Social History    . Marital status: Divorced    Spouse name: N/A  . Number of children: 1  . Years of education: N/A   Occupational History  . 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 Smoking status: Former Smoker    Packs/day: 4.50    Years: 30.00    Quit date: 05/07/1987  . Smokeless tobacco: Former Neurosurgeon    Types: Chew  . Alcohol use No  . Drug use: No  .  Sexual activity: Not on file   Other Topics Concern  . Not on file   Social History Narrative  . No narrative on file     Review of Systems    General:  +++ fatigue.  No chills, fever, night sweats or weight changes.  Cardiovascular:  No chest pain, dyspnea on exertion, edema, orthopnea, palpitations, paroxysmal nocturnal dyspnea. Dermatological: No rash, lesions/masses Respiratory: No cough, dyspnea Urologic: No hematuria, dysuria Abdominal:   No nausea, vomiting, diarrhea, bright red blood per rectum, melena, or hematemesis Neurologic:  No visual changes, wkns, changes in mental status. All other systems reviewed and are otherwise negative except as noted above.  Physical Exam    Blood pressure 136/75, pulse 70, temperature 98.5 F (36.9 C), temperature source Oral, resp. rate 18, height 6' (1.829 m), weight 289 lb 4.8 oz (131.2 kg), SpO2 95 %.  General: Pleasant, NAD Psych: Normal affect. Neuro: Alert and oriented X 3. Moves all extremities spontaneously. HEENT: Normal  Neck: Supple without bruits or JVD. Lungs:  Resp regular and unlabored, CTA. Heart: IR, IR, no s3, s4, or murmurs. Abdomen: Soft, non-tender, non-distended, BS + x 4.  Extremities: No clubbing, cyanosis or edema. DP/PT/Radials 2+ and equal bilaterally.  Labs    Lab Results  Component Value Date   WBC 6.2 02/25/2016   HGB 12.1 (L) 02/25/2016   HCT 38.4 (L) 02/25/2016   MCV 85.7 02/25/2016   PLT 195 02/25/2016     Recent Labs Lab 03/10/16 1004  NA 139  K 3.9  CL 104  CO2 29  BUN 19  CREATININE 1.45*  CALCIUM 8.9   GLUCOSE 112*     Radiology Studies    No results found.  ECG & Cardiac Imaging    Afib, 67, inf infarct, QTc 433.  Assessment & Plan    1.  Persistent Atrial Fibrillation:  Dx in early Sept and anticoagulated with eliquis since then in the setting of CHA2DS2VASc of 6.  Attempt @ DCCV earlier this month was unsuccessful and thus he presents this evening for tikosyn initiation.  Based on Cr cl, he qualifies for 500 mcg q12 h (est cr cl 179.31).  K and Mg acceptable.  Will start tikosyn tonight with plan to f/u ECG 2 hrs after each dose.  Daily bmets.  Plan repeat DCCV Wed morning (4 doses @ that point) or Thursday morning if necessary.  2.  CAD:  This has been stable.  Cont asa, statin, nitrate.  3.  Chronic combined systolic/diastolic CHF/ICM:  Cont acei.  No  blocker 2/2 bradycardia when on in past.  4.  AKI:  Creat up this morning. Hold lasix.  Follow.  Signed, Nicolasa Duckinghristopher Berge, NP 03/10/2016, 7:50 PM  EP Attending  Patient seen and examined. Agree with above. The patient presents today for initiation of Tikosyn having failed to maintain NSR after DCCV. He does not experience palpitations. He does feel some fatigue and weakness and is more tired. Exam reveals a well appearing 75 yo man, NAD, IRIR rhythm and clear lungs. Abdomen is benign and Extremities are without edema. Neuro appears normal. Tele/ECG demonstrate atrial fib with a controlled VR and QT is ok.  A/P 1. Atrial fib - will initiate Tikosyn. Anticipate repeat DCCV on Wednesday if he does not spontaneously return to NSR on tikosyn alone.  2. CAD - he denies anginal symptoms.  3. Bradycardia - his rate is well controlled on no AV nodal blocking drugs. Will watch his conduction.  Leonia ReevesGregg Aragon Scarantino,M.D.

## 2016-03-10 NOTE — Progress Notes (Signed)
Pharmacy Review for Dofetilide (Tikosyn) Initiation  Admit Complaint: 75 y.o. male admitted 03/10/2016 with atrial fibrillation to be initiated on dofetilide.   Assessment:  Patient Exclusion Criteria: If any screening criteria checked as "Yes", then  patient  should NOT receive dofetilide until criteria item is corrected. If "Yes" please indicate correction plan.  YES  NO Patient  Exclusion Criteria Correction Plan  [x]  []  Baseline QTc interval is greater than or equal to 440 msec. IF above YES box checked dofetilide contraindicated unless patient has ICD; then may proceed if QTc 500-550 msec or with known ventricular conduction abnormalities may proceed with QTc 550-600 msec. QTc = 454   []  [x]  Magnesium level is less than 1.8 mEq/l : Last magnesium:  Lab Results  Component Value Date   MG 2.1 03/10/2016         [x]  []  Potassium level is less than 4 mEq/l : Last potassium:  Lab Results  Component Value Date   K 3.9 03/10/2016       PRN potassium supplement ordered  []  [x]  Patient is known or suspected to have a digoxin level greater than 2 ng/ml: No results found for: DIGOXIN    []  [x]  Creatinine clearance less than 20 ml/min (calculated using Cockcroft-Gault, actual body weight and serum creatinine): Estimated Creatinine Clearance: 61.6 mL/min (by C-G formula based on SCr of 1.45 mg/dL (H)).    []  [x]  Patient has received drugs known to prolong the QT intervals within the last 48 hours (phenothiazines, tricyclics or tetracyclic antidepressants, erythromycin, H-1 antihistamines, cisapride, fluoroquinolones, azithromycin). Drugs not listed above may have an, as yet, undetected potential to prolong the QT interval, updated information on QT prolonging agents is available at this website:QT prolonging agents   []  [x]  Patient received a dose of hydrochlorothiazide (Oretic) alone or in any combination including triamterene (Dyazide, Maxzide) in the last 48 hours.   []  [x]  Patient  received a medication known to increase dofetilide plasma concentrations prior to initial dofetilide dose:  . Trimethoprim (Primsol, Proloprim) in the last 36 hours . Verapamil (Calan, Verelan) in the last 36 hours or a sustained release dose in the last 72 hours . Megestrol (Megace) in the last 5 days  . Cimetidine (Tagamet) in the last 6 hours . Ketoconazole (Nizoral) in the last 24 hours . Itraconazole (Sporanox) in the last 48 hours  . Prochlorperazine (Compazine) in the last 36 hours    []  [x]  Patient is known to have a history of torsades de pointes; congenital or acquired long QT syndromes.   []  [x]  Patient has received a Class 1 antiarrhythmic with less than 2 half-lives since last dose. (Disopyramide, Quinidine, Procainamide, Lidocaine, Mexiletine, Flecainide, Propafenone)   []  [x]  Patient has received amiodarone therapy in the past 3 months or amiodarone level is greater than 0.3 ng/ml.    Patient has been appropriately anticoagulated with Apixaban  Ordering provider was confirmed at [email or web address] if they are not listed on the [facility] Authorized Prescribers list.  Goal of Therapy: Follow renal function, electrolytes, potential drug interactions, and dose adjustment. Provide education and 1 week supply at discharge.  Plan:  [x]   Physician selected initial dose within range recommended for patients level of renal function - will monitor for response.  []   Physician selected initial dose outside of range recommended for patients level of renal function - will discuss if the dose should be altered at this time.   Select One Calculated CrCl  Dose q12h  [x]  > 60 ml/min  500 mcg  []  40-60 ml/min 250 mcg  []  20-40 ml/min 125 mcg   2. Follow up QTc after the first 5 doses, renal function, electrolytes (K & Mg) daily x 3 days, dose adjustment, success of initiation and facilitate 1 week discharge supply as clinically indicated.  3. Initiate Tikosyn education video (Call [phone] and  ask for video # 116).  4. Place Enrollment Form on the chart for discharge supply of dofetilide.   Fredrik RiggerMarkle, Duard Spiewak Sue 7:56 PM 03/10/2016

## 2016-03-11 ENCOUNTER — Encounter (HOSPITAL_COMMUNITY): Payer: Self-pay | Admitting: *Deleted

## 2016-03-11 ENCOUNTER — Telehealth: Payer: Self-pay | Admitting: Pharmacist

## 2016-03-11 ENCOUNTER — Other Ambulatory Visit: Payer: Self-pay

## 2016-03-11 DIAGNOSIS — I10 Essential (primary) hypertension: Secondary | ICD-10-CM

## 2016-03-11 LAB — GLUCOSE, CAPILLARY
GLUCOSE-CAPILLARY: 109 mg/dL — AB (ref 65–99)
GLUCOSE-CAPILLARY: 114 mg/dL — AB (ref 65–99)
GLUCOSE-CAPILLARY: 123 mg/dL — AB (ref 65–99)
Glucose-Capillary: 103 mg/dL — ABNORMAL HIGH (ref 65–99)
Glucose-Capillary: 112 mg/dL — ABNORMAL HIGH (ref 65–99)

## 2016-03-11 LAB — BASIC METABOLIC PANEL
ANION GAP: 7 (ref 5–15)
BUN: 18 mg/dL (ref 6–20)
CALCIUM: 8.7 mg/dL — AB (ref 8.9–10.3)
CHLORIDE: 106 mmol/L (ref 101–111)
CO2: 27 mmol/L (ref 22–32)
Creatinine, Ser: 1.46 mg/dL — ABNORMAL HIGH (ref 0.61–1.24)
GFR calc Af Amer: 52 mL/min — ABNORMAL LOW (ref >60)
GFR calc non Af Amer: 45 mL/min — ABNORMAL LOW (ref >60)
GLUCOSE: 112 mg/dL — AB (ref 65–99)
Potassium: 4 mmol/L (ref 3.5–5.1)
Sodium: 140 mmol/L (ref 135–145)

## 2016-03-11 LAB — MAGNESIUM: Magnesium: 2.1 mg/dL (ref 1.7–2.4)

## 2016-03-11 MED ORDER — INFLUENZA VAC SPLIT QUAD 0.5 ML IM SUSY
0.5000 mL | PREFILLED_SYRINGE | INTRAMUSCULAR | Status: AC
  Administered 2016-03-13: 0.5 mL via INTRAMUSCULAR
  Filled 2016-03-11: qty 0.5

## 2016-03-11 MED ORDER — ATORVASTATIN CALCIUM 40 MG PO TABS
40.0000 mg | ORAL_TABLET | Freq: Every day | ORAL | Status: DC
  Administered 2016-03-12 – 2016-03-13 (×2): 40 mg via ORAL
  Filled 2016-03-11 (×2): qty 1

## 2016-03-11 MED ORDER — LORAZEPAM 0.5 MG PO TABS
0.5000 mg | ORAL_TABLET | Freq: Three times a day (TID) | ORAL | Status: DC
  Administered 2016-03-11 – 2016-03-13 (×5): 0.5 mg via ORAL
  Filled 2016-03-11 (×5): qty 1

## 2016-03-11 MED ORDER — FAMOTIDINE 20 MG PO TABS
20.0000 mg | ORAL_TABLET | Freq: Two times a day (BID) | ORAL | Status: DC
  Administered 2016-03-11 – 2016-03-13 (×4): 20 mg via ORAL
  Filled 2016-03-11 (×4): qty 1

## 2016-03-11 MED ORDER — APIXABAN 5 MG PO TABS
5.0000 mg | ORAL_TABLET | Freq: Two times a day (BID) | ORAL | Status: DC
  Administered 2016-03-11 – 2016-03-13 (×4): 5 mg via ORAL
  Filled 2016-03-11 (×4): qty 1

## 2016-03-11 MED ORDER — ASPIRIN EC 81 MG PO TBEC
81.0000 mg | DELAYED_RELEASE_TABLET | Freq: Every day | ORAL | Status: DC
  Administered 2016-03-12 – 2016-03-13 (×2): 81 mg via ORAL
  Filled 2016-03-11 (×2): qty 1

## 2016-03-11 MED ORDER — ISOSORBIDE MONONITRATE ER 30 MG PO TB24
30.0000 mg | ORAL_TABLET | Freq: Every day | ORAL | Status: DC
  Administered 2016-03-12 – 2016-03-13 (×2): 30 mg via ORAL
  Filled 2016-03-11 (×2): qty 1

## 2016-03-11 NOTE — Telephone Encounter (Signed)
Received a phone call from pt's daughter and spoke with her for 20 minutes to try to appease her. She was upset by the nursing staff last night when her father was admitted for Tikosyn. She did not go into detail but stated that multiple nurses were rude to her. She was looking for an update on her father and I advised her that the hospitalist would have a better answer for her since I do not work in the hospital. Advised her that based on the inpatient notes today, her father is still in afib and there is a tentative plan for cardioversion tomorrow if he remains in afib. She stated she was going to the hospital today and that she wanted to speak to someone in the hospital about her complaints.

## 2016-03-11 NOTE — Care Management Note (Signed)
Case Management Note  Patient Details  Name: Ryan Wise MRN: [id number] Date of Birth: 12/08/1940  Subjective/Objective:       Admitted with Persistent Atrial Fib             Action/Plan: Patient is self employed, owns a [employer]; has private insurance with Medicare A&B, no prescription drug coverage. He obtains Eliquis and Brilinta though the patient assistance program from the drug company and his MD has given his daughter paperwork to complete for medication assistance for Tikosyn. Patient is to meet with someone regarding Medicare part D ( Drug plan ) on 03/17/2016. CM will continue to follow for DCP  Expected Discharge Date:   possible 03/21/2016               Expected Discharge Plan:  Home/Self Care  Discharge planning Services  CM Consult, Medication Assistance  Status of Service:  In process, will continue to follow  Reola MosherChandler, Avonda Toso L, RN,MHA,BSN [phone] 03/11/2016, 9:49 AM

## 2016-03-11 NOTE — Progress Notes (Signed)
Pt is currently ordered Ativan 0.5mg  PO TiD PRN while here in hospital.  However, pt states that when at home, he takes Ativan 0.5mg  PO "scheduled" TiD, and has requested that the physician change the Ativan orders to reflect it to be scheduled.  EP - NP is aware.

## 2016-03-11 NOTE — Progress Notes (Signed)
SUBJECTIVE: The patient is doing well today.  At this time, he denies chest pain, shortness of breath, or any new concerns.  CURRENT MEDICATIONS: . apixaban  5 mg Oral BID  . aspirin EC  81 mg Oral Daily  . atorvastatin  40 mg Oral Daily  . dofetilide  500 mcg Oral BID  . famotidine  20 mg Oral BID  . insulin aspart  0-15 Units Subcutaneous TID WC  . isosorbide mononitrate  30 mg Oral Daily  . lisinopril  5 mg Oral QHS  . sodium chloride flush  3 mL Intravenous Q12H      OBJECTIVE: Physical Exam: Vitals:   03/10/16 1830 03/10/16 2108 03/11/16 0045 03/11/16 0415  BP: 136/75 (!) 146/94 114/75 137/86  Pulse: 70 66 64 66  Resp: 18 18 18 18   Temp: 98.5 F (36.9 C) 98.3 F (36.8 C) 98.5 F (36.9 C) 97.9 F (36.6 C)  TempSrc: Oral Oral Oral Oral  SpO2: 95% 95% 94% 94%  Weight: 289 lb 4.8 oz (131.2 kg)   288 lb 9.6 oz (130.9 kg)  Height: 6' (1.829 m)       Intake/Output Summary (Last 24 hours) at 03/11/16 1028 Last data filed at 03/11/16 0500  Gross per 24 hour  Intake              240 ml  Output              450 ml  Net             -210 ml    Telemetry reveals atrial fibrillation  GEN- The patient is obese appearing, alert and oriented x 3 today.   Head- normocephalic, atraumatic Eyes-  Sclera clear, conjunctiva pink Ears- hearing intact Oropharynx- clear Neck- supple  Lungs- Clear to ausculation bilaterally, normal work of breathing Heart- Irregular rate and rhythm  GI- soft, NT, ND, + BS Extremities- no clubbing, cyanosis, or edema Skin- no rash or lesion Psych- euthymic mood, full affect Neuro- strength and sensation are intact  LABS: Basic Metabolic Panel:  Recent Labs  82/95/6210/16/17 2045 03/11/16 0259  NA 140 140  K 4.0 4.0  CL 105 106  CO2 27 27  GLUCOSE 127* 112*  BUN 18 18  CREATININE 1.39* 1.46*  CALCIUM 8.8* 8.7*  MG 1.9 2.1    ASSESSMENT AND PLAN:  Active Problems:   Persistent atrial fibrillation (HCC)   1. Persistent atrial  fibrillation Continue Tikosyn QTc, BMET, Mg stable Continue Eliquis for CHADS2VASC of 5 I gave his daughter information about the patient assistance program for Tikosyn in the office last week as he does not have Rx drug coverage. He has been succesfully able to get Brilinta and Eliquis with patient assistance programs in the past.  I asked her to call to be sure he qualified for assistance with Tikosyn prior to admission.  Will sign paperwork at discharge for him to obtain med through patient assistance program Plan DCCV tomorrow if still in AF   2. Morbid obesity Body mass index is 40.27 kg/m. Weight loss encouarged  3. CAD No recent ischemic symptoms Continue medical therapy  4. HTN Stable No change required today  Gypsy BalsamAmber Seiler, NP 03/11/2016 10:31 AM  I have seen, examined the patient, and reviewed the above assessment and plan.  On exam, comfortable.  IRRR. Changes to above are made where necessary.  If he does not convert to sinus, I would anticipate cardioversion tomorrow.  NPO after midnight tonight. Given  recent aortic stent and PVD, will keep on ASA in addition to eliquis for now.  May benefit from outpatient sleep study.  Co Sign: Hillis Range, MD 03/11/2016 11:27 AM

## 2016-03-11 NOTE — Discharge Instructions (Signed)

## 2016-03-12 ENCOUNTER — Other Ambulatory Visit: Payer: Self-pay

## 2016-03-12 ENCOUNTER — Inpatient Hospital Stay (HOSPITAL_COMMUNITY): Payer: Medicare Other | Admitting: Anesthesiology

## 2016-03-12 ENCOUNTER — Encounter (HOSPITAL_COMMUNITY)
Admission: AD | Disposition: A | Discharge status: Home / Self Care | Payer: Self-pay | Source: Ambulatory Visit | Attending: Internal Medicine

## 2016-03-12 ENCOUNTER — Encounter (HOSPITAL_COMMUNITY): Payer: Self-pay | Admitting: *Deleted

## 2016-03-12 DIAGNOSIS — I4891 Unspecified atrial fibrillation: Secondary | ICD-10-CM

## 2016-03-12 HISTORY — PX: CARDIOVERSION: SHX1299

## 2016-03-12 LAB — BASIC METABOLIC PANEL
Anion gap: 6 (ref 5–15)
BUN: 19 mg/dL (ref 6–20)
CHLORIDE: 105 mmol/L (ref 101–111)
CO2: 28 mmol/L (ref 22–32)
Calcium: 8.7 mg/dL — ABNORMAL LOW (ref 8.9–10.3)
Creatinine, Ser: 1.21 mg/dL (ref 0.61–1.24)
GFR calc Af Amer: >60 mL/min (ref >60)
GFR calc non Af Amer: 57 mL/min — ABNORMAL LOW (ref >60)
GLUCOSE: 144 mg/dL — AB (ref 65–99)
POTASSIUM: 4.1 mmol/L (ref 3.5–5.1)
Sodium: 139 mmol/L (ref 135–145)

## 2016-03-12 LAB — GLUCOSE, CAPILLARY
GLUCOSE-CAPILLARY: 128 mg/dL — AB (ref 65–99)
GLUCOSE-CAPILLARY: 166 mg/dL — AB (ref 65–99)
GLUCOSE-CAPILLARY: 107 mg/dL — AB (ref 65–99)
Glucose-Capillary: 107 mg/dL — ABNORMAL HIGH (ref 65–99)

## 2016-03-12 LAB — MAGNESIUM: MAGNESIUM: 2.2 mg/dL (ref 1.7–2.4)

## 2016-03-12 SURGERY — CARDIOVERSION
Anesthesia: General

## 2016-03-12 MED ORDER — SODIUM CHLORIDE 0.9 % IV SOLN
INTRAVENOUS | Status: DC
  Administered 2016-03-12 (×2): via INTRAVENOUS

## 2016-03-12 MED ORDER — PROPOFOL 10 MG/ML IV BOLUS
INTRAVENOUS | Status: DC | PRN
  Administered 2016-03-12: 80 mg via INTRAVENOUS

## 2016-03-12 MED ORDER — LIDOCAINE HCL (CARDIAC) 20 MG/ML IV SOLN
INTRAVENOUS | Status: DC | PRN
  Administered 2016-03-12: 60 mg via INTRATRACHEAL

## 2016-03-12 NOTE — Anesthesia Preprocedure Evaluation (Addendum)
Anesthesia Evaluation  Patient identified by MRN, date of birth, ID band Patient awake    Reviewed: Allergy & Precautions, H&P , NPO status , Patient's Chart, lab work & pertinent test results  Airway Mallampati: I  TM Distance: >3 FB Neck ROM: full    Dental  (+) Upper Dentures, Missing, Dental Advidsory Given   Pulmonary former smoker,    breath sounds clear to auscultation       Cardiovascular hypertension, + CAD, + Past MI, + Peripheral Vascular Disease, +CHF and + DOE   Rhythm:Irregular Rate:Normal     Neuro/Psych    GI/Hepatic GERD  Medicated and Controlled,  Endo/Other  diabetes, Type 2, Oral Hypoglycemic AgentsMorbid obesity  Renal/GU      Musculoskeletal  (+) Arthritis , Osteoarthritis,    Abdominal   Peds  Hematology   Anesthesia Other Findings - Left ventricle: The cavity size was moderately dilated. Wall   thickness was increased in a pattern of mild LVH. There was mild   focal basal hypertrophy of the septum. Systolic function was   moderately reduced. The estimated ejection fraction was in the   range of 35% to 40%. Diffuse hypokinesis. There is akinesis of   the inferolateral and inferior myocardium. Doppler parameters are   consistent with high ventricular filling pressure. - Aortic root: The aortic root was mildly dilated. - Mitral valve: There was mild regurgitation. - Left atrium: The atrium was mildly dilated.  Impressions:  - Akinesis of the inferior and inferior lateral walls with overall   moderately reduced LV function; elevated LV filling pressure;   mild LAE; mild MR; mildly dilated ascending aorta (suggest CTA or   MRA to further assess).  Reproductive/Obstetrics                         Anesthesia Physical Anesthesia Plan  ASA: III  Anesthesia Plan: General   Post-op Pain Management:    Induction: Intravenous  Airway Management Planned:  Mask  Additional Equipment:   Intra-op Plan:   Post-operative Plan:   Informed Consent: I have reviewed the patients History and Physical, chart, labs and discussed the procedure including the risks, benefits and alternatives for the proposed anesthesia with the patient or authorized representative who has indicated his/her understanding and acceptance.   Dental advisory given and Dental Advisory Given  Plan Discussed with: CRNA and Anesthesiologist  Anesthesia Plan Comments:        Anesthesia Quick Evaluation

## 2016-03-12 NOTE — Consult Note (Signed)
   [facility] Inpatient Consult   03/12/2016  Ryan Wise August 04, 1940 [id number]   Referral received to assess for care management services. This is the patient's 2nd admission in the past 3 months. Chart review reveals a 75 y/o gentleman with a h/o CAD s/p CABG, ICM, chronic combined CHF, AAA s/p EVAR 01/2016, HTN, HL, GERD, and Afib dx 01/2016, who presents for elective tikosyn initiation.   Met with the patient regarding the benefits of [facility] services. Explained that [facility] is a covered benefit of insurance and he confirms his primary care provider is Dr. Thressa Sheller. . Review information for [facility] Care Management and a brochure and 24 hour nurse advise line magnet was provided with contact information.  Explained that [employer] [facility] does not interfere with or replace any services arranged by the inpatient care management staff.  Patient declined services with [facility].  States, "I was pretty active prior to the past two episodes.  My daughter takes care of my medical stuff now and I don't think I will need anything else for right now.  But, I will call if I run into trouble."  For questions, please contact:  Natividad Brood, RN BSN [facility] Liaison  [phone] business mobile phone Toll free office [phone]

## 2016-03-12 NOTE — Transfer of Care (Signed)
Immediate Anesthesia Transfer of Care Note  Patient: Ryan Wise  Procedure(s) Performed: Procedure(s): CARDIOVERSION (N/A)  Patient Location: Endoscopy Unit  Anesthesia Type:MAC  Level of Consciousness: awake, alert  and oriented  Airway & Oxygen Therapy: Patient Spontanous Breathing and Patient connected to nasal cannula oxygen  Post-op Assessment: Report given to RN, Post -op Vital signs reviewed and stable and Patient moving all extremities X 4  Post vital signs: Reviewed and stable  Last Vitals:  Vitals:   03/12/16 0435 03/12/16 0925  BP: 137/82 (!) 168/95  Pulse: 67 70  Resp: 18 16  Temp: 36.6 C 36.8 C    Last Pain:  Vitals:   03/12/16 0928  TempSrc:   PainSc: 0-No pain         Complications: No apparent anesthesia complications

## 2016-03-12 NOTE — Anesthesia Postprocedure Evaluation (Signed)
Anesthesia Post Note  Patient: Ryan Wise  Procedure(s) Performed: Procedure(s) (LRB): CARDIOVERSION (N/A)  Patient location during evaluation: PACU Anesthesia Type: General Level of consciousness: awake and alert Pain management: pain level controlled Vital Signs Assessment: post-procedure vital signs reviewed and stable Respiratory status: spontaneous breathing, nonlabored ventilation and respiratory function stable Cardiovascular status: blood pressure returned to baseline and stable Postop Assessment: no signs of nausea or vomiting Anesthetic complications: no    Last Vitals:  Vitals:   03/12/16 1020 03/12/16 1027  BP: (!) 148/96   Pulse: 76 73  Resp: 18 18  Temp:      Last Pain:  Vitals:   03/12/16 1010  TempSrc: Oral  PainSc:                  Israa Caban,W. EDMOND

## 2016-03-12 NOTE — H&P (View-Only) (Signed)
SUBJECTIVE: The patient is doing well today.  At this time, he denies chest pain, shortness of breath, or any new concerns.  CURRENT MEDICATIONS: . apixaban  5 mg Oral BID  . aspirin EC  81 mg Oral Daily  . atorvastatin  40 mg Oral Daily  . dofetilide  500 mcg Oral BID  . famotidine  20 mg Oral BID  . insulin aspart  0-15 Units Subcutaneous TID WC  . isosorbide mononitrate  30 mg Oral Daily  . lisinopril  5 mg Oral QHS  . sodium chloride flush  3 mL Intravenous Q12H      OBJECTIVE: Physical Exam: Vitals:   03/10/16 1830 03/10/16 2108 03/11/16 0045 03/11/16 0415  BP: 136/75 (!) 146/94 114/75 137/86  Pulse: 70 66 64 66  Resp: 18 18 18 18   Temp: 98.5 F (36.9 C) 98.3 F (36.8 C) 98.5 F (36.9 C) 97.9 F (36.6 C)  TempSrc: Oral Oral Oral Oral  SpO2: 95% 95% 94% 94%  Weight: 289 lb 4.8 oz (131.2 kg)   288 lb 9.6 oz (130.9 kg)  Height: 6' (1.829 m)       Intake/Output Summary (Last 24 hours) at 03/11/16 1028 Last data filed at 03/11/16 0500  Gross per 24 hour  Intake              240 ml  Output              450 ml  Net             -210 ml    Telemetry reveals atrial fibrillation  GEN- The patient is obese appearing, alert and oriented x 3 today.   Head- normocephalic, atraumatic Eyes-  Sclera clear, conjunctiva pink Ears- hearing intact Oropharynx- clear Neck- supple  Lungs- Clear to ausculation bilaterally, normal work of breathing Heart- Irregular rate and rhythm  GI- soft, NT, ND, + BS Extremities- no clubbing, cyanosis, or edema Skin- no rash or lesion Psych- euthymic mood, full affect Neuro- strength and sensation are intact  LABS: Basic Metabolic Panel:  Recent Labs  82/95/6210/16/17 2045 03/11/16 0259  NA 140 140  K 4.0 4.0  CL 105 106  CO2 27 27  GLUCOSE 127* 112*  BUN 18 18  CREATININE 1.39* 1.46*  CALCIUM 8.8* 8.7*  MG 1.9 2.1    ASSESSMENT AND PLAN:  Active Problems:   Persistent atrial fibrillation (HCC)   1. Persistent atrial  fibrillation Continue Tikosyn QTc, BMET, Mg stable Continue Eliquis for CHADS2VASC of 5 I gave his daughter information about the patient assistance program for Tikosyn in the office last week as he does not have Rx drug coverage. He has been succesfully able to get Brilinta and Eliquis with patient assistance programs in the past.  I asked her to call to be sure he qualified for assistance with Tikosyn prior to admission.  Will sign paperwork at discharge for him to obtain med through patient assistance program Plan DCCV tomorrow if still in AF   2. Morbid obesity Body mass index is 40.27 kg/m. Weight loss encouarged  3. CAD No recent ischemic symptoms Continue medical therapy  4. HTN Stable No change required today  Gypsy BalsamAmber Seiler, NP 03/11/2016 10:31 AM  I have seen, examined the patient, and reviewed the above assessment and plan.  On exam, comfortable.  IRRR. Changes to above are made where necessary.  If he does not convert to sinus, I would anticipate cardioversion tomorrow.  NPO after midnight tonight. Given  recent aortic stent and PVD, will keep on ASA in addition to eliquis for now.  May benefit from outpatient sleep study.  Co Sign: Hillis Range, MD 03/11/2016 11:27 AM

## 2016-03-12 NOTE — CV Procedure (Signed)
Electrical Cardioversion Procedure Note Kerby MoorsVernon Sedore [id number] 10/17/1940  Procedure: Electrical Cardioversion Indications:  Atrial Fibrillation  Procedure Details Consent: Risks of procedure as well as the alternatives and risks of each were explained to the (patient/caregiver).  Consent for procedure obtained. Time Out: Verified patient identification, verified procedure, site/side was marked, verified correct patient position, special equipment/implants available, medications/allergies/relevent history reviewed, required imaging and test results available.  Performed  Patient placed on cardiac monitor, pulse oximetry, supplemental oxygen as necessary.  Sedation given: Propofol 80 mg, lidocaine 60 mg Pacer pads placed anterior and posterior chest.  Cardioverted 1 time(s).  Cardioverted at 200J.  Evaluation Findings: Post procedure EKG shows: NSR Complications: None Patient did tolerate procedure well.   Taeshaun Rames C. Duke Salviaandolph, MD, [facility] CenterFACC  03/12/2016, 10:01 AM

## 2016-03-12 NOTE — Progress Notes (Addendum)
SUBJECTIVE: The patient is doing well today.  At this time, he denies chest pain, shortness of breath, or any new concerns.  CURRENT MEDICATIONS: . apixaban  5 mg Oral BID  . aspirin EC  81 mg Oral Daily  . atorvastatin  40 mg Oral Daily  . dofetilide  500 mcg Oral BID  . famotidine  20 mg Oral BID  . Influenza vac split quadrivalent PF  0.5 mL Intramuscular Tomorrow-1000  . insulin aspart  0-15 Units Subcutaneous TID WC  . isosorbide mononitrate  30 mg Oral Daily  . lisinopril  5 mg Oral QHS  . LORazepam  0.5 mg Oral Q8H  . sodium chloride flush  3 mL Intravenous Q12H      OBJECTIVE: Physical Exam: Vitals:   03/11/16 0415 03/11/16 1143 03/11/16 2022 03/12/16 0435  BP: 137/86 (!) 148/76 (!) 149/73 137/82  Pulse: 66 66 66 67  Resp: 18 18 18 18   Temp: 97.9 F (36.6 C) 97.8 F (36.6 C) 99.4 F (37.4 C) 97.8 F (36.6 C)  TempSrc: Oral Oral Oral Oral  SpO2: 94% 96% 95% 93%  Weight: 288 lb 9.6 oz (130.9 kg)   288 lb 5.8 oz (130.8 kg)  Height:        Intake/Output Summary (Last 24 hours) at 03/12/16 0804 Last data filed at 03/12/16 [id number]  Gross per 24 hour  Intake              963 ml  Output             2875 ml  Net            -1912 ml    Telemetry reveals atrial fibrillation  GEN- The patient is obese appearing, alert and oriented x 3 today.   Head- normocephalic, atraumatic Eyes-  Sclera clear, conjunctiva pink Ears- hearing intact Oropharynx- clear Neck- supple  Lungs- Clear to ausculation bilaterally, normal work of breathing Heart- Irregular rate and rhythm  GI- soft, NT, ND, + BS Extremities- no clubbing, cyanosis, or edema Skin- no rash or lesion Psych- euthymic mood, full affect Neuro- strength and sensation are intact  LABS: Basic Metabolic Panel:  Recent Labs  78/29/5610/17/17 0259 03/12/16 0526  NA 140 139  K 4.0 4.1  CL 106 105  CO2 27 28  GLUCOSE 112* 144*  BUN 18 19  CREATININE 1.46* 1.21  CALCIUM 8.7* 8.7*  MG 2.1 2.2    ASSESSMENT  AND PLAN:  Active Problems:   Persistent atrial fibrillation (HCC)   1. Persistent atrial fibrillation Continue Tikosyn QTc, BMET, Mg stable Continue Eliquis for CHADS2VASC of 5 I gave his daughter information about the patient assistance program for Tikosyn in the office last week as he does not have Rx drug coverage. He has been succesfully able to get Brilinta and Eliquis with patient assistance programs in the past.  I asked her to call to be sure he qualified for assistance with Tikosyn prior to admission.  Will sign paperwork at discharge for him to obtain med through patient assistance program Plan DCCV today   2. Morbid obesity Body mass index is 40.27 kg/m. Weight loss encouarged  3. CAD No recent ischemic symptoms Continue medical therapy  4. HTN Stable No change required today  Gypsy BalsamAmber Seiler, NP 03/12/2016 8:04 AM   I have seen, examined the patient, and reviewed the above assessment and plan.  On exam, iRRR. Changes to above are made where necessary.  Planned for cardioversion today.  Co  Sign: Hillis Range, MD 03/12/2016 8:33 PM

## 2016-03-12 NOTE — Interval H&P Note (Signed)
History and Physical Interval Note:  03/12/2016 9:37 AM  Ryan Wise  has presented today for surgery, with the diagnosis of afib  The various methods of treatment have been discussed with the patient and family. After consideration of risks, benefits and other options for treatment, the patient has consented to  Procedure(s): CARDIOVERSION (N/A) as a surgical intervention .  The patient's history has been reviewed, patient examined, no change in status, stable for surgery.  I have reviewed the patient's chart and labs.  Questions were answered to the patient's satisfaction.     Ryan Mccarey C. Duke Salviaandolph, MD, [facility] OrleansFACC 03/12/2016  9:37 AM

## 2016-03-12 NOTE — Progress Notes (Signed)
Called daughter, Marcelino DusterMichelle, as instructed, to inform her that pt was getting ready to go down for procedure.  Left VM.

## 2016-03-13 ENCOUNTER — Other Ambulatory Visit (HOSPITAL_COMMUNITY): Payer: Self-pay | Admitting: *Deleted

## 2016-03-13 LAB — GLUCOSE, CAPILLARY
GLUCOSE-CAPILLARY: 109 mg/dL — AB (ref 65–99)
GLUCOSE-CAPILLARY: 129 mg/dL — AB (ref 65–99)

## 2016-03-13 LAB — BASIC METABOLIC PANEL
Anion gap: 6 (ref 5–15)
BUN: 16 mg/dL (ref 6–20)
CALCIUM: 8.8 mg/dL — AB (ref 8.9–10.3)
CHLORIDE: 105 mmol/L (ref 101–111)
CO2: 27 mmol/L (ref 22–32)
CREATININE: 1.23 mg/dL (ref 0.61–1.24)
GFR calc Af Amer: >60 mL/min (ref >60)
GFR calc non Af Amer: 56 mL/min — ABNORMAL LOW (ref >60)
Glucose, Bld: 109 mg/dL — ABNORMAL HIGH (ref 65–99)
Potassium: 4.2 mmol/L (ref 3.5–5.1)
SODIUM: 138 mmol/L (ref 135–145)

## 2016-03-13 LAB — MAGNESIUM: Magnesium: 2.3 mg/dL (ref 1.7–2.4)

## 2016-03-13 MED ORDER — DOFETILIDE 500 MCG PO CAPS: 500.0000 ug | ORAL_CAPSULE | Freq: Two times a day (BID) | ORAL | 3 refills | Status: DC

## 2016-03-13 MED ORDER — DOFETILIDE 500 MCG PO CAPS: 500.0000 ug | ORAL_CAPSULE | Freq: Two times a day (BID) | ORAL | 0 refills | Status: DC

## 2016-03-13 MED ORDER — DOFETILIDE 500 MCG PO CAPS: 500.0000 ug | ORAL_CAPSULE | Freq: Two times a day (BID) | ORAL | 11 refills | Status: DC

## 2016-03-13 NOTE — Discharge Summary (Signed)
ELECTROPHYSIOLOGY PROCEDURE DISCHARGE SUMMARY    Patient ID: Ryan Wise,  MRN: [id number], DOB/AGE: January 11, 1941 75 y.o.  Admit date: 03/10/2016 Discharge date: 03/13/2016  Primary Care Physician: Thayer Headings, MD Primary Cardiologist: [facility] Electrophysiologist: Cheyenne Bordeaux  Primary Discharge Diagnosis:  1.  Persistent atrial fibrillation status post Tikosyn loading this admission  Secondary Discharge Diagnosis:  1.  CAD s/p CABG 2.  ICM 3.  Chronic systolic heart failure 4.  AAA s/p EVAR 5.  HTN 6.  Hyperlipidemia 7.  GERD   Allergies  Allergen Reactions  . Other Other (See Comments)    Pain medication that he was given after open heart surgery-sweating and hallucinations   . Aspirin Nausea And Vomiting    Wise't take full strength uncoated aspirin  . Oxycodone Other (See Comments)    Hallucinations     Procedures This Admission:  1.  Tikosyn loading 2.  Direct current cardioversion on 03/12/16 by Dr Jens Som which successfully restored SR.  There were no early apparent complications.   Brief HPI: Ryan Wise is a 75 y.o. male with a past medical history as noted above.  They were referred to the AF clinic in the outpatient setting for treatment options of atrial fibrillation after failing DCCV off AAD therapy.  Risks, benefits, and alternatives to Tikosyn were reviewed with the patient who wished to proceed.    Hospital Course:  The patient was admitted and Tikosyn was initiated.  Renal function and electrolytes were followed during the hospitalization.  Their QTc remained stable.  On 03/12/16 they underwent direct current cardioversion which restored sinus rhythm.  They were monitored until discharge on telemetry which demonstrated sinus rhythm.  On the day of discharge, they were examined by Dr Johney Frame who considered them stable for discharge to home.  Follow-up has been arranged with AF clinic in 1 week and Dr Rennis Golden in 4 weeks.   He would benefit from  outpatient sleep study evaluation.   Physical Exam: Vitals:   03/12/16 1146 03/12/16 2011 03/13/16 0430 03/13/16 0835  BP: 127/84 135/77 121/67 (!) 152/92  Pulse: 75 67 65 77  Resp: 20 15 16 15   Temp: 97.7 F (36.5 C) 98.3 F (36.8 C) 98.2 F (36.8 C) 98.7 F (37.1 C)  TempSrc: Oral Oral Oral Oral  SpO2: 94% 95% 94% 93%  Weight:   284 lb 9.8 oz (129.1 kg)   Height:        GEN- The patient is obese appearing, alert and oriented x 3 today.   HEENT: normocephalic, atraumatic; sclera clear, conjunctiva pink; hearing intact; oropharynx clear; neck supple  Lungs- Clear to ausculation bilaterally, normal work of breathing.  No wheezes, rales, rhonchi Heart- Regular rate and rhythm  GI- soft, non-tender, non-distended, bowel sounds present  Extremities- no clubbing, cyanosis, or edema; DP/PT/radial pulses 2+ bilaterally MS- no significant deformity or atrophy Skin- warm and dry, no rash or lesion Psych- euthymic mood, full affect Neuro- strength and sensation are intact   Labs:   Lab Results  Component Value Date   WBC 6.2 02/25/2016   HGB 12.1 (L) 02/25/2016   HCT 38.4 (L) 02/25/2016   MCV 85.7 02/25/2016   PLT 195 02/25/2016     Recent Labs Lab 03/13/16 0429  NA 138  K 4.2  CL 105  CO2 27  BUN 16  CREATININE 1.23  CALCIUM 8.8*  GLUCOSE 109*     Discharge Medications:    Medication List    TAKE these medications   acetaminophen  500 MG tablet Commonly known as:  TYLENOL Take 500 mg by mouth every 6 (six) hours as needed for mild pain.   albuterol 108 (90 Base) MCG/ACT inhaler Commonly known as:  PROVENTIL HFA;VENTOLIN HFA Inhale 1 puff into the lungs every 6 (six) hours as needed for wheezing or shortness of breath.   apixaban 5 MG Tabs tablet Commonly known as:  ELIQUIS Take 1 tablet (5 mg total) by mouth 2 (two) times daily.   aspirin 81 MG EC tablet Take 1 tablet (81 mg total) by mouth daily.   atorvastatin 80 MG tablet Commonly known as:   LIPITOR Take 40 mg by mouth daily.   dofetilide 500 MCG capsule Commonly known as:  TIKOSYN Take 1 capsule (500 mcg total) by mouth 2 (two) times daily.   furosemide 20 MG tablet Commonly known as:  LASIX Take 1 tablet (20 mg total) by mouth daily.   isosorbide mononitrate 30 MG 24 hr tablet Commonly known as:  IMDUR Take 1 tablet (30 mg total) by mouth daily.   lisinopril 5 MG tablet Commonly known as:  PRINIVIL,ZESTRIL Take 1 tablet (5 mg total) by mouth daily. Please keep your upcoming appointment (10/15/15) for refills.   LORazepam 0.5 MG tablet Commonly known as:  ATIVAN Take 0.5 mg by mouth 2 (two) times daily.   metFORMIN 750 MG 24 hr tablet Commonly known as:  GLUCOPHAGE-XR Take 1 tablet by mouth daily.   NASACORT ALLERGY 24HR 55 MCG/ACT Aero nasal inhaler Generic drug:  triamcinolone Place 2 sprays into the nose daily as needed (allergies).   nitroGLYCERIN 0.4 MG SL tablet Commonly known as:  NITROSTAT Place 1 tablet (0.4 mg total) under the tongue every 5 (five) minutes as needed for chest pain.   ranitidine 150 MG tablet Commonly known as:  ZANTAC Take 150 mg by mouth 2 (two) times daily.       Disposition:  Discharge Instructions    Diet - low sodium heart healthy    Complete by:  As directed    Increase activity slowly    Complete by:  As directed      Follow-up Information    [facility] Follow up on 03/20/2016.   Specialty:  Cardiology Why:  at [facility] Contact information: [address] [id number] [place] [id number] [phone]       Chrystie NoseKenneth C Hilty, MD Follow up on 04/24/2016.   Specialty:  Cardiology Why:  at [facility] Contact information: [address] [place] [address] [phone]           Duration of Discharge Encounter: Greater than 30 minutes including physician time.  Signed, Gypsy BalsamAmber Seiler, NP 03/13/2016 9:09 AM   I have seen, examined the patient, and  reviewed the above assessment and plan.  On exam, RRR.  Changes to above are made where necessary.  QT is stable.  DC to home.  Routine post tikosyn management.  Co Sign: Hillis RangeJames Elayne Gruver, MD 03/13/2016 9:55 AM

## 2016-03-13 NOTE — Progress Notes (Signed)
Patient received a week supply of Tikosyn through the Tikosyn fund through [facility]. Abelino DerrickB Irl Bodie [facility],MHA,BSN [phone]

## 2016-03-13 NOTE — Progress Notes (Signed)
Orders received for pt discharge.  Discharge summary printed and reviewed with pt.  Explained medication regimen, and pt had no further questions at this time.  [facility], and given to pt.  IV removed and site remains clean, dry, intact.  Telemetry removed.  Pt in stable condition and awaiting transport.

## 2016-03-20 ENCOUNTER — Other Ambulatory Visit (HOSPITAL_COMMUNITY): Payer: Self-pay | Admitting: *Deleted

## 2016-03-20 ENCOUNTER — Ambulatory Visit (HOSPITAL_COMMUNITY)
Admission: RE | Admit: 2016-03-20 | Discharge: 2016-03-20 | Disposition: A | Discharge status: Home / Self Care | Payer: Medicare Other | Source: Ambulatory Visit | Attending: Nurse Practitioner | Admitting: Nurse Practitioner

## 2016-03-20 ENCOUNTER — Encounter (HOSPITAL_COMMUNITY): Payer: Self-pay | Admitting: Nurse Practitioner

## 2016-03-20 VITALS — BP 136/74 | HR 68 | Ht 72.0 in | Wt 292.4 lb

## 2016-03-20 DIAGNOSIS — Z87891 Personal history of nicotine dependence: Secondary | ICD-10-CM | POA: Diagnosis not present

## 2016-03-20 DIAGNOSIS — Z8249 Family history of ischemic heart disease and other diseases of the circulatory system: Secondary | ICD-10-CM | POA: Diagnosis not present

## 2016-03-20 DIAGNOSIS — Z806 Family history of leukemia: Secondary | ICD-10-CM | POA: Insufficient documentation

## 2016-03-20 DIAGNOSIS — Z6839 Body mass index (BMI) 39.0-39.9, adult: Secondary | ICD-10-CM | POA: Diagnosis not present

## 2016-03-20 DIAGNOSIS — Z7984 Long term (current) use of oral hypoglycemic drugs: Secondary | ICD-10-CM | POA: Diagnosis not present

## 2016-03-20 DIAGNOSIS — Z885 Allergy status to narcotic agent status: Secondary | ICD-10-CM | POA: Diagnosis not present

## 2016-03-20 DIAGNOSIS — I252 Old myocardial infarction: Secondary | ICD-10-CM | POA: Insufficient documentation

## 2016-03-20 DIAGNOSIS — Z888 Allergy status to other drugs, medicaments and biological substances status: Secondary | ICD-10-CM | POA: Insufficient documentation

## 2016-03-20 DIAGNOSIS — I481 Persistent atrial fibrillation: Secondary | ICD-10-CM | POA: Diagnosis not present

## 2016-03-20 DIAGNOSIS — I110 Hypertensive heart disease with heart failure: Secondary | ICD-10-CM | POA: Insufficient documentation

## 2016-03-20 DIAGNOSIS — I255 Ischemic cardiomyopathy: Secondary | ICD-10-CM | POA: Diagnosis not present

## 2016-03-20 DIAGNOSIS — I714 Abdominal aortic aneurysm, without rupture: Secondary | ICD-10-CM | POA: Diagnosis not present

## 2016-03-20 DIAGNOSIS — Z886 Allergy status to analgesic agent status: Secondary | ICD-10-CM | POA: Diagnosis not present

## 2016-03-20 DIAGNOSIS — Z9889 Other specified postprocedural states: Secondary | ICD-10-CM | POA: Diagnosis not present

## 2016-03-20 DIAGNOSIS — Z7982 Long term (current) use of aspirin: Secondary | ICD-10-CM | POA: Insufficient documentation

## 2016-03-20 DIAGNOSIS — E669 Obesity, unspecified: Secondary | ICD-10-CM | POA: Diagnosis not present

## 2016-03-20 DIAGNOSIS — Z955 Presence of coronary angioplasty implant and graft: Secondary | ICD-10-CM | POA: Diagnosis not present

## 2016-03-20 DIAGNOSIS — I5042 Chronic combined systolic (congestive) and diastolic (congestive) heart failure: Secondary | ICD-10-CM | POA: Diagnosis not present

## 2016-03-20 DIAGNOSIS — K219 Gastro-esophageal reflux disease without esophagitis: Secondary | ICD-10-CM | POA: Insufficient documentation

## 2016-03-20 DIAGNOSIS — I2510 Atherosclerotic heart disease of native coronary artery without angina pectoris: Secondary | ICD-10-CM | POA: Insufficient documentation

## 2016-03-20 DIAGNOSIS — Z951 Presence of aortocoronary bypass graft: Secondary | ICD-10-CM | POA: Insufficient documentation

## 2016-03-20 DIAGNOSIS — Z7901 Long term (current) use of anticoagulants: Secondary | ICD-10-CM | POA: Insufficient documentation

## 2016-03-20 DIAGNOSIS — M1990 Unspecified osteoarthritis, unspecified site: Secondary | ICD-10-CM | POA: Insufficient documentation

## 2016-03-20 DIAGNOSIS — E781 Pure hyperglyceridemia: Secondary | ICD-10-CM | POA: Insufficient documentation

## 2016-03-20 DIAGNOSIS — Z808 Family history of malignant neoplasm of other organs or systems: Secondary | ICD-10-CM | POA: Insufficient documentation

## 2016-03-20 DIAGNOSIS — I4819 Other persistent atrial fibrillation: Secondary | ICD-10-CM

## 2016-03-20 DIAGNOSIS — E119 Type 2 diabetes mellitus without complications: Secondary | ICD-10-CM | POA: Diagnosis not present

## 2016-03-20 LAB — BASIC METABOLIC PANEL
ANION GAP: 9 (ref 5–15)
BUN: 15 mg/dL (ref 6–20)
CALCIUM: 8.9 mg/dL (ref 8.9–10.3)
CO2: 26 mmol/L (ref 22–32)
Chloride: 103 mmol/L (ref 101–111)
Creatinine, Ser: 1.17 mg/dL (ref 0.61–1.24)
GFR, EST NON AFRICAN AMERICAN: 59 mL/min — AB (ref >60)
Glucose, Bld: 128 mg/dL — ABNORMAL HIGH (ref 65–99)
Potassium: 3.8 mmol/L (ref 3.5–5.1)
SODIUM: 138 mmol/L (ref 135–145)

## 2016-03-20 LAB — MAGNESIUM: MAGNESIUM: 2 mg/dL (ref 1.7–2.4)

## 2016-03-20 MED ORDER — POTASSIUM CHLORIDE CRYS ER 20 MEQ PO TBCR: 20.0000 meq | EXTENDED_RELEASE_TABLET | Freq: Every day | ORAL | 6 refills | Status: DC

## 2016-03-20 NOTE — Progress Notes (Signed)
Primary Care Physician: Thayer Headings, MD Referring Physician: Dr. [facility] f/u   Ryan Wise is a 75 y.o. male with a h/o persistent afib that is in the afib clinic for f/u. He was admitted for tikosyn initiation 10/16 thru 10/19.He had DCCV which was successful. Qtc remained stable. He feels well. Has not realized any irregularity to HR since Tikosyn. He denies snoring, and denies symptoms of daytime somnolence   Today, he denies symptoms of palpitations, chest pain, shortness of breath, orthopnea, PND, lower extremity edema, dizziness, presyncope, syncope, or neurologic sequela. The patient is tolerating medications without difficulties and is otherwise without complaint today.   Past Medical History:  Diagnosis Date  . Aneurysm of infrarenal abdominal aorta (HCC)    a. 01/2016 s/p endovascular repair of AAA (EVAR).  . Arthritis   . Chronic combined systolic and diastolic CHF (congestive heart failure) (HCC)    a. 01/2016 Echo: EF 35-40%, diff HK, mildly dil Ao root, mild MR, mildly dil LA.  Marland Kitchen Coronary artery disease    a. 1999 s/p CABG x4 (LIMA->LAD, VG->OM, VG->Diag, VG->RCA); b. 11/2012 Inferior STEMI/PCI: LIMA->LAD ok, VG->Diag 100, VG->OM 100, VG->RCA was culprit (PTCA/thrombectomy->Xience Xpedition DES), EF 25%.  Marland Kitchen GERD (gastroesophageal reflux disease)   . High triglycerides   . Hypertension   . Ischemic Cardiomyopathy    a. 01/2016 Echo: EF 35-40%, diff HK, inflat, inf AK.  . Obesity   . Persistent atrial fibrillation (HCC)    a. Dx 01/2016-->slow vent response on beta blocker;  b. CHA2DS2VASc = 6-->Eliquis;  c. 03/03/2016 unsuccessful DCCV.  . Ruptured lumbar disc   . ST elevation myocardial infarction (STEMI) of inferior wall (HCC) 12/18/12   STEMI of inf. wall w/PCI with Xperdition stent to VG to distal RCA  . Type II diabetes mellitus (HCC)    Past Surgical History:  Procedure Laterality Date  . BACK SURGERY     (5) back surgeries  . CARDIOVERSION N/A 03/03/2016   Procedure: CARDIOVERSION;  Surgeon: Chrystie Nose, MD;  Location[facility] ENDOSCOPY;  Service: Cardiovascular;  Laterality: N/A;  . CARDIOVERSION N/A 03/12/2016   Procedure: CARDIOVERSION;  Surgeon: Chilton Si, MD;  [facility] ENDOSCOPY;  Service: Cardiovascular;  Laterality: N/A;  . CORONARY ARTERY BYPASS GRAFT  1999   (CAD) CABG was a 5-vessel bypass and had a questionable history of A fib. He underwent monitoring whick showed sinus bradycardia and PACs but no evidence of A fib.  . ENDOVASCULAR STENT INSERTION N/A 12/28/2015   Procedure: ENDOVASCULAR STENT GRAFT INSERTION;  Surgeon: Nada Libman, MD;  Location: [facility] OR;  Service: Vascular;  Laterality: N/A;  . LEFT HEART CATHETERIZATION WITH CORONARY/GRAFT ANGIOGRAM  12/18/2012   Procedure: LEFT HEART CATHETERIZATION WITH Isabel Caprice;  Surgeon: [name], MD;  Location: [facility] CATH LAB;  Service: Cardiovascular;;  . LUMBAR DISC SURGERY    . open heart surgery  03/1998  . TONSILLECTOMY      Current Outpatient Prescriptions  Medication Sig Dispense Refill  . acetaminophen (TYLENOL) 500 MG tablet Take 500 mg by mouth every 6 (six) hours as needed for mild pain.    Marland Kitchen albuterol (PROVENTIL HFA;VENTOLIN HFA) 108 (90 Base) MCG/ACT inhaler Inhale 1 puff into the lungs every 6 (six) hours as needed for wheezing or shortness of breath.    Marland Kitchen apixaban (ELIQUIS) 5 MG TABS tablet Take 1 tablet (5 mg total) by mouth 2 (two) times daily. 180 tablet 3  . aspirin EC 81 MG EC tablet Take 1  tablet (81 mg total) by mouth daily.    Marland Kitchen atorvastatin (LIPITOR) 80 MG tablet Take 40 mg by mouth daily.    Marland Kitchen dofetilide (TIKOSYN) 500 MCG capsule Take 1 capsule (500 mcg total) by mouth 2 (two) times daily. 14 capsule 0  . furosemide (LASIX) 20 MG tablet Take 1 tablet (20 mg total) by mouth daily. 30 tablet 11  . isosorbide mononitrate (IMDUR) 30 MG 24 hr tablet Take 1 tablet (30 mg total) by mouth daily. 30 tablet 11  . lisinopril (PRINIVIL,ZESTRIL) 5 MG  tablet Take 1 tablet (5 mg total) by mouth daily. Please keep your upcoming appointment (10/15/15) for refills. 30 tablet 1  . LORazepam (ATIVAN) 0.5 MG tablet Take 0.5 mg by mouth 2 (two) times daily.     . metFORMIN (GLUCOPHAGE-XR) 750 MG 24 hr tablet Take 1 tablet by mouth daily.  1  . nitroGLYCERIN (NITROSTAT) 0.4 MG SL tablet Place 1 tablet (0.4 mg total) under the tongue every 5 (five) minutes as needed for chest pain. 25 tablet 3  . ranitidine (ZANTAC) 150 MG tablet Take 150 mg by mouth 2 (two) times daily.    Marland Kitchen triamcinolone (NASACORT ALLERGY 24HR) 55 MCG/ACT AERO nasal inhaler Place 2 sprays into the nose daily as needed (allergies).     No current facility-administered medications for this encounter.     Allergies  Allergen Reactions  . Other Other (See Comments)    Pain medication that he was given after open heart surgery-sweating and hallucinations   . Aspirin Nausea And Vomiting    Can't take full strength uncoated aspirin  . Oxycodone Other (See Comments)    Hallucinations    Social History   Social History  . Marital status: Divorced    Spouse name: N/A  . Number of children: 1  . Years of education: N/A   Occupational History  . 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 Smoking status: Former Smoker    Packs/day: 4.50    Years: 30.00    Quit date: 05/07/1987  . Smokeless tobacco: Former Neurosurgeon    Types: Chew  . Alcohol use No  . Drug use: No  . Sexual activity: Not on file   Other Topics Concern  . Not on file   Social History Narrative  . No narrative on file    Family History  Problem Relation Age of Onset  . CAD Mother   . Heart disease Mother   . CAD Brother   . Heart attack Brother   . Hyperlipidemia Sister   . Leukemia Father   . CAD Maternal Grandmother   . Brain cancer Brother   . CAD Sister     ROS- All systems are reviewed and negative except as per the HPI above  Physical Exam: Vitals:   03/20/16 0909    BP: 136/74  Pulse: 68  Weight: 292 lb 6.4 oz (132.6 kg)  Height: 6' (1.829 m)    GEN- The patient is well appearing, alert and oriented x 3 today.   Head- normocephalic, atraumatic Eyes-  Sclera clear, conjunctiva pink Ears- hearing intact Oropharynx- clear Neck- supple, no JVP Lymph- no cervical lymphadenopathy Lungs- Clear to ausculation bilaterally, normal work of breathing Heart- Regular rate and rhythm, no murmurs, rubs or gallops, PMI not laterally displaced GI- soft, NT, ND, + BS Extremities- no clubbing, cyanosis, or edema MS- no significant deformity or atrophy Skin- no rash or lesion Psych-  euthymic mood, full affect Neuro- strength and sensation are intact  EKG-SR with first degree AVB at 68 bpm, pr int 246 ms, qrs int 112 ms, qtc 510 ms(hand calculated at 468 ms) Reviewed with Dr. Johney FrameAllred   Assessment and Plan: 1. Persistent afib Maintaining SR on tikosyn 500 mg bid Tikosyn precautions discussed, taking on a regular basis and drug/drug interactions Continue apixaban 5 mg bid  Continues on asa 81 mg a day, possible due to complex CAD, will defer to Dr. Rennis GoldenHIlty if he feels needs to be continued  bmet/mag  2. CAD s/p CABG Stable  F/u with Dr. Rennis GoldenHilty 11/30 and will require monitoring of tikosyn with EKG, Mag, K+ every 3 months   Lupita LeashDonna C. Matthew Folksarroll, ANP-C Afib Clinic [facility] [address] [place] [address] [phone]

## 2016-03-21 ENCOUNTER — Telehealth (HOSPITAL_COMMUNITY): Payer: Self-pay | Admitting: *Deleted

## 2016-03-21 NOTE — Telephone Encounter (Signed)
Confirmation from [employer] assistance program that patient was approved through their program and product was delivered to [facility] location. Left message with pt that he will receive a call once medication was ready to be picked up. Pt was approved through 03/19/2017.

## 2016-03-22 ENCOUNTER — Other Ambulatory Visit: Payer: Self-pay | Admitting: Internal Medicine

## 2016-03-24 NOTE — Telephone Encounter (Signed)
Rx(s) sent to pharmacy electronically.  

## 2016-03-25 ENCOUNTER — Telehealth: Payer: Self-pay

## 2016-03-25 NOTE — Telephone Encounter (Signed)
Tikosyn 500mcg 3 bottles has arrived from [facility] Patient aware that it will be placed at the front desk.

## 2016-04-02 ENCOUNTER — Ambulatory Visit (HOSPITAL_COMMUNITY)
Admission: RE | Admit: 2016-04-02 | Discharge: 2016-04-02 | Disposition: A | Discharge status: Home / Self Care | Payer: Medicare Other | Source: Ambulatory Visit | Attending: Nurse Practitioner | Admitting: Nurse Practitioner

## 2016-04-02 DIAGNOSIS — I480 Paroxysmal atrial fibrillation: Secondary | ICD-10-CM | POA: Diagnosis not present

## 2016-04-02 DIAGNOSIS — I481 Persistent atrial fibrillation: Secondary | ICD-10-CM | POA: Diagnosis not present

## 2016-04-02 LAB — BASIC METABOLIC PANEL
Anion gap: 5 (ref 5–15)
BUN: 15 mg/dL (ref 6–20)
CO2: 28 mmol/L (ref 22–32)
CREATININE: 1.23 mg/dL (ref 0.61–1.24)
Calcium: 8.7 mg/dL — ABNORMAL LOW (ref 8.9–10.3)
Chloride: 106 mmol/L (ref 101–111)
GFR calc Af Amer: >60 mL/min (ref >60)
GFR, EST NON AFRICAN AMERICAN: 56 mL/min — AB (ref >60)
GLUCOSE: 123 mg/dL — AB (ref 65–99)
Potassium: 3.9 mmol/L (ref 3.5–5.1)
SODIUM: 139 mmol/L (ref 135–145)

## 2016-04-02 LAB — MAGNESIUM: MAGNESIUM: 2.1 mg/dL (ref 1.7–2.4)

## 2016-04-03 ENCOUNTER — Emergency Department (HOSPITAL_COMMUNITY): Payer: Medicare Other

## 2016-04-03 ENCOUNTER — Emergency Department (HOSPITAL_COMMUNITY)
Admission: EM | Admit: 2016-04-03 | Discharge: 2016-04-03 | Disposition: A | Discharge status: Home / Self Care | Payer: Medicare Other | Attending: Emergency Medicine | Admitting: Emergency Medicine

## 2016-04-03 ENCOUNTER — Encounter (HOSPITAL_COMMUNITY): Payer: Self-pay

## 2016-04-03 ENCOUNTER — Telehealth: Payer: Self-pay | Admitting: Internal Medicine

## 2016-04-03 DIAGNOSIS — R0602 Shortness of breath: Secondary | ICD-10-CM | POA: Diagnosis not present

## 2016-04-03 DIAGNOSIS — I252 Old myocardial infarction: Secondary | ICD-10-CM | POA: Diagnosis not present

## 2016-04-03 DIAGNOSIS — Z951 Presence of aortocoronary bypass graft: Secondary | ICD-10-CM | POA: Insufficient documentation

## 2016-04-03 DIAGNOSIS — M546 Pain in thoracic spine: Secondary | ICD-10-CM | POA: Insufficient documentation

## 2016-04-03 DIAGNOSIS — E119 Type 2 diabetes mellitus without complications: Secondary | ICD-10-CM | POA: Insufficient documentation

## 2016-04-03 DIAGNOSIS — I2510 Atherosclerotic heart disease of native coronary artery without angina pectoris: Secondary | ICD-10-CM | POA: Diagnosis not present

## 2016-04-03 DIAGNOSIS — Z7984 Long term (current) use of oral hypoglycemic drugs: Secondary | ICD-10-CM | POA: Insufficient documentation

## 2016-04-03 DIAGNOSIS — Z7901 Long term (current) use of anticoagulants: Secondary | ICD-10-CM | POA: Insufficient documentation

## 2016-04-03 DIAGNOSIS — Z7982 Long term (current) use of aspirin: Secondary | ICD-10-CM | POA: Insufficient documentation

## 2016-04-03 DIAGNOSIS — I5042 Chronic combined systolic (congestive) and diastolic (congestive) heart failure: Secondary | ICD-10-CM | POA: Insufficient documentation

## 2016-04-03 DIAGNOSIS — Z79899 Other long term (current) drug therapy: Secondary | ICD-10-CM | POA: Diagnosis not present

## 2016-04-03 DIAGNOSIS — R0600 Dyspnea, unspecified: Secondary | ICD-10-CM | POA: Diagnosis not present

## 2016-04-03 DIAGNOSIS — Z87891 Personal history of nicotine dependence: Secondary | ICD-10-CM | POA: Insufficient documentation

## 2016-04-03 DIAGNOSIS — I110 Hypertensive heart disease with heart failure: Secondary | ICD-10-CM | POA: Diagnosis not present

## 2016-04-03 DIAGNOSIS — M7918 Myalgia, other site: Secondary | ICD-10-CM

## 2016-04-03 LAB — HEPATIC FUNCTION PANEL
ALT: 18 U/L (ref 17–63)
AST: 24 U/L (ref 15–41)
Albumin: 4 g/dL (ref 3.5–5.0)
Alkaline Phosphatase: 80 U/L (ref 38–126)
BILIRUBIN DIRECT: 0.3 mg/dL (ref 0.1–0.5)
BILIRUBIN INDIRECT: 0.9 mg/dL (ref 0.3–0.9)
TOTAL PROTEIN: 7.4 g/dL (ref 6.5–8.1)
Total Bilirubin: 1.2 mg/dL (ref 0.3–1.2)

## 2016-04-03 LAB — CBC
HEMATOCRIT: 43 % (ref 39.0–52.0)
HEMOGLOBIN: 13.5 g/dL (ref 13.0–17.0)
MCH: 26.9 pg (ref 26.0–34.0)
MCHC: 31.4 g/dL (ref 30.0–36.0)
MCV: 85.8 fL (ref 78.0–100.0)
Platelets: 243 10*3/uL (ref 150–400)
RBC: 5.01 MIL/uL (ref 4.22–5.81)
RDW: 15.6 % — ABNORMAL HIGH (ref 11.5–15.5)
WBC: 10.9 10*3/uL — AB (ref 4.0–10.5)

## 2016-04-03 LAB — I-STAT TROPONIN, ED: TROPONIN I, POC: 0 ng/mL (ref 0.00–0.08)

## 2016-04-03 LAB — BASIC METABOLIC PANEL
ANION GAP: 8 (ref 5–15)
BUN: 14 mg/dL (ref 6–20)
CO2: 29 mmol/L (ref 22–32)
Calcium: 9.3 mg/dL (ref 8.9–10.3)
Chloride: 100 mmol/L — ABNORMAL LOW (ref 101–111)
Creatinine, Ser: 1.18 mg/dL (ref 0.61–1.24)
GFR, EST NON AFRICAN AMERICAN: 59 mL/min — AB (ref >60)
GLUCOSE: 149 mg/dL — AB (ref 65–99)
POTASSIUM: 4 mmol/L (ref 3.5–5.1)
SODIUM: 137 mmol/L (ref 135–145)

## 2016-04-03 LAB — PROTIME-INR
INR: 1.21
PROTHROMBIN TIME: 15.4 s — AB (ref 11.4–15.2)

## 2016-04-03 LAB — LIPASE, BLOOD: LIPASE: 26 U/L (ref 11–51)

## 2016-04-03 MED ORDER — METHOCARBAMOL 500 MG PO TABS: 1000.0000 mg | ORAL_TABLET | Freq: Four times a day (QID) | ORAL | 0 refills | Status: DC

## 2016-04-03 MED ORDER — METHOCARBAMOL 500 MG PO TABS
1000.0000 mg | ORAL_TABLET | Freq: Once | ORAL | Status: AC
  Administered 2016-04-03: 1000 mg via ORAL
  Filled 2016-04-03: qty 2

## 2016-04-03 MED ORDER — IOPAMIDOL (ISOVUE-370) INJECTION 76%
INTRAVENOUS | Status: AC
  Administered 2016-04-03: 100 mL
  Filled 2016-04-03: qty 100

## 2016-04-03 NOTE — Discharge Instructions (Signed)
Please read and follow all provided instructions.  Your diagnoses today include:  1. Acute bilateral thoracic back pain   2. Musculoskeletal pain     Tests performed today include:  Vital signs - see below for your results today  Blood counts and electrolytes  Troponin test for heart muscle damage - no signs of heart attack  Chest x-ray  CT scan of your chest, abdomen, and pelvis that does not show any complications regarding your aneurysm repair or any new problems. No pneumonia.  Medications prescribed:   Robaxin (methocarbamol) - muscle relaxer medication  DO NOT drive or perform any activities that require you to be awake and alert because this medicine can make you drowsy.   Take any prescribed medications only as directed.  Home care instructions:   Follow any educational materials contained in this packet  Please rest, use ice or heat on your back for the next several days  Do not lift, push, pull anything more than 10 pounds for the next week  Follow-up instructions: Please follow-up with your primary care provider in the next 2 days for further evaluation of your symptoms.   Return instructions:  SEEK IMMEDIATE MEDICAL ATTENTION IF YOU HAVE:  New numbness, tingling, weakness, or problem with the use of your arms or legs  Severe back pain not relieved with medications  Loss control of your bowels or bladder  Increasing pain in any areas of the body (such as chest or abdominal pain)  Shortness of breath, dizziness, or fainting.   Worsening nausea (feeling sick to your stomach), vomiting, fever, or sweats  Any other emergent concerns regarding your health   Additional Information:  Your vital signs today were: BP 129/81    Pulse 84    Temp 99.7 F (37.6 C) (Oral)    Resp 16    SpO2 94%  If your blood pressure (BP) was elevated above 135/85 this visit, please have this repeated by your doctor within one month. --------------

## 2016-04-03 NOTE — Telephone Encounter (Signed)
Spoke with daughter she states that pain the was too much for pt and they are on the way to the ER.

## 2016-04-03 NOTE — ED Provider Notes (Signed)
[facility]-EMERGENCY DEPT Provider Note   CSN: [id number] Arrival date & time: 04/03/16  1622     History   Chief Complaint Chief Complaint  Patient presents with  . Chest Pain    HPI Ryan Wise is a 75 y.o. male.  Patient with history of CABG, A. fib status post DC cardioversion on Tikasyn, infrarenal abdominal aorta status post endovascular repair in 12/2015 -- presents with c/o middle back pain, L>R, with radiation between shoulder blades and up to neck -- starting acutely at around 10AM. Pain is constant at a 4 out of 10 level but he has sharp pains that are related to spasms or movements that are severe. No injury. Has been using heat without relief. No CP, abd pain, SOB. Pain is much worse with movement and worse with palpation on left side of back. No palpitations, vomiting, diaphoresis. Pain is not exertional or related to food. The course is constant. Alleviating factors: none.        Past Medical History:  Diagnosis Date  . Aneurysm of infrarenal abdominal aorta (HCC)    a. 01/2016 s/p endovascular repair of AAA (EVAR).  . Arthritis   . Chronic combined systolic and diastolic CHF (congestive heart failure) (HCC)    a. 01/2016 Echo: EF 35-40%, diff HK, mildly dil Ao root, mild MR, mildly dil LA.  Marland Kitchen Coronary artery disease    a. 1999 s/p CABG x4 (LIMA->LAD, VG->OM, VG->Diag, VG->RCA); b. 11/2012 Inferior STEMI/PCI: LIMA->LAD ok, VG->Diag 100, VG->OM 100, VG->RCA was culprit (PTCA/thrombectomy->Xience Xpedition DES), EF 25%.  Marland Kitchen GERD (gastroesophageal reflux disease)   . High triglycerides   . Hypertension   . Ischemic Cardiomyopathy    a. 01/2016 Echo: EF 35-40%, diff HK, inflat, inf AK.  . Obesity   . Persistent atrial fibrillation (HCC)    a. Dx 01/2016-->slow vent response on beta blocker;  b. CHA2DS2VASc = 6-->Eliquis;  c. 03/03/2016 unsuccessful DCCV.  . Ruptured lumbar disc   . ST elevation myocardial infarction (STEMI) of inferior wall (HCC) 12/18/12   STEMI of inf.  wall w/PCI with Xperdition stent to VG to distal RCA  . Type II diabetes mellitus [facility])     Patient Active Problem List   Diagnosis Date Noted  . Other fatigue 02/14/2016  . Persistent atrial fibrillation (HCC) 01/31/2016  . S/P AAA repair 01/31/2016  . AAA (abdominal aortic aneurysm) (HCC) 12/28/2015  . AAA (abdominal aortic aneurysm) without rupture (HCC) 12/27/2015  . Acute gastroenteritis 12/27/2015  . Chronic combined systolic and diastolic congestive heart failure (HCC) 12/27/2015  . DOE (dyspnea on exertion) 01/08/2013  . DJD (degenerative joint disease)- 5 back surgeries 12/22/2012  . Obesity, unspecified 12/22/2012  . Acute renal insufficiency 12/22/2012  . Cardiomyopathy, ischemic, EF by cath 35%  12/21/2012  . Dyslipidemia - high TG, on fibrate 12/20/2012  . Acute combined systolic and diastolic heart failure -  12/20/2012  . Coronary artery disease involving coronary bypass graft of native heart without angina pectoris 12/18/2012  . STEMI of inf. wall with PCI with Xpedition stent to VG to distal RCA; 12/18/12 12/18/2012    Past Surgical History:  Procedure Laterality Date  . BACK SURGERY     (5) back surgeries  . CARDIOVERSION N/A 03/03/2016   Procedure: CARDIOVERSION;  Surgeon: Chrystie Nose, MD;  Location: [facility] ENDOSCOPY;  Service: Cardiovascular;  Laterality: N/A;  . CARDIOVERSION N/A 03/12/2016   Procedure: CARDIOVERSION;  Surgeon: Chilton Si, MD;  Location: [facility] ENDOSCOPY;  Service: Cardiovascular;  Laterality: N/A;  .  CORONARY ARTERY BYPASS GRAFT  1999   (CAD) CABG was a 5-vessel bypass and had a questionable history of A fib. He underwent monitoring whick showed sinus bradycardia and PACs but no evidence of A fib.  . ENDOVASCULAR STENT INSERTION N/A 12/28/2015   Procedure: ENDOVASCULAR STENT GRAFT INSERTION;  Surgeon: Nada Libman, MD;  Location: [facility] OR;  Service: Vascular;  Laterality: N/A;  . LEFT HEART CATHETERIZATION WITH CORONARY/GRAFT ANGIOGRAM   12/18/2012   Procedure: LEFT HEART CATHETERIZATION WITH Isabel Caprice;  Surgeon: [name], MD;  Location: [facility] CATH LAB;  Service: Cardiovascular;;  . LUMBAR DISC SURGERY    . open heart surgery  03/1998  . TONSILLECTOMY         Home Medications    Prior to Admission medications   Medication Sig Start Date End Date Taking? Authorizing Provider  acetaminophen (TYLENOL) 500 MG tablet Take 500 mg by mouth every 6 (six) hours as needed for mild pain.   Yes Historical Provider, MD  albuterol (PROVENTIL HFA;VENTOLIN HFA) 108 (90 Base) MCG/ACT inhaler Inhale 1 puff into the lungs every 6 (six) hours as needed for wheezing or shortness of breath.   Yes Historical Provider, MD  apixaban (ELIQUIS) 5 MG TABS tablet Take 1 tablet (5 mg total) by mouth 2 (two) times daily. 01/31/16  Yes Chrystie Nose, MD  aspirin EC 81 MG EC tablet Take 1 tablet (81 mg total) by mouth daily. 12/22/12  Yes Luke K Kilroy, PA-C  atorvastatin (LIPITOR) 80 MG tablet Take 40 mg by mouth daily.   Yes Historical Provider, MD  dofetilide (TIKOSYN) 500 MCG capsule Take 1 capsule (500 mcg total) by mouth 2 (two) times daily. 03/13/16  Yes Chrystie Nose, MD  furosemide (LASIX) 20 MG tablet Take 1 tablet (20 mg total) by mouth daily. 01/31/16  Yes Chrystie Nose, MD  isosorbide mononitrate (IMDUR) 30 MG 24 hr tablet Take 1 tablet (30 mg total) by mouth daily. 01/31/16  Yes Chrystie Nose, MD  lisinopril (PRINIVIL,ZESTRIL) 5 MG tablet Take 1 tablet (5 mg total) by mouth daily. Please keep your upcoming appointment (10/15/15) for refills. 10/08/15  Yes Chrystie Nose, MD  LORazepam (ATIVAN) 0.5 MG tablet Take 0.5 mg by mouth 2 (two) times daily.  01/07/13  Yes Chrystie Nose, MD  metFORMIN (GLUCOPHAGE-XR) 750 MG 24 hr tablet Take 1 tablet by mouth daily. 11/23/15  Yes Historical Provider, MD  nitroGLYCERIN (NITROSTAT) 0.4 MG SL tablet Place 1 tablet (0.4 mg total) under the tongue every 5 (five) minutes as needed for chest  pain. 12/22/12  Yes Luke K Kilroy, PA-C  potassium chloride SA (K-DUR,KLOR-CON) 20 MEQ tablet Take 1 tablet (20 mEq total) by mouth daily. 03/20/16  Yes Newman Nip, NP  ranitidine (ZANTAC) 150 MG tablet Take 150 mg by mouth 2 (two) times daily.   Yes Historical Provider, MD  lisinopril (PRINIVIL,ZESTRIL) 5 MG tablet Take 1 tablet (5 mg total) by mouth daily. Patient not taking: Reported on 04/03/2016 03/24/16   Chrystie Nose, MD    Family History Family History  Problem Relation Age of Onset  . CAD Mother   . Heart disease Mother   . CAD Brother   . Heart attack Brother   . Hyperlipidemia Sister   . Leukemia Father   . CAD Maternal Grandmother   . Brain cancer Brother   . CAD Sister     Social History Social History  Substance Use Topics  . Smoking  status: Former Smoker    Packs/day: 4.50    Years: 30.00    Quit date: 05/07/1987  . Smokeless tobacco: Former NeurosurgeonUser    Types: Chew  . Alcohol use No     Allergies   Other; Aspirin; and Oxycodone   Review of Systems Review of Systems  Constitutional: Negative for fever.  HENT: Negative for rhinorrhea and sore throat.   Eyes: Negative for redness.  Respiratory: Positive for cough (occasional).   Cardiovascular: Negative for chest pain.  Gastrointestinal: Negative for abdominal pain, diarrhea, nausea and vomiting.  Genitourinary: Negative for dysuria.  Musculoskeletal: Positive for back pain and myalgias.  Skin: Negative for rash.  Neurological: Negative for headaches.     Physical Exam Updated Vital Signs BP (!) 144/102 (BP Location: Right Arm)   Pulse 89   Temp 99.7 F (37.6 C) (Oral)   Resp 20   SpO2 95%   Physical Exam  Constitutional: He appears well-developed and well-nourished.  HENT:  Head: Normocephalic and atraumatic.  Mouth/Throat: Oropharynx is clear and moist.  Eyes: Conjunctivae are normal. Right eye exhibits no discharge. Left eye exhibits no discharge.  Neck: Normal range of motion. Neck  supple.  Cardiovascular: Normal rate, regular rhythm and normal heart sounds.  Exam reveals no friction rub.   No murmur heard. Pulses:      Radial pulses are 2+ on the right side, and 2+ on the left side.  Symmetric upper extremity pulses.   Pulmonary/Chest: Effort normal and breath sounds normal. No respiratory distress. He has no wheezes. He has no rales.  Abdominal: Soft. He exhibits no mass. There is no tenderness. There is no guarding.  Musculoskeletal: He exhibits tenderness.  Mild left thoracic back tenderness to palpation.  Neurological: He is alert.  Skin: Skin is warm and dry.  Psychiatric: He has a normal mood and affect.  Nursing note and vitals reviewed.    ED Treatments / Results  Labs (all labs ordered are listed, but only abnormal results are displayed) Labs Reviewed  BASIC METABOLIC PANEL - Abnormal; Notable for the following:       Result Value   Chloride 100 (*)    Glucose, Bld 149 (*)    GFR calc non Af Amer 59 (*)    All other components within normal limits  CBC - Abnormal; Notable for the following:    WBC 10.9 (*)    RDW 15.6 (*)    All other components within normal limits  PROTIME-INR - Abnormal; Notable for the following:    Prothrombin Time 15.4 (*)    All other components within normal limits  LIPASE, BLOOD  HEPATIC FUNCTION PANEL  I-STAT TROPOININ, ED    EKG  EKG Interpretation None       Radiology Dg Chest 2 View  Result Date: 04/03/2016 CLINICAL DATA:  Sharp upper back pain between the shoulder blades which makes breathing difficult. No known injury. History of ischemic cardiomyopathy and previous MI. History of CABG. EXAM: CHEST  2 VIEW COMPARISON:  Portable chest x-ray on December 28, 2015 FINDINGS: The lungs are well-expanded. The interstitial markings are coarse. There are coarse lung markings in the left lower lobe. There is no pleural effusion. The cardiac silhouette is top-normal in size. The pulmonary vascularity is not engorged.  The sternal wires are intact. There is calcification in the wall of the aortic arch. There is multilevel degenerative disc disease of the thoracic spine. IMPRESSION: Chronic bronchitic changes. Atelectasis or infiltrate in the left lower  lobe. Followup PA and lateral chest X-ray is recommended in 3-4 weeks following trial of antibiotic therapy to ensure resolution and exclude underlying malignancy. Borderline cardiomegaly without evidence of pulmonary edema. Aortic atherosclerosis. Electronically Signed   By: David  SwazilandJordan M.D.   On: 04/03/2016 17:16    Procedures Procedures (including critical care time)  Medications Ordered in ED Medications - No data to display   Initial Impression / Assessment and Plan / ED Course  I have reviewed the triage vital signs and the nursing notes.  Pertinent labs & imaging results that were available during my care of the patient were reviewed by me and considered in my medical decision making (see chart for details).  Clinical Course    Patient seen and examined. Discussed with Dr. Erma HeritageIsaacs who will see. Troponin neg x 1, EKG appears unchanged. Symptoms are very positional in nature. Work-up initiated. Medications ordered.   Vital signs reviewed and are as follows: BP (!) 144/102 (BP Location: Right Arm)   Pulse 89   Temp 99.7 F (37.6 C) (Oral)   Resp 20   SpO2 95%   9:34 PM Reviewed findings with Dr. Erma HeritageIsaacs. Endoleak seen is stable. Otherwise no acute findings. LLL infiltrate appears to be atelectasis.  Patient and daughter updated on results. Will treat as musculoskeletal pain at this point. Patient appears comfortable. Will give dose of Robaxin here and prescription for home. Ensured that this medication does not interfere with Tikosyn.  Encouraged PCP follow-up in the next 2-3 days for recheck. Encourage no heavy lifting, pushing, pulling. Return to the emergency department with worsening symptoms, fever, shortness of breath, difficulty breathing or  new symptoms.  Final Clinical Impressions(s) / ED Diagnoses   Final diagnoses:  Acute bilateral thoracic back pain  Musculoskeletal pain   Patient with middle and upper back pain, much worse with movement and somewhat with palpation. Feel this is most likely musculoskeletal in nature. CT and imaging undertaken tonight given patient's past history of AAA repair. There is no evidence of dissection or complication with his endograft. Remainder of workup including EKG and labs are reassuring. Will treat symptomatically.   New Prescriptions New Prescriptions   METHOCARBAMOL (ROBAXIN) 500 MG TABLET    Take 2 tablets (1,000 mg total) by mouth 4 (four) times daily.     Renne CriglerJoshua Nerida Boivin, PA-C 04/03/16 [id number]    Shaune Pollackameron Isaacs, MD 04/05/16 [phone]

## 2016-04-03 NOTE — Telephone Encounter (Signed)
Ryan Wise ( daughter) is calling because [name] is having pains from the middle of shoulder blades going up both sides of his neck. He is not sure if its heart related , because he just came out of [facility] 2wks ago. Please Call  Thanks

## 2016-04-03 NOTE — ED Triage Notes (Signed)
Pt reports pain to middle of shoulder blades, top of shoulders and up his neck. He denies actual chest pain but feels SOB "when the sharp pains hit me."

## 2016-04-04 ENCOUNTER — Other Ambulatory Visit (HOSPITAL_COMMUNITY): Payer: Self-pay | Admitting: *Deleted

## 2016-04-04 MED ORDER — POTASSIUM CHLORIDE CRYS ER 20 MEQ PO TBCR: 30.0000 meq | EXTENDED_RELEASE_TABLET | Freq: Every day | ORAL | 6 refills | Status: DC

## 2016-04-15 ENCOUNTER — Encounter: Payer: Self-pay | Admitting: Internal Medicine

## 2016-04-15 ENCOUNTER — Ambulatory Visit (INDEPENDENT_AMBULATORY_CARE_PROVIDER_SITE_OTHER): Payer: Medicare Other | Admitting: Internal Medicine

## 2016-04-15 VITALS — BP 120/78 | HR 78 | Ht 72.0 in | Wt 290.2 lb

## 2016-04-15 DIAGNOSIS — Z79899 Other long term (current) drug therapy: Secondary | ICD-10-CM

## 2016-04-15 DIAGNOSIS — Z5181 Encounter for therapeutic drug level monitoring: Secondary | ICD-10-CM | POA: Insufficient documentation

## 2016-04-15 DIAGNOSIS — I481 Persistent atrial fibrillation: Secondary | ICD-10-CM

## 2016-04-15 DIAGNOSIS — R5383 Other fatigue: Secondary | ICD-10-CM | POA: Diagnosis not present

## 2016-04-15 DIAGNOSIS — I255 Ischemic cardiomyopathy: Secondary | ICD-10-CM | POA: Diagnosis not present

## 2016-04-15 DIAGNOSIS — I25810 Atherosclerosis of coronary artery bypass graft(s) without angina pectoris: Secondary | ICD-10-CM | POA: Diagnosis not present

## 2016-04-15 DIAGNOSIS — I4819 Other persistent atrial fibrillation: Secondary | ICD-10-CM

## 2016-04-15 NOTE — Progress Notes (Signed)
OFFICE NOTE  Chief Complaint:  Follow-up hospitalization  Primary Care Physician: Thayer HeadingsMACKENZIE,BRIAN, MD  HPI:  Ryan Wise is 75 y/o with a history of CAD, s/p CABG X 4 in 1999 with an LIMA-LAD, SVG-OM, SVG-Dx, SVG-RCA. He [facility] with a STEMI and was taken to the cath lab by Dr Tresa EndoKelly. This revealed the LIMA to the LAD to be patent with collaterals to the Dx. The SVG- OM: was occluded and the SVG- DX: was occluded The culprit appeared to be occlusion of SVG to distal RCA with no flow proximal due to anastomis occlusion with thrombus. He underwent a very difficult PCI with PTCA/ thrombectomy. He had no flow phenomenon requiring IC/IV NTG/IC verapamil/ angiomax, brilinta 180 mg, integrelin. He ultimately received a Xience Xpedition stent to distal anastomosis. He did have acute, transient CHF but stabilized with diuresis. He did well and was transferred to telemetry on 12/21/12.. Troponin was greater than 20. EF at cath was 35%. EF by echo was 35=40% with severe hypokinesis of the basal inferolateral myocardium; moderate hypokinesis of the basal-mid inferior and mid inferolateral myocardium; and mild hypokinesis of the basal-mid inferoseptal and apical septal myocardium. After he was transferred to telemetry he awaked early in the morning of the 30th with neck pain. He was transferred back to the ICU by the cardiology fellow on call. The pt was seen and examined by Dr Rennis GoldenHilty the morning of the 29th. Dr Rennis GoldenHilty did not feel his symptoms were cardiac. We added a NSAID and Skelaxin. He took this for a day or 2 he reported his neck pain improved significantly.  Today is here for followup and appears to be doing fairly well. He does report some shortness of breath doing moderate to more intense activities which he is trying to avoid. He did some lawn mowing which made her short of breath after a while. 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 He does feel that his  energy level has improved significantly and hopefully this means that his EF has come up as well. He continues to take naproxen on a daily basis, however it was intended that he only take this for a short period time for his neck pain. He did however discontinue Skelaxin. I reviewed recent laboratory work from his primary care provider today including a cholesterol profile. I was surprised to see how low that was, noting that his total cholesterol was 80, triglycerides 107 HDL 35 and LDL of 24. This is on atorvastatin 80 mg daily.  I saw Ryan Wise back in the office today. He continues to do very well. He is asymptomatic this had no significant weight gain or worsening shortness of breath. He denies any extremity edema. Blood pressure is very well-controlled today 122/60. He recently had laboratory work through his primary care provider which shows excellent cholesterol control. He does have a history of cardiomyopathy and is due for repeat echocardiogram.   Ryan Wise returns today for follow-up. Overall he is doing exceedingly well. He denies any worsening chest pain or shortness of breath. He's done well on his current medicines. Cholesterol is been well controlled. 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 Recent echocardiogram earlier this year shows an improvement in EF up to 45%.  01/31/2016  Ryan Wise returns today for follow-up. Unfortunately recently presented to the emergency department with abdominal pain, nausea and vomiting as well as diarrhea and was thought to have a gastroenteritis.  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 Of note, he had an ultrasound of the abdomen in April 2016 which the radiologist commented that "the abdominal aorta is poorly seen due to overlying bowel gas". Subsequently he underwent EVAR which is complicated by a small type II endoleak. Since surgery Ryan Wise is felt somewhat fatigued and  does get short of breath with some minimal exertion. In the office today he was noted to be in atrial fibrillation with a slow ventricular response at 47. This is a new diagnosis for him. He is on low-dose metoprolol, given his history of cardiomyopathy. He only takes aspirin for his current anticoagulation. 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  02/14/2016  Ryan Wise returns today for follow-up. He has done well with discontinuing his beta blocker. Heart rate is now improved up to 58. He remains in atrial fibrillation. He does not feel quite as fatigued but does notice some fatigue. He seems to be tolerating Eliquis without any bleeding problems. I did review his recent endoleak with Dr. Myra Gianotti who felt it was okay to start Eliquis in the setting of recent EVAR. At this point the next step would be to try to obtain a normal sinus rhythm. He will need to be anticoagulated for at least 3-4 weeks prior to cardioversion.  04/15/2016  Ryan Wise returns today for follow-up. He underwent outpatient cardioversion by myself which was unsuccessful. I therefore arranged for him to go on Tikosyn therapy. He was admitted and loaded on 500 g twice daily of Tikosyn and required cardioversion to get to sinus rhythm. This was successful and he has maintained sinus rhythm since then. He was discharged and followed up in the A. fib clinic. He was noted to have some hypokalemia and is due for repeat check of his potassium and magnesium. It was recommended that he have EKGs every 3 months and repeat of potassium and magnesium as per prescribing guidelines. He recently was seen in the emergency department for a "stabbing" mid upper back pain. This was felt to be reproducible and musculoskeletal. He did have a CT scan which ruled out dissection. He was given muscle relaxants and noted improvement with that and Tylenol.  PMHx:  Past Medical History:  Diagnosis Date  . Aneurysm of  infrarenal abdominal aorta (HCC)    a. 01/2016 s/p endovascular repair of AAA (EVAR).  . Arthritis   . Chronic combined systolic and diastolic CHF (congestive heart failure) (HCC)    a. 01/2016 Echo: EF 35-40%, diff HK, mildly dil Ao root, mild MR, mildly dil LA.  Marland Kitchen Coronary artery disease    a. 1999 s/p CABG x4 (LIMA->LAD, VG->OM, VG->Diag, VG->RCA); b. 11/2012 Inferior STEMI/PCI: LIMA->LAD ok, VG->Diag 100, VG->OM 100, VG->RCA was culprit (PTCA/thrombectomy->Xience Xpedition DES), EF 25%.  Marland Kitchen GERD (gastroesophageal reflux disease)   . High triglycerides   . Hypertension   . Ischemic Cardiomyopathy    a. 01/2016 Echo: EF 35-40%, diff HK, inflat, inf AK.  . Obesity   . Persistent atrial fibrillation (HCC)    a. Dx 01/2016-->slow vent response on beta blocker;  b. CHA2DS2VASc = 6-->Eliquis;  c. 03/03/2016 unsuccessful DCCV.  . Ruptured lumbar disc   . ST elevation myocardial infarction (STEMI) of inferior wall (HCC) 12/18/12   STEMI of inf. wall w/PCI with Xperdition stent to VG to distal RCA  . Type II diabetes mellitus (HCC)     Past Surgical History:  Procedure Laterality Date  . BACK SURGERY     (5) back surgeries  . CARDIOVERSION N/A 03/03/2016   Procedure: CARDIOVERSION;  Surgeon: Chrystie Nose, MD;  Location[facility] ENDOSCOPY;  Service: Cardiovascular;  Laterality: N/A;  . CARDIOVERSION N/A 03/12/2016   Procedure: CARDIOVERSION;  Surgeon: Chilton Si, MD;  Location: [facility] ENDOSCOPY;  Service: Cardiovascular;  Laterality: N/A;  . CORONARY ARTERY BYPASS GRAFT  1999   (CAD) CABG was a 5-vessel bypass and had a questionable history of A fib. He underwent monitoring whick showed sinus bradycardia and PACs but no evidence of A fib.  . ENDOVASCULAR STENT INSERTION N/A 12/28/2015   Procedure: ENDOVASCULAR STENT GRAFT INSERTION;  Surgeon: Nada Libman, MD;  Location: [facility] OR;  Service: Vascular;  Laterality: N/A;  . LEFT HEART CATHETERIZATION WITH CORONARY/GRAFT ANGIOGRAM  12/18/2012   Procedure:  LEFT HEART CATHETERIZATION WITH Isabel Caprice;  Surgeon: [name], MD;  Location: [facility] CATH LAB;  Service: Cardiovascular;;  . LUMBAR DISC SURGERY    . open heart surgery  03/1998  . TONSILLECTOMY      FAMHx:  Family History  Problem Relation Age of Onset  . CAD Mother   . Heart disease Mother   . CAD Brother   . Heart attack Brother   . Hyperlipidemia Sister   . Leukemia Father   . CAD Maternal Grandmother   . Brain cancer Brother   . CAD Sister     SOCHx:   reports that he quit smoking about 28 years ago. He has a 135.00 pack-year smoking history. He has quit using smokeless tobacco. His smokeless tobacco use included Chew. He reports that he does not drink alcohol or use drugs.  ALLERGIES:  Allergies  Allergen Reactions  . Other Other (See Comments)    Pain medication that he was given after open heart surgery-sweating and hallucinations   . Aspirin Nausea And Vomiting    Can't take full strength uncoated aspirin  . Oxycodone Other (See Comments)    Hallucinations    ROS: Pertinent items noted in HPI and remainder of comprehensive ROS otherwise negative.  HOME MEDS: Current Outpatient Prescriptions  Medication Sig Dispense Refill  . acetaminophen (TYLENOL) 500 MG tablet Take 500 mg by mouth every 6 (six) hours as needed for mild pain.    Marland Kitchen albuterol (PROVENTIL HFA;VENTOLIN HFA) 108 (90 Base) MCG/ACT inhaler Inhale 1 puff into the lungs every 6 (six) hours as needed for wheezing or shortness of breath.    Marland Kitchen apixaban (ELIQUIS) 5 MG TABS tablet Take 1 tablet (5 mg total) by mouth 2 (two) times daily. 180 tablet 3  . aspirin EC 81 MG EC tablet Take 1 tablet (81 mg total) by mouth daily.    Marland Kitchen atorvastatin (LIPITOR) 80 MG tablet Take 40 mg by mouth daily.    Marland Kitchen dofetilide (TIKOSYN) 500 MCG capsule Take 1 capsule (500 mcg total) by mouth 2 (two) times daily. 14 capsule 0  . furosemide (LASIX) 20 MG tablet Take 1 tablet (20 mg total) by mouth daily. 30 tablet 11   . isosorbide mononitrate (IMDUR) 30 MG 24 hr tablet Take 1 tablet (30 mg total) by mouth daily. 30 tablet 11  . lisinopril (PRINIVIL,ZESTRIL) 5 MG tablet Take 1 tablet (5 mg total) by mouth daily. Please keep your upcoming appointment (10/15/15) for refills. 30 tablet 1  . LORazepam (ATIVAN) 0.5 MG tablet Take 0.5 mg by mouth 2 (two) times daily.     . metFORMIN (GLUCOPHAGE-XR) 750 MG  24 hr tablet Take 1 tablet by mouth daily.  1  . nitroGLYCERIN (NITROSTAT) 0.4 MG SL tablet Place 1 tablet (0.4 mg total) under the tongue every 5 (five) minutes as needed for chest pain. 25 tablet 3  . potassium chloride SA (K-DUR,KLOR-CON) 20 MEQ tablet Take 1.5 tablets (30 mEq total) by mouth daily. 45 tablet 6  . ranitidine (ZANTAC) 150 MG tablet Take 150 mg by mouth 2 (two) times daily.     No current facility-administered medications for this visit.     LABS/IMAGING: No results found for this or any previous visit (from the past 48 hour(s)). No results found.  VITALS: BP 120/78   Pulse 78   Ht 6' (1.829 m)   Wt 290 lb 3.2 oz (131.6 kg)   SpO2 94%   BMI 39.36 kg/m   EXAM: Deferred  EKG: Deferred  ASSESSMENT: 1. New onset atrial fibrillation with slow ventricular response-CHADSVASC score of 5 2. Chronic Tikosyn therapy 3. Recent AAA status post -EVAR with type II endoleak 4. Coronary artery disease status post CABG with ST elevation MI and stent placement to the vein graft to PDA, with occluded grafts to the OM and diagonal vessels and a patent LIMA to LAD 5. Ischemic cardiac myopathy EF 45% 6. Hypertension 7. Dyslipidemia  PLAN: 1.   Ryan Wise is maintaining sinus rhythm on Tikosyn. He will need 3 month EKGs and repeat checks of potassium and magnesium. We will recheck potassium today and magnesium and replete as necessary. He may need additional checks prior to follow-up in 3 months. He will need a follow-up echo in 6 months to see if he's had any improvement in LV function.  Chrystie NoseKenneth C.  Lonisha Bobby, MD, [facility] Attending Cardiologist [facility]  Chrystie NoseKenneth C Mekhi Sonn 04/15/2016, 5:17 PM

## 2016-04-15 NOTE — Patient Instructions (Addendum)
Medication Instructions:  Your physician recommends that you continue on your current medications as directed. Please refer to the Current Medication list given to you today.  Labwork: Your physician recommends that you return for lab work in: TOMORROW-BMET, MAG  Your physician recommends that you return for lab work in: 3 MONTHS-BMET, MAG  Testing/Procedures: NONE  Follow-Up: Your physician recommends that you schedule a follow-up appointment in: 3 MONTHS WITH DR HILTY WITH LABS   Any Other Special Instructions Will Be Listed Below (If Applicable).  COMPLETE LABS IN 3 MONTHS 2-3 DAYS BEFORE YOUR NEXT APPOINTMENT WITH DR HILTY.   If you need a refill on your cardiac medications before your next appointment, please call your pharmacy.

## 2016-04-17 LAB — BASIC METABOLIC PANEL
BUN: 20 mg/dL (ref 7–25)
CALCIUM: 8.5 mg/dL — AB (ref 8.6–10.3)
CHLORIDE: 105 mmol/L (ref 98–110)
CO2: 24 mmol/L (ref 20–31)
CREATININE: 1.29 mg/dL — AB (ref 0.70–1.18)
GLUCOSE: 148 mg/dL — AB (ref 65–99)
Potassium: 4.3 mmol/L (ref 3.5–5.3)
Sodium: 140 mmol/L (ref 135–146)

## 2016-04-17 LAB — MAGNESIUM: Magnesium: 1.9 mg/dL (ref 1.5–2.5)

## 2016-04-22 ENCOUNTER — Encounter: Payer: Self-pay | Admitting: *Deleted

## 2016-04-22 ENCOUNTER — Telehealth: Payer: Self-pay | Admitting: *Deleted

## 2016-04-22 DIAGNOSIS — I4891 Unspecified atrial fibrillation: Secondary | ICD-10-CM

## 2016-04-22 MED ORDER — MAGNESIUM 400 MG PO CAPS: 400.0000 mg | ORAL_CAPSULE | Freq: Every day | ORAL | Status: DC

## 2016-04-22 NOTE — Telephone Encounter (Signed)
-----   Message from Chrystie NoseKenneth C Hilty, MD sent at 04/17/2016  8:10 AM EST ----- On Tikosyn. Potassium 4.3 (goal >4). Magnesium 1.9 (goal >2). Start magnesium oxide 400 mg daily. Recheck BMET and magnesium in 2 weeks.  Dr. HRexene Edison

## 2016-04-22 NOTE — Telephone Encounter (Signed)
Spoke with pt, he voiced understanding of new medications. Lab orders mailed to the pt

## 2016-04-22 NOTE — Telephone Encounter (Signed)
Left message for pt to call.

## 2016-04-22 NOTE — Telephone Encounter (Signed)
This encounter was created in error - please disregard.

## 2016-04-24 ENCOUNTER — Ambulatory Visit: Payer: Medicare Other | Admitting: Internal Medicine

## 2016-04-30 ENCOUNTER — Other Ambulatory Visit: Payer: Self-pay | Admitting: Internal Medicine

## 2016-05-01 ENCOUNTER — Other Ambulatory Visit: Payer: Self-pay | Admitting: *Deleted

## 2016-05-01 MED ORDER — APIXABAN 5 MG PO TABS
5.0000 mg | ORAL_TABLET | Freq: Two times a day (BID) | ORAL | 3 refills | Status: DC
Start: 2016-05-01 — End: 2018-04-09

## 2016-05-06 DIAGNOSIS — I4891 Unspecified atrial fibrillation: Secondary | ICD-10-CM | POA: Diagnosis not present

## 2016-05-07 ENCOUNTER — Telehealth: Payer: Self-pay | Admitting: Internal Medicine

## 2016-05-07 LAB — BASIC METABOLIC PANEL
BUN: 18 mg/dL (ref 7–25)
CO2: 26 mmol/L (ref 20–31)
Calcium: 8.7 mg/dL (ref 8.6–10.3)
Chloride: 104 mmol/L (ref 98–110)
Creat: 1.37 mg/dL — ABNORMAL HIGH (ref 0.70–1.18)
Glucose, Bld: 146 mg/dL — ABNORMAL HIGH (ref 65–99)
Potassium: 4.6 mmol/L (ref 3.5–5.3)
Sodium: 139 mmol/L (ref 135–146)

## 2016-05-07 LAB — MAGNESIUM: Magnesium: 1.9 mg/dL (ref 1.5–2.5)

## 2016-05-07 NOTE — Telephone Encounter (Signed)
Faxed MD portion of [employer] patient assistance application & printed Rx to [phone] (phone: [phone])  Per fax received, the application was mailed to patient - for completion of patient section.

## 2016-05-20 NOTE — Telephone Encounter (Signed)
[employer] has received the application and will let us know after review.

## 2016-05-23 DIAGNOSIS — I129 Hypertensive chronic kidney disease with stage 1 through stage 4 chronic kidney disease, or unspecified chronic kidney disease: Secondary | ICD-10-CM | POA: Diagnosis not present

## 2016-05-23 DIAGNOSIS — E559 Vitamin D deficiency, unspecified: Secondary | ICD-10-CM | POA: Diagnosis not present

## 2016-05-23 DIAGNOSIS — Z0000 Encounter for general adult medical examination without abnormal findings: Secondary | ICD-10-CM | POA: Diagnosis not present

## 2016-05-23 DIAGNOSIS — E119 Type 2 diabetes mellitus without complications: Secondary | ICD-10-CM | POA: Diagnosis not present

## 2016-05-23 DIAGNOSIS — I10 Essential (primary) hypertension: Secondary | ICD-10-CM | POA: Diagnosis not present

## 2016-05-23 DIAGNOSIS — Z125 Encounter for screening for malignant neoplasm of prostate: Secondary | ICD-10-CM | POA: Diagnosis not present

## 2016-05-29 DIAGNOSIS — H2513 Age-related nuclear cataract, bilateral: Secondary | ICD-10-CM | POA: Diagnosis not present

## 2016-05-29 DIAGNOSIS — H35411 Lattice degeneration of retina, right eye: Secondary | ICD-10-CM | POA: Diagnosis not present

## 2016-05-29 DIAGNOSIS — H25013 Cortical age-related cataract, bilateral: Secondary | ICD-10-CM | POA: Diagnosis not present

## 2016-05-29 DIAGNOSIS — H353131 Nonexudative age-related macular degeneration, bilateral, early dry stage: Secondary | ICD-10-CM | POA: Diagnosis not present

## 2016-05-29 DIAGNOSIS — H35431 Paving stone degeneration of retina, right eye: Secondary | ICD-10-CM | POA: Diagnosis not present

## 2016-05-29 LAB — HM DIABETES EYE EXAM

## 2016-05-30 ENCOUNTER — Telehealth: Payer: Self-pay | Admitting: Internal Medicine

## 2016-05-30 DIAGNOSIS — I5032 Chronic diastolic (congestive) heart failure: Secondary | ICD-10-CM | POA: Diagnosis not present

## 2016-05-30 DIAGNOSIS — I2510 Atherosclerotic heart disease of native coronary artery without angina pectoris: Secondary | ICD-10-CM | POA: Diagnosis not present

## 2016-05-30 DIAGNOSIS — I129 Hypertensive chronic kidney disease with stage 1 through stage 4 chronic kidney disease, or unspecified chronic kidney disease: Secondary | ICD-10-CM | POA: Diagnosis not present

## 2016-05-30 DIAGNOSIS — E1122 Type 2 diabetes mellitus with diabetic chronic kidney disease: Secondary | ICD-10-CM | POA: Diagnosis not present

## 2016-05-30 NOTE — Telephone Encounter (Signed)
Received notes from Dr Wynona LunaSteven Bernstorf for appointment on 07/17/16 with Dr Rennis GoldenHilty.  Records put with Dr Blanchie DessertHilty's schedule for 07/17/16. lp

## 2016-06-04 ENCOUNTER — Encounter: Payer: Self-pay | Admitting: Internal Medicine

## 2016-06-23 ENCOUNTER — Telehealth (HOSPITAL_COMMUNITY): Payer: Self-pay | Admitting: *Deleted

## 2016-06-23 NOTE — Telephone Encounter (Signed)
Ordered placed for tikosyn refill through patient assistance. Will take 7-10 business days to arrive.   ID for patient is [id number]  Order Number [id number]

## 2016-06-30 DIAGNOSIS — H02831 Dermatochalasis of right upper eyelid: Secondary | ICD-10-CM | POA: Diagnosis not present

## 2016-06-30 DIAGNOSIS — H35371 Puckering of macula, right eye: Secondary | ICD-10-CM | POA: Diagnosis not present

## 2016-06-30 DIAGNOSIS — H02834 Dermatochalasis of left upper eyelid: Secondary | ICD-10-CM | POA: Diagnosis not present

## 2016-06-30 DIAGNOSIS — E119 Type 2 diabetes mellitus without complications: Secondary | ICD-10-CM | POA: Diagnosis not present

## 2016-06-30 DIAGNOSIS — H25813 Combined forms of age-related cataract, bilateral: Secondary | ICD-10-CM | POA: Diagnosis not present

## 2016-07-01 NOTE — Telephone Encounter (Signed)
Spoke to patient and informed [facility] Patient states understanding and appreciation.

## 2016-07-02 DIAGNOSIS — J019 Acute sinusitis, unspecified: Secondary | ICD-10-CM | POA: Diagnosis not present

## 2016-07-04 ENCOUNTER — Telehealth: Payer: Self-pay | Admitting: Internal Medicine

## 2016-07-04 NOTE — Telephone Encounter (Signed)
Received fax that [employer] has approved patient's application. Patient can get Eliquis free of charge 07/03/2016 - 05/25/2017  Application Case: [id number]

## 2016-07-10 DIAGNOSIS — H268 Other specified cataract: Secondary | ICD-10-CM | POA: Diagnosis not present

## 2016-07-10 DIAGNOSIS — H2511 Age-related nuclear cataract, right eye: Secondary | ICD-10-CM | POA: Diagnosis not present

## 2016-07-10 DIAGNOSIS — H25811 Combined forms of age-related cataract, right eye: Secondary | ICD-10-CM | POA: Diagnosis not present

## 2016-07-10 HISTORY — PX: CATARACT EXTRACTION, BILATERAL: SHX1313

## 2016-07-17 ENCOUNTER — Ambulatory Visit: Payer: Medicare Other | Admitting: Internal Medicine

## 2016-07-21 DIAGNOSIS — H2512 Age-related nuclear cataract, left eye: Secondary | ICD-10-CM | POA: Diagnosis not present

## 2016-07-22 ENCOUNTER — Encounter: Payer: Self-pay | Admitting: Surgery

## 2016-07-24 ENCOUNTER — Other Ambulatory Visit: Payer: Self-pay | Admitting: *Deleted

## 2016-07-24 DIAGNOSIS — Z48812 Encounter for surgical aftercare following surgery on the circulatory system: Secondary | ICD-10-CM

## 2016-07-24 DIAGNOSIS — H2512 Age-related nuclear cataract, left eye: Secondary | ICD-10-CM | POA: Diagnosis not present

## 2016-07-24 DIAGNOSIS — H25012 Cortical age-related cataract, left eye: Secondary | ICD-10-CM | POA: Diagnosis not present

## 2016-07-24 DIAGNOSIS — I716 Thoracoabdominal aortic aneurysm, without rupture, unspecified: Secondary | ICD-10-CM

## 2016-07-24 DIAGNOSIS — H25812 Combined forms of age-related cataract, left eye: Secondary | ICD-10-CM | POA: Diagnosis not present

## 2016-07-28 ENCOUNTER — Ambulatory Visit
Admission: RE | Admit: 2016-07-28 | Discharge: 2016-07-28 | Disposition: A | Discharge status: Home / Self Care | Payer: Medicare Other | Source: Ambulatory Visit | Attending: Surgery | Admitting: Surgery

## 2016-07-28 ENCOUNTER — Encounter: Payer: Self-pay | Admitting: Surgery

## 2016-07-28 ENCOUNTER — Ambulatory Visit (INDEPENDENT_AMBULATORY_CARE_PROVIDER_SITE_OTHER): Payer: Medicare Other | Admitting: Surgery

## 2016-07-28 VITALS — BP 134/79 | HR 77 | Temp 97.9°F | Resp 20 | Ht 71.5 in | Wt 288.0 lb

## 2016-07-28 DIAGNOSIS — I716 Thoracoabdominal aortic aneurysm, without rupture, unspecified: Secondary | ICD-10-CM

## 2016-07-28 DIAGNOSIS — I714 Abdominal aortic aneurysm, without rupture, unspecified: Secondary | ICD-10-CM

## 2016-07-28 DIAGNOSIS — Z95828 Presence of other vascular implants and grafts: Secondary | ICD-10-CM | POA: Diagnosis not present

## 2016-07-28 DIAGNOSIS — Z48812 Encounter for surgical aftercare following surgery on the circulatory system: Secondary | ICD-10-CM | POA: Diagnosis not present

## 2016-07-28 MED ORDER — IOPAMIDOL (ISOVUE-370) INJECTION 76%
75.0000 mL | Freq: Once | INTRAVENOUS | Status: AC | PRN
  Administered 2016-07-28: 75 mL via INTRAVENOUS

## 2016-07-28 NOTE — Progress Notes (Signed)
Vitals:   07/28/16 1158 07/28/16 1205  BP: (!) 142/81 134/79  Pulse: 77   Resp: 20   Temp: 97.9 F (36.6 C)   TempSrc: Oral   SpO2: 95%   Weight: 288 lb (130.6 kg)   Height: 5' 11.5" (1.816 m)

## 2016-07-28 NOTE — Progress Notes (Signed)
Vascular and Vein Specialist of [facility]  Patient name: Ryan Wise MRN: [id number] DOB: 02/14/1941 Sex: male   REASON FOR VISIT:    Follow up AAA  HISOTRY OF PRESENT ILLNESS:   Ryan Wise is a 76 y.o. male returns today for follow-up.  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  His postoperative course was uncomplicated.  Ryan Wise was also detected on CT scan which we are following.  The patient reports no interval changes.  He denies any abdominal pain or back pain.  He denies claudication symptoms.   PAST MEDICAL HISTORY:   Past Medical History:  Diagnosis Date  . Aneurysm of infrarenal abdominal aorta (HCC)    a. 01/2016 s/p endovascular repair of AAA (EVAR).  . Arthritis   . Chronic combined systolic and diastolic CHF (congestive heart failure) (HCC)    a. 01/2016 Echo: EF 35-40%, diff HK, mildly dil Ao root, mild MR, mildly dil LA.  Marland Kitchen Coronary artery disease    a. 1999 s/p CABG x4 (LIMA->LAD, VG->OM, VG->Diag, VG->RCA); b. 11/2012 Inferior STEMI/PCI: LIMA->LAD ok, VG->Diag 100, VG->OM 100, VG->RCA was culprit (PTCA/thrombectomy->Xience Xpedition DES), EF 25%.  Marland Kitchen GERD (gastroesophageal reflux disease)   . High triglycerides   . Hypertension   . Ischemic Cardiomyopathy    a. 01/2016 Echo: EF 35-40%, diff HK, inflat, inf AK.  . Obesity   . Persistent atrial fibrillation (HCC)    a. Dx 01/2016-->slow vent response on beta blocker;  b. CHA2DS2VASc = 6-->Eliquis;  c. 03/03/2016 unsuccessful DCCV.  . Ruptured lumbar disc   . ST elevation myocardial infarction (STEMI) of inferior wall (HCC) 12/18/12   STEMI of inf. wall w/PCI with Xperdition stent to VG to distal RCA  . Type II diabetes mellitus (HCC)      FAMILY HISTORY:   Family History  Problem Relation Age of Onset   . CAD Mother   . Heart disease Mother   . CAD Brother   . Heart attack Brother   . Hyperlipidemia Sister   . Leukemia Father   . CAD Maternal Grandmother   . Brain cancer Brother   . CAD Sister     SOCIAL HISTORY:   Social History  Substance Use Topics  . Smoking status: Former Smoker    Packs/day: 4.50    Years: 30.00    Quit date: 05/07/1987  . Smokeless tobacco: Former Neurosurgeon    Types: Chew  . Alcohol use No     ALLERGIES:   Allergies  Allergen Reactions  . Other Other (See Comments)    Pain medication that he was given after open heart surgery-sweating and hallucinations   . Aspirin Nausea And Vomiting    Can't take full strength uncoated aspirin  . Oxycodone Other (See Comments)    Hallucinations     CURRENT MEDICATIONS:   Current Outpatient Prescriptions  Medication Sig Dispense Refill  . acetaminophen (TYLENOL) 500 MG tablet Take 500 mg by mouth every 6 (six) hours as needed for mild pain.    Marland Kitchen albuterol (PROVENTIL HFA;VENTOLIN HFA) 108 (90 Base) MCG/ACT inhaler Inhale 1 puff into the lungs every 6 (six) hours as needed for wheezing or shortness of breath.    Marland Kitchen apixaban (ELIQUIS) 5 MG TABS tablet Take 1 tablet (5 mg total) by mouth 2 (two) times daily. 180 tablet 3  . aspirin EC  81 MG EC tablet Take 1 tablet (81 mg total) by mouth daily.    Marland Kitchen. atorvastatin (LIPITOR) 80 MG tablet TAKE 1 TABLET DAILY AT 6 PM. 30 tablet 5  . dofetilide (TIKOSYN) 500 MCG capsule Take 1 capsule (500 mcg total) by mouth 2 (two) times daily. 14 capsule 0  . furosemide (LASIX) 20 MG tablet Take 1 tablet (20 mg total) by mouth daily. 30 tablet 11  . isosorbide mononitrate (IMDUR) 30 MG 24 hr tablet Take 1 tablet (30 mg total) by mouth daily. 30 tablet 11  . lisinopril (PRINIVIL,ZESTRIL) 5 MG tablet Take 1 tablet (5 mg total) by mouth daily. Please keep your upcoming appointment (10/15/15) for refills. 30 tablet 1  . LORazepam (ATIVAN) 0.5 MG tablet Take 0.5 mg by mouth 2 (two) times  daily.     . Magnesium 400 MG CAPS Take 400 mg by mouth daily.    . metFORMIN (GLUCOPHAGE-XR) 750 MG 24 hr tablet Take 1 tablet by mouth daily.  1  . nitroGLYCERIN (NITROSTAT) 0.4 MG SL tablet Place 1 tablet (0.4 mg total) under the tongue every 5 (five) minutes as needed for chest pain. 25 tablet 3  . potassium chloride SA (K-DUR,KLOR-CON) 20 MEQ tablet Take 1.5 tablets (30 mEq total) by mouth daily. 45 tablet 6  . ranitidine (ZANTAC) 150 MG tablet Take 150 mg by mouth 2 (two) times daily.     No current facility-administered medications for this visit.     REVIEW OF SYSTEMS:   [X]  denotes positive finding, [ ]  denotes negative finding Cardiac  Comments:  Chest pain or chest pressure:    Shortness of breath upon exertion:    Short of breath when lying flat:    Irregular heart rhythm:        Vascular    Pain in calf, thigh, or hip brought on by ambulation:    Pain in feet at night that wakes you up from your sleep:     Blood clot in your veins:    Leg swelling:         Pulmonary    Oxygen at home:    Productive cough:     Wheezing:         Neurologic    Sudden weakness in arms or legs:     Sudden numbness in arms or legs:     Sudden onset of difficulty speaking or slurred speech:    Temporary loss of vision in one eye:     Problems with dizziness:         Gastrointestinal    Blood in stool:     Vomited blood:         Genitourinary    Burning when urinating:     Blood in urine:        Psychiatric    Major depression:         Hematologic    Bleeding problems:    Problems with blood clotting too easily:        Skin    Rashes or ulcers:        Constitutional    Fever or chills:      PHYSICAL EXAM:   Vitals:   07/28/16 1158 07/28/16 1205  BP: (!) 142/81 134/79  Pulse: 77   Resp: 20   Temp: 97.9 F (36.6 C)   TempSrc: Oral   SpO2: 95%   Weight: 288 lb (130.6 kg)   Height: 5' 11.5" (1.816 m)  GENERAL: The patient is a well-nourished male, in no  acute distress. The vital signs are documented above. CARDIAC: There is a regular rate and rhythm.  VASCULAR: Palpable pedal pulses.  No carotid bruits. PULMONARY: Non-labored respirations ABDOMEN: Soft and non-tender with normal pitched bowel sounds.  MUSCULOSKELETAL: There are no major deformities or cyanosis. NEUROLOGIC: No focal weakness or paresthesias are detected. SKIN: There are no ulcers or rashes noted. PSYCHIATRIC: The patient has a normal affect.  STUDIES:   I have reviewed the images of the CT scan which have not been officially read by radiology, however I did discuss the case and reviewed the images with Dr. Deanne Coffer.  There is a type II endoleak from the inferior mesenteric artery and lumbar vessels.  The aneurysm sac has increased in size approximately 1 mm with maximum diameter of 8.6 cm.  Has not been any significant change in the pulmonary Wise   MEDICAL ISSUES:   AAA:  I had an extensive discussion with the patient and his daughter regarding his aneurysm and the endoleak.  At this point, I would recommend continuing with observation as there is been very minimal change in the size of the aneurysm sac.  I did discuss however that if an the 6 month scan there has been a increased in size, I would recommend proceeding with intervention of the type II endoleak.  Pulmonary Wise: There does not appear to be any significant change.  I will Attention again to this area on repeat CT scan in 6 months    Durene Cal, MD [facility] Specialists of [facility] [phone] Pager [phone]

## 2016-07-29 DIAGNOSIS — I255 Ischemic cardiomyopathy: Secondary | ICD-10-CM | POA: Diagnosis not present

## 2016-07-29 LAB — BASIC METABOLIC PANEL
BUN: 17 mg/dL (ref 7–25)
CALCIUM: 9.1 mg/dL (ref 8.6–10.3)
CO2: 30 mmol/L (ref 20–31)
Chloride: 103 mmol/L (ref 98–110)
Creat: 1.12 mg/dL (ref 0.70–1.18)
GLUCOSE: 119 mg/dL — AB (ref 65–99)
Potassium: 4.6 mmol/L (ref 3.5–5.3)
SODIUM: 141 mmol/L (ref 135–146)

## 2016-07-29 LAB — MAGNESIUM: MAGNESIUM: 1.9 mg/dL (ref 1.5–2.5)

## 2016-07-31 ENCOUNTER — Encounter: Payer: Self-pay | Admitting: Internal Medicine

## 2016-07-31 ENCOUNTER — Ambulatory Visit (INDEPENDENT_AMBULATORY_CARE_PROVIDER_SITE_OTHER): Payer: Medicare Other | Admitting: Internal Medicine

## 2016-07-31 VITALS — BP 138/88 | HR 82 | Ht 71.0 in | Wt 296.2 lb

## 2016-07-31 DIAGNOSIS — I482 Chronic atrial fibrillation, unspecified: Secondary | ICD-10-CM

## 2016-07-31 DIAGNOSIS — Z79899 Other long term (current) drug therapy: Secondary | ICD-10-CM | POA: Diagnosis not present

## 2016-07-31 DIAGNOSIS — I255 Ischemic cardiomyopathy: Secondary | ICD-10-CM | POA: Diagnosis not present

## 2016-07-31 DIAGNOSIS — I4891 Unspecified atrial fibrillation: Secondary | ICD-10-CM | POA: Insufficient documentation

## 2016-07-31 DIAGNOSIS — Z5181 Encounter for therapeutic drug level monitoring: Secondary | ICD-10-CM

## 2016-07-31 MED ORDER — MAGNESIUM 400 MG PO CAPS: 400.0000 mg | ORAL_CAPSULE | Freq: Two times a day (BID) | ORAL | 5 refills | Status: AC

## 2016-07-31 NOTE — Progress Notes (Signed)
OFFICE NOTE  Chief Complaint:  Follow-up labs  Primary Care Physician: Thayer Headings, MD  HPI:  Ryan Wise is 76 y/o with a history of CAD, s/p CABG X 4 in 1999 with an LIMA-LAD, SVG-OM, SVG-Dx, SVG-RCA. He presented to [facility] 12/18/12 with a STEMI and was taken to the cath lab by Dr Tresa Endo. This revealed the LIMA to the LAD to be patent with collaterals to the Dx. The SVG- OM: was occluded and the SVG- DX: was occluded The culprit appeared to be occlusion of SVG to distal RCA with no flow proximal due to anastomis occlusion with thrombus. He underwent a very difficult PCI with PTCA/ thrombectomy. He had no flow phenomenon requiring IC/IV NTG/IC verapamil/ angiomax, brilinta 180 mg, integrelin. He ultimately received a Xience Xpedition stent to distal anastomosis. He did have acute, transient CHF but stabilized with diuresis. He did well and was transferred to telemetry on 12/21/12.. Troponin was greater than 20. EF at cath was 35%. EF by echo was 35=40% with severe hypokinesis of the basal inferolateral myocardium; moderate hypokinesis of the basal-mid inferior and mid inferolateral myocardium; and mild hypokinesis of the basal-mid inferoseptal and apical septal myocardium. After he was transferred to telemetry he awaked early in the morning of the 30th with neck pain. He was transferred back to the ICU by the cardiology fellow on call. The pt was seen and examined by Dr Rennis Golden the morning of the 29th. Dr Rennis Golden did not feel his symptoms were cardiac. We added a NSAID and Skelaxin. He took this for a day or 2 he reported his neck pain improved significantly.  Today is here for followup and appears to be doing fairly well. He does report some shortness of breath doing moderate to more intense activities which he is trying to avoid. He did some lawn mowing which made her short of breath after a while. 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 He does feel that his energy level  has improved significantly and hopefully this means that his EF has come up as well. He continues to take naproxen on a daily basis, however it was intended that he only take this for a short period time for his neck pain. He did however discontinue Skelaxin. I reviewed recent laboratory work from his primary care provider today including a cholesterol profile. I was surprised to see how low that was, noting that his total cholesterol was 80, triglycerides 107 HDL 35 and LDL of 24. This is on atorvastatin 80 mg daily.  I saw Ryan Wise back in the office today. He continues to do very well. He is asymptomatic this had no significant weight gain or worsening shortness of breath. He denies any extremity edema. Blood pressure is very well-controlled today 122/60. He recently had laboratory work through his primary care provider which shows excellent cholesterol control. He does have a history of cardiomyopathy and is due for repeat echocardiogram.   Ryan Wise returns today for follow-up. Overall he is doing exceedingly well. He denies any worsening chest pain or shortness of breath. He's done well on his current medicines. Cholesterol is been well controlled. 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 Recent echocardiogram earlier this year shows an improvement in EF up to 45%.  01/31/2016  Ryan Wise returns today for follow-up. Unfortunately recently presented to the emergency department with abdominal pain, nausea and vomiting as well as diarrhea and was thought to have a gastroenteritis.  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 Of note, he had an ultrasound of the abdomen in April 2016 which the radiologist commented that "the abdominal aorta is poorly seen due to overlying bowel gas". Subsequently he underwent EVAR which is complicated by a small type II endoleak. Since surgery Mr. Mckeithan is felt somewhat fatigued and does get short  of breath with some minimal exertion. In the office today he was noted to be in atrial fibrillation with a slow ventricular response at 47. This is a new diagnosis for him. He is on low-dose metoprolol, given his history of cardiomyopathy. He only takes aspirin for his current anticoagulation. 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  02/14/2016  Ryan Wise returns today for follow-up. He has done well with discontinuing his beta blocker. Heart rate is now improved up to 58. He remains in atrial fibrillation. He does not feel quite as fatigued but does notice some fatigue. He seems to be tolerating Eliquis without any bleeding problems. I did review his recent endoleak with Dr. Myra Gianotti who felt it was okay to start Eliquis in the setting of recent EVAR. At this point the next step would be to try to obtain a normal sinus rhythm. He will need to be anticoagulated for at least 3-4 weeks prior to cardioversion.  04/15/2016  Ryan Wise returns today for follow-up. He underwent outpatient cardioversion by myself which was unsuccessful. I therefore arranged for him to go on Tikosyn therapy. He was admitted and loaded on 500 g twice daily of Tikosyn and required cardioversion to get to sinus rhythm. This was successful and he has maintained sinus rhythm since then. He was discharged and followed up in the A. fib clinic. He was noted to have some hypokalemia and is due for repeat check of his potassium and magnesium. It was recommended that he have EKGs every 3 months and repeat of potassium and magnesium as per prescribing guidelines. He recently was seen in the emergency department for a "stabbing" mid upper back pain. This was felt to be reproducible and musculoskeletal. He did have a CT scan which ruled out dissection. He was given muscle relaxants and noted improvement with that and Tylenol.  07/31/2016  Ryan Wise returns today for follow-up. Overall he seems to be doing  well. EKG shows sinus rhythm today. QTC of 469 ms. He is on Tikosyn 500 g twice a day. He is potassium 2 days ago was 4.6 however magnesium was slightly low at 1.9. He is on magnesium oxide 400 mg daily. He denies any chest pain or worsening shortness of breath. There has been about a 6 pound weight gain however he reports has not been as active. He recently had bilateral cataract surgery for which she is recovering. He also saw Dr. Myra Gianotti recently, who noted that he had a small endoleak from his AAA repair but he will continue to monitor.  PMHx:  Past Medical History:  Diagnosis Date  . Aneurysm of infrarenal abdominal aorta (HCC)    a. 01/2016 s/p endovascular repair of AAA (EVAR).  . Arthritis   . Chronic combined systolic and diastolic CHF (congestive heart failure) (HCC)    a. 01/2016 Echo: EF 35-40%, diff HK, mildly dil Ao root, mild MR, mildly dil LA.  Marland Kitchen Coronary artery disease    a. 1999 s/p CABG x4 (LIMA->LAD, VG->OM, VG->Diag, VG->RCA); b. 11/2012 Inferior STEMI/PCI: LIMA->LAD ok, VG->Diag 100, VG->OM 100,  VG->RCA was culprit (PTCA/thrombectomy->Xience Xpedition DES), EF 25%.  Marland Kitchen. GERD (gastroesophageal reflux disease)   . High triglycerides   . Hypertension   . Ischemic Cardiomyopathy    a. 01/2016 Echo: EF 35-40%, diff HK, inflat, inf AK.  . Obesity   . Persistent atrial fibrillation (HCC)    a. Dx 01/2016-->slow vent response on beta blocker;  b. CHA2DS2VASc = 6-->Eliquis;  c. 03/03/2016 unsuccessful DCCV.  . Ruptured lumbar disc   . ST elevation myocardial infarction (STEMI) of inferior wall (HCC) 12/18/12   STEMI of inf. wall w/PCI with Xperdition stent to VG to distal RCA  . Type II diabetes mellitus (HCC)     Past Surgical History:  Procedure Laterality Date  . BACK SURGERY     (5) back surgeries  . CARDIOVERSION N/A 03/03/2016   Procedure: CARDIOVERSION;  Surgeon: Chrystie NoseKenneth C Bree Heinzelman, MD;  [facility];  Service: Cardiovascular;  Laterality: N/A;  . CARDIOVERSION N/A  03/12/2016   Procedure: CARDIOVERSION;  Surgeon: Chilton Siiffany Barnard, MD;  [facility];  Service: Cardiovascular;  Laterality: N/A;  . CATARACT EXTRACTION, BILATERAL Right 07/10/2016   left eye 07/24/16  . CORONARY ARTERY BYPASS GRAFT  1999   (CAD) CABG was a 5-vessel bypass and had a questionable history of A fib. He underwent monitoring whick showed sinus bradycardia and PACs but no evidence of A fib.  . ENDOVASCULAR STENT INSERTION N/A 12/28/2015   Procedure: ENDOVASCULAR STENT GRAFT INSERTION;  Surgeon: Nada LibmanVance W Brabham, MD;  [facility];  Service: Vascular;  Laterality: N/A;  . LEFT HEART CATHETERIZATION WITH CORONARY/GRAFT ANGIOGRAM  12/18/2012   Procedure: LEFT HEART CATHETERIZATION WITH Isabel CapriceORONARY/GRAFT ANGIOGRAM;  Surgeon: Lennette Biharihomas A Kelly, MD;  [facility];  Service: Cardiovascular;;  . LUMBAR DISC SURGERY    . open heart surgery  03/1998  . TONSILLECTOMY      FAMHx:  Family History  Problem Relation Age of Onset  . CAD Mother   . Heart disease Mother   . CAD Brother   . Heart attack Brother   . Hyperlipidemia Sister   . Leukemia Father   . CAD Maternal Grandmother   . Brain cancer Brother   . CAD Sister     SOCHx:   reports that he quit smoking about 29 years ago. He has a 135.00 pack-year smoking history. He has quit using smokeless tobacco. His smokeless tobacco use included Chew. He reports that he does not drink alcohol or use drugs.  ALLERGIES:  Allergies  Allergen Reactions  . Other Other (See Comments)    Pain medication that he was given after open heart surgery-sweating and hallucinations   . Aspirin Nausea And Vomiting    Can't take full strength uncoated aspirin  . Oxycodone Other (See Comments)    Hallucinations    ROS: Pertinent items noted in HPI and remainder of comprehensive ROS otherwise negative.  HOME MEDS: Current Outpatient Prescriptions  Medication Sig Dispense Refill  . acetaminophen (TYLENOL) 500 MG tablet Take 500 mg by  mouth every 6 (six) hours as needed for mild pain.    Marland Kitchen. albuterol (PROVENTIL HFA;VENTOLIN HFA) 108 (90 Base) MCG/ACT inhaler Inhale 1 puff into the lungs every 6 (six) hours as needed for wheezing or shortness of breath.    Marland Kitchen. apixaban (ELIQUIS) 5 MG TABS tablet Take 1 tablet (5 mg total) by mouth 2 (two) times daily. 180 tablet 3  . aspirin EC 81 MG EC tablet Take 1 tablet (81 mg total) by mouth daily.    .Marland Kitchen  atorvastatin (LIPITOR) 80 MG tablet TAKE 1 TABLET DAILY AT 6 PM. 30 tablet 5  . Bromfenac Sodium (PROLENSA) 0.07 % SOLN Apply 1 drop to eye as directed.    . dofetilide (TIKOSYN) 500 MCG capsule Take 1 capsule (500 mcg total) by mouth 2 (two) times daily. 14 capsule 0  . furosemide (LASIX) 20 MG tablet Take 1 tablet (20 mg total) by mouth daily. 30 tablet 11  . isosorbide mononitrate (IMDUR) 30 MG 24 hr tablet Take 1 tablet (30 mg total) by mouth daily. 30 tablet 11  . ketorolac (ACULAR) 0.4 % SOLN Place 1 drop into the left eye as directed.  1  . lisinopril (PRINIVIL,ZESTRIL) 5 MG tablet Take 1 tablet (5 mg total) by mouth daily. Please keep your upcoming appointment (10/15/15) for refills. 30 tablet 1  . LORazepam (ATIVAN) 0.5 MG tablet Take 0.5 mg by mouth 2 (two) times daily.     . Magnesium 400 MG CAPS Take 400 mg by mouth 2 (two) times daily. 60 capsule 5  . metFORMIN (GLUCOPHAGE-XR) 750 MG 24 hr tablet Take 1 tablet by mouth daily.  1  . metoprolol tartrate (LOPRESSOR) 25 MG tablet Take 12.5 mg by mouth 2 (two) times daily.  1  . nitroGLYCERIN (NITROSTAT) 0.4 MG SL tablet Place 1 tablet (0.4 mg total) under the tongue every 5 (five) minutes as needed for chest pain. 25 tablet 3  . potassium chloride SA (K-DUR,KLOR-CON) 20 MEQ tablet Take 1.5 tablets (30 mEq total) by mouth daily. 45 tablet 6  . ranitidine (ZANTAC) 150 MG tablet Take 150 mg by mouth 2 (two) times daily.     No current facility-administered medications for this visit.     LABS/IMAGING: No results found for this or any  previous visit (from the past 48 hour(s)). No results found.  VITALS: BP 138/88   Pulse 82   Ht 5\' 11"  (1.803 m)   Wt 296 lb 3.2 oz (134.4 kg)   BMI 41.31 kg/m   EXAM: General appearance: alert and no distress Lungs: clear to auscultation bilaterally Abdomen: soft, non-tender; bowel sounds normal; no masses,  no organomegaly Skin: Skin color, texture, turgor normal. No rashes or lesions Neurologic: Grossly normal Psych: Plesant  EKG: Sinus rhythm with first degree AV block at 82, QTC 469 ms  ASSESSMENT: 1. New onset atrial fibrillation with slow ventricular response-CHADSVASC score of 5 2. Chronic Tikosyn therapy 3. Recent AAA status post -EVAR with type II endoleak 4. Coronary artery disease status post CABG with ST elevation MI and stent placement to the vein graft to PDA, with occluded grafts to the OM and diagonal vessels and a patent LIMA to LAD 5. Ischemic cardiac myopathy EF 45% 6. Hypertension 7. Dyslipidemia 8. Hypomagnesemia  PLAN: 1.   Mr. Neil Crouch is maintaining sinus rhythm on Tikosyn. We've arranged for scheduled metabolic profiles and magnesium every 3 months. He'll increase his magnesium to 40 mg twice a day because of chronically slightly low magnesium. Plan follow-up with me in 6 months with a repeat echo before then to see if his cardiomyopathy has improved.  Chrystie Nose, MD, [facility] Attending Cardiologist [facility]  Chrystie Nose 07/31/2016, 5:09 PM

## 2016-07-31 NOTE — Patient Instructions (Addendum)
Your physician has requested that you have an echocardiogram in SIX MONTHS - prior to next appointment. Echocardiography is a painless test that uses sound waves to create images of your heart. It provides your doctor with information about the size and shape of your heart and how well your heart's chambers and valves are working. This procedure takes approximately one hour. There are no restrictions for this procedure.  Your physician has recommended you make the following change in your medication: INCREASE magnesium to 400mg  twice daily  Your physician recommends that you return for lab work in: THREE MONTHS (BMET, magnesium)   Your physician wants you to follow-up in: SIX MONTHS with Dr. Rennis GoldenHilty. You will receive a reminder letter in the mail two months in advance. If you don't receive a letter, please call our office to schedule the follow-up appointment.

## 2016-08-04 NOTE — Addendum Note (Signed)
Addended by: Burton ApleyPETTY, Marthe Dant A on: 08/04/2016 09:44 AM   Modules accepted: Orders

## 2016-09-23 ENCOUNTER — Telehealth: Payer: Self-pay | Admitting: Internal Medicine

## 2016-09-23 NOTE — Telephone Encounter (Signed)
3 m supply of tikosyn ordered through patient assistance. Order number is [id number]. Will be delivered to [facility] - will inform daughter.

## 2016-09-23 NOTE — Telephone Encounter (Signed)
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 I attempted to help the pts daughter with this over the phone but am unfamiliar with NL's protocol on patients medications assistance.  Will forward message to NL.

## 2016-09-23 NOTE — Telephone Encounter (Signed)
Spoke with the patient's daughter per the DPR. She stated that she needed a refill on Tikosyn for her father but she was having a hard time getting it. Will route for further assistance.

## 2016-09-23 NOTE — Telephone Encounter (Signed)
°  Patient daughter calling and states that patient needs his tikosyn medication refilled. Patient opened last bottle. Thanks.

## 2016-10-06 ENCOUNTER — Other Ambulatory Visit (HOSPITAL_COMMUNITY): Payer: Self-pay | Admitting: Nurse Practitioner

## 2016-11-24 DIAGNOSIS — I10 Essential (primary) hypertension: Secondary | ICD-10-CM | POA: Diagnosis not present

## 2016-11-24 DIAGNOSIS — E785 Hyperlipidemia, unspecified: Secondary | ICD-10-CM | POA: Diagnosis not present

## 2016-11-24 DIAGNOSIS — E559 Vitamin D deficiency, unspecified: Secondary | ICD-10-CM | POA: Diagnosis not present

## 2016-11-24 DIAGNOSIS — E119 Type 2 diabetes mellitus without complications: Secondary | ICD-10-CM | POA: Diagnosis not present

## 2016-12-01 DIAGNOSIS — J449 Chronic obstructive pulmonary disease, unspecified: Secondary | ICD-10-CM | POA: Diagnosis not present

## 2016-12-01 DIAGNOSIS — I5032 Chronic diastolic (congestive) heart failure: Secondary | ICD-10-CM | POA: Diagnosis not present

## 2016-12-01 DIAGNOSIS — E1122 Type 2 diabetes mellitus with diabetic chronic kidney disease: Secondary | ICD-10-CM | POA: Diagnosis not present

## 2016-12-01 DIAGNOSIS — N182 Chronic kidney disease, stage 2 (mild): Secondary | ICD-10-CM | POA: Diagnosis not present

## 2017-01-18 ENCOUNTER — Other Ambulatory Visit: Payer: Self-pay | Admitting: Internal Medicine

## 2017-01-19 NOTE — Telephone Encounter (Signed)
Rx(s) sent to pharmacy electronically.  

## 2017-01-27 ENCOUNTER — Ambulatory Visit: Payer: Medicare Other | Admitting: Internal Medicine

## 2017-02-04 ENCOUNTER — Ambulatory Visit (HOSPITAL_COMMUNITY): Payer: Medicare Other | Attending: Cardiovascular Disease

## 2017-02-04 ENCOUNTER — Other Ambulatory Visit: Payer: Self-pay

## 2017-02-04 DIAGNOSIS — I252 Old myocardial infarction: Secondary | ICD-10-CM | POA: Insufficient documentation

## 2017-02-04 DIAGNOSIS — Z6841 Body Mass Index (BMI) 40.0 and over, adult: Secondary | ICD-10-CM | POA: Diagnosis not present

## 2017-02-04 DIAGNOSIS — I2510 Atherosclerotic heart disease of native coronary artery without angina pectoris: Secondary | ICD-10-CM | POA: Insufficient documentation

## 2017-02-04 DIAGNOSIS — I119 Hypertensive heart disease without heart failure: Secondary | ICD-10-CM | POA: Diagnosis not present

## 2017-02-04 DIAGNOSIS — I482 Chronic atrial fibrillation, unspecified: Secondary | ICD-10-CM

## 2017-02-04 DIAGNOSIS — E785 Hyperlipidemia, unspecified: Secondary | ICD-10-CM | POA: Insufficient documentation

## 2017-02-04 DIAGNOSIS — Z951 Presence of aortocoronary bypass graft: Secondary | ICD-10-CM | POA: Diagnosis not present

## 2017-02-04 DIAGNOSIS — Z87891 Personal history of nicotine dependence: Secondary | ICD-10-CM | POA: Diagnosis not present

## 2017-02-04 DIAGNOSIS — I255 Ischemic cardiomyopathy: Secondary | ICD-10-CM | POA: Insufficient documentation

## 2017-02-04 DIAGNOSIS — I4891 Unspecified atrial fibrillation: Secondary | ICD-10-CM | POA: Diagnosis present

## 2017-02-04 LAB — ECHOCARDIOGRAM COMPLETE
Ao-asc: 39 cm
CHL CUP STROKE VOLUME: 78 mL
E decel time: 278 msec
EERAT: 9.89
FS: 18 % — AB (ref 28–44)
IVS/LV PW RATIO, ED: 1.05
LA ID, A-P, ES: 42 mm
LA diam index: 1.69 cm/m2
LA vol A4C: 102 ml
LA vol index: 39.4 mL/m2
LA vol: 98 mL
LDCA: 5.31 cm2
LEFT ATRIUM END SYS DIAM: 42 mm
LV E/e' medial: 9.89
LV E/e'average: 9.89
LV SIMPSON'S DISK: 41
LV TDI E'LATERAL: 8.09
LV TDI E'MEDIAL: 5.65
LV dias vol index: 76 mL/m2
LV dias vol: 188 mL — AB (ref 62–150)
LV e' LATERAL: 8.09 cm/s
LV sys vol index: 44 mL/m2
LV sys vol: 110 mL — AB (ref 21–61)
LVOT VTI: 16.3 cm
LVOT peak grad rest: 3 mmHg
LVOT peak vel: 84.2 cm/s
LVOTD: 26 mm
LVOTSV: 87 mL
MV Dec: 278
MVPG: 3 mmHg
MVPKAVEL: 98.2 m/s
MVPKEVEL: 80 m/s
PW: 14.2 mm — AB (ref 0.6–1.1)
RV LATERAL S' VELOCITY: 10.7 cm/s

## 2017-02-16 ENCOUNTER — Ambulatory Visit (INDEPENDENT_AMBULATORY_CARE_PROVIDER_SITE_OTHER): Payer: Medicare Other | Admitting: Internal Medicine

## 2017-02-16 ENCOUNTER — Encounter: Payer: Self-pay | Admitting: Internal Medicine

## 2017-02-16 VITALS — BP 125/83 | HR 69 | Ht 74.0 in | Wt 297.6 lb

## 2017-02-16 DIAGNOSIS — Z79899 Other long term (current) drug therapy: Secondary | ICD-10-CM

## 2017-02-16 DIAGNOSIS — I481 Persistent atrial fibrillation: Secondary | ICD-10-CM

## 2017-02-16 DIAGNOSIS — I4819 Other persistent atrial fibrillation: Secondary | ICD-10-CM

## 2017-02-16 DIAGNOSIS — Z5181 Encounter for therapeutic drug level monitoring: Secondary | ICD-10-CM

## 2017-02-16 DIAGNOSIS — I255 Ischemic cardiomyopathy: Secondary | ICD-10-CM

## 2017-02-16 NOTE — Progress Notes (Signed)
OFFICE NOTE  Chief Complaint:  No complaints  Primary Care Physician: Thayer Headings, MD  HPI:  Ryan Wise is 76 y/o with a history of CAD, s/p CABG X 4 in 1999 with an LIMA-LAD, SVG-OM, SVG-Dx, SVG-RCA. He presented to [facility] 12/18/12 with a STEMI and was taken to the cath lab by Dr Tresa Endo. This revealed the LIMA to the LAD to be patent with collaterals to the Dx. The SVG- OM: was occluded and the SVG- DX: was occluded The culprit appeared to be occlusion of SVG to distal RCA with no flow proximal due to anastomis occlusion with thrombus. He underwent a very difficult PCI with PTCA/ thrombectomy. He had no flow phenomenon requiring IC/IV NTG/IC verapamil/ angiomax, brilinta 180 mg, integrelin. He ultimately received a Xience Xpedition stent to distal anastomosis. He did have acute, transient CHF but stabilized with diuresis. He did well and was transferred to telemetry on 12/21/12.. Troponin was greater than 20. EF at cath was 35%. EF by echo was 35=40% with severe hypokinesis of the basal inferolateral myocardium; moderate hypokinesis of the basal-mid inferior and mid inferolateral myocardium; and mild hypokinesis of the basal-mid inferoseptal and apical septal myocardium. After he was transferred to telemetry he awaked early in the morning of the 30th with neck pain. He was transferred back to the ICU by the cardiology fellow on call. The pt was seen and examined by Dr Rennis Golden the morning of the 29th. Dr Rennis Golden did not feel his symptoms were cardiac. We added a NSAID and Skelaxin. He took this for a day or 2 he reported his neck pain improved significantly.  Today is here for followup and appears to be doing fairly well. He does report some shortness of breath doing moderate to more intense activities which he is trying to avoid. He did some lawn mowing which made her short of breath after a while. 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 He does feel that his energy level  has improved significantly and hopefully this means that his EF has come up as well. He continues to take naproxen on a daily basis, however it was intended that he only take this for a short period time for his neck pain. He did however discontinue Skelaxin. I reviewed recent laboratory work from his primary care provider today including a cholesterol profile. I was surprised to see how low that was, noting that his total cholesterol was 80, triglycerides 107 HDL 35 and LDL of 24. This is on atorvastatin 80 mg daily.  I saw Ryan Wise back in the office today. He continues to do very well. He is asymptomatic this had no significant weight gain or worsening shortness of breath. He denies any extremity edema. Blood pressure is very well-controlled today 122/60. He recently had laboratory work through his primary care provider which shows excellent cholesterol control. He does have a history of cardiomyopathy and is due for repeat echocardiogram.   Ryan Wise returns today for follow-up. Overall he is doing exceedingly well. He denies any worsening chest pain or shortness of breath. He's done well on his current medicines. Cholesterol is been well controlled. 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 Recent echocardiogram earlier this year shows an improvement in EF up to 45%.  01/31/2016  Ryan Wise returns today for follow-up. Unfortunately recently presented to the emergency department with abdominal pain, nausea and vomiting as well as diarrhea and was thought to have a  gastroenteritis. 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 Of note, he had an ultrasound of the abdomen in April 2016 which the radiologist commented that "the abdominal aorta is poorly seen due to overlying bowel gas". Subsequently he underwent EVAR which is complicated by a small type II endoleak. Since surgery Ryan Wise is felt somewhat fatigued and does get short  of breath with some minimal exertion. In the office today he was noted to be in atrial fibrillation with a slow ventricular response at 47. This is a new diagnosis for him. He is on low-dose metoprolol, given his history of cardiomyopathy. He only takes aspirin for his current anticoagulation. 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  02/14/2016  Ryan Wise returns today for follow-up. He has done well with discontinuing his beta blocker. Heart rate is now improved up to 58. He remains in atrial fibrillation. He does not feel quite as fatigued but does notice some fatigue. He seems to be tolerating Eliquis without any bleeding problems. I did review his recent endoleak with Dr. Myra Gianotti who felt it was okay to start Eliquis in the setting of recent EVAR. At this point the next step would be to try to obtain a normal sinus rhythm. He will need to be anticoagulated for at least 3-4 weeks prior to cardioversion.  04/15/2016  Ryan Wise returns today for follow-up. He underwent outpatient cardioversion by myself which was unsuccessful. I therefore arranged for him to go on Tikosyn therapy. He was admitted and loaded on 500 g twice daily of Tikosyn and required cardioversion to get to sinus rhythm. This was successful and he has maintained sinus rhythm since then. He was discharged and followed up in the A. fib clinic. He was noted to have some hypokalemia and is due for repeat check of his potassium and magnesium. It was recommended that he have EKGs every 3 months and repeat of potassium and magnesium as per prescribing guidelines. He recently was seen in the emergency department for a "stabbing" mid upper back pain. This was felt to be reproducible and musculoskeletal. He did have a CT scan which ruled out dissection. He was given muscle relaxants and noted improvement with that and Tylenol.  07/31/2016  Ryan Wise returns today for follow-up. Overall he seems to be doing  well. EKG shows sinus rhythm today. QTC of 469 ms. He is on Tikosyn 500 g twice a day. He is potassium 2 days ago was 4.6 however magnesium was slightly low at 1.9. He is on magnesium oxide 400 mg daily. He denies any chest pain or worsening shortness of breath. There has been about a 6 pound weight gain however he reports has not been as active. He recently had bilateral cataract surgery for which she is recovering. He also saw Dr. Myra Gianotti recently, who noted that he had a small endoleak from his AAA repair but he will continue to monitor.  02/16/2017  Ryan Wise returns today for follow-up. He seems to be doing really well. His echo recent showed improvement in LV function with an EF increased to 45-50%. He denies any recurrent atrial fibrillation. He appears to be in either a low atrial accelerated junctional rhythm today is 69. QTC is 467 ms. He is on dofetilide 500 g twice a day. He also takes Eliquis and denies any bleeding problems. He's scheduled for follow-up with Dr. Myra Gianotti for an endoleak of his AAA repair. 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  PMHx:  Past Medical History:  Diagnosis Date  . Aneurysm of infrarenal abdominal aorta (HCC)    a. 01/2016 s/p endovascular repair of AAA (EVAR).  . Arthritis   . Chronic combined systolic and diastolic CHF (congestive heart failure) (HCC)    a. 01/2016 Echo: EF 35-40%, diff HK, mildly dil Ao root, mild MR, mildly dil LA.  Marland Kitchen Coronary artery disease    a. 1999 s/p CABG x4 (LIMA->LAD, VG->OM, VG->Diag, VG->RCA); b. 11/2012 Inferior STEMI/PCI: LIMA->LAD ok, VG->Diag 100, VG->OM 100, VG->RCA was culprit (PTCA/thrombectomy->Xience Xpedition DES), EF 25%.  Marland Kitchen GERD (gastroesophageal reflux disease)   . High triglycerides   . Hypertension   . Ischemic Cardiomyopathy    a. 01/2016 Echo: EF 35-40%, diff HK, inflat, inf AK.  . Obesity   . Persistent atrial  fibrillation (HCC)    a. Dx 01/2016-->slow vent response on beta blocker;  b. CHA2DS2VASc = 6-->Eliquis;  c. 03/03/2016 unsuccessful DCCV.  . Ruptured lumbar disc   . ST elevation myocardial infarction (STEMI) of inferior wall (HCC) 12/18/12   STEMI of inf. wall w/PCI with Xperdition stent to VG to distal RCA  . Type II diabetes mellitus (HCC)     Past Surgical History:  Procedure Laterality Date  . BACK SURGERY     (5) back surgeries  . CARDIOVERSION N/A 03/03/2016   Procedure: CARDIOVERSION;  Surgeon: Chrystie Nose, MD;  Location: [facility] ENDOSCOPY;  Service: Cardiovascular;  Laterality: N/A;  . CARDIOVERSION N/A 03/12/2016   Procedure: CARDIOVERSION;  Surgeon: Chilton Si, MD;  Location: [facility] ENDOSCOPY;  Service: Cardiovascular;  Laterality: N/A;  . CATARACT EXTRACTION, BILATERAL Right 07/10/2016   left eye 07/24/16  . CORONARY ARTERY BYPASS GRAFT  1999   (CAD) CABG was a 5-vessel bypass and had a questionable history of A fib. He underwent monitoring whick showed sinus bradycardia and PACs but no evidence of A fib.  . ENDOVASCULAR STENT INSERTION N/A 12/28/2015   Procedure: ENDOVASCULAR STENT GRAFT INSERTION;  Surgeon: Nada Libman, MD;  Location: [facility] OR;  Service: Vascular;  Laterality: N/A;  . LEFT HEART CATHETERIZATION WITH CORONARY/GRAFT ANGIOGRAM  12/18/2012   Procedure: LEFT HEART CATHETERIZATION WITH Isabel Caprice;  Surgeon: [name], MD;  Location: [facility] CATH LAB;  Service: Cardiovascular;;  . LUMBAR DISC SURGERY    . open heart surgery  03/1998  . TONSILLECTOMY      FAMHx:  Family History  Problem Relation Age of Onset  . CAD Mother   . Heart disease Mother   . CAD Brother   . Heart attack Brother   . Hyperlipidemia Sister   . Leukemia Father   . CAD Maternal Grandmother   . Brain cancer Brother   . CAD Sister     SOCHx:   reports that he quit smoking about 29 years ago. He has a 135.00 pack-year smoking history. He has quit using smokeless tobacco. His  smokeless tobacco use included Chew. He reports that he does not drink alcohol or use drugs.  ALLERGIES:  Allergies  Allergen Reactions  . Other Other (See Comments)    Pain medication that he was given after open heart surgery-sweating and hallucinations   . Aspirin Nausea And Vomiting    Can't take full strength uncoated aspirin  . Oxycodone Other (See Comments)    Hallucinations    ROS: Pertinent items noted in  HPI and remainder of comprehensive ROS otherwise negative.  HOME MEDS: Current Outpatient Prescriptions  Medication Sig Dispense Refill  . acetaminophen (TYLENOL) 500 MG tablet Take 500 mg by mouth every 6 (six) hours as needed for mild pain.    Marland Kitchen albuterol (PROVENTIL HFA;VENTOLIN HFA) 108 (90 Base) MCG/ACT inhaler Inhale 1 puff into the lungs every 6 (six) hours as needed for wheezing or shortness of breath.    Marland Kitchen apixaban (ELIQUIS) 5 MG TABS tablet Take 1 tablet (5 mg total) by mouth 2 (two) times daily. 180 tablet 3  . aspirin EC 81 MG EC tablet Take 1 tablet (81 mg total) by mouth daily.    Marland Kitchen atorvastatin (LIPITOR) 80 MG tablet TAKE 1 TABLET DAILY AT 6 PM. 30 tablet 5  . Bromfenac Sodium (PROLENSA) 0.07 % SOLN Apply 1 drop to eye as directed.    . dofetilide (TIKOSYN) 500 MCG capsule Take 1 capsule (500 mcg total) by mouth 2 (two) times daily. 14 capsule 0  . furosemide (LASIX) 20 MG tablet Take 1 tablet (20 mg total) by mouth daily. 30 tablet 11  . isosorbide mononitrate (IMDUR) 30 MG 24 hr tablet Take 1 tablet (30 mg total) by mouth daily. 30 tablet 1  . ketorolac (ACULAR) 0.4 % SOLN Place 1 drop into the left eye as directed.  1  . KLOR-CON M20 20 MEQ tablet TAKE 1.5 TABLETS (30 MEQ TOTAL) BY MOUTH DAILY. 45 tablet 6  . lisinopril (PRINIVIL,ZESTRIL) 5 MG tablet Take 1 tablet (5 mg total) by mouth daily. Please keep your upcoming appointment (10/15/15) for refills. 30 tablet 1  . LORazepam (ATIVAN) 0.5 MG tablet Take 0.5 mg by mouth 2 (two) times daily.     . Magnesium  400 MG CAPS Take 400 mg by mouth 2 (two) times daily. 60 capsule 5  . metFORMIN (GLUCOPHAGE-XR) 750 MG 24 hr tablet Take 1 tablet by mouth daily.  1  . metoprolol tartrate (LOPRESSOR) 25 MG tablet Take 12.5 mg by mouth 2 (two) times daily.  1  . nitroGLYCERIN (NITROSTAT) 0.4 MG SL tablet Place 1 tablet (0.4 mg total) under the tongue every 5 (five) minutes as needed for chest pain. 25 tablet 3  . ranitidine (ZANTAC) 150 MG tablet Take 150 mg by mouth 2 (two) times daily.     No current facility-administered medications for this visit.     LABS/IMAGING: No results found for this or any previous visit (from the past 48 hour(s)). No results found.  VITALS: BP 125/83   Pulse 69   Ht  (1.88 m)   Wt 297 lb 9.6 oz (135 kg)   BMI 38.21 kg/m   EXAM: General appearance: alert and no distress Neck: no carotid bruit, no JVD and thyroid not enlarged, symmetric, no tenderness/mass/nodules Lungs: clear to auscultation bilaterally Heart: regular rate and rhythm, S1, S2 normal, no murmur, click, rub or gallop Abdomen: soft, non-tender; bowel sounds normal; no masses,  no organomegaly Extremities: extremities normal, atraumatic, no cyanosis or edema Pulses: 2+ and symmetric Skin: Skin color, texture, turgor normal. No rashes or lesions Neurologic: Grossly normal Psych: Pleasant  EKG: Accelerated junctional rhythm at 69, inferior infarct-personally reviewed, QTC 467 ms  ASSESSMENT: 1. New onset atrial fibrillation with slow ventricular response-CHADSVASC score of 5 2. Chronic Tikosyn therapy 3. Recent AAA status post -EVAR with type II endoleak 4. Coronary artery disease status post CABG with ST elevation MI and stent placement to the vein graft to PDA, with occluded grafts  to the OM and diagonal vessels and a patent LIMA to LAD 5. Ischemic cardiac myopathy EF 45% (improved to 45-50% by echo in 01/2017) 6. Hypertension 7. Dyslipidemia  PLAN: 1.   Ryan Wise seems to be maintaining  either sinus rhythm or a low atrial rhythm. He is on Tikosyn with a stable QTC. He denies any bleeding problems on Eliquis. He has a AAA which was repaired and has a type II endoleak. He is scheduled to follow-up with Dr. Myra Gianotti for this. He denies any worsening chest pain. His echo showed an improvement in LVEF up to 45-50% this month. We'll continue current therapies. Follow-up with me in 6 months.  Chrystie Nose, MD, [facility] Attending Cardiologist [facility]  Chrystie Nose 02/16/2017, 2:47 PM

## 2017-02-16 NOTE — Patient Instructions (Signed)
Your physician wants you to follow-up in: 6 months with Dr. Hilty. You will receive a reminder letter in the mail two months in advance. If you don't receive a letter, please call our office to schedule the follow-up appointment.    

## 2017-02-17 ENCOUNTER — Encounter: Payer: Self-pay | Admitting: Internal Medicine

## 2017-02-17 ENCOUNTER — Telehealth: Payer: Self-pay | Admitting: Surgery

## 2017-02-17 NOTE — Telephone Encounter (Signed)
Sched appts 03/09/17; CTA at 3:00 and [place] [facility] 301 and MD at 4:00. Spoke to daughter.

## 2017-02-17 NOTE — Telephone Encounter (Signed)
-----   Message from Shari Prows sent at 02/17/2017 10:48 AM EDT ----- Regarding: 6 mo fu w/ CT Contact: [phone] [name], This patient's daughter Lazarus Salines called yesterday regarding her father's 6 mo fu appt w/ CT scan. He was last seen on 07/28/16 by Dr.Brabham and you checked the patient out on that date. Are you working on this appointment? And if so, can you notify the daughter of the appt date/times,etc. Thanks, Drinda Butts

## 2017-02-20 ENCOUNTER — Other Ambulatory Visit: Payer: Self-pay

## 2017-02-20 DIAGNOSIS — I714 Abdominal aortic aneurysm, without rupture, unspecified: Secondary | ICD-10-CM

## 2017-02-20 DIAGNOSIS — Z48812 Encounter for surgical aftercare following surgery on the circulatory system: Secondary | ICD-10-CM

## 2017-02-22 ENCOUNTER — Other Ambulatory Visit: Payer: Self-pay | Admitting: Internal Medicine

## 2017-03-09 ENCOUNTER — Other Ambulatory Visit: Payer: Medicare Other

## 2017-03-09 ENCOUNTER — Ambulatory Visit
Admission: RE | Admit: 2017-03-09 | Discharge: 2017-03-09 | Disposition: A | Discharge status: Home / Self Care | Payer: Medicare Other | Source: Ambulatory Visit | Attending: Surgery | Admitting: Surgery

## 2017-03-09 ENCOUNTER — Ambulatory Visit (INDEPENDENT_AMBULATORY_CARE_PROVIDER_SITE_OTHER): Payer: Medicare Other | Admitting: Surgery

## 2017-03-09 ENCOUNTER — Encounter: Payer: Self-pay | Admitting: Surgery

## 2017-03-09 VITALS — BP 149/91 | HR 71 | Temp 98.3°F | Resp 20 | Ht 74.0 in | Wt 294.0 lb

## 2017-03-09 DIAGNOSIS — I714 Abdominal aortic aneurysm, without rupture, unspecified: Secondary | ICD-10-CM

## 2017-03-09 DIAGNOSIS — Z48812 Encounter for surgical aftercare following surgery on the circulatory system: Secondary | ICD-10-CM

## 2017-03-09 DIAGNOSIS — I713 Abdominal aortic aneurysm, ruptured, unspecified: Secondary | ICD-10-CM

## 2017-03-09 DIAGNOSIS — I700 Atherosclerosis of aorta: Secondary | ICD-10-CM | POA: Diagnosis not present

## 2017-03-09 MED ORDER — IOPAMIDOL (ISOVUE-370) INJECTION 76%
75.0000 mL | Freq: Once | INTRAVENOUS | Status: AC | PRN
  Administered 2017-03-09: 75 mL via INTRAVENOUS

## 2017-03-09 NOTE — Progress Notes (Signed)
Vascular and Vein Specialist of [facility]  Patient name: Ryan Wise MRN: [id number] DOB: [birth year] Sex: male   REASON FOR VISIT:    Follow up  HISOTRY OF PRESENT ILLNESS:   Hutch Rhett a 76 y.o.malereturns today for follow-up. 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 His postoperative course was uncomplicated.   He has no complaints today.   PAST MEDICAL HISTORY:   Past Medical History:  Diagnosis Date  . Aneurysm of infrarenal abdominal aorta (HCC)    a. 01/2016 s/p endovascular repair of AAA (EVAR).  . Arthritis   . Chronic combined systolic and diastolic CHF (congestive heart failure) (HCC)    a. 01/2016 Echo: EF 35-40%, diff HK, mildly dil Ao root, mild MR, mildly dil LA.  Marland Kitchen Coronary artery disease    a. 1999 s/p CABG x4 (LIMA->LAD, VG->OM, VG->Diag, VG->RCA); b. 11/2012 Inferior STEMI/PCI: LIMA->LAD ok, VG->Diag 100, VG->OM 100, VG->RCA was culprit (PTCA/thrombectomy->Xience Xpedition DES), EF 25%.  Marland Kitchen GERD (gastroesophageal reflux disease)   . High triglycerides   . Hypertension   . Ischemic Cardiomyopathy    a. 01/2016 Echo: EF 35-40%, diff HK, inflat, inf AK.  . Obesity   . Persistent atrial fibrillation (HCC)    a. Dx 01/2016-->slow vent response on beta blocker;  b. CHA2DS2VASc = 6-->Eliquis;  c. 03/03/2016 unsuccessful DCCV.  . Ruptured lumbar disc   . ST elevation myocardial infarction (STEMI) of inferior wall (HCC) 12/18/12   STEMI of inf. wall w/PCI with Xperdition stent to VG to distal RCA  . Type II diabetes mellitus (HCC)      FAMILY HISTORY:   Family History  Problem Relation Age of Onset  . CAD Mother   . Heart disease Mother   . CAD Brother   . Heart attack Brother   . Hyperlipidemia Sister   . Leukemia Father   . CAD Maternal Grandmother    . Brain cancer Brother   . CAD Sister     SOCIAL HISTORY:   Social History  Substance Use Topics  . Smoking status: Former Smoker    Packs/day: 4.50    Years: 30.00    Quit date: 05/07/1987  . Smokeless tobacco: Former Neurosurgeon    Types: Chew  . Alcohol use No     ALLERGIES:   Allergies  Allergen Reactions  . Other Other (See Comments)    Pain medication that he was given after open heart surgery-sweating and hallucinations   . Aspirin Nausea And Vomiting    Can't take full strength uncoated aspirin  . Oxycodone Other (See Comments)    Hallucinations     CURRENT MEDICATIONS:   Current Outpatient Prescriptions  Medication Sig Dispense Refill  . acetaminophen (TYLENOL) 500 MG tablet Take 500 mg by mouth every 6 (six) hours as needed for mild pain.    Marland Kitchen albuterol (PROVENTIL HFA;VENTOLIN HFA) 108 (90 Base) MCG/ACT inhaler Inhale 1 puff into the lungs every 6 (six) hours as needed for wheezing or shortness of breath.    Marland Kitchen apixaban (ELIQUIS) 5 MG TABS tablet Take 1 tablet (5 mg total) by mouth 2 (two) times daily. 180 tablet 3  . aspirin EC 81 MG EC tablet Take 1 tablet (81 mg total) by mouth daily.    Marland Kitchen atorvastatin (LIPITOR) 80 MG tablet TAKE 1 TABLET DAILY AT 6 PM. 30 tablet 5  .  Bromfenac Sodium (PROLENSA) 0.07 % SOLN Apply 1 drop to eye as directed.    . dofetilide (TIKOSYN) 500 MCG capsule Take 1 capsule (500 mcg total) by mouth 2 (two) times daily. 14 capsule 0  . furosemide (LASIX) 20 MG tablet TAKE 1 TABLET EVERY DAY 90 tablet 3  . isosorbide mononitrate (IMDUR) 30 MG 24 hr tablet Take 1 tablet (30 mg total) by mouth daily. 30 tablet 1  . ketorolac (ACULAR) 0.4 % SOLN Place 1 drop into the left eye as directed.  1  . KLOR-CON M20 20 MEQ tablet TAKE 1.5 TABLETS (30 MEQ TOTAL) BY MOUTH DAILY. 45 tablet 6  . lisinopril (PRINIVIL,ZESTRIL) 5 MG tablet Take 1 tablet (5 mg total) by mouth daily. Please keep your upcoming appointment (10/15/15) for refills. 30 tablet 1  .  LORazepam (ATIVAN) 0.5 MG tablet Take 0.5 mg by mouth 2 (two) times daily.     . Magnesium 400 MG CAPS Take 400 mg by mouth 2 (two) times daily. 60 capsule 5  . metFORMIN (GLUCOPHAGE-XR) 750 MG 24 hr tablet Take 1 tablet by mouth daily.  1  . metoprolol tartrate (LOPRESSOR) 25 MG tablet Take 12.5 mg by mouth 2 (two) times daily.  1  . nitroGLYCERIN (NITROSTAT) 0.4 MG SL tablet Place 1 tablet (0.4 mg total) under the tongue every 5 (five) minutes as needed for chest pain. 25 tablet 3  . ranitidine (ZANTAC) 150 MG tablet Take 150 mg by mouth 2 (two) times daily.     No current facility-administered medications for this visit.     REVIEW OF SYSTEMS:    denotes positive finding,  denotes negative finding Cardiac  Comments:  Chest pain or chest pressure:    Shortness of breath upon exertion:    Short of breath when lying flat:    Irregular heart rhythm:        Vascular    Pain in calf, thigh, or hip brought on by ambulation:    Pain in feet at night that wakes you up from your sleep:     Blood clot in your veins:    Leg swelling:         Pulmonary    Oxygen at home:    Productive cough:     Wheezing:         Neurologic    Sudden weakness in arms or legs:     Sudden numbness in arms or legs:     Sudden onset of difficulty speaking or slurred speech:    Temporary loss of vision in one eye:     Problems with dizziness:         Gastrointestinal    Blood in stool:     Vomited blood:         Genitourinary    Burning when urinating:     Blood in urine:        Psychiatric    Major depression:         Hematologic    Bleeding problems:    Problems with blood clotting too easily:        Skin    Rashes or ulcers:        Constitutional    Fever or chills:      PHYSICAL EXAM:   There were no vitals filed for this visit.  GENERAL: The patient is a well-nourished male, in no acute distress. The vital signs are documented above. CARDIAC: There is a regular rate and  rhythm.  VASCULAR:  No carotid bruits PULMONARY: Non-labored respirations ABDOMEN: Soft and non-tender  MUSCULOSKELETAL: There are no major deformities or cyanosis. NEUROLOGIC: No focal weakness or paresthesias are detected. SKIN: There are no ulcers or rashes noted. PSYCHIATRIC: The patient has a normal affect.  STUDIES:   I have reviewed the CT scan.  Type II endoleak is more readily apparent.  There has been a slight increase in the size of the aneurysm  MEDICAL ISSUES:   I have recommended referral to interventional radiology for consideration of intervention for the type II endoleak.  I will also schedule the patient to follow-up with me in 6 months with a repeat CT angiogram of the abdomen and pelvis.  I'm also getting carotid Doppler studies at that time, as he did not have them prior to his operation.    Wells BrabDurene Calscular and [facility] [phone] Pager [phone]

## 2017-03-10 NOTE — Addendum Note (Signed)
Addended by: Burton Apley A on: 03/10/2017 09:29 AM   Modules accepted: Orders

## 2017-03-23 ENCOUNTER — Other Ambulatory Visit: Payer: Self-pay | Admitting: Surgery

## 2017-03-23 DIAGNOSIS — I713 Abdominal aortic aneurysm, ruptured, unspecified: Secondary | ICD-10-CM

## 2017-03-24 ENCOUNTER — Ambulatory Visit
Admission: RE | Admit: 2017-03-24 | Discharge: 2017-03-24 | Disposition: A | Discharge status: Home / Self Care | Payer: Medicare Other | Source: Ambulatory Visit | Attending: Surgery | Admitting: Surgery

## 2017-03-24 ENCOUNTER — Other Ambulatory Visit: Payer: Self-pay | Admitting: Internal Medicine

## 2017-03-24 DIAGNOSIS — I713 Abdominal aortic aneurysm, ruptured, unspecified: Secondary | ICD-10-CM

## 2017-03-24 DIAGNOSIS — T82898A Other specified complication of vascular prosthetic devices, implants and grafts, initial encounter: Secondary | ICD-10-CM | POA: Diagnosis not present

## 2017-03-24 HISTORY — PX: IR RADIOLOGIST EVAL & MGMT: IMG5224

## 2017-03-24 NOTE — Consult Note (Signed)
Chief Complaint: Patient was seen in consultation today for type II endoleak repair at the request of Brabham,Vance W  Referring Physician(s): Harold Barban W  History of Present Illness: Ryan Wise is a 76 y.o. male who is status post endovascular repair of an 8.7 cm infrarenal abdominal aortic aneurysm on 12/28/2015 with placement of a Gore Excluder device. He did well after the procedure and has been asymptomatic since aneurysm repair. Follow-up CT angiography has been performed on 01/17/2016, 07/28/2016 and 03/10/2017. 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 Maximal current AP diameter of the aneurysm sac is approximately 8.7 cm. Transverse width of the aneurysm sac is approximately 8 cm. AP diameter had decreased slightly to approximately 8.5 cm on the initial CTA. Review of the CTA studies also shows that the region of endoleak may be slightly larger by CTA on the last 2 studies compared to the study in 02/09/2023 immediately after [facility].  Past Medical History:  Diagnosis Date  . Aneurysm of infrarenal abdominal aorta ([place])    a. 01/2016 s/p endovascular repair of AAA ([facility]).  . Arthritis   . Chronic combined systolic and diastolic CHF (congestive heart failure) ([place])    a. 01/2016 Echo: EF 35-40%, diff HK, mildly dil Ao root, mild MR, mildly dil LA.  Marland Kitchen Coronary artery disease    a. 1999 s/p CABG x4 (LIMA->LAD, VG->OM, VG->Diag, VG->RCA); b. 11/2012 Inferior STEMI/PCI: LIMA->LAD ok, VG->Diag 100, VG->OM 100, VG->RCA was culprit (PTCA/thrombectomy->Xience Xpedition DES), EF 25%.  Marland Kitchen GERD (gastroesophageal reflux disease)   . High triglycerides   . Hypertension   . Ischemic Cardiomyopathy    a. 01/2016 Echo: EF 35-40%, diff HK, inflat, inf AK.  . Obesity   . Persistent atrial fibrillation ([place])    a. Dx 01/2016-->slow vent response on beta blocker;  b.  CHA2DS2VASc = 6-->Eliquis;  c. 03/03/2016 unsuccessful DCCV.  . Ruptured lumbar disc   . ST elevation myocardial infarction (STEMI) of inferior wall ([place]) 12/18/12   STEMI of inf. wall w/PCI with Xperdition stent to VG to distal RCA  . Type II diabetes mellitus ([place])     Past Surgical History:  Procedure Laterality Date  . BACK SURGERY     (5) back surgeries  . CARDIOVERSION N/A 03/03/2016   Procedure: CARDIOVERSION;  Surgeon: Pixie Casino, MD;  Location: [facility] ENDOSCOPY;  Service: Cardiovascular;  Laterality: N/A;  . CARDIOVERSION N/A 03/12/2016   Procedure: CARDIOVERSION;  Surgeon: Skeet Latch, MD;  Location: [place];  Service: Cardiovascular;  Laterality: N/A;  . CATARACT EXTRACTION, BILATERAL Right 07/10/2016   left eye 07/24/16  . CORONARY ARTERY BYPASS GRAFT  1999   (CAD) CABG was a 5-vessel bypass and had a questionable history of A fib. He underwent monitoring whick showed sinus bradycardia and PACs but no evidence of A fib.  . ENDOVASCULAR STENT INSERTION N/A 12/28/2015   Procedure: ENDOVASCULAR STENT GRAFT INSERTION;  Surgeon: Serafina Mitchell, MD;  Location: [place];  Service: Vascular;  Laterality: N/A;  . LEFT HEART CATHETERIZATION WITH CORONARY/GRAFT ANGIOGRAM  12/18/2012   Procedure: LEFT HEART CATHETERIZATION WITH Beatrix Fetters;  Surgeon: Troy Sine, MD;  Location: [facility] CATH LAB;  Service: Cardiovascular;;  . LUMBAR DISC SURGERY    . open heart surgery  03/1998  . TONSILLECTOMY      Allergies: Other; Aspirin; and Oxycodone  Medications: Prior to Admission medications   Medication  Sig Start Date End Date Taking? Authorizing Provider  acetaminophen (TYLENOL) 500 MG tablet Take 500 mg by mouth every 6 (six) hours as needed for mild pain.    [provider]  albuterol (PROVENTIL HFA;VENTOLIN HFA) 108 (90 Base) MCG/ACT inhaler Inhale 1 puff into the lungs every 6 (six) hours as needed for wheezing or shortness of breath.    [provider]    apixaban (ELIQUIS) 5 MG TABS tablet Take 1 tablet (5 mg total) by mouth 2 (two) times daily. 05/01/16   Pixie Casino, MD  aspirin EC 81 MG EC tablet Take 1 tablet (81 mg total) by mouth daily. 12/22/12   Erlene Quan, PA-C  atorvastatin (LIPITOR) 80 MG tablet TAKE 1 TABLET DAILY AT 6 PM. 04/30/16   Hilty, Nadean Corwin, MD  Bromfenac Sodium (PROLENSA) 0.07 % SOLN Apply 1 drop to eye as directed.    [provider]  dofetilide (TIKOSYN) 500 MCG capsule Take 1 capsule (500 mcg total) by mouth 2 (two) times daily. 03/13/16   Pixie Casino, MD  furosemide (LASIX) 20 MG tablet TAKE 1 TABLET EVERY DAY 02/23/17   Hilty, Nadean Corwin, MD  isosorbide mononitrate (IMDUR) 30 MG 24 hr tablet Take 1 tablet (30 mg total) by mouth daily. 01/19/17   Hilty, Nadean Corwin, MD  ketorolac (ACULAR) 0.4 % SOLN Place 1 drop into the left eye as directed. 07/09/16   [provider]  KLOR-CON M20 20 MEQ tablet TAKE 1.5 TABLETS (30 MEQ TOTAL) BY MOUTH DAILY. 10/06/16   Sherran Needs, NP  lisinopril (PRINIVIL,ZESTRIL) 5 MG tablet Take 1 tablet (5 mg total) by mouth daily. Please keep your upcoming appointment (10/15/15) for refills. 10/08/15   Hilty, Nadean Corwin, MD  LORazepam (ATIVAN) 0.5 MG tablet Take 0.5 mg by mouth 2 (two) times daily.  01/07/13   Pixie Casino, MD  Magnesium 400 MG CAPS Take 400 mg by mouth 2 (two) times daily. 07/31/16   Hilty, Nadean Corwin, MD  metFORMIN (GLUCOPHAGE-XR) 750 MG 24 hr tablet Take 1 tablet by mouth daily. 11/23/15   [provider]  metoprolol tartrate (LOPRESSOR) 25 MG tablet Take 12.5 mg by mouth 2 (two) times daily. 07/01/16   [provider]  nitroGLYCERIN (NITROSTAT) 0.4 MG SL tablet Place 1 tablet (0.4 mg total) under the tongue every 5 (five) minutes as needed for chest pain. 12/22/12   Erlene Quan, PA-C  ranitidine (ZANTAC) 150 MG tablet Take 150 mg by mouth 2 (two) times daily.    [provider]     Family History  Problem Relation Age of  Onset  . CAD Mother   . Heart disease Mother   . CAD Brother   . Heart attack Brother   . Hyperlipidemia Sister   . Leukemia Father   . CAD Maternal Grandmother   . Brain cancer Brother   . CAD Sister     Social History   Social History  . Marital status: Divorced    Spouse name: N/A  . Number of children: 1  . Years of education: N/A   Occupational History  . 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 Smoking status: Former Smoker    Packs/day: 4.50    Years: 30.00    Quit date: 05/07/1987  . Smokeless tobacco: Former Systems developer    Types: Chew  . Alcohol use No  . Drug use: No  . Sexual  activity: Not on file   Other Topics Concern  . Not on file   Social History Narrative  . No narrative on file     Review of Systems: A 12 point ROS discussed and pertinent positives are indicated in the HPI above.  All other systems are negative.  Review of Systems  Constitutional: Negative.   HENT: Negative.   Respiratory: Negative.   Cardiovascular: Negative.   Gastrointestinal: Negative.   Genitourinary: Negative.   Musculoskeletal: Negative.   Neurological: Negative.     Vital Signs: BP 119/73   Pulse 62   Temp 98 F (36.7 C) (Oral)   Resp 16   Ht 6' (1.829 m)   Wt 290 lb (131.5 kg)   SpO2 94%   BMI 39.33 kg/m   Physical Exam  Constitutional: He is oriented to person, place, and time. He appears well-developed and well-nourished. No distress.  HENT:  Head: Normocephalic and atraumatic.  Neck: Neck supple. No JVD present. No tracheal deviation present.  Cardiovascular: Normal rate, regular rhythm and normal heart sounds.  Exam reveals no gallop and no friction rub.   No murmur heard. Pulmonary/Chest: Effort normal and breath sounds normal. No stridor. No respiratory distress. He has no wheezes. He has no rales.  Abdominal: Soft. Bowel sounds are normal. He exhibits no distension and no mass. There is no tenderness. There is no rebound and  no guarding.  Musculoskeletal: He exhibits no edema.  Lymphadenopathy:    He has no cervical adenopathy.  Neurological: He is alert and oriented to person, place, and time.  Skin: Skin is warm and dry. He is not diaphoretic.  Vitals reviewed.    Imaging: Ct Angio Chest Aorta W &/or Wo Contrast  Result Date: 03/09/2017 CLINICAL DATA:  History of endovascular repair of infrarenal abdominal aortic aneurysm. EXAM: CT ANGIOGRAPHY CHEST, ABDOMEN AND PELVIS TECHNIQUE: Multidetector CT imaging through the chest, abdomen and pelvis was performed using the standard protocol during bolus administration of intravenous contrast. Multiplanar reconstructed images and MIPs were obtained and reviewed to evaluate the vascular anatomy. CONTRAST:  75 cc Isovue 370 Creatinine was obtained on site at [facility] at [address] Results: Creatinine 1.0 mg/dL. COMPARISON:  CT the chest, abdomen and pelvis- 07/28/2016; 04/03/2016 abdominal CT - 01/17/2016 FINDINGS: CTA CHEST FINDINGS Vascular Findings: Stable mild fusiform aneurysmal dilatation of the ascending thoracic aorta was measurements as follows. The descending thoracic aorta is again noted to be tortuous but of normal caliber. There is a minimal amount of atherosclerotic plaque within the thoracic aorta, not resulting in hemodynamically significant stenosis. Conventional configuration of the aortic arch. The branch vessels of the aortic arch appear widely patent throughout their imaged course. Note is made of an azygos nipple. Post median sternotomy and CABG. Calcifications within native coronary arteries. No pericardial effusion. Although this examination was not tailored for the evaluation the pulmonary arteries, there are no discrete filling defects within the central pulmonary arterial tree to suggest central pulmonary embolism. Normal caliber of the main pulmonary artery. ------------------------------------------------------------- Thoracic aortic  measurements: Sinotubular junction 37 mm as measured in greatest oblique coronal dimension. Proximal ascending aorta 39 mm as measured in greatest oblique axial dimension at the level of the main pulmonary artery (image 55, series 5) an approximately 41 mm in greatest oblique coronal diameter (coronal image 79, series 603), grossly unchanged compared to the 08/2014 examination. Aortic arch aorta 31 mm as measured in greatest oblique sagittal dimension. Proximal descending thoracic aorta 30 mm as  measured in greatest oblique axial dimension at the level of the main pulmonary artery. Distal descending thoracic aorta 33 mm as measured in greatest oblique axial dimension at the level of the diaphragmatic hiatus. Review of the MIP images confirms the above findings. ------------------------------------------------------------- Non-Vascular Findings: Mediastinum/Lymph Nodes: No bulky mediastinal, hilar axillary lymphadenopathy. Lungs/Pleura: No new or enlarging pulmonary nodules. The approximately 7 mm noncalcified nodule within the lingula (image 79, series 9) is unchanged compared to the 08/2014 examination. Stability for greater than 2 years is indicative of benign etiology. Punctate granuloma within the right upper lobe adjacent to the right minor fissure (image 63, series 5) Russell similar to the 08/2014 examination. Minimal dependent subpleural ground-glass atelectasis. No discrete focal airspace opacities. No pleural effusion or pneumothorax. Musculoskeletal: No acute or aggressive osseous abnormalities. Stigmata of DISH within the thoracic spine. CTA ABDOMEN AND PELVIS FINDINGS VASCULAR Aorta: Post endovascular repair of infrarenal abdominal aortic aneurysm. The stent graft remains widely patent. The proximal end of the stent graft remains well apposed against the walls of the infrarenal abdominal aorta as well as the bilateral common iliac arteries. Crescentic mural calcifications are again noted within the  ventral aspect of the abdominal aorta. There is persistent opacification of the native abdominal aortic aneurysm sac compatible with a type 2 endoleak. While the definitive etiology of the endoleak is not identified, it is favored to be secondary to a combination of the left L3 lumbar artery (image 161, series 5), the IMA (image 169, series 5) and potentially the left L4 lumbar artery (image 176, series 5). The native aorta measures approximately 8.1 x 7.9 x 8.2 cm as measured in greatest oblique short axis axial (image 179, series 5), coronal (image 67, series 601) and sagittal (image 96, series 602) dimensions respectively, previously, 8.1 x 7.9 x 8.2 cm, when compared to the initial post stent abdominal CT performed 01/17/2016. No periaortic stranding. Celiac: There is a very minimal amount of mixed calcified and noncalcified atherosclerotic plaque involving the origin the celiac artery, not resulting in hemodynamically significant stenosis. Conventional branching pattern. SMA: Widely patent without hemodynamically significant narrowing. Conventional branching pattern. Renals: Duplicated, co-dominant right-sided renal arteries. All renal arteries appear widely patent without hemodynamically significant narrowing. No vessel irregularity to suggest FMD. IMA: Remains patent and likely contributes to the type 2 endoleak. 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 The right common iliac artery measures approximately 1.8 cm in maximal short axis oblique coronal diameter (coronal image 82, series 601. The right external iliac artery is tortuous but of normal caliber and without hemodynamically significant stenosis. The right internal iliac artery is of normal caliber and widely patent without hemodynamically significant narrowing. The left common iliac artery is aneurysmal measuring approximately 2.6 cm in maximal oblique short axis diameter (coronal image 82, series 601).  The right external iliac artery is tortuous but normal caliber and widely patent. Unchanged aneurysmal dilatation of the proximal aspect of the left internal iliac artery measuring approximately 2.1 cm (coronal image 89, series 601). Veins: The IVC and pelvic venous system appears widely patent. Review of the MIP images confirms the above findings. NON-VASCULAR Evaluation of the abdominal organs is largely limited to the arterial phase of enhancement. Hepatobiliary: Normal hepatic contour. No discrete hepatic lesions. Normal appearance of the gallbladder given underdistention. No radiopaque gallstones. No intra extrahepatic bili duct dilatation. No ascites. Pancreas: Normal appearance of the pancreas Spleen: Normal appearance of the spleen Adrenals/Urinary Tract: There is symmetric enhancement and excretion  of the bilateral kidneys. No renal stones. No discrete renal lesions. No urine obstruction or perinephric stranding. Normal appearance of the bilateral adrenal glands. Normal appearance of the urinary bladder given underdistention. Stomach/Bowel: Scattered colonic diverticulosis without evidence of diverticulitis. Moderate colonic stool burden without evidence of enteric obstruction. Normal appearance of the terminal ileum and appendix. No pneumoperitoneum, pneumatosis or portal venous gas. Lymphatic: No bulky retroperitoneal, mesenteric, pelvic or inguinal lymphadenopathy. Reproductive: Dystrophic calcifications within normal sized prostate gland. Other: Small bilateral mesenteric fat containing inguinal hernias. Tiny mesenteric fat containing periumbilical hernia. There is a minimal subcutaneous edema about the midline of the low back. Musculoskeletal: Moderate severe multilevel lumbar spine DDD, likely worse at T12-L1, L2-L3 and L4-L5 with disc space height loss, endplate irregularity and posteriorly directed disc osteophyte complexes at these locations. Punctate bone island is noted within the right femoral  head. Review of the MIP images confirms the above findings. IMPRESSION: Chest CTA Impression: 1. 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 2. Punctate (approximately 7 mm) pulmonary nodule within the lingula is unchanged compared to the 08/2014 examination and thus of benign etiology. CTA of the abdomen pelvis Impression: 1. 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 2. 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 3. 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 4. Colonic diverticulosis without evidence of diverticulitis. Electronically Signed   By: Sandi Mariscal M.D.   On: 03/09/2017 16:21   Ct Angio Abdomen Pelvis  W &/or Wo Contrast  Result Date: 03/09/2017 CLINICAL DATA:  History of endovascular repair of infrarenal abdominal aortic aneurysm. EXAM: CT ANGIOGRAPHY CHEST, ABDOMEN AND PELVIS TECHNIQUE: Multidetector CT imaging through the chest, abdomen and pelvis was performed using the standard protocol during bolus administration of intravenous contrast. Multiplanar reconstructed images and MIPs were obtained and reviewed to evaluate the vascular anatomy. CONTRAST:  75 cc Isovue 370 Creatinine was obtained on site at [facility] at [address] Results: Creatinine 1.0 mg/dL. COMPARISON:  CT the chest, abdomen  and pelvis- 07/28/2016; 04/03/2016 abdominal CT - 01/17/2016 FINDINGS: CTA CHEST FINDINGS Vascular Findings: Stable mild fusiform aneurysmal dilatation of the ascending thoracic aorta was measurements as follows. The descending thoracic aorta is again noted to be tortuous but of normal caliber. There is a minimal amount of atherosclerotic plaque within the thoracic aorta, not resulting in hemodynamically significant stenosis. Conventional configuration of the aortic arch. The branch vessels of the aortic arch appear widely patent throughout their imaged course. Note is made of an azygos nipple. Post median sternotomy and CABG. Calcifications within native coronary arteries. No pericardial effusion. Although this examination was not tailored for the evaluation the pulmonary arteries, there are no discrete filling defects within the central pulmonary arterial tree to suggest central pulmonary embolism. Normal caliber of the main pulmonary artery. ------------------------------------------------------------- Thoracic aortic measurements: Sinotubular junction 37 mm as measured in greatest oblique coronal dimension. Proximal ascending aorta 39 mm as  measured in greatest oblique axial dimension at the level of the main pulmonary artery (image 55, series 5) an approximately 41 mm in greatest oblique coronal diameter (coronal image 79, series 603), grossly unchanged compared to the 08/2014 examination. Aortic arch aorta 31 mm as measured in greatest oblique sagittal dimension. Proximal descending thoracic aorta 30 mm as measured in greatest oblique axial dimension at the level of the main pulmonary artery. Distal descending thoracic aorta 33 mm as measured in greatest oblique axial dimension at the level of the diaphragmatic hiatus. Review of the MIP images confirms the above findings. ------------------------------------------------------------- Non-Vascular Findings: Mediastinum/Lymph Nodes: No bulky mediastinal, hilar  axillary lymphadenopathy. Lungs/Pleura: No new or enlarging pulmonary nodules. The approximately 7 mm noncalcified nodule within the lingula (image 79, series 9) is unchanged compared to the 08/2014 examination. Stability for greater than 2 years is indicative of benign etiology. Punctate granuloma within the right upper lobe adjacent to the right minor fissure (image 63, series 5) Russell similar to the 08/2014 examination. Minimal dependent subpleural ground-glass atelectasis. No discrete focal airspace opacities. No pleural effusion or pneumothorax. Musculoskeletal: No acute or aggressive osseous abnormalities. Stigmata of DISH within the thoracic spine. CTA ABDOMEN AND PELVIS FINDINGS VASCULAR Aorta: Post endovascular repair of infrarenal abdominal aortic aneurysm. The stent graft remains widely patent. The proximal end of the stent graft remains well apposed against the walls of the infrarenal abdominal aorta as well as the bilateral common iliac arteries. Crescentic mural calcifications are again noted within the ventral aspect of the abdominal aorta. There is persistent opacification of the native abdominal aortic aneurysm sac compatible with a type 2 endoleak. While the definitive etiology of the endoleak is not identified, it is favored to be secondary to a combination of the left L3 lumbar artery (image 161, series 5), the IMA (image 169, series 5) and potentially the left L4 lumbar artery (image 176, series 5). The native aorta measures approximately 8.1 x 7.9 x 8.2 cm as measured in greatest oblique short axis axial (image 179, series 5), coronal (image 67, series 601) and sagittal (image 96, series 602) dimensions respectively, previously, 8.1 x 7.9 x 8.2 cm, when compared to the initial post stent abdominal CT performed 01/17/2016. No periaortic stranding. Celiac: There is a very minimal amount of mixed calcified and noncalcified atherosclerotic plaque involving the origin the celiac artery, not  resulting in hemodynamically significant stenosis. Conventional branching pattern. SMA: Widely patent without hemodynamically significant narrowing. Conventional branching pattern. Renals: Duplicated, co-dominant right-sided renal arteries. All renal arteries appear widely patent without hemodynamically significant narrowing. No vessel irregularity to suggest FMD. IMA: Remains patent and likely contributes to the type 2 endoleak. 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 The right common iliac artery measures approximately 1.8 cm in maximal short axis oblique coronal diameter (coronal image 82, series 601. The right external iliac artery is tortuous but of normal caliber and without hemodynamically significant stenosis. The right internal iliac artery is of normal caliber and widely patent without hemodynamically significant narrowing. The left common iliac artery is aneurysmal measuring approximately 2.6 cm in maximal oblique short axis diameter (coronal image 82, series 601). The right external iliac artery is tortuous but normal caliber and widely patent. Unchanged aneurysmal dilatation of the proximal aspect of the left internal iliac artery measuring approximately 2.1 cm (coronal image 89, series 601). Veins: The IVC and pelvic venous system appears widely patent. Review of the MIP images confirms the above findings. NON-VASCULAR Evaluation of  the abdominal organs is largely limited to the arterial phase of enhancement. Hepatobiliary: Normal hepatic contour. No discrete hepatic lesions. Normal appearance of the gallbladder given underdistention. No radiopaque gallstones. No intra extrahepatic bili duct dilatation. No ascites. Pancreas: Normal appearance of the pancreas Spleen: Normal appearance of the spleen Adrenals/Urinary Tract: There is symmetric enhancement and excretion of the bilateral kidneys. No renal stones. No discrete renal lesions. No urine  obstruction or perinephric stranding. Normal appearance of the bilateral adrenal glands. Normal appearance of the urinary bladder given underdistention. Stomach/Bowel: Scattered colonic diverticulosis without evidence of diverticulitis. Moderate colonic stool burden without evidence of enteric obstruction. Normal appearance of the terminal ileum and appendix. No pneumoperitoneum, pneumatosis or portal venous gas. Lymphatic: No bulky retroperitoneal, mesenteric, pelvic or inguinal lymphadenopathy. Reproductive: Dystrophic calcifications within normal sized prostate gland. Other: Small bilateral mesenteric fat containing inguinal hernias. Tiny mesenteric fat containing periumbilical hernia. There is a minimal subcutaneous edema about the midline of the low back. Musculoskeletal: Moderate severe multilevel lumbar spine DDD, likely worse at T12-L1, L2-L3 and L4-L5 with disc space height loss, endplate irregularity and posteriorly directed disc osteophyte complexes at these locations. Punctate bone island is noted within the right femoral head. Review of the MIP images confirms the above findings. IMPRESSION: Chest CTA Impression: 1. 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 2. Punctate (approximately 7 mm) pulmonary nodule within the lingula is unchanged compared to the 08/2014 examination and thus of benign etiology. CTA of the abdomen pelvis Impression: 1. 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 2. 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 3. 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 4. Colonic diverticulosis without evidence of diverticulitis. Electronically Signed   By: Sandi Mariscal M.D.   On: 03/09/2017 16:21    Labs:  CBC:  Recent Labs  04/03/16 1638  WBC 10.9*  HGB 13.5  HCT 43.0  PLT 243    COAGS:  Recent Labs  04/03/16 1638  INR 1.21    BMP:  Recent Labs  04/02/16 1603 04/03/16 1638 04/15/16 0933 05/06/16 1215 07/29/16 0933  NA 139 137 140 139 141  K 3.9 4.0 4.3 4.6 4.6  CL 106 100* 105 104 103  CO2 28 29 24 26 30   GLUCOSE 123* 149* 148* 146* 119*  BUN 15 14 20 18 17   CALCIUM 8.7* 9.3 8.5* 8.7 9.1  CREATININE 1.23 1.18 1.29* 1.37* 1.12  GFRNONAA 56* 59*  --   --   --   GFRAA >60 >60  --   --   --     LIVER FUNCTION TESTS:  Recent Labs  04/03/16 1734  BILITOT 1.2  AST 24  ALT 18  ALKPHOS 80  PROT 7.4  ALBUMIN 4.0    Assessment and Plan:  I met with Ryan Wise and his daughter. We reviewed all of the previously performed CTA studies. The aneurysm sac shows minimal enlargement between  the August, 2017 and March, 2018 scans. The sac has been stable in size between March and October of this year. 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 Lumbar retrograde flow has been implicated by CTA. However, the imaging does not demonstrate a clear source with the IMA also opacified up to its origin. This does not appear to represent a type I endoleak.  Options for treatment were reviewed including continued observation, transcatheter embolization techniques and embolization via a direct translumbar puncture of the aneurysm sac. Transcatheter technique would consist of arteriography via femoral approach to try to visualize internal iliac artery  collateral vessels reconstituting lumbar flow with possible catheterization of lumbar arteries and ultimately the aortic sac via collateral pathways.  This would then allow embolization of the endoleak. The superior mesenteric artery could also be studied to determine if the IMA is a potential pathway. Translumbar technique would be performed under general anesthesia with left translumbar puncture of the aneurysm sac at the level of visualized endoleak by CTA followed by sac embolization with coils and liquid embolic material.  A six-month follow-up CTA has been ordered by Dr. Trula Slade. Given stability over the last 7 months, I told Ryan Wise that I would also agree that we could wait another 6 months to determine how the endoleak looks and remeasure sac size by CTA. I did tell him to notify Dr. Trula Slade or myself should he develop any persistent abdominal pain in the meantime. I will plan on meeting with Ryan Wise after the follow-up CTA is performed to review findings.  Thank you for this interesting consult.  I greatly enjoyed meeting Ryan Wise and look forward to participating in their care.  A copy of this report was sent to the requesting provider on this date.  Electronically SignedAletta Edouard T 03/24/2017, 9:56 AM   I spent a total of 40 Minutes in face to face in clinical consultation, greater than 50% of which was counseling/coordinating care for a persistent type II endoleak following EVAR.

## 2017-03-31 ENCOUNTER — Other Ambulatory Visit: Payer: Self-pay | Admitting: Internal Medicine

## 2017-03-31 ENCOUNTER — Telehealth (HOSPITAL_COMMUNITY): Payer: Self-pay | Admitting: *Deleted

## 2017-03-31 NOTE — Telephone Encounter (Signed)
Patient's tikos[employer] patient assistance has expired on 03/19/17. Called daughter, Marcelino DusterMichelle, to inform need to re-apply.

## 2017-03-31 NOTE — Telephone Encounter (Signed)
Rx(s) sent to pharmacy electronically.  

## 2017-04-01 ENCOUNTER — Telehealth: Payer: Self-pay | Admitting: Internal Medicine

## 2017-04-01 NOTE — Telephone Encounter (Signed)
Contacted patient's daughter Elon JesterMichele regarding renewal for eliquis patient assistance thru [employer]. She would like to pick up application tomorrow at our office, complete and then return for us to fax in.

## 2017-04-07 NOTE — Telephone Encounter (Signed)
Patient's daughter dropped of eliquis patient assistance form. Called to inform her that a print out from pharmacy stating how much was spent on Rx(s) for 2018 will need to be provided in order to submit application.

## 2017-04-08 ENCOUNTER — Telehealth: Payer: Self-pay | Admitting: Internal Medicine

## 2017-04-08 NOTE — Telephone Encounter (Signed)
Returned call to patient's daughter Marcelino DusterMichelle.She wanted Dr.Hilty's RN to know she brought the paper that was missing on Eliquis application to office this morning.She wanted to make sure she received it.Stated application expires in December.She wanted to make sure all of application is sent in before then.Advised I will send message to Dr.Hilty's RN.

## 2017-04-08 NOTE — Telephone Encounter (Signed)
New Message  Pt daughter call requesting to speak with RN to inform that the paper work that was needed for pt was left at the front desk with Alcario DroughtErica. Please call back to discuss if needed

## 2017-04-09 NOTE — Telephone Encounter (Signed)
Faxed completed eliquis patient assistance application to [phone]

## 2017-04-09 NOTE — Telephone Encounter (Signed)
Notified daughter that eliquis patient assistance application was faxed in this AM and we would call when notice is received regarding approval/denial

## 2017-04-13 ENCOUNTER — Telehealth (HOSPITAL_COMMUNITY): Payer: Self-pay | Admitting: *Deleted

## 2017-04-13 NOTE — Telephone Encounter (Signed)
Tik[employer] patient assistance approved through 04/13/2018. Patient ID is [id number]. Order placed and will arrive in 7-10 days.

## 2017-04-15 ENCOUNTER — Encounter: Payer: Self-pay | Admitting: Interventional Radiology

## 2017-05-12 DIAGNOSIS — Z23 Encounter for immunization: Secondary | ICD-10-CM | POA: Diagnosis not present

## 2017-05-24 ENCOUNTER — Other Ambulatory Visit (HOSPITAL_COMMUNITY): Payer: Self-pay | Admitting: Nurse Practitioner

## 2017-05-28 IMAGING — CT CT ANGIO CHEST-ABD-PELV FOR DISSECTION W/ AND WO/W CM
2 of 7 series · 13 of 46 positions shown, 15 images · IV contrast (OMNI 350)
Comparison: Abdomen and pelvis CTA 01/17/2016. Chest CT 08/27/2014.

CLINICAL DATA: Upper chest pain and back pain for 1 day. Shortness
of breath. History of abdominal aortic aneurysm.

EXAM:
CT ANGIOGRAPHY CHEST, ABDOMEN AND PELVIS
TECHNIQUE: Multidetector CT imaging through the chest, abdomen and pelvis was
performed using the standard protocol during bolus administration of
intravenous contrast. Multiplanar reconstructed images and MIPs were
obtained and reviewed to evaluate the vascular anatomy.
CONTRAST:  100 cc Isovue 370

[Series 6: dissection 2mm · axial · 0.88mm/px · z∈[+682,+1280]mm · 10 of 335 slices shown, 12 images]
[im 18/335  soft-tissue]
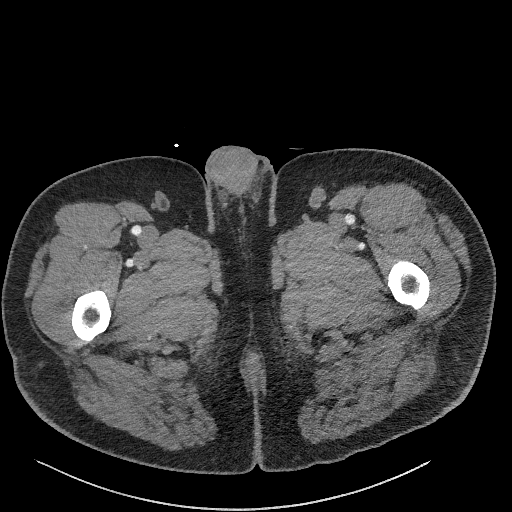
[im 18/335  bone]
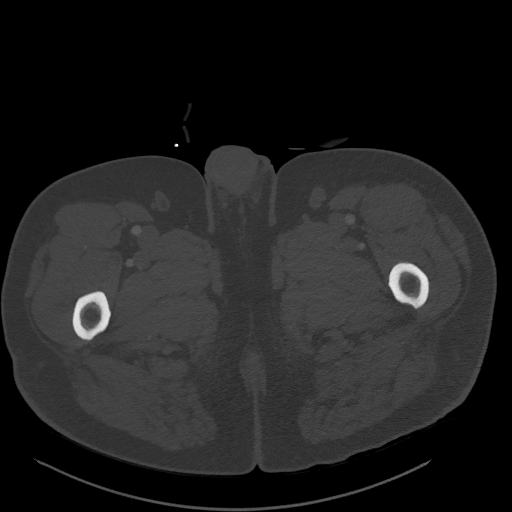
[im 53/335  soft-tissue]
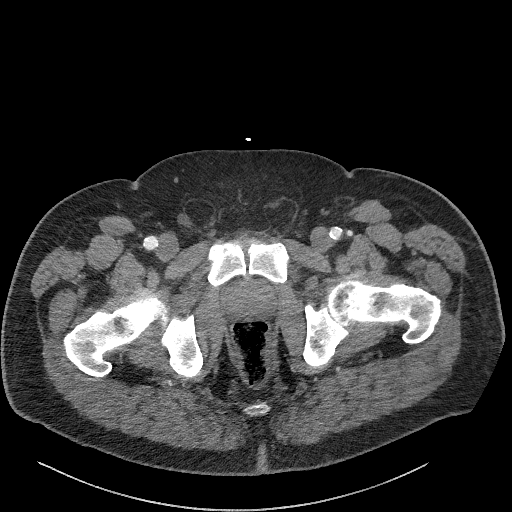
[im 88/335  soft-tissue]
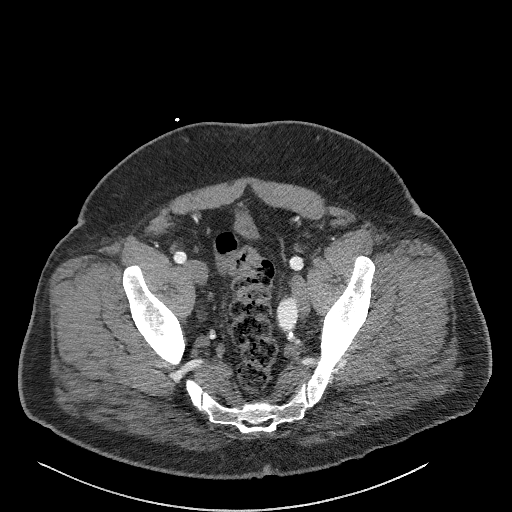
[im 124/335  soft-tissue]
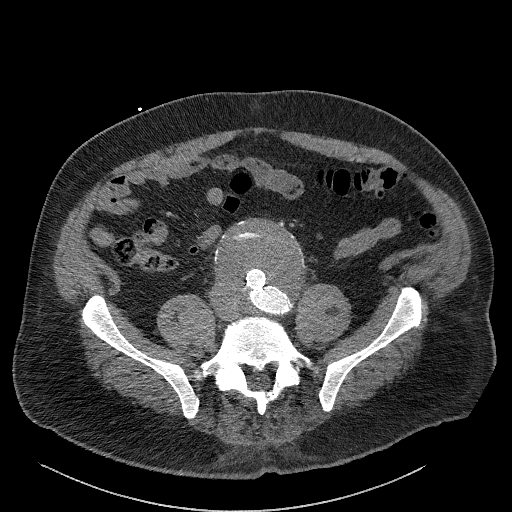
[im 159/335  soft-tissue]
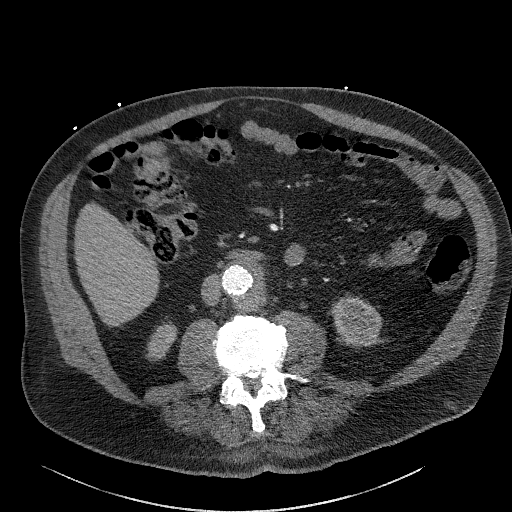
[im 176/335  soft-tissue]
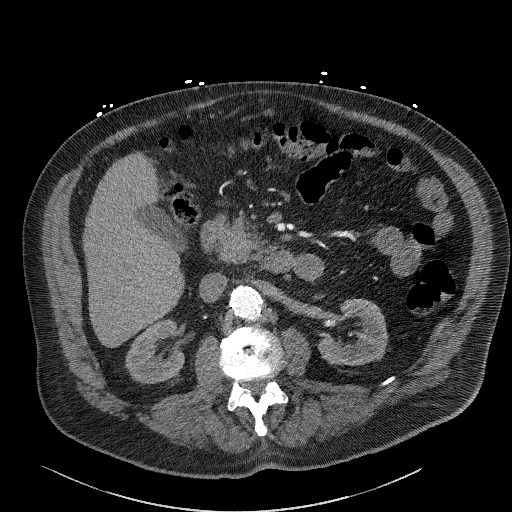
[im 211/335  soft-tissue]
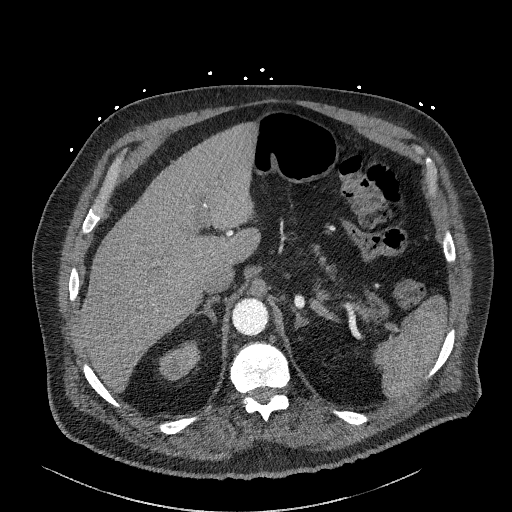
[im 247/335  soft-tissue]
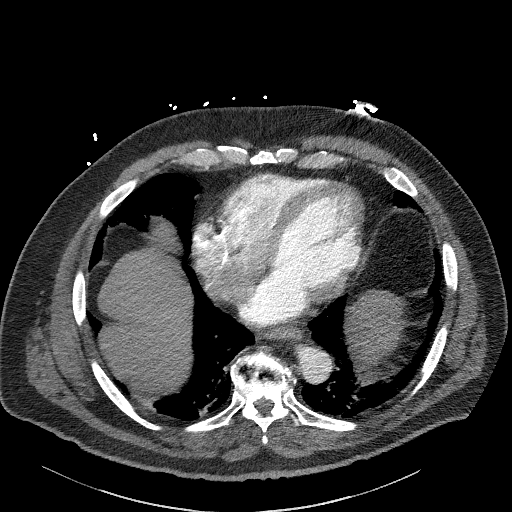
[im 282/335  soft-tissue]
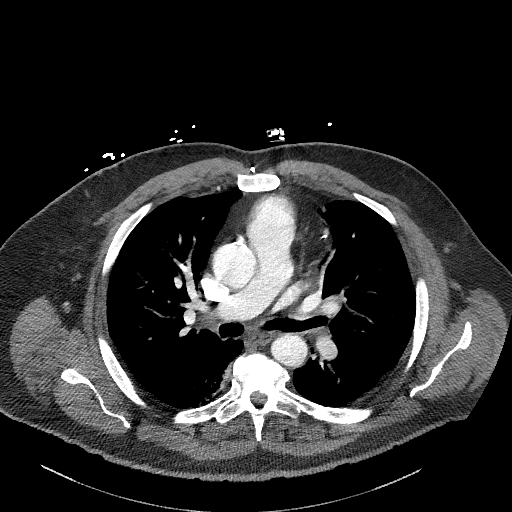
[im 282/335  bone]
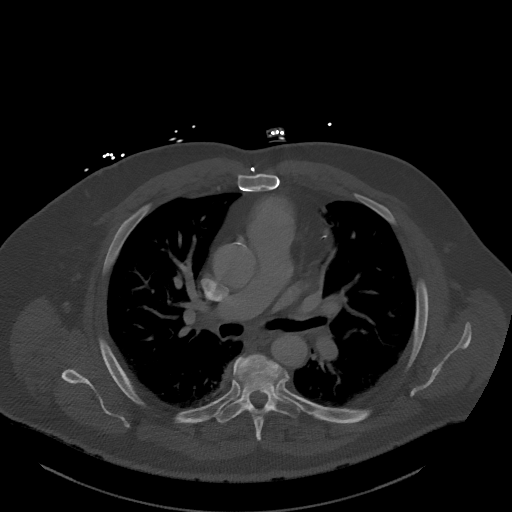
[im 317/335  soft-tissue]
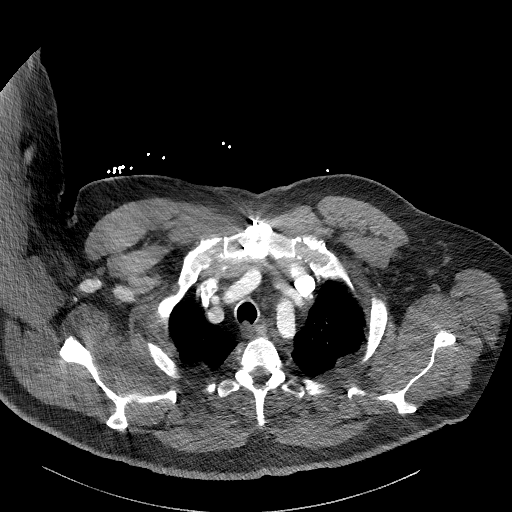

[Series 9: dissection 2mm cor · coronal · 0.90mm/px · 3 of 179 slices shown]
[im 45/179  soft-tissue]
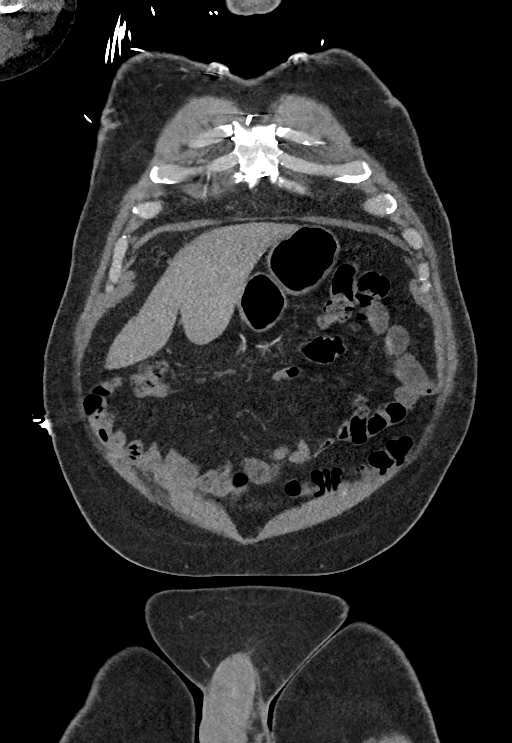
[im 90/179  soft-tissue]
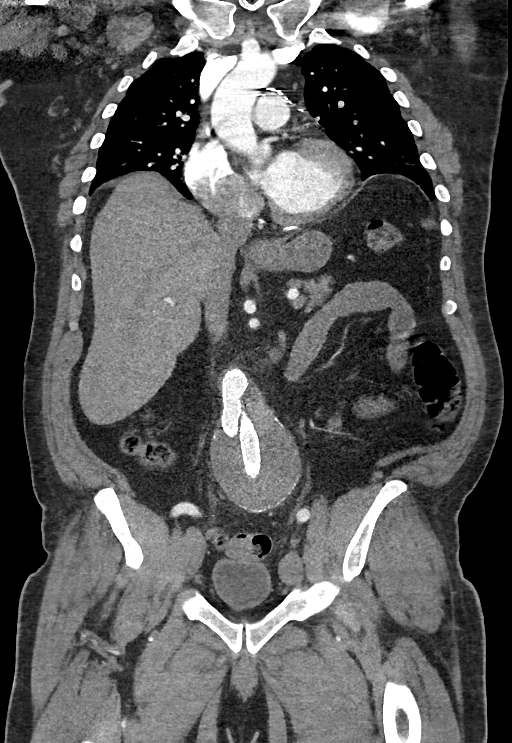
[im 134/179  soft-tissue]
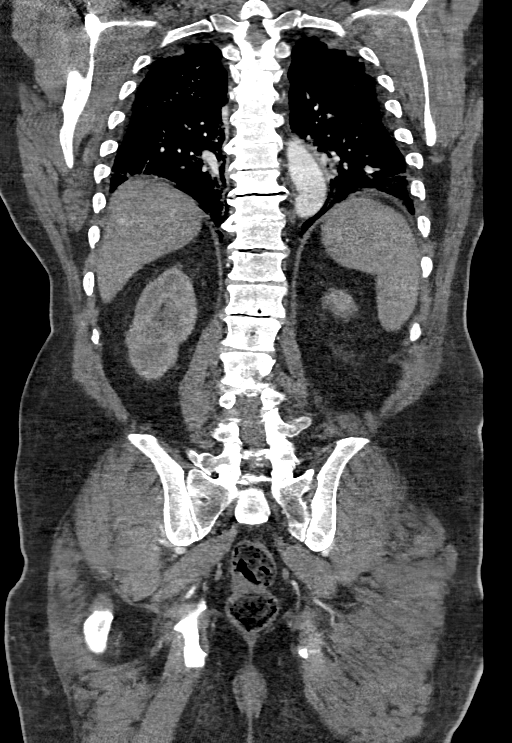

[13 of 46 positions shown; findings below may reference images not displayed]

FINDINGS: CTA CHEST FINDINGS

Cardiovascular: Precontrast imaging through the chest shows no
hyperdense crescent in the wall of the thoracic aorta suggest the
presence of an acute intramural hematoma. Post-contrast imaging
shows no thoracic aortic aneurysm. There is no dissection of the
thoracic aorta. Patient is status post CABG. Heart is enlarged. No
pericardial effusion. No large central pulmonary embolus.

Mediastinum/Nodes: No mediastinal lymphadenopathy. There is no hilar
lymphadenopathy. The esophagus has normal imaging features. There is
no axillary lymphadenopathy.

Lungs/Pleura: Tiny peripheral right upper lobe pulmonary nodule
stable since 08/27/2014. Subsegmental atelectasis noted right lower
lobe. 4 mm right middle lobe nodule seen on image 27 is unchanged. 8
mm nodule in the lingula not substantially changed since 08/27/2014.
Left lower lobe atelectasis noted. No pulmonary edema or substantial
pleural effusion.

Musculoskeletal: Bone windows reveal no worrisome lytic or sclerotic
osseous lesions.

Review of the MIP images confirms the above findings.

CTA ABDOMEN AND PELVIS FINDINGS

VASCULAR

Aorta: Abdominal aortic aneurysm again noted, status post
endovascular repair. Infrarenal aneurysm sac measures 8.4 cm maximum
AP diameter, unchanged in the interval since 01/17/2016. As before,
postcontrast imaging demonstrates subtle hyper attenuation in the
cranial aspect of the aneurysm sac which appears to represent type 2
endoleak from the inferior mesenteric artery. Both iliac limbs
opacified to a symmetric degree.

Celiac: Widely patent with no evidence for apparent branch vessel
anatomy.

SMA: Widely patent

Renals: Single left renal artery without definite stenotic lesion.
Two right renal arteries arise in close proximity in both vessels
opacified to a similar degree with no evidence for differential
perfusion of the right renal parenchyma.

IMA: See above.

Veins: Secondary to bolus timing, portal venous anatomy and systemic
venous anatomy show no opacification.

Review of the MIP images confirms the above findings.

NON-VASCULAR

Hepatobiliary: No focal intraparenchymal abnormality. There is no
evidence for gallstones, gallbladder wall thickening, or
pericholecystic fluid. No intrahepatic or extrahepatic biliary
dilation.

Pancreas: No focal mass lesion. No dilatation of the main duct. No
intraparenchymal cyst. No peripancreatic edema.

Spleen: No splenomegaly. No focal mass lesion.

Adrenals/Urinary Tract: Thickening of the adrenal glands noted
without a discrete nodule. No evidence for enhancing renal lesion.
No evidence for hydroureter. Bladder is decompressed.

Stomach/Bowel: Stomach is nondistended. No gastric wall thickening.
No evidence of outlet obstruction. Duodenum is normally positioned
as is the ligament of Treitz. No small bowel wall thickening. No
small bowel dilatation. The terminal ileum is normal. The appendix
is normal. No gross colonic mass. No colonic wall thickening. Mild
left diverticulosis without diverticulitis.

Vascular/Lymphatic: There is no gastrohepatic or hepatoduodenal
ligament lymphadenopathy. No intraperitoneal or retroperitoneal
lymphadenopathy. No pelvic sidewall lymphadenopathy. Ectasia of the
left internal iliac artery again noted.

Reproductive: The prostate gland and seminal vesicles have normal
imaging features.

Other: No intraperitoneal free fluid.

Musculoskeletal: Bone windows reveal no worrisome lytic or sclerotic
osseous lesions.

Review of the MIP images confirms the above findings.
IMPRESSION: 1. No evidence for thoracoabdominal aortic dissection.
2. Status post endograft repair of infrarenal abdominal aortic
aneurysm. Stable appearance of native aneurysm sac and type 2
endoleak originating from the inferior mesenteric artery.
3. 8 mm nodule in the lingula is stable since 08/27/2014. One year
of imaging stability is reassuring. Consider repeat CT chest in 12
months to document 2 years of stable imaging follow-up.
4. Mild left colonic diverticulosis without diverticulitis.

## 2017-05-29 DIAGNOSIS — N182 Chronic kidney disease, stage 2 (mild): Secondary | ICD-10-CM | POA: Diagnosis not present

## 2017-05-29 DIAGNOSIS — E785 Hyperlipidemia, unspecified: Secondary | ICD-10-CM | POA: Diagnosis not present

## 2017-05-29 DIAGNOSIS — Z0000 Encounter for general adult medical examination without abnormal findings: Secondary | ICD-10-CM | POA: Diagnosis not present

## 2017-05-29 DIAGNOSIS — I5032 Chronic diastolic (congestive) heart failure: Secondary | ICD-10-CM | POA: Diagnosis not present

## 2017-05-29 DIAGNOSIS — Z125 Encounter for screening for malignant neoplasm of prostate: Secondary | ICD-10-CM | POA: Diagnosis not present

## 2017-05-29 DIAGNOSIS — E559 Vitamin D deficiency, unspecified: Secondary | ICD-10-CM | POA: Diagnosis not present

## 2017-05-29 DIAGNOSIS — I129 Hypertensive chronic kidney disease with stage 1 through stage 4 chronic kidney disease, or unspecified chronic kidney disease: Secondary | ICD-10-CM | POA: Diagnosis not present

## 2017-06-05 DIAGNOSIS — I5032 Chronic diastolic (congestive) heart failure: Secondary | ICD-10-CM | POA: Diagnosis not present

## 2017-06-05 DIAGNOSIS — I129 Hypertensive chronic kidney disease with stage 1 through stage 4 chronic kidney disease, or unspecified chronic kidney disease: Secondary | ICD-10-CM | POA: Diagnosis not present

## 2017-06-05 DIAGNOSIS — E1122 Type 2 diabetes mellitus with diabetic chronic kidney disease: Secondary | ICD-10-CM | POA: Diagnosis not present

## 2017-06-05 DIAGNOSIS — I2510 Atherosclerotic heart disease of native coronary artery without angina pectoris: Secondary | ICD-10-CM | POA: Diagnosis not present

## 2017-06-08 ENCOUNTER — Encounter: Payer: Self-pay | Admitting: *Deleted

## 2017-06-25 ENCOUNTER — Telehealth (HOSPITAL_COMMUNITY): Payer: Self-pay | Admitting: *Deleted

## 2017-06-25 NOTE — Telephone Encounter (Signed)
Patient approved for tikos[employer] assistance through 04/13/2018. Patient ID #[id number]. Daughter notified

## 2017-07-28 ENCOUNTER — Other Ambulatory Visit: Payer: Self-pay

## 2017-07-28 ENCOUNTER — Telehealth: Payer: Self-pay | Admitting: Surgery

## 2017-07-28 ENCOUNTER — Other Ambulatory Visit: Payer: Self-pay | Admitting: Surgery

## 2017-07-28 DIAGNOSIS — I714 Abdominal aortic aneurysm, without rupture, unspecified: Secondary | ICD-10-CM

## 2017-07-28 DIAGNOSIS — I6529 Occlusion and stenosis of unspecified carotid artery: Secondary | ICD-10-CM

## 2017-07-28 NOTE — Telephone Encounter (Signed)
Left message for the patient and also spoke to daughter regarding the patient's upcoming appt on 09/21/17 at 11am for a carotid US and to see VWB on 11:45am. Dr.Brabham requested that the pt have a CTA prior to seeing him. I scheduled the CTA on 09/21/17 at 9:30am on 09/21/17 at the [address] [facility]. He is have no solid foods 4 hours prior but liquids are fine.I also mailed a letter to the patient with the above information as well. awt

## 2017-08-24 ENCOUNTER — Other Ambulatory Visit (HOSPITAL_COMMUNITY): Payer: Self-pay | Admitting: Interventional Radiology

## 2017-08-24 DIAGNOSIS — IMO0001 Reserved for inherently not codable concepts without codable children: Secondary | ICD-10-CM

## 2017-08-24 DIAGNOSIS — T82330S Leakage of aortic (bifurcation) graft (replacement), sequela: Secondary | ICD-10-CM

## 2017-09-02 ENCOUNTER — Ambulatory Visit (INDEPENDENT_AMBULATORY_CARE_PROVIDER_SITE_OTHER): Payer: Medicare Other | Admitting: Internal Medicine

## 2017-09-02 ENCOUNTER — Encounter: Payer: Self-pay | Admitting: Internal Medicine

## 2017-09-02 VITALS — BP 132/76 | HR 70 | Ht 72.0 in | Wt 294.0 lb

## 2017-09-02 DIAGNOSIS — Z79899 Other long term (current) drug therapy: Secondary | ICD-10-CM | POA: Diagnosis not present

## 2017-09-02 DIAGNOSIS — I4891 Unspecified atrial fibrillation: Secondary | ICD-10-CM

## 2017-09-02 DIAGNOSIS — Z5181 Encounter for therapeutic drug level monitoring: Secondary | ICD-10-CM | POA: Diagnosis not present

## 2017-09-02 DIAGNOSIS — I255 Ischemic cardiomyopathy: Secondary | ICD-10-CM

## 2017-09-02 NOTE — Patient Instructions (Signed)
Your physician wants you to follow-up in: ONE YEAR with Dr. Hilty. You will receive a reminder letter in the mail two months in advance. If you don't receive a letter, please call our office to schedule the follow-up appointment.  

## 2017-09-02 NOTE — Progress Notes (Signed)
OFFICE NOTE  Chief Complaint:  No complaints  Primary Care Physician: Pearson Grippe, MD  HPI:  Ryan Wise is 77 y/o with a history of CAD, s/p CABG X 4 in 1999 with an LIMA-LAD, SVG-OM, SVG-Dx, SVG-RCA. He presented to [facility] 12/18/12 with a STEMI and was taken to the cath lab by Dr Tresa Endo. This revealed the LIMA to the LAD to be patent with collaterals to the Dx. The SVG- OM: was occluded and the SVG- DX: was occluded The culprit appeared to be occlusion of SVG to distal RCA with no flow proximal due to anastomis occlusion with thrombus. He underwent a very difficult PCI with PTCA/ thrombectomy. He had no flow phenomenon requiring IC/IV NTG/IC verapamil/ angiomax, brilinta 180 mg, integrelin. He ultimately received a Xience Xpedition stent to distal anastomosis. He did have acute, transient CHF but stabilized with diuresis. He did well and was transferred to telemetry on 12/21/12.. Troponin was greater than 20. EF at cath was 35%. EF by echo was 35=40% with severe hypokinesis of the basal inferolateral myocardium; moderate hypokinesis of the basal-mid inferior and mid inferolateral myocardium; and mild hypokinesis of the basal-mid inferoseptal and apical septal myocardium. After he was transferred to telemetry he awaked early in the morning of the 30th with neck pain. He was transferred back to the ICU by the cardiology fellow on call. The pt was seen and examined by Dr Rennis Golden the morning of the 29th. Dr Rennis Golden did not feel his symptoms were cardiac. We added a NSAID and Skelaxin. He took this for a day or 2 he reported his neck pain improved significantly.  Today is here for followup and appears to be doing fairly well. He does report some shortness of breath doing moderate to more intense activities which he is trying to avoid. He did some lawn mowing which made her short of breath after a while. 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 He does feel that his energy level has  improved significantly and hopefully this means that his EF has come up as well. He continues to take naproxen on a daily basis, however it was intended that he only take this for a short period time for his neck pain. He did however discontinue Skelaxin. I reviewed recent laboratory work from his primary care provider today including a cholesterol profile. I was surprised to see how low that was, noting that his total cholesterol was 80, triglycerides 107 HDL 35 and LDL of 24. This is on atorvastatin 80 mg daily.  I saw Ryan Wise back in the office today. He continues to do very well. He is asymptomatic this had no significant weight gain or worsening shortness of breath. He denies any extremity edema. Blood pressure is very well-controlled today 122/60. He recently had laboratory work through his primary care provider which shows excellent cholesterol control. He does have a history of cardiomyopathy and is due for repeat echocardiogram.   Ryan Wise returns today for follow-up. Overall he is doing exceedingly well. He denies any worsening chest pain or shortness of breath. He's done well on his current medicines. Cholesterol is been well controlled. 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 Recent echocardiogram earlier this year shows an improvement in EF up to 45%.  01/31/2016  Ryan Wise returns today for follow-up. Unfortunately recently presented to the emergency department with abdominal pain, nausea and vomiting as well as diarrhea and was thought to have a  gastroenteritis. 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 Of note, he had an ultrasound of the abdomen in April 2016 which the radiologist commented that "the abdominal aorta is poorly seen due to overlying bowel gas". Subsequently he underwent EVAR which is complicated by a small type II endoleak. Since surgery Mr. Rozo is felt somewhat fatigued and does get short of  breath with some minimal exertion. In the office today he was noted to be in atrial fibrillation with a slow ventricular response at 47. This is a new diagnosis for him. He is on low-dose metoprolol, given his history of cardiomyopathy. He only takes aspirin for his current anticoagulation. 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  02/14/2016  Ryan Wise returns today for follow-up. He has done well with discontinuing his beta blocker. Heart rate is now improved up to 58. He remains in atrial fibrillation. He does not feel quite as fatigued but does notice some fatigue. He seems to be tolerating Eliquis without any bleeding problems. I did review his recent endoleak with Dr. Myra Gianotti who felt it was okay to start Eliquis in the setting of recent EVAR. At this point the next step would be to try to obtain a normal sinus rhythm. He will need to be anticoagulated for at least 3-4 weeks prior to cardioversion.  04/15/2016  Ryan Wise returns today for follow-up. He underwent outpatient cardioversion by myself which was unsuccessful. I therefore arranged for him to go on Tikosyn therapy. He was admitted and loaded on 500 g twice daily of Tikosyn and required cardioversion to get to sinus rhythm. This was successful and he has maintained sinus rhythm since then. He was discharged and followed up in the A. fib clinic. He was noted to have some hypokalemia and is due for repeat check of his potassium and magnesium. It was recommended that he have EKGs every 3 months and repeat of potassium and magnesium as per prescribing guidelines. He recently was seen in the emergency department for a "stabbing" mid upper back pain. This was felt to be reproducible and musculoskeletal. He did have a CT scan which ruled out dissection. He was given muscle relaxants and noted improvement with that and Tylenol.  07/31/2016  Ryan Wise returns today for follow-up. Overall he seems to be doing  well. EKG shows sinus rhythm today. QTC of 469 ms. He is on Tikosyn 500 g twice a day. He is potassium 2 days ago was 4.6 however magnesium was slightly low at 1.9. He is on magnesium oxide 400 mg daily. He denies any chest pain or worsening shortness of breath. There has been about a 6 pound weight gain however he reports has not been as active. He recently had bilateral cataract surgery for which she is recovering. He also saw Dr. Myra Gianotti recently, who noted that he had a small endoleak from his AAA repair but he will continue to monitor.  02/16/2017  Mr. Savas returns today for follow-up. He seems to be doing really well. His echo recent showed improvement in LV function with an EF increased to 45-50%. He denies any recurrent atrial fibrillation. He appears to be in either a low atrial accelerated junctional rhythm today is 69. QTC is 467 ms. He is on dofetilide 500 g twice a day. He also takes Eliquis and denies any bleeding problems. He's scheduled for follow-up with Dr. Myra Gianotti for an endoleak of his AAA repair. 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  09/02/2017  Mr. Neil CrouchCrane was seen today in follow-up.  He denies any new symptoms.  He seems to be maintaining sinus on dofetilide.  His QTC today is 490 ms.  He had lab work in January which showed normal creatinine of 1.09.  Total cholesterol was 91 with LDL of 17.  Triglycerides were elevated to 21.  Hemoglobin A1c 6.5.  He continues to follow with Dr. Myra GianottiBrabham as well as interventional radiology for an endoleak of his prior AAA repair.  He is on apixaban for stroke risk reduction.  He denies chest pain or worsening shortness of breath.  PMHx:  Past Medical History:  Diagnosis Date  . Aneurysm of infrarenal abdominal aorta (HCC)    a. 01/2016 s/p endovascular repair of AAA (EVAR).  . Arthritis   . Chronic combined systolic and diastolic CHF (congestive  heart failure) (HCC)    a. 01/2016 Echo: EF 35-40%, diff HK, mildly dil Ao root, mild MR, mildly dil LA.  Marland Kitchen. Coronary artery disease    a. 1999 s/p CABG x4 (LIMA->LAD, VG->OM, VG->Diag, VG->RCA); b. 11/2012 Inferior STEMI/PCI: LIMA->LAD ok, VG->Diag 100, VG->OM 100, VG->RCA was culprit (PTCA/thrombectomy->Xience Xpedition DES), EF 25%.  Marland Kitchen. GERD (gastroesophageal reflux disease)   . High triglycerides   . Hypertension   . Ischemic Cardiomyopathy    a. 01/2016 Echo: EF 35-40%, diff HK, inflat, inf AK.  . Obesity   . Persistent atrial fibrillation (HCC)    a. Dx 01/2016-->slow vent response on beta blocker;  b. CHA2DS2VASc = 6-->Eliquis;  c. 03/03/2016 unsuccessful DCCV.  . Ruptured lumbar disc   . ST elevation myocardial infarction (STEMI) of inferior wall (HCC) 12/18/12   STEMI of inf. wall w/PCI with Xperdition stent to VG to distal RCA  . Type II diabetes mellitus (HCC)     Past Surgical History:  Procedure Laterality Date  . BACK SURGERY     (5) back surgeries  . CARDIOVERSION N/A 03/03/2016   Procedure: CARDIOVERSION;  Surgeon: Chrystie NoseKenneth C , MD;  [facility];  Service: Cardiovascular;  Laterality: N/A;  . CARDIOVERSION N/A 03/12/2016   Procedure: CARDIOVERSION;  Surgeon: Chilton Siiffany , MD;  [facility];  Service: Cardiovascular;  Laterality: N/A;  . CATARACT EXTRACTION, BILATERAL Right 07/10/2016   left eye 07/24/16  . CORONARY ARTERY BYPASS GRAFT  1999   (CAD) CABG was a 5-vessel bypass and had a questionable history of A fib. He underwent monitoring whick showed sinus bradycardia and PACs but no evidence of A fib.  . ENDOVASCULAR STENT INSERTION N/A 12/28/2015   Procedure: ENDOVASCULAR STENT GRAFT INSERTION;  Surgeon: Nada LibmanVance W Brabham, MD;  Location: [facility] OR;  Service: Vascular;  Laterality: N/A;  . IR RADIOLOGIST EVAL & MGMT  03/24/2017  . LEFT HEART CATHETERIZATION WITH CORONARY/GRAFT ANGIOGRAM  12/18/2012   Procedure: LEFT HEART CATHETERIZATION WITH Isabel CapriceORONARY/GRAFT  ANGIOGRAM;  Surgeon: Lennette Biharihomas A Kelly, MD;  [facility];  Service: Cardiovascular;;  . LUMBAR DISC SURGERY    . open heart surgery  03/1998  . TONSILLECTOMY      FAMHx:  Family History  Problem Relation Age of Onset  . CAD Mother   . Heart disease Mother   . CAD Brother   . Heart attack Brother   . Hyperlipidemia Sister   . Leukemia Father   . CAD Maternal Grandmother   . Brain cancer Brother   .  CAD Sister     SOCHx:   reports that he quit smoking about 30 years ago. He has a 135.00 pack-year smoking history. He has quit using smokeless tobacco. His smokeless tobacco use included chew. He reports that he does not drink alcohol or use drugs.  ALLERGIES:  Allergies  Allergen Reactions  . Other Other (See Comments)    Pain medication that he was given after open heart surgery-sweating and hallucinations   . Aspirin Nausea And Vomiting    Can't take full strength uncoated aspirin  . Oxycodone Other (See Comments)    Hallucinations    ROS: Pertinent items noted in HPI and remainder of comprehensive ROS otherwise negative.  HOME MEDS: Current Outpatient Medications  Medication Sig Dispense Refill  . acetaminophen (TYLENOL) 500 MG tablet Take 500 mg by mouth every 6 (six) hours as needed for mild pain.    Marland Kitchen albuterol (PROVENTIL HFA;VENTOLIN HFA) 108 (90 Base) MCG/ACT inhaler Inhale 1 puff into the lungs every 6 (six) hours as needed for wheezing or shortness of breath.    Marland Kitchen apixaban (ELIQUIS) 5 MG TABS tablet Take 1 tablet (5 mg total) by mouth 2 (two) times daily. 180 tablet 3  . aspirin EC 81 MG EC tablet Take 1 tablet (81 mg total) by mouth daily.    Marland Kitchen atorvastatin (LIPITOR) 80 MG tablet TAKE 1 TABLET DAILY AT 6 PM. 30 tablet 5  . Bromfenac Sodium (PROLENSA) 0.07 % SOLN Apply 1 drop to eye as directed.    . dofetilide (TIKOSYN) 500 MCG capsule Take 1 capsule (500 mcg total) by mouth 2 (two) times daily. 14 capsule 0  . furosemide (LASIX) 20 MG tablet TAKE 1 TABLET  EVERY DAY 90 tablet 3  . isosorbide mononitrate (IMDUR) 30 MG 24 hr tablet TAKE 1 TABLET BY MOUTH EVERY DAY 30 tablet 11  . ketorolac (ACULAR) 0.4 % SOLN Place 1 drop into the left eye as directed.  1  . KLOR-CON M20 20 MEQ tablet TAKE 1.5 TABLETS (30 MEQ TOTAL) BY MOUTH DAILY. 45 tablet 4  . lisinopril (PRINIVIL,ZESTRIL) 5 MG tablet TAKE 1 TABLET (5 MG TOTAL) BY MOUTH DAILY. 30 tablet 11  . LORazepam (ATIVAN) 0.5 MG tablet Take 0.5 mg by mouth 2 (two) times daily.     . Magnesium 400 MG CAPS Take 400 mg by mouth 2 (two) times daily. 60 capsule 5  . metFORMIN (GLUCOPHAGE-XR) 750 MG 24 hr tablet Take 1 tablet by mouth daily.  1  . metoprolol tartrate (LOPRESSOR) 25 MG tablet Take 12.5 mg by mouth 2 (two) times daily.  1  . nitroGLYCERIN (NITROSTAT) 0.4 MG SL tablet Place 1 tablet (0.4 mg total) under the tongue every 5 (five) minutes as needed for chest pain. 25 tablet 3  . ranitidine (ZANTAC) 150 MG tablet Take 150 mg by mouth 2 (two) times daily.     No current facility-administered medications for this visit.     LABS/IMAGING: No results found for this or any previous visit (from the past 48 hour(s)). No results found.  VITALS: BP 132/76 (BP Location: Left Arm, Patient Position: Sitting, Cuff Size: Large)   Pulse 70   Ht 6' (1.829 m)   Wt 294 lb (133.4 kg)   BMI 39.87 kg/m   EXAM: General appearance: alert and no distress Neck: no carotid bruit, no JVD and thyroid not enlarged, symmetric, no tenderness/mass/nodules Lungs: clear to auscultation bilaterally Heart: regular rate and rhythm, S1, S2 normal, no murmur, click, rub or  gallop Abdomen: soft, non-tender; bowel sounds normal; no masses,  no organomegaly Extremities: extremities normal, atraumatic, no cyanosis or edema Pulses: 2+ and symmetric Skin: Skin color, texture, turgor normal. No rashes or lesions Neurologic: Grossly normal Psych: Pleasant  EKG: Sinus rhythm at 70, inferior infarct pattern-personally  reviewed  ASSESSMENT: 1. New onset atrial fibrillation with slow ventricular response-CHADSVASC score of 5 2. Chronic Tikosyn therapy 3. Recent AAA status post -EVAR with type II endoleak 4. Coronary artery disease status post CABG with ST elevation MI and stent placement to the vein graft to PDA, with occluded grafts to the OM and diagonal vessels and a patent LIMA to LAD 5. Ischemic cardiac myopathy EF 45% (improved to 45-50% by echo in 01/2017) 6. Hypertension 7. Dyslipidemia  PLAN: 1.   Mr. Neil Crouch has not had recurrent A. fib on dofetilide.  His QTC is stable.  He is anticoagulated on Eliquis.  He denies bleeding problems although does have a small endoleak from his aortic aneurysm repair.  This will be reassessed later this month and follow-up is with Dr. Myra Gianotti.  Blood pressure is well controlled.  He needs to continue to work on weight loss.  Cholesterol is excellent at this point although triglycerides are mildly elevated.  I would encourage him to continue to watch intake of sugar.  I am not compelled to add treatment we could consider low-dose Vascepa based on results from the recent reduce-IT trial.  We will plan repeat labs in 6 months and follow-up with me in 1 year or sooner as necessary.  Chrystie Nose, MD, [facility], FACP  [place]  [facility]  Medical Director of the [facility] Lipid [facility] &  Cardiovascular Risk Reduction Clinic Diplomate of the American Board of Clinical Lipidology Attending Cardiologist  Direct Dial: [phone]  Fax: [phone]  Website:  [email or web address]  09/02/2017, 9:30 AM

## 2017-09-03 DIAGNOSIS — H353131 Nonexudative age-related macular degeneration, bilateral, early dry stage: Secondary | ICD-10-CM | POA: Diagnosis not present

## 2017-09-03 DIAGNOSIS — H35372 Puckering of macula, left eye: Secondary | ICD-10-CM | POA: Diagnosis not present

## 2017-09-03 DIAGNOSIS — H18413 Arcus senilis, bilateral: Secondary | ICD-10-CM | POA: Diagnosis not present

## 2017-09-12 ENCOUNTER — Telehealth: Payer: Self-pay | Admitting: Cardiology

## 2017-09-12 NOTE — Telephone Encounter (Signed)
Patient called stating he was exposed to someone with influenza and now has cough and wheezing.  He is worried he has the flu.  Instructed patient to go to ER to be evaluated further and he agrees

## 2017-09-21 ENCOUNTER — Ambulatory Visit (INDEPENDENT_AMBULATORY_CARE_PROVIDER_SITE_OTHER): Payer: Medicare Other | Admitting: Surgery

## 2017-09-21 ENCOUNTER — Ambulatory Visit (HOSPITAL_COMMUNITY)
Admission: RE | Admit: 2017-09-21 | Discharge: 2017-09-21 | Disposition: A | Discharge status: Home / Self Care | Payer: Medicare Other | Source: Ambulatory Visit | Attending: Surgery | Admitting: Surgery

## 2017-09-21 ENCOUNTER — Other Ambulatory Visit: Payer: Self-pay | Admitting: Surgery

## 2017-09-21 ENCOUNTER — Encounter: Payer: Self-pay | Admitting: Surgery

## 2017-09-21 ENCOUNTER — Ambulatory Visit
Admission: RE | Admit: 2017-09-21 | Discharge: 2017-09-21 | Disposition: A | Discharge status: Home / Self Care | Payer: Medicare Other | Source: Ambulatory Visit | Attending: Surgery | Admitting: Surgery

## 2017-09-21 VITALS — BP 112/68 | HR 74 | Temp 98.3°F | Resp 17 | Ht 74.0 in | Wt 294.4 lb

## 2017-09-21 DIAGNOSIS — I10 Essential (primary) hypertension: Secondary | ICD-10-CM | POA: Diagnosis not present

## 2017-09-21 DIAGNOSIS — I714 Abdominal aortic aneurysm, without rupture, unspecified: Secondary | ICD-10-CM

## 2017-09-21 DIAGNOSIS — E785 Hyperlipidemia, unspecified: Secondary | ICD-10-CM | POA: Diagnosis not present

## 2017-09-21 DIAGNOSIS — E119 Type 2 diabetes mellitus without complications: Secondary | ICD-10-CM | POA: Insufficient documentation

## 2017-09-21 DIAGNOSIS — I6523 Occlusion and stenosis of bilateral carotid arteries: Secondary | ICD-10-CM | POA: Diagnosis not present

## 2017-09-21 DIAGNOSIS — I2510 Atherosclerotic heart disease of native coronary artery without angina pectoris: Secondary | ICD-10-CM | POA: Diagnosis not present

## 2017-09-21 DIAGNOSIS — I6529 Occlusion and stenosis of unspecified carotid artery: Secondary | ICD-10-CM

## 2017-09-21 MED ORDER — IOPAMIDOL (ISOVUE-370) INJECTION 76%
75.0000 mL | Freq: Once | INTRAVENOUS | Status: AC | PRN
  Administered 2017-09-21: 75 mL via INTRAVENOUS

## 2017-09-21 NOTE — Progress Notes (Signed)
[facility] Vein Specialist of [facility]  Patient name: Taras Rask MRN: [id number] DOB: May 01, 1941 Sex: male   REASON FOR VISIT:    Follow up  HISOTRY OF PRESENT ILLNESS:    Ritchard Paragas is a 77 y.o. male who is s/p EVAR on 12-27-2015 for a 8.7 cm AAA.   PAST MEDICAL HISTORY:   Past Medical History:  Diagnosis Date  . Aneurysm of infrarenal abdominal aorta (HCC)    a. 01/2016 s/p endovascular repair of AAA (EVAR).  . Arthritis   . Chronic combined systolic and diastolic CHF (congestive heart failure) (HCC)    a. 01/2016 Echo: EF 35-40%, diff HK, mildly dil Ao root, mild MR, mildly dil LA.  Marland Kitchen Coronary artery disease    a. 1999 s/p CABG x4 (LIMA->LAD, VG->OM, VG->Diag, VG->RCA); b. 11/2012 Inferior STEMI/PCI: LIMA->LAD ok, VG->Diag 100, VG->OM 100, VG->RCA was culprit (PTCA/thrombectomy->Xience Xpedition DES), EF 25%.  Marland Kitchen GERD (gastroesophageal reflux disease)   . High triglycerides   . Hypertension   . Ischemic Cardiomyopathy    a. 01/2016 Echo: EF 35-40%, diff HK, inflat, inf AK.  . Obesity   . Persistent atrial fibrillation (HCC)    a. Dx 01/2016-->slow vent response on beta blocker;  b. CHA2DS2VASc = 6-->Eliquis;  c. 03/03/2016 unsuccessful DCCV.  . Ruptured lumbar disc   . ST elevation myocardial infarction (STEMI) of inferior wall (HCC) 12/18/12   STEMI of inf. wall w/PCI with Xperdition stent to VG to distal RCA  . Type II diabetes mellitus (HCC)      FAMILY HISTORY:   Family History  Problem Relation Age of Onset  . CAD Mother   . Heart disease Mother   . CAD Brother   . Heart attack Brother   . Hyperlipidemia Sister   . Leukemia Father   . CAD Maternal Grandmother   . Brain cancer Brother   . CAD Sister     SOCIAL HISTORY:   Social History   Tobacco Use  . Smoking status: Former Smoker    Packs/day: 4.50    Years: 30.00    Pack years: 135.00    Last attempt to quit: 05/07/1987    Years since quitting: 30.3  .  Smokeless tobacco: Former Neurosurgeon    Types: Chew  Substance Use Topics  . Alcohol use: No     ALLERGIES:   Allergies  Allergen Reactions  . Other Other (See Comments)    Pain medication that he was given after open heart surgery-sweating and hallucinations   . Aspirin Nausea And Vomiting    Can't take full strength uncoated aspirin  . Oxycodone Other (See Comments)    Hallucinations     CURRENT MEDICATIONS:   Current Outpatient Medications  Medication Sig Dispense Refill  . acetaminophen (TYLENOL) 500 MG tablet Take 500 mg by mouth every 6 (six) hours as needed for mild pain.    Marland Kitchen albuterol (PROVENTIL HFA;VENTOLIN HFA) 108 (90 Base) MCG/ACT inhaler Inhale 1 puff into the lungs every 6 (six) hours as needed for wheezing or shortness of breath.    Marland Kitchen apixaban (ELIQUIS) 5 MG TABS tablet Take 1 tablet (5 mg total) by mouth 2 (two) times daily. 180 tablet 3  . aspirin EC 81 MG EC tablet Take 1 tablet (81 mg total) by mouth daily.    Marland Kitchen atorvastatin (LIPITOR) 80 MG tablet TAKE 1 TABLET DAILY AT 6 PM. 30 tablet 5  . Bromfenac Sodium (PROLENSA) 0.07 % SOLN Apply 1 drop to eye as directed.    Marland Kitchen  dofetilide (TIKOSYN) 500 MCG capsule Take 1 capsule (500 mcg total) by mouth 2 (two) times daily. 14 capsule 0  . furosemide (LASIX) 20 MG tablet TAKE 1 TABLET EVERY DAY 90 tablet 3  . ketorolac (ACULAR) 0.4 % SOLN Place 1 drop into the left eye as directed.  1  . KLOR-CON M20 20 MEQ tablet TAKE 1.5 TABLETS (30 MEQ TOTAL) BY MOUTH DAILY. 45 tablet 4  . lisinopril (PRINIVIL,ZESTRIL) 5 MG tablet TAKE 1 TABLET (5 MG TOTAL) BY MOUTH DAILY. 30 tablet 11  . LORazepam (ATIVAN) 0.5 MG tablet Take 0.5 mg by mouth 2 (two) times daily.     . Magnesium 400 MG CAPS Take 400 mg by mouth 2 (two) times daily. 60 capsule 5  . metFORMIN (GLUCOPHAGE-XR) 750 MG 24 hr tablet Take 1 tablet by mouth daily.  1  . metoprolol tartrate (LOPRESSOR) 25 MG tablet Take 12.5 mg by mouth 2 (two) times daily.  1  . nitroGLYCERIN  (NITROSTAT) 0.4 MG SL tablet Place 1 tablet (0.4 mg total) under the tongue every 5 (five) minutes as needed for chest pain. 25 tablet 3  . ranitidine (ZANTAC) 150 MG tablet Take 150 mg by mouth 2 (two) times daily.     No current facility-administered medications for this visit.     REVIEW OF SYSTEMS:    denotes positive finding,  denotes negative finding Cardiac  Comments:  Chest pain or chest pressure:    Shortness of breath upon exertion:    Short of breath when lying flat:    Irregular heart rhythm:        Vascular    Pain in calf, thigh, or hip brought on by ambulation:    Pain in feet at night that wakes you up from your sleep:     Blood clot in your veins:    Leg swelling:         Pulmonary    Oxygen at home:    Productive cough:     Wheezing:         Neurologic    Sudden weakness in arms or legs:     Sudden numbness in arms or legs:     Sudden onset of difficulty speaking or slurred speech:    Temporary loss of vision in one eye:     Problems with dizziness:         Gastrointestinal    Blood in stool:     Vomited blood:         Genitourinary    Burning when urinating:     Blood in urine:        Psychiatric    Major depression:         Hematologic    Bleeding problems:    Problems with blood clotting too easily:        Skin    Rashes or ulcers:        Constitutional    Fever or chills:      PHYSICAL EXAM:   Vitals:   09/21/17 1145 09/21/17 1148  BP: 121/70 112/68  Pulse: 74   Resp: 17   Temp: 98.3 F (36.8 C)   TempSrc: Oral   SpO2: 95%   Weight: 294 lb 6.4 oz (133.5 kg)   Height:  (1.88 m)     GENERAL: The patient is a well-nourished male, in no acute distress. The vital signs are documented above. CARDIAC: There is a regular rate and rhythm.  PULMONARY: Non-labored  respirations ABDOMEN: Soft and non-tender with normal pitched bowel sounds.  MUSCULOSKELETAL: There are no major deformities or cyanosis. NEUROLOGIC: No focal  weakness or paresthesias are detected. SKIN: There are no ulcers or rashes noted. PSYCHIATRIC: The patient has a normal affect.  STUDIES:   I have ordered and reviewed his vascular lab studies Carotid: Right is 1-39%.  Left is 40-59%.  I have ordered and reviewed his CT angiogram with the following findings: 1. No significant interval change in the size or appearance of the 8.1 x 8.6 cm fusiform abdominal aortic aneurysm status post EVAR complicated by a type 2 endoleak. The degree of contrast enhancement in the native aneurysm sac is slightly less conspicuous on today's study but this may simply be due to slight differences in the timing of contrast enhancement. 2. 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  NON-VASCULAR  1. No acute abnormality within the abdomen or pelvis. 2. Ancillary findings as above without significant interval change.   MEDICAL ISSUES:   AAA: There has not been a significant change in the aneurysm sac.  A type II endoleak persists.  No intervention is recommended at this time.  The patient has previously seen Dr. Fredia Sorrow  Carotid: Patient remains asymptomatic.  Repeat Doppler study in 1 year.    Durene Cal, MD [facility] Specialists of [facility] [phone] Pager [phone]

## 2017-10-02 ENCOUNTER — Other Ambulatory Visit: Payer: Self-pay

## 2017-10-02 DIAGNOSIS — I714 Abdominal aortic aneurysm, without rupture, unspecified: Secondary | ICD-10-CM

## 2017-10-02 DIAGNOSIS — I6523 Occlusion and stenosis of bilateral carotid arteries: Secondary | ICD-10-CM

## 2017-10-26 ENCOUNTER — Other Ambulatory Visit (HOSPITAL_COMMUNITY): Payer: Self-pay | Admitting: Internal Medicine

## 2017-11-27 DIAGNOSIS — I129 Hypertensive chronic kidney disease with stage 1 through stage 4 chronic kidney disease, or unspecified chronic kidney disease: Secondary | ICD-10-CM | POA: Diagnosis not present

## 2017-11-27 DIAGNOSIS — E785 Hyperlipidemia, unspecified: Secondary | ICD-10-CM | POA: Diagnosis not present

## 2017-11-27 DIAGNOSIS — N182 Chronic kidney disease, stage 2 (mild): Secondary | ICD-10-CM | POA: Diagnosis not present

## 2017-11-27 DIAGNOSIS — E559 Vitamin D deficiency, unspecified: Secondary | ICD-10-CM | POA: Diagnosis not present

## 2017-11-27 DIAGNOSIS — E1121 Type 2 diabetes mellitus with diabetic nephropathy: Secondary | ICD-10-CM | POA: Diagnosis not present

## 2017-12-22 DIAGNOSIS — I2510 Atherosclerotic heart disease of native coronary artery without angina pectoris: Secondary | ICD-10-CM | POA: Diagnosis not present

## 2017-12-22 DIAGNOSIS — E785 Hyperlipidemia, unspecified: Secondary | ICD-10-CM | POA: Diagnosis not present

## 2017-12-22 DIAGNOSIS — I129 Hypertensive chronic kidney disease with stage 1 through stage 4 chronic kidney disease, or unspecified chronic kidney disease: Secondary | ICD-10-CM | POA: Diagnosis not present

## 2017-12-22 DIAGNOSIS — E559 Vitamin D deficiency, unspecified: Secondary | ICD-10-CM | POA: Diagnosis not present

## 2017-12-22 DIAGNOSIS — I10 Essential (primary) hypertension: Secondary | ICD-10-CM | POA: Diagnosis not present

## 2017-12-22 DIAGNOSIS — E1121 Type 2 diabetes mellitus with diabetic nephropathy: Secondary | ICD-10-CM | POA: Diagnosis not present

## 2018-01-04 ENCOUNTER — Other Ambulatory Visit (HOSPITAL_COMMUNITY): Payer: Self-pay | Admitting: *Deleted

## 2018-01-04 MED ORDER — DOFETILIDE 500 MCG PO CAPS: 500.0000 ug | ORAL_CAPSULE | Freq: Two times a day (BID) | ORAL | 3 refills | Status: DC

## 2018-02-11 ENCOUNTER — Other Ambulatory Visit: Payer: Self-pay | Admitting: Internal Medicine

## 2018-02-27 ENCOUNTER — Other Ambulatory Visit: Payer: Self-pay | Admitting: Internal Medicine

## 2018-03-19 ENCOUNTER — Other Ambulatory Visit: Payer: Self-pay | Admitting: Internal Medicine

## 2018-03-22 ENCOUNTER — Ambulatory Visit: Payer: Medicare Other | Admitting: Surgery

## 2018-03-22 ENCOUNTER — Ambulatory Visit
Admission: RE | Admit: 2018-03-22 | Discharge: 2018-03-22 | Disposition: A | Discharge status: Home / Self Care | Payer: Medicare Other | Source: Ambulatory Visit | Attending: Surgery | Admitting: Surgery

## 2018-03-22 DIAGNOSIS — I714 Abdominal aortic aneurysm, without rupture, unspecified: Secondary | ICD-10-CM

## 2018-03-22 DIAGNOSIS — I723 Aneurysm of iliac artery: Secondary | ICD-10-CM | POA: Diagnosis not present

## 2018-03-22 MED ORDER — IOPAMIDOL (ISOVUE-370) INJECTION 76%
75.0000 mL | Freq: Once | INTRAVENOUS | Status: AC | PRN
  Administered 2018-03-22: 75 mL via INTRAVENOUS

## 2018-03-22 MED ORDER — IOPAMIDOL (ISOVUE-300) INJECTION 61%: 75.0000 mL | Freq: Once | INTRAVENOUS | Status: DC | PRN

## 2018-03-29 ENCOUNTER — Other Ambulatory Visit: Payer: Self-pay

## 2018-03-29 ENCOUNTER — Ambulatory Visit (INDEPENDENT_AMBULATORY_CARE_PROVIDER_SITE_OTHER): Payer: Medicare Other | Admitting: Surgery

## 2018-03-29 ENCOUNTER — Encounter: Payer: Self-pay | Admitting: Surgery

## 2018-03-29 ENCOUNTER — Other Ambulatory Visit: Payer: Self-pay | Admitting: Internal Medicine

## 2018-03-29 VITALS — BP 125/76 | HR 71 | Temp 97.3°F | Resp 20 | Ht 74.0 in | Wt 295.0 lb

## 2018-03-29 DIAGNOSIS — I714 Abdominal aortic aneurysm, without rupture, unspecified: Secondary | ICD-10-CM

## 2018-03-29 MED ORDER — ATORVASTATIN CALCIUM 80 MG PO TABS: 80.0000 mg | ORAL_TABLET | Freq: Every day | ORAL | 2 refills | Status: DC

## 2018-03-29 NOTE — Telephone Encounter (Signed)
Received message that rx did not go through; called pharmacy to verify and they confirmed med was not received; created addendum to resend rx.

## 2018-03-29 NOTE — Progress Notes (Signed)
Vascular and Vein Specialist of [facility]  Patient name: Ryan Wise MRN: [id number] DOB: Oct 14, 1940 Sex: male   REASON FOR VISIT:    Follow up  HISOTRY OF PRESENT ILLNESS:   Ryan Wise a 77 y.o.malereturns today for follow-up. 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 His postoperative course was uncomplicated.   He has a history of CAD, s/p CABG.  He is followed by Dr. Rennis Golden.  He is on a statin for hypercholesterolemia.  He takes an ASA.  He is medically managed for hypertension.  He is a former smoker.  He remains very active.   PAST MEDICAL HISTORY:   Past Medical History:  Diagnosis Date  . Aneurysm of infrarenal abdominal aorta (HCC)    a. 01/2016 s/p endovascular repair of AAA (EVAR).  . Arthritis   . Chronic combined systolic and diastolic CHF (congestive heart failure) (HCC)    a. 01/2016 Echo: EF 35-40%, diff HK, mildly dil Ao root, mild MR, mildly dil LA.  Marland Kitchen Coronary artery disease    a. 1999 s/p CABG x4 (LIMA->LAD, VG->OM, VG->Diag, VG->RCA); b. 11/2012 Inferior STEMI/PCI: LIMA->LAD ok, VG->Diag 100, VG->OM 100, VG->RCA was culprit (PTCA/thrombectomy->Xience Xpedition DES), EF 25%.  Marland Kitchen GERD (gastroesophageal reflux disease)   . High triglycerides   . Hypertension   . Ischemic Cardiomyopathy    a. 01/2016 Echo: EF 35-40%, diff HK, inflat, inf AK.  . Obesity   . Persistent atrial fibrillation (HCC)    a. Dx 01/2016-->slow vent response on beta blocker;  b. CHA2DS2VASc = 6-->Eliquis;  c. 03/03/2016 unsuccessful DCCV.  . Ruptured lumbar disc   . ST elevation myocardial infarction (STEMI) of inferior wall (HCC) 12/18/12   STEMI of inf. wall w/PCI with Xperdition stent to VG to distal RCA  . Type II diabetes mellitus (HCC)      FAMILY HISTORY:   Family History    Problem Relation Age of Onset  . CAD Mother   . Heart disease Mother   . CAD Brother   . Heart attack Brother   . Hyperlipidemia Sister   . Leukemia Father   . CAD Maternal Grandmother   . Brain cancer Brother   . CAD Sister     SOCIAL HISTORY:   Social History   Tobacco Use  . Smoking status: Former Smoker    Packs/day: 4.50    Years: 30.00    Pack years: 135.00    Last attempt to quit: 05/07/1987    Years since quitting: 30.9  . Smokeless tobacco: Former Neurosurgeon    Types: Chew  Substance Use Topics  . Alcohol use: No     ALLERGIES:   Allergies  Allergen Reactions  . Other Other (See Comments)    Pain medication that he was given after open heart surgery-sweating and hallucinations   . Aspirin Nausea And Vomiting    Can't take full strength uncoated aspirin  . Oxycodone Other (See Comments)    Hallucinations     CURRENT MEDICATIONS:   Current Outpatient Medications  Medication Sig Dispense Refill  . acetaminophen (TYLENOL) 500 MG tablet Take 500 mg by mouth every 6 (six) hours as needed for mild pain.    Marland Kitchen albuterol (PROVENTIL HFA;VENTOLIN HFA) 108 (90 Base) MCG/ACT inhaler Inhale 1 puff into the lungs every 6 (six) hours as needed for wheezing or shortness of breath.    Marland Kitchen  apixaban (ELIQUIS) 5 MG TABS tablet Take 1 tablet (5 mg total) by mouth 2 (two) times daily. 180 tablet 3  . aspirin EC 81 MG EC tablet Take 1 tablet (81 mg total) by mouth daily.    Marland Kitchen atorvastatin (LIPITOR) 80 MG tablet TAKE 1 TABLET DAILY AT 6 PM. 30 tablet 5  . Bromfenac Sodium (PROLENSA) 0.07 % SOLN Apply 1 drop to eye as directed.    . dofetilide (TIKOSYN) 500 MCG capsule Take 1 capsule (500 mcg total) by mouth 2 (two) times daily. 180 capsule 3  . furosemide (LASIX) 20 MG tablet TAKE 1 TABLET BY MOUTH EVERY DAY 90 tablet 3  . isosorbide mononitrate (IMDUR) 30 MG 24 hr tablet TAKE 1 TABLET BY MOUTH EVERY DAY 30 tablet 11  . ketorolac (ACULAR) 0.4 % SOLN Place 1 drop into the left eye as  directed.  1  . KLOR-CON M20 20 MEQ tablet TAKE 1.5 TABLETS (30 MEQ TOTAL) BY MOUTH DAILY. 45 tablet 9  . lisinopril (PRINIVIL,ZESTRIL) 5 MG tablet TAKE 1 TABLET (5 MG TOTAL) BY MOUTH DAILY. 30 tablet 11  . LORazepam (ATIVAN) 0.5 MG tablet Take 0.5 mg by mouth 2 (two) times daily.     . Magnesium 400 MG CAPS Take 400 mg by mouth 2 (two) times daily. 60 capsule 5  . metFORMIN (GLUCOPHAGE-XR) 750 MG 24 hr tablet Take 1 tablet by mouth daily.  1  . metoprolol tartrate (LOPRESSOR) 25 MG tablet Take 12.5 mg by mouth 2 (two) times daily.  1  . nitroGLYCERIN (NITROSTAT) 0.4 MG SL tablet Place 1 tablet (0.4 mg total) under the tongue every 5 (five) minutes as needed for chest pain. 25 tablet 3  . ranitidine (ZANTAC) 150 MG tablet Take 150 mg by mouth 2 (two) times daily.     No current facility-administered medications for this visit.     REVIEW OF SYSTEMS:   [X]  denotes positive finding, [ ]  denotes negative finding Cardiac  Comments:  Chest pain or chest pressure:    Shortness of breath upon exertion:    Short of breath when lying flat:    Irregular heart rhythm:        Vascular    Pain in calf, thigh, or hip brought on by ambulation:    Pain in feet at night that wakes you up from your sleep:     Blood clot in your veins:    Leg swelling:         Pulmonary    Oxygen at home:    Productive cough:     Wheezing:         Neurologic    Sudden weakness in arms or legs:     Sudden numbness in arms or legs:     Sudden onset of difficulty speaking or slurred speech:    Temporary loss of vision in one eye:     Problems with dizziness:         Gastrointestinal    Blood in stool:     Vomited blood:         Genitourinary    Burning when urinating:     Blood in urine:        Psychiatric    Major depression:         Hematologic    Bleeding problems:    Problems with blood clotting too easily:        Skin    Rashes or ulcers:  Constitutional    Fever or chills:       PHYSICAL EXAM:   There were no vitals filed for this visit.  GENERAL: The patient is a well-nourished male, in no acute distress. The vital signs are documented above. CARDIAC: There is a regular rate and rhythm.  PULMONARY: Non-labored respirations ABDOMEN: Soft and non-tender  MUSCULOSKELETAL: There are no major deformities or cyanosis. NEUROLOGIC: No focal weakness or paresthesias are detected. SKIN: There are no ulcers or rashes noted. PSYCHIATRIC: The patient has a normal affect.  STUDIES:   I have reviewed his CTA with the following findings: VASCULAR  Aorto bi-iliac stent graft remains patent. The type 2 endoleak is again noted and aneurysm sac diameter has increased from 8.6 x 8.1 cm to 8.9 x 8.5 cm.  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  NON-VASCULAR  No acute findings.  MEDICAL ISSUES:   AAA: There has been an interval increase in the size of his aneurysm with persistent type II endoleak.  I will send him back to see Dr. Fredia Sorrow to consider embolization.  The patient would like to do everything to avoid being put to sleep again.  Carotid stenosis: The patient remains asymptomatic.  He will follow-up in 6 months with a carotid duplex.    Durene Cal, MD [facility] and [facility] Special[facility] of [facility] [phone] Pager [phone]

## 2018-03-29 NOTE — Telephone Encounter (Signed)
Rx(s) sent to pharmacy electronically.  

## 2018-03-29 NOTE — Addendum Note (Signed)
Addended by: Kandice Robinsons T on: 03/29/2018 03:14 PM   Modules accepted: Orders

## 2018-03-31 ENCOUNTER — Telehealth: Payer: Self-pay | Admitting: Internal Medicine

## 2018-03-31 NOTE — Telephone Encounter (Signed)
Faxed signed MD portion of Eliquis patient assistance application for 2020 to [employer] @ [phone] 

## 2018-04-09 ENCOUNTER — Other Ambulatory Visit: Payer: Self-pay | Admitting: Internal Medicine

## 2018-04-12 ENCOUNTER — Other Ambulatory Visit: Payer: Self-pay

## 2018-04-12 ENCOUNTER — Encounter (HOSPITAL_COMMUNITY): Payer: Self-pay

## 2018-04-12 MED ORDER — APIXABAN 5 MG PO TABS: 5.0000 mg | ORAL_TABLET | Freq: Two times a day (BID) | ORAL | 1 refills | Status: DC

## 2018-04-14 ENCOUNTER — Telehealth: Payer: Self-pay | Admitting: *Deleted

## 2018-04-14 ENCOUNTER — Ambulatory Visit
Admission: RE | Admit: 2018-04-14 | Discharge: 2018-04-14 | Disposition: A | Discharge status: Home / Self Care | Payer: Medicare Other | Source: Ambulatory Visit | Attending: Interventional Radiology | Admitting: Interventional Radiology

## 2018-04-14 DIAGNOSIS — T82330S Leakage of aortic (bifurcation) graft (replacement), sequela: Secondary | ICD-10-CM

## 2018-04-14 DIAGNOSIS — T82898D Other specified complication of vascular prosthetic devices, implants and grafts, subsequent encounter: Secondary | ICD-10-CM | POA: Diagnosis not present

## 2018-04-14 DIAGNOSIS — I714 Abdominal aortic aneurysm, without rupture: Secondary | ICD-10-CM | POA: Diagnosis not present

## 2018-04-14 DIAGNOSIS — IMO0001 Reserved for inherently not codable concepts without codable children: Secondary | ICD-10-CM

## 2018-04-14 HISTORY — PX: IR RADIOLOGIST EVAL & MGMT: IMG5224

## 2018-04-14 MED ORDER — DOFETILIDE 500 MCG PO CAPS: 500.0000 ug | ORAL_CAPSULE | Freq: Two times a day (BID) | ORAL | 3 refills | Status: DC

## 2018-04-14 NOTE — Telephone Encounter (Signed)
MD portion of eliquis patient assistance application has been faxed  Since Tikosyn patient assistance unavailable (see AFib clinic nurse note) - could change to generic?

## 2018-04-14 NOTE — Progress Notes (Signed)
Chief Complaint: Follow up of aortic endoleak post EVAR.  History of Present Illness: Ryan Wise is a 77 y.o. male originally referred by Dr. Trula Slade last October for evaluation of a type II endoleak with social increasing aortic aneurysm sac size.  It was elected at that time to follow the aneurysm sac for at least another 6 months given some stability of sac size.  A follow-up CTA was performed on 09/21/2017 demonstrating stable aneurysm sac size.  Another more recent CTA was performed on 03/22/2018 demonstrating some increase in aneurysm sac size.  Persistent endoleak has been demonstrated seen best on delayed images in the superior aspect of the aneurysm sac presumed to most likely be a type II endoleak.  The patient remains essentially asymptomatic with some mild left lateral abdominal discomfort periodically.  He saw Dr. Debara Pickett on 09/02/2017 and remains on Eliquis twice daily, aspirin and Tikosyn.  After the most recent follow-up CTA he saw Dr. Trula Slade on 03/29/2018 and is now referred back to rediscuss possible endoleak embolization given increased aneurysm sac size by CTA.  Past Medical History:  Diagnosis Date  . Aneurysm of infrarenal abdominal aorta ([place])    a. 01/2016 s/p endovascular repair of AAA (EVAR).  . Arthritis   . Chronic combined systolic and diastolic CHF (congestive heart failure) ([place])    a. 01/2016 Echo: EF 35-40%, diff HK, mildly dil Ao root, mild MR, mildly dil LA.  Marland Kitchen Coronary artery disease    a. 1999 s/p CABG x4 (LIMA->LAD, VG->OM, VG->Diag, VG->RCA); b. 11/2012 Inferior STEMI/PCI: LIMA->LAD ok, VG->Diag 100, VG->OM 100, VG->RCA was culprit (PTCA/thrombectomy->Xience Xpedition DES), EF 25%.  Marland Kitchen GERD (gastroesophageal reflux disease)   . High triglycerides   . Hypertension   . Ischemic Cardiomyopathy    a. 01/2016 Echo: EF 35-40%, diff HK, inflat, inf AK.  . Obesity   . Persistent atrial fibrillation    a. Dx 01/2016-->slow vent response on beta blocker;  b.  CHA2DS2VASc = 6-->Eliquis;  c. 03/03/2016 unsuccessful DCCV.  . Ruptured lumbar disc   . ST elevation myocardial infarction (STEMI) of inferior wall ([place]) 12/18/12   STEMI of inf. wall w/PCI with Xperdition stent to VG to distal RCA  . Type II diabetes mellitus ([place])     Past Surgical History:  Procedure Laterality Date  . BACK SURGERY     (5) back surgeries  . CARDIOVERSION N/A 03/03/2016   Procedure: CARDIOVERSION;  Surgeon: Pixie Casino, MD;  Location: [facility] ENDOSCOPY;  Service: Cardiovascular;  Laterality: N/A;  . CARDIOVERSION N/A 03/12/2016   Procedure: CARDIOVERSION;  Surgeon: Skeet Latch, MD;  Location: [place];  Service: Cardiovascular;  Laterality: N/A;  . CATARACT EXTRACTION, BILATERAL Right 07/10/2016   left eye 07/24/16  . CORONARY ARTERY BYPASS GRAFT  1999   (CAD) CABG was a 5-vessel bypass and had a questionable history of A fib. He underwent monitoring whick showed sinus bradycardia and PACs but no evidence of A fib.  . ENDOVASCULAR STENT INSERTION N/A 12/28/2015   Procedure: ENDOVASCULAR STENT GRAFT INSERTION;  Surgeon: Serafina Mitchell, MD;  Location: [place];  Service: Vascular;  Laterality: N/A;  . IR RADIOLOGIST EVAL & MGMT  03/24/2017  . IR RADIOLOGIST EVAL & MGMT  04/14/2018  . LEFT HEART CATHETERIZATION WITH CORONARY/GRAFT ANGIOGRAM  12/18/2012   Procedure: LEFT HEART CATHETERIZATION WITH Beatrix Fetters;  Surgeon: Troy Sine, MD;  Location: [facility] CATH LAB;  Service: Cardiovascular;;  . LUMBAR DISC SURGERY    . open heart surgery  03/1998  . TONSILLECTOMY      Allergies: Other; Aspirin; and Oxycodone  Medications: Prior to Admission medications   Medication Sig Start Date End Date Taking? Authorizing Provider  acetaminophen (TYLENOL) 500 MG tablet Take 500 mg by mouth every 6 (six) hours as needed for mild pain.   Yes [provider]  albuterol (PROVENTIL HFA;VENTOLIN HFA) 108 (90 Base) MCG/ACT inhaler Inhale 1 puff into the lungs every 6  (six) hours as needed for wheezing or shortness of breath.   Yes [provider]  apixaban (ELIQUIS) 5 MG TABS tablet Take 1 tablet (5 mg total) by mouth 2 (two) times daily. 04/12/18  Yes Pixie Casino, MD  aspirin EC 81 MG EC tablet Take 1 tablet (81 mg total) by mouth daily. 12/22/12  Yes Kilroy, Luke K, PA-C  atorvastatin (LIPITOR) 80 MG tablet Take 1 tablet (80 mg total) by mouth daily at 6 PM. 03/29/18  Yes Hilty, Nadean Corwin, MD  Bromfenac Sodium (PROLENSA) 0.07 % SOLN Apply 1 drop to eye as directed.   Yes [provider]  dofetilide (TIKOSYN) 500 MCG capsule Take 1 capsule (500 mcg total) by mouth 2 (two) times daily. 01/04/18  Yes Sherran Needs, NP  furosemide (LASIX) 20 MG tablet TAKE 1 TABLET BY MOUTH EVERY DAY 02/11/18  Yes Hilty, Nadean Corwin, MD  isosorbide mononitrate (IMDUR) 30 MG 24 hr tablet TAKE 1 TABLET BY MOUTH EVERY DAY 03/01/18  Yes Hilty, Nadean Corwin, MD  ketorolac (ACULAR) 0.4 % SOLN Place 1 drop into the left eye as directed. 07/09/16  Yes [provider]  KLOR-CON M20 20 MEQ tablet TAKE 1.5 TABLETS (30 MEQ TOTAL) BY MOUTH DAILY. 10/27/17  Yes Hilty, Nadean Corwin, MD  lisinopril (PRINIVIL,ZESTRIL) 5 MG tablet TAKE 1 TABLET (5 MG TOTAL) BY MOUTH DAILY. 03/01/18  Yes Hilty, Nadean Corwin, MD  LORazepam (ATIVAN) 0.5 MG tablet Take 0.5 mg by mouth 2 (two) times daily.  01/07/13  Yes Hilty, Nadean Corwin, MD  Magnesium 400 MG CAPS Take 400 mg by mouth 2 (two) times daily. 07/31/16  Yes Hilty, Nadean Corwin, MD  metFORMIN (GLUCOPHAGE-XR) 750 MG 24 hr tablet Take 1 tablet by mouth daily. 11/23/15  Yes [provider]  metoprolol tartrate (LOPRESSOR) 25 MG tablet Take 12.5 mg by mouth 2 (two) times daily. 07/01/16  Yes [provider]  nitroGLYCERIN (NITROSTAT) 0.4 MG SL tablet Place 1 tablet (0.4 mg total) under the tongue every 5 (five) minutes as needed for chest pain. 12/22/12  Yes Kilroy, Luke K, PA-C  ranitidine (ZANTAC) 150 MG tablet Take 150 mg by mouth 2 (two)  times daily.   Yes [provider]     Family History  Problem Relation Age of Onset  . CAD Mother   . Heart disease Mother   . CAD Brother   . Heart attack Brother   . Hyperlipidemia Sister   . Leukemia Father   . CAD Maternal Grandmother   . Brain cancer Brother   . CAD Sister     Social History   Socioeconomic History  . Marital status: Divorced    Spouse name: Not on file  . Number of children: 1  . Years of education: Not on file  . Highest education level: Not on file  Occupational History  . 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 Financial resource strain: Not on file  . Food insecurity:    Worry: Not on  file    Inability: Not on file  . Transportation needs:    Medical: Not on file    Non-medical: Not on file  Tobacco Use  . Smoking status: Former Smoker    Packs/day: 4.50    Years: 30.00    Pack years: 135.00    Last attempt to quit: 05/07/1987    Years since quitting: 30.9  . Smokeless tobacco: Former Systems developer    Types: Chew  Substance and Sexual Activity  . Alcohol use: No  . Drug use: No  . Sexual activity: Not on file  Lifestyle  . Physical activity:    Days per week: Not on file    Minutes per session: Not on file  . Stress: Not on file  Relationships  . Social connections:    Talks on phone: Not on file    Gets together: Not on file    Attends religious service: Not on file    Active member of club or organization: Not on file    Attends meetings of clubs or organizations: Not on file    Relationship status: Not on file  Other Topics Concern  . Not on file  Social History Narrative  . Not on file    Review of Systems: A 12 point ROS discussed and pertinent positives are indicated in the HPI above.  All other systems are negative.  Review of Systems  Constitutional: Negative.   Respiratory: Negative.   Cardiovascular: Negative.   Gastrointestinal: Negative.        Mild occasional left lateral  abdominal pain.  Genitourinary: Negative.   Musculoskeletal: Negative.     Vital Signs: BP 119/63 (BP Location: Right Arm, Patient Position: Sitting, Cuff Size: Normal)   Pulse 81   Temp 97.6 F (36.4 C)   Resp 18   Ht 6' (1.829 m)   Wt 131.5 kg   SpO2 94%   BMI 39.33 kg/m   Physical Exam  Constitutional: He is oriented to person, place, and time. He appears well-developed and well-nourished. No distress.  Abdominal: Soft. He exhibits no distension and no mass. There is no tenderness. There is no rebound and no guarding.  Musculoskeletal: He exhibits no edema.  Neurological: He is alert and oriented to person, place, and time.  Skin: He is not diaphoretic.  Vitals reviewed.   Imaging: Ir Radiologist Eval & Mgmt  Result Date: 04/14/2018 Please refer to notes tab for details about interventional procedure. (Op Note)  Ct Angio Abdomen Pelvis  W &/or Wo Contrast  Result Date: 03/22/2018 CLINICAL DATA:  Aorto bi-iliac stent graft EXAM: CTA ABDOMEN AND PELVIS wITHOUT AND WITH CONTRAST TECHNIQUE: Multidetector CT imaging of the abdomen and pelvis was performed using the standard protocol during bolus administration of intravenous contrast. Multiplanar reconstructed images and MIPs were obtained and reviewed to evaluate the vascular anatomy. CONTRAST:  66m ISOVUE-370 IOPAMIDOL (ISOVUE-370) INJECTION 76% COMPARISON:  09/21/2017 FINDINGS: VASCULAR Aorta: Aorto bi-iliac stent graft is patent and stable in position. Maximal AP and transverse diameters of the aneurysm sac are 8.9 and 8.5 cm compared with 8.6 and 8.1 cm on the prior study. This is consistent with slight interval growth. The type 2 endoleak towards the superior and of the aneurysm sac is again noted and best visualized on delayed phase imaging. It is likely related to an upper left lumbar artery or the IMA. Celiac: Patent. SMA: Patent Renals: 2 right renal arteries and a single left renal artery are patent. IMA: Origin is  occluded.  Branches reconstitute. Inflow: Distal right common iliac artery landing zone is patent. Right internal and external iliac arteries are patent. Distal left common iliac artery landing zone is patent. Maximal diameter of the native left common iliac artery is 2.7 cm. This is stable. Left external iliac artery is patent. 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 It is also patent. Proximal Outflow: No significant narrowing visualized. Veins: No evidence of DVT. Review of the MIP images confirms the above findings. NON-VASCULAR Lower chest: Stable nodule in the lingula supporting benign etiology. Hepatobiliary: Calcified granulomata. Liver and gallbladder are otherwise within normal limits. Pancreas: Unremarkable Spleen: Unremarkable Adrenals/Urinary Tract: Adrenal glands are within normal limits. Kidneys are within normal limits. Bladder is within normal limits. Stomach/Bowel: Sigmoid diverticulosis. No obvious mass in the colon. Normal appendix. No evidence of small-bowel obstruction. Lymphatic: No abnormal retroperitoneal adenopathy. Reproductive: Prostate is within normal limits. Other: No free-fluid. Musculoskeletal: No vertebral compression deformity. IMPRESSION: VASCULAR Aorto bi-iliac stent graft remains patent. The type 2 endoleak is again noted and aneurysm sac diameter has increased from 8.6 x 8.1 cm to 8.9 x 8.5 cm. 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 NON-VASCULAR No acute findings. Electronically Signed   By: Marybelle Killings M.D.   On: 03/22/2018 11:01     Assessment and Plan:  I independently measured the aneurysm sac at the exact same level on the last 3 CTA scans as follows:  03/09/17: 8.1 x 8.7 cm 09/21/17: 8.2 x 8.7 cm 03/12/18: 8.4 x 8.8 cm  I met with Mr. Limbach and his daughter Sharyn Lull. We reviewed imaging which shows very slow enlargement of the aortic aneurysm sac over the last year and a  persistent endoleak in the superior sac.  This remains most likely a type II endoleak.  However, definitive supplied by either the IMA or lumbar arteries is not definable by CTA.  Given degree of opacification of the IMA trunk nearly up to the level of the aneurysm sac, I feel that the IMA may be a more likely source than lumbar arteries.  There may be more than one vessel contributing to the endoleak.  We again discussed percutaneous embolization options.  The patient and his daughter are very concerned about risks of general anesthesia given prior experiences and therefore would prefer a procedure performed under conscious sedation.  Detailed arteriographic interrogation of the superior mesenteric artery, endograft and bilateral internal iliac arteries may or may not uncover the source of endoleak and allow the endoleak to be treated by transcatheter means.  I did share with the patient and his daughter a recent case where a prominent type II endoleak spontaneously resolved on its own and remains resolved after stopping chronic anticoagulation with Xarelto.  I told him that I will check with Dr. Debara Pickett regarding whether he felt it was safe to stop Eliquis for at least 2-3 months, and possibly long-term, to see if that will make a difference in perfusion of the aortic aneurysm sac in follow-up. That could potentially avoid the need for angiography and embolization.  If Dr. Debara Pickett feels that Mr. Greenleaf needs to be on chronic Eliquis, I recommended we proceed with arteriography soon at [facility] with possible embolization of an endoleak should it be uncovered and able to be catheterized. This would require being off Eliquis for 2-3 days prior to the procedure. The procedure would be an outpatient procedure with conscious sedation.  I will get  back in touch with Mr. Roussel once I hear back from Dr. Debara Pickett regarding his opinion about whether Eliquis can be safely held.   Electronically SignedAletta Edouard  T 04/14/2018, 12:50 PM     I spent a total of 15 Minutes in face to face in clinical consultation, greater than 50% of which was counseling/coordinating care for an aortic endoleak.

## 2018-04-14 NOTE — Telephone Encounter (Signed)
Rx printed/signed so that patient/family can pick up for submission to Rx Outreach

## 2018-04-14 NOTE — Telephone Encounter (Signed)
Talked with patient's daughter - Dofetilide is still very expensive for patient - through RX outreach will be $124 for a 3 month supply. The daughter is unsure if he would be able to afford even that copay. I did provide the patient with 1 month of tikosyn today so we have a few weeks to make the best decision for patient. Patient wanted to know what would happen if he stopped tikosyn altogether - educated without tikosyn he may return to AF. Pt wanted to know Dr. Roberts GaudyHilty/Donna Carroll NP opinion on what patient should do going forward if he cannot afford Dofetilide. I will be back in touch with daughter once recommendations from Dr. Rennis GoldenHilty are known - with the possibility of coming off Eliquis for several months due to endoleak per Dr. Fredia SorrowYamagata note today - maybe best to ensure NSR through that process. Lupita LeashDonna stated Amiodarone maybe best option with affordability but she would prefer this recommendation come from Dr. Rennis GoldenHilty since patient is well known to him.

## 2018-04-14 NOTE — Telephone Encounter (Signed)
Patient and daughter Ryan Wise came by office.   Ryan Wise has several  Question for Ryan Wise. 1-  ( heads up)  Ryan Fredia SorrowYamagata will touching base with Ryan Wise --  AAA is increasing in size .Ryan Fredia SorrowYamagata wanted to know if  If  Eliquis could be stopped or not. He   wanted to know if the blood thinner is feeding the AAA- depending on answer  - which way Ryan Fredia SorrowYamagata will proceed.  2- the price of ELIQUIS  AND TIKOSYN for patient is over $1000 month without assistance. Patient is no receiving patient assistance through  Drug company.  For next year per daughter received a letter stating , [employer] Parish[employer] will no longer have patient assistance. Daughter spoke to RN at afib clinic- she was informed  To contact Ryan Wise  if patient still needs medication or if there is another option or medication that may help what are the options  Daughter will bring patient assistance form [facility] with attached financial info to sign and faxed In the next few days- IF patient is to continue with medication for [facility] to fax.  Daughter aware will defer to Ryan HILTY and wil be contacted.

## 2018-04-14 NOTE — Telephone Encounter (Signed)
Would not recommend [name] stop Eliquis - he is at high risk of stroke due to afib. He should also remain on dofetilide. Would recommend trying to get generic Rx patient assistance. I would support Dr. Antonietta JewelYamagata's efforts to try and find the source of bleeding and fix it. Would be ok to hold Eliquis for 2-3 days prior to the procedure and restart after if necessary.  I discussed this in detail with his daughter today and she is in agreement.  Dr. HRexene Edison

## 2018-04-15 ENCOUNTER — Other Ambulatory Visit (HOSPITAL_COMMUNITY): Payer: Self-pay | Admitting: Interventional Radiology

## 2018-04-15 DIAGNOSIS — I714 Abdominal aortic aneurysm, without rupture, unspecified: Secondary | ICD-10-CM

## 2018-04-19 NOTE — Telephone Encounter (Signed)
Follow up: ° ° ° °Patient returning call back. Please call back °

## 2018-04-19 NOTE — Telephone Encounter (Signed)
LM for daughter to call back.

## 2018-04-19 NOTE — Telephone Encounter (Signed)
Spoke with patient's daughter and reviewed medications.   Patient has not been taking metoprolol tartrate - Rx'ed by Dr. Ronne BinningMcKenzie per daughter. He has been off for all of 2019. Explained that VSS at April visit. Will notify MD. She will contact prescribing provider's office.  She will also bring by eliquis patient assistance application for 2020 for MD to sign/submit

## 2018-04-21 ENCOUNTER — Telehealth: Payer: Self-pay | Admitting: Internal Medicine

## 2018-04-21 NOTE — Telephone Encounter (Signed)
Faxed eliquis patient assistance application to [employer] @ [phone] 

## 2018-04-29 ENCOUNTER — Other Ambulatory Visit: Payer: Self-pay | Admitting: Radiology

## 2018-04-29 ENCOUNTER — Other Ambulatory Visit: Payer: Self-pay | Admitting: Physician Assistant

## 2018-04-30 ENCOUNTER — Encounter (HOSPITAL_COMMUNITY): Payer: Self-pay

## 2018-04-30 ENCOUNTER — Other Ambulatory Visit: Payer: Self-pay

## 2018-04-30 ENCOUNTER — Ambulatory Visit (HOSPITAL_COMMUNITY)
Admission: RE | Admit: 2018-04-30 | Discharge: 2018-04-30 | Disposition: A | Discharge status: Home / Self Care | Payer: Medicare Other | Source: Ambulatory Visit | Attending: Interventional Radiology | Admitting: Interventional Radiology

## 2018-04-30 ENCOUNTER — Other Ambulatory Visit (HOSPITAL_COMMUNITY): Payer: Self-pay | Admitting: Interventional Radiology

## 2018-04-30 VITALS — BP 131/78 | HR 81 | Temp 98.0°F | Resp 16 | Ht 74.0 in | Wt 280.0 lb

## 2018-04-30 DIAGNOSIS — I5042 Chronic combined systolic (congestive) and diastolic (congestive) heart failure: Secondary | ICD-10-CM | POA: Insufficient documentation

## 2018-04-30 DIAGNOSIS — Z955 Presence of coronary angioplasty implant and graft: Secondary | ICD-10-CM | POA: Diagnosis not present

## 2018-04-30 DIAGNOSIS — Z79899 Other long term (current) drug therapy: Secondary | ICD-10-CM | POA: Insufficient documentation

## 2018-04-30 DIAGNOSIS — I255 Ischemic cardiomyopathy: Secondary | ICD-10-CM | POA: Diagnosis not present

## 2018-04-30 DIAGNOSIS — T82330A Leakage of aortic (bifurcation) graft (replacement), initial encounter: Secondary | ICD-10-CM | POA: Diagnosis not present

## 2018-04-30 DIAGNOSIS — Z7901 Long term (current) use of anticoagulants: Secondary | ICD-10-CM | POA: Insufficient documentation

## 2018-04-30 DIAGNOSIS — Z7984 Long term (current) use of oral hypoglycemic drugs: Secondary | ICD-10-CM | POA: Insufficient documentation

## 2018-04-30 DIAGNOSIS — Z87891 Personal history of nicotine dependence: Secondary | ICD-10-CM | POA: Insufficient documentation

## 2018-04-30 DIAGNOSIS — Z8679 Personal history of other diseases of the circulatory system: Secondary | ICD-10-CM | POA: Diagnosis not present

## 2018-04-30 DIAGNOSIS — Z7982 Long term (current) use of aspirin: Secondary | ICD-10-CM | POA: Insufficient documentation

## 2018-04-30 DIAGNOSIS — E669 Obesity, unspecified: Secondary | ICD-10-CM | POA: Insufficient documentation

## 2018-04-30 DIAGNOSIS — IMO0002 Reserved for concepts with insufficient information to code with codable children: Secondary | ICD-10-CM

## 2018-04-30 DIAGNOSIS — M1990 Unspecified osteoarthritis, unspecified site: Secondary | ICD-10-CM | POA: Diagnosis not present

## 2018-04-30 DIAGNOSIS — Z8249 Family history of ischemic heart disease and other diseases of the circulatory system: Secondary | ICD-10-CM | POA: Diagnosis not present

## 2018-04-30 DIAGNOSIS — I714 Abdominal aortic aneurysm, without rupture, unspecified: Secondary | ICD-10-CM

## 2018-04-30 DIAGNOSIS — I4819 Other persistent atrial fibrillation: Secondary | ICD-10-CM | POA: Insufficient documentation

## 2018-04-30 DIAGNOSIS — Z951 Presence of aortocoronary bypass graft: Secondary | ICD-10-CM | POA: Diagnosis not present

## 2018-04-30 DIAGNOSIS — I252 Old myocardial infarction: Secondary | ICD-10-CM | POA: Diagnosis not present

## 2018-04-30 DIAGNOSIS — E119 Type 2 diabetes mellitus without complications: Secondary | ICD-10-CM | POA: Insufficient documentation

## 2018-04-30 DIAGNOSIS — Z885 Allergy status to narcotic agent status: Secondary | ICD-10-CM | POA: Diagnosis not present

## 2018-04-30 DIAGNOSIS — Z886 Allergy status to analgesic agent status: Secondary | ICD-10-CM | POA: Insufficient documentation

## 2018-04-30 DIAGNOSIS — Z6835 Body mass index (BMI) 35.0-35.9, adult: Secondary | ICD-10-CM | POA: Diagnosis not present

## 2018-04-30 DIAGNOSIS — I2510 Atherosclerotic heart disease of native coronary artery without angina pectoris: Secondary | ICD-10-CM | POA: Insufficient documentation

## 2018-04-30 DIAGNOSIS — I110 Hypertensive heart disease with heart failure: Secondary | ICD-10-CM | POA: Diagnosis not present

## 2018-04-30 DIAGNOSIS — K219 Gastro-esophageal reflux disease without esophagitis: Secondary | ICD-10-CM | POA: Diagnosis not present

## 2018-04-30 HISTORY — PX: IR ANGIOGRAM SELECTIVE EACH ADDITIONAL VESSEL: IMG667

## 2018-04-30 HISTORY — PX: IR ANGIOGRAM PELVIS SELECTIVE OR SUPRASELECTIVE: IMG661

## 2018-04-30 HISTORY — PX: IR EMBO ARTERIAL NOT HEMORR HEMANG INC GUIDE ROADMAPPING: IMG5448

## 2018-04-30 HISTORY — PX: IR US GUIDE VASC ACCESS RIGHT: IMG2390

## 2018-04-30 HISTORY — PX: IR ANGIOGRAM VISCERAL SELECTIVE: IMG657

## 2018-04-30 LAB — BASIC METABOLIC PANEL
ANION GAP: 10 (ref 5–15)
BUN: 19 mg/dL (ref 8–23)
CALCIUM: 8.6 mg/dL — AB (ref 8.9–10.3)
CO2: 27 mmol/L (ref 22–32)
Chloride: 104 mmol/L (ref 98–111)
Creatinine, Ser: 1.13 mg/dL (ref 0.61–1.24)
GFR calc Af Amer: >60 mL/min (ref >60)
GFR calc non Af Amer: >60 mL/min (ref >60)
Glucose, Bld: 132 mg/dL — ABNORMAL HIGH (ref 70–99)
Potassium: 4.3 mmol/L (ref 3.5–5.1)
Sodium: 141 mmol/L (ref 135–145)

## 2018-04-30 LAB — CBC
HCT: 41.1 % (ref 39.0–52.0)
Hemoglobin: 12.7 g/dL — ABNORMAL LOW (ref 13.0–17.0)
MCH: 28.3 pg (ref 26.0–34.0)
MCHC: 30.9 g/dL (ref 30.0–36.0)
MCV: 91.5 fL (ref 80.0–100.0)
Platelets: 202 10*3/uL (ref 150–400)
RBC: 4.49 MIL/uL (ref 4.22–5.81)
RDW: 14.4 % (ref 11.5–15.5)
WBC: 7.9 10*3/uL (ref 4.0–10.5)
nRBC: 0 % (ref 0.0–0.2)

## 2018-04-30 LAB — PROTIME-INR
INR: 1.09
Prothrombin Time: 14 seconds (ref 11.4–15.2)

## 2018-04-30 LAB — GLUCOSE, CAPILLARY: Glucose-Capillary: 132 mg/dL — ABNORMAL HIGH (ref 70–99)

## 2018-04-30 MED ORDER — IOHEXOL 300 MG/ML  SOLN
450.0000 mL | Freq: Once | INTRAMUSCULAR | Status: AC | PRN
  Administered 2018-04-30: 200 mL via INTRA_ARTERIAL

## 2018-04-30 MED ORDER — SODIUM CHLORIDE 0.9 % IV SOLN
INTRAVENOUS | Status: AC | PRN
  Administered 2018-04-30: 75 mL/h via INTRAVENOUS

## 2018-04-30 MED ORDER — MIDAZOLAM HCL 2 MG/2ML IJ SOLN
INTRAMUSCULAR | Status: AC
  Filled 2018-04-30: qty 8

## 2018-04-30 MED ORDER — SODIUM CHLORIDE 0.9 % IV SOLN: INTRAVENOUS | Status: DC

## 2018-04-30 MED ORDER — SODIUM CHLORIDE 0.9 % IV SOLN
INTRAVENOUS | Status: DC
  Administered 2018-04-30: 08:00:00 via INTRAVENOUS

## 2018-04-30 MED ORDER — HEPARIN SODIUM (PORCINE) 1000 UNIT/ML IJ SOLN
INTRAMUSCULAR | Status: AC
  Filled 2018-04-30: qty 1

## 2018-04-30 MED ORDER — FENTANYL CITRATE (PF) 100 MCG/2ML IJ SOLN
INTRAMUSCULAR | Status: AC | PRN
  Administered 2018-04-30: 25 ug via INTRAVENOUS
  Administered 2018-04-30 (×2): 50 ug via INTRAVENOUS
  Administered 2018-04-30 (×3): 25 ug via INTRAVENOUS

## 2018-04-30 MED ORDER — MIDAZOLAM HCL 2 MG/2ML IJ SOLN
INTRAMUSCULAR | Status: AC | PRN
  Administered 2018-04-30 (×2): 0.5 mg via INTRAVENOUS
  Administered 2018-04-30: 1 mg via INTRAVENOUS
  Administered 2018-04-30 (×2): 0.5 mg via INTRAVENOUS

## 2018-04-30 MED ORDER — FENTANYL CITRATE (PF) 100 MCG/2ML IJ SOLN
INTRAMUSCULAR | Status: AC
  Filled 2018-04-30: qty 8

## 2018-04-30 MED ORDER — LIDOCAINE HCL 1 % IJ SOLN
INTRAMUSCULAR | Status: AC
  Filled 2018-04-30: qty 20

## 2018-04-30 NOTE — Progress Notes (Signed)
No bleeding or hematoma noted after ambulation 

## 2018-04-30 NOTE — Progress Notes (Addendum)
Patient ID: Kerby MoorsVernon Mangham, male   DOB: 02/04/1941, 77 y.o.   MRN: [id number] Patient currently without complaints, specifically no abdominal/back pain, nausea, vomiting or bleeding.   VSS;AF Abdomen soft, nontender.  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  Status post successful embolization of type II endoleak via transcatheter approach by catheterization of SMA collateral supply to the IMA and retrograde catheterization of the IMA trunk into the aortic aneurysm sac.  Irregular cavity perfusion within the aneurysm sac was treated with embolization coils.  No evidence of type I or type III endoleak by angiography.  Discharge later this afternoon if ambulating ok with follow-up CT angiogram abdomen pelvis and clinic visit with Dr. Fredia SorrowYamagata in 2 months.  Jeananne RamaKevin Allred, [facility]

## 2018-04-30 NOTE — Discharge Instructions (Signed)
Hold Metformin for 48 hours  Cerebral Angiogram, Care After Refer to this sheet in the next few weeks. These instructions provide you with information on caring for yourself after your procedure. Your health care provider may also give you more specific instructions. Your treatment has been planned according to current medical practices, but problems sometimes occur. Call your health care provider if you have any problems or questions after your procedure. What can I expect after the procedure? After your procedure, it is typical to have the following:  Bruising at the catheter insertion site that usually fades within 1-2 weeks.  Blood collecting in the tissue (hematoma) that may be painful to the touch. It should usually decrease in size and tenderness within 1-2 weeks.  A mild headache.  Follow these instructions at home:  Take medicines only as directed by your health care provider.  You may shower 24-48 hours after the procedure or as directed by your health care provider. Remove the bandage (dressing) and gently wash the site with plain soap and water. Pat the area dry with a clean towel. Do not rub the site, because this may cause bleeding.  Do not take baths, swim, or use a hot tub until your health care provider approves.  Check your insertion site every day for redness, swelling, or drainage.  Do not apply powder or lotion to the site.  Do not lift over 10 lb (4.5 kg) for 5 days after your procedure or as directed by your health care provider.  Ask your health care provider when it is okay to: ? Return to work or school. ? Resume usual physical activities or sports. ? Resume sexual activity.  Do not drive home if you are discharged the same day as the procedure. Have someone else drive you.  You may drive 24 hours after the procedure unless otherwise instructed by your health care provider.  Do not operate machinery or power tools for 24 hours after the procedure or as  directed by your health care provider.  If your procedure was done as an outpatient procedure, which means that you went home the same day as your procedure, a responsible adult should be with you for the first 24 hours after you arrive home.  Keep all follow-up visits as directed by your health care provider. This is important. Contact a health care provider if:  You have a fever.  You have chills.  You have increased bleeding from the catheter insertion site. Hold pressure on the site. Get help right away if:  You have vision changes or loss of vision.  You have numbness or weakness on one side of your body.  You have difficulty talking, or you have slurred speech or cannot speak (aphasia).  You feel confused or have difficulty remembering.  You have unusual pain at the catheter insertion site.  You have redness, warmth, or swelling at the catheter insertion site.  You have drainage (other than a small amount of blood on the dressing) from the catheter insertion site.  The catheter insertion site is bleeding, and the bleeding does not stop after 30 minutes of holding steady pressure on the site. These symptoms may represent a serious problem that is an emergency. Do not wait to see if the symptoms will go away. Get medical help right away. Call your local emergency services (911 in [place] Do not drive yourself to the hospital. This information is not intended to replace advice given to you by your health care provider.  Make sure you discuss any questions you have with your health care provider. Document Released: 09/26/2013 Document Revised: 10/18/2015 Document Reviewed: 05/25/2013 Elsevier Interactive Patient Education  2017 Elsevier Inc.  Moderate Conscious Sedation, Adult, Care After These instructions provide you with information about caring for yourself after your procedure. Your health care provider may also give you more specific instructions. Your treatment has been  planned according to current medical practices, but problems sometimes occur. Call your health care provider if you have any problems or questions after your procedure. What can I expect after the procedure? After your procedure, it is common:  To feel sleepy for several hours.  To feel clumsy and have poor balance for several hours.  To have poor judgment for several hours.  To vomit if you eat too soon.  Follow these instructions at home: For at least 24 hours after the procedure:   Do not: ? Participate in activities where you could fall or become injured. ? Drive. ? Use heavy machinery. ? Drink alcohol. ? Take sleeping pills or medicines that cause drowsiness. ? Make important decisions or sign legal documents. ? Take care of children on your own.  Rest. Eating and drinking  Follow the diet recommended by your health care provider.  If you vomit: ? Drink water, juice, or soup when you can drink without vomiting. ? Make sure you have little or no nausea before eating solid foods. General instructions  Have a responsible adult stay with you until you are awake and alert.  Take over-the-counter and prescription medicines only as told by your health care provider.  If you smoke, do not smoke without supervision.  Keep all follow-up visits as told by your health care provider. This is important. Contact a health care provider if:  You keep feeling nauseous or you keep vomiting.  You feel light-headed.  You develop a rash.  You have a fever. Get help right away if:  You have trouble breathing. This information is not intended to replace advice given to you by your health care provider. Make sure you discuss any questions you have with your health care provider. Document Released: 03/02/2013 Document Revised: 10/15/2015 Document Reviewed: 09/01/2015 Elsevier Interactive Patient Education  [employer].

## 2018-04-30 NOTE — Procedures (Signed)
Interventional Radiology Procedure Note  Procedure: Aortogram, pelvic arteriography, SMA arteriography, retrograde IMA catheterization, embolization of aortic endoleak  Complications: None  Estimated Blood Loss: < 10 mL  Access: Right CFA, 5 Fr sheath  Findings: No evidence of type I endoleak. No convincing lumbar supply for type II endoleak. Right internal iliac arteriography shows retrograde reconstitution of lumbar arteries but no convincing endoleak. Prominent SMA collateral reconstitution of IMA trunk. Able to catheterize IMA trunk and aortic sac and embolize aortic sac with multiple coils. Coils extended back into IMA trunk.  Jodi MarbleGlenn T. Fredia SorrowYamagata, M.D Pager:  [phone]

## 2018-04-30 NOTE — H&P (Signed)
Chief Complaint: Patient was seen in consultation today for type II endoleak repair  Supervising Physician: Irish LackYamagata, Glenn  [facility]  History of Present Illness: Ryan Wise is a 77 y.o. male with a past medical history significant for CHF, GERD, HTN, ischemic cardiomyopathy, a.fib on Eliquis, STEMI s/p stenting (2014), DM II and AAA s/p endovascular repair (2017) who presents today for repair of type II endoleak in IR. Patient previously referred to IR clinic by Dr. Myra GianottiBrabham to discuss endovascular treatment options for type II endoleak along the left aspect of the aneurysm sac - he was originally seen in consultation with Dr. Fredia SorrowYamagata on 03/24/17 where it was decided to continue to monitor the aneurysm sac. He was again seen in clinic by Dr. Fredia SorrowYamagata on 04/14/18 due to enlargement of aneurysm sac and persistent endoleak seen on most recent CTA - it was decided at that time to pursue percutaneous embolization for which he presents today.  Patient states he has been doing well overall - he does complain of chronic left lateral abdominal pain that waxes and wanes and does not appear affected by food or bowel movements. He also reports some dyspnea with exertion which he notices is more prominent when he is having abdominal pain. He denies any other complaints today.  Past Medical History:  Diagnosis Date  . Aneurysm of infrarenal abdominal aorta (HCC)    a. 01/2016 s/p endovascular repair of AAA (EVAR).  . Arthritis   . Chronic combined systolic and diastolic CHF (congestive heart failure) (HCC)    a. 01/2016 Echo: EF 35-40%, diff HK, mildly dil Ao root, mild MR, mildly dil LA.  Marland Kitchen. Coronary artery disease    a. 1999 s/p CABG x4 (LIMA->LAD, VG->OM, VG->Diag, VG->RCA); b. 11/2012 Inferior STEMI/PCI: LIMA->LAD ok, VG->Diag 100, VG->OM 100, VG->RCA was culprit (PTCA/thrombectomy->Xience Xpedition DES), EF 25%.  Marland Kitchen. GERD (gastroesophageal reflux disease)   . High triglycerides     . Hypertension   . Ischemic Cardiomyopathy    a. 01/2016 Echo: EF 35-40%, diff HK, inflat, inf AK.  . Obesity   . Persistent atrial fibrillation    a. Dx 01/2016-->slow vent response on beta blocker;  b. CHA2DS2VASc = 6-->Eliquis;  c. 03/03/2016 unsuccessful DCCV.  . Ruptured lumbar disc   . ST elevation myocardial infarction (STEMI) of inferior wall (HCC) 12/18/12   STEMI of inf. wall w/PCI with Xperdition stent to VG to distal RCA  . Type II diabetes mellitus (HCC)     Past Surgical History:  Procedure Laterality Date  . BACK SURGERY     (5) back surgeries  . CARDIOVERSION N/A 03/03/2016   Procedure: CARDIOVERSION;  Surgeon: Chrystie NoseKenneth C Hilty, MD;  [facility];  Service: Cardiovascular;  Laterality: N/A;  . CARDIOVERSION N/A 03/12/2016   Procedure: CARDIOVERSION;  Surgeon: Chilton Siiffany [place], MD;  [facility];  Service: Cardiovascular;  Laterality: N/A;  . CATARACT EXTRACTION, BILATERAL Right 07/10/2016   left eye 07/24/16  . CORONARY ARTERY BYPASS GRAFT  1999   (CAD) CABG was a 5-vessel bypass and had a questionable history of A fib. He underwent monitoring whick showed sinus bradycardia and PACs but no evidence of A fib.  . ENDOVASCULAR STENT INSERTION N/A 12/28/2015   Procedure: ENDOVASCULAR STENT GRAFT INSERTION;  Surgeon: Nada LibmanVance W Brabham, MD;  Location: [facility] OR;  Service: Vascular;  Laterality: N/A;  . IR RADIOLOGIST EVAL & MGMT  03/24/2017  . IR RADIOLOGIST EVAL & MGMT  04/14/2018  . LEFT HEART CATHETERIZATION WITH  CORONARY/GRAFT ANGIOGRAM  12/18/2012   Procedure: LEFT HEART CATHETERIZATION WITH Isabel Caprice;  Surgeon: [name], MD;  Location[facility] CATH LAB;  Service: Cardiovascular;;  . LUMBAR DISC SURGERY    . open heart surgery  03/1998  . TONSILLECTOMY      Allergies: Other; Cholecalciferol [vitamin d-3]; Fish oil; Aspirin; and Oxycodone  Medications: Prior to Admission medications   Medication Sig Start Date End Date Taking? Authorizing  Provider  acetaminophen (TYLENOL) 500 MG tablet Take 500 mg by mouth every 6 (six) hours as needed for mild pain.   Yes [provider]  apixaban (ELIQUIS) 5 MG TABS tablet Take 1 tablet (5 mg total) by mouth 2 (two) times daily. 04/12/18  Yes Chrystie Nose, MD  aspirin EC 81 MG EC tablet Take 1 tablet (81 mg total) by mouth daily. 12/22/12  Yes Kilroy, Luke K, PA-C  atorvastatin (LIPITOR) 80 MG tablet Take 1 tablet (80 mg total) by mouth daily at 6 PM. 03/29/18  Yes Hilty, Lisette Abu, MD  carboxymethylcellulose (REFRESH PLUS) 0.5 % SOLN Place 1 drop into both eyes 3 (three) times daily as needed (DRY EYES).   Yes [provider]  dofetilide (TIKOSYN) 500 MCG capsule Take 1 capsule (500 mcg total) by mouth 2 (two) times daily. 04/14/18  Yes Hilty, Lisette Abu, MD  furosemide (LASIX) 20 MG tablet TAKE 1 TABLET BY MOUTH EVERY DAY 02/11/18  Yes Hilty, Lisette Abu, MD  isosorbide mononitrate (IMDUR) 30 MG 24 hr tablet TAKE 1 TABLET BY MOUTH EVERY DAY 03/01/18  Yes Hilty, Lisette Abu, MD  KLOR-CON M20 20 MEQ tablet TAKE 1.5 TABLETS (30 MEQ TOTAL) BY MOUTH DAILY. 10/27/17  Yes Hilty, Lisette Abu, MD  lisinopril (PRINIVIL,ZESTRIL) 5 MG tablet TAKE 1 TABLET (5 MG TOTAL) BY MOUTH DAILY. 03/01/18  Yes Hilty, Lisette Abu, MD  LORazepam (ATIVAN) 0.5 MG tablet Take 0.5 mg by mouth 2 (two) times daily.  01/07/13  Yes Hilty, Lisette Abu, MD  Magnesium 400 MG CAPS Take 400 mg by mouth 2 (two) times daily. 07/31/16  Yes Hilty, Lisette Abu, MD  metFORMIN (GLUCOPHAGE-XR) 750 MG 24 hr tablet Take 1 tablet by mouth daily. 11/23/15  Yes [provider]  nitroGLYCERIN (NITROSTAT) 0.4 MG SL tablet Place 1 tablet (0.4 mg total) under the tongue every 5 (five) minutes as needed for chest pain. 12/22/12  Yes Kilroy, Luke K, PA-C  ranitidine (ZANTAC) 150 MG tablet Take 150 mg by mouth 2 (two) times daily.   Yes [provider]  albuterol (PROVENTIL HFA;VENTOLIN HFA) 108 (90 Base) MCG/ACT inhaler Inhale 1 puff into  the lungs every 6 (six) hours as needed for wheezing or shortness of breath.    [provider]     Family History  Problem Relation Age of Onset  . CAD Mother   . Heart disease Mother   . CAD Brother   . Heart attack Brother   . Hyperlipidemia Sister   . Leukemia Father   . CAD Maternal Grandmother   . Brain cancer Brother   . CAD Sister     Social History   Socioeconomic History  . Marital status: Divorced    Spouse name: Not on file  . Number of children: 1  . Years of education: Not on file  . Highest education level: Not on file  Occupational History  . Occupation: Geophysicist/field seismologist    Comment: employer = self  Social Needs  . Financial resource strain: Not on file  .  Food insecurity:    Worry: Not on file    Inability: Not on file  . Transportation needs:    Medical: Not on file    Non-medical: Not on file  Tobacco Use  . Smoking status: Former Smoker    Packs/day: 4.50    Years: 30.00    Pack years: 135.00    Last attempt to quit: 05/07/1987    Years since quitting: 31.0  . Smokeless tobacco: Former Neurosurgeon    Types: Chew  Substance and Sexual Activity  . Alcohol use: No  . Drug use: No  . Sexual activity: Not on file  Lifestyle  . Physical activity:    Days per week: Not on file    Minutes per session: Not on file  . Stress: Not on file  Relationships  . Social connections:    Talks on phone: Not on file    Gets together: Not on file    Attends religious service: Not on file    Active member of club or organization: Not on file    Attends meetings of clubs or organizations: Not on file    Relationship status: Not on file  Other Topics Concern  . Not on file  Social History Narrative  . Not on file     Review of Systems: A 12 point ROS discussed and pertinent positives are indicated in the HPI above.  All other systems are negative.  Review of Systems  Constitutional: Negative for appetite change, chills, fever and unexpected weight  change.  Respiratory: Positive for shortness of breath (with exertion at times). Negative for cough.   Cardiovascular: Negative for chest pain.  Gastrointestinal: Positive for abdominal pain (left lateral abdomen; waxes and wanes; chronic). Negative for blood in stool, constipation, diarrhea, nausea and vomiting.  Genitourinary: Negative for difficulty urinating, dysuria and hematuria.  Skin: Negative for rash.  Neurological: Negative for dizziness, syncope and headaches.  Psychiatric/Behavioral: Negative for confusion.    Vital Signs: BP (!) 154/81   Pulse 69   Temp 98.7 F (37.1 C) (Oral)   Resp 18   Ht 6\' 2"  (1.88 m)   Wt 280 lb (127 kg)   SpO2 93%   BMI 35.95 kg/m   Physical Exam  Constitutional: He is oriented to person, place, and time. No distress.  Daughter at beside; patient sitting up in bed, pleasant, talkative.   HENT:  Head: Normocephalic.  Cardiovascular: Normal rate, regular rhythm and normal heart sounds.  Pulmonary/Chest: Effort normal and breath sounds normal.  Abdominal: Soft. Bowel sounds are normal. He exhibits no distension. There is no tenderness.  Neurological: He is alert and oriented to person, place, and time.  Skin: Skin is warm and dry. He is not diaphoretic.  Psychiatric: He has a normal mood and affect. His behavior is normal. Judgment and thought content normal.  Vitals reviewed.    MD Evaluation Airway: WNL(Upper dentures) Heart: WNL Abdomen: WNL Chest/ Lungs: WNL ASA  Classification: 3 Mallampati/Airway Score: One   Imaging: Ir Radiologist Eval & Mgmt  Result Date: 04/14/2018 Please refer to notes tab for details about interventional procedure. (Op Note)   Labs:  CBC: Recent Labs    04/30/18 0703  WBC 7.9  HGB 12.7*  HCT 41.1  PLT 202    COAGS: Recent Labs    04/30/18 0703  INR 1.09    BMP: Recent Labs    04/30/18 0703  NA 141  K 4.3  CL 104  CO2 27  GLUCOSE  132*  BUN 19  CALCIUM 8.6*  CREATININE 1.13   GFRNONAA >60  GFRAA >60    LIVER FUNCTION TESTS: No results for input(s): BILITOT, AST, ALT, ALKPHOS, PROT, ALBUMIN in the last 8760 hours.  TUMOR MARKERS: No results for input(s): AFPTM, CEA, CA199, CHROMGRNA in the last 8760 hours.  Assessment and Plan:  Patient with history of AAA s/p endovascular repair 12/28/2015 with placement of Gore Excluder dveice followed by Dr. Myra Gianotti who presents today for percutaneous embolization of type II endoleak as previously discuss in IR clinic with Dr. Fredia Sorrow on 04/14/18.   Patient has been NPO since 7 pm last evening, last dose of Eliquis 7 pm on 12/2. He is afebrile, WBC 7.9, hgb 41.1, creatinine 1.13.  The risks and benefits of embolization were discussed with the patient and his daughter including, but not limited to bleeding, infection, vascular injury, post operative pain, or contrast induced renal failure.  This procedure involves the use of X-rays and because of the nature of the planned procedure, it is possible that we will have prolonged use of X-ray fluoroscopy.  Potential radiation risks to you include (but are not limited to) the following: - A slightly elevated risk for cancer several years later in life. This risk is typically less than 0.5% percent. This risk is low in comparison to the normal incidence of human cancer, which is 33% for women and 50% for men according to the American [employer] Society. - Radiation induced injury can include skin redness, resembling a rash, tissue breakdown / ulcers and hair loss (which can be temporary or permanent).  The likelihood of either of these occurring depends on the difficulty of the procedure and whether you are sensitive to radiation due to previous procedures, disease, or genetic conditions.  IF your procedure requires a prolonged use of radiation, you will be notified and given written instructions for further action. It is your responsibility to monitor the irradiated area for the 2  weeks following the procedure and to notify your physician if you are concerned that you have suffered a radiation induced injury.   All of the patient and daughter's questions were answered, patient is agreeable to proceed.  Consent signed and in chart.  Thank you for this interesting consult.  I greatly enjoyed meeting Ryan Wise and look forward to participating in their care.  A copy of this report was sent to the requesting provider on this date.  Electronically Signed: Villa Herb, PA-C 04/30/2018, 7:59 AM   I spent a total of 15 Minutes in face to face in clinical consultation, greater than 50% of which was counseling/coordinating care for type II endoleak repair.

## 2018-05-25 DIAGNOSIS — J019 Acute sinusitis, unspecified: Secondary | ICD-10-CM | POA: Diagnosis not present

## 2018-05-31 MED ORDER — DOFETILIDE 500 MCG PO CAPS: 500.0000 ug | ORAL_CAPSULE | Freq: Two times a day (BID) | ORAL | 3 refills | Status: DC

## 2018-05-31 MED ORDER — APIXABAN 5 MG PO TABS: 5.0000 mg | ORAL_TABLET | Freq: Two times a day (BID) | ORAL | 1 refills | Status: DC

## 2018-06-03 DIAGNOSIS — I129 Hypertensive chronic kidney disease with stage 1 through stage 4 chronic kidney disease, or unspecified chronic kidney disease: Secondary | ICD-10-CM | POA: Diagnosis not present

## 2018-06-03 DIAGNOSIS — E1121 Type 2 diabetes mellitus with diabetic nephropathy: Secondary | ICD-10-CM | POA: Diagnosis not present

## 2018-06-10 DIAGNOSIS — Z0000 Encounter for general adult medical examination without abnormal findings: Secondary | ICD-10-CM | POA: Diagnosis not present

## 2018-06-10 DIAGNOSIS — I129 Hypertensive chronic kidney disease with stage 1 through stage 4 chronic kidney disease, or unspecified chronic kidney disease: Secondary | ICD-10-CM | POA: Diagnosis not present

## 2018-06-21 ENCOUNTER — Emergency Department (HOSPITAL_COMMUNITY)
Admission: EM | Admit: 2018-06-21 | Discharge: 2018-06-21 | Disposition: A | Discharge status: Home / Self Care | Payer: Medicare Other | Attending: Emergency Medicine | Admitting: Emergency Medicine

## 2018-06-21 ENCOUNTER — Telehealth: Payer: Self-pay | Admitting: Internal Medicine

## 2018-06-21 ENCOUNTER — Encounter (HOSPITAL_COMMUNITY): Payer: Self-pay | Admitting: Emergency Medicine

## 2018-06-21 ENCOUNTER — Other Ambulatory Visit: Payer: Self-pay

## 2018-06-21 ENCOUNTER — Emergency Department (HOSPITAL_COMMUNITY): Payer: Medicare Other

## 2018-06-21 DIAGNOSIS — I110 Hypertensive heart disease with heart failure: Secondary | ICD-10-CM | POA: Diagnosis not present

## 2018-06-21 DIAGNOSIS — I5042 Chronic combined systolic (congestive) and diastolic (congestive) heart failure: Secondary | ICD-10-CM | POA: Diagnosis not present

## 2018-06-21 DIAGNOSIS — I6522 Occlusion and stenosis of left carotid artery: Secondary | ICD-10-CM

## 2018-06-21 DIAGNOSIS — Z87891 Personal history of nicotine dependence: Secondary | ICD-10-CM | POA: Insufficient documentation

## 2018-06-21 DIAGNOSIS — I6502 Occlusion and stenosis of left vertebral artery: Secondary | ICD-10-CM | POA: Diagnosis not present

## 2018-06-21 DIAGNOSIS — Z7982 Long term (current) use of aspirin: Secondary | ICD-10-CM | POA: Diagnosis not present

## 2018-06-21 DIAGNOSIS — M542 Cervicalgia: Secondary | ICD-10-CM

## 2018-06-21 DIAGNOSIS — Z79899 Other long term (current) drug therapy: Secondary | ICD-10-CM | POA: Insufficient documentation

## 2018-06-21 DIAGNOSIS — E119 Type 2 diabetes mellitus without complications: Secondary | ICD-10-CM | POA: Diagnosis not present

## 2018-06-21 DIAGNOSIS — I6523 Occlusion and stenosis of bilateral carotid arteries: Secondary | ICD-10-CM | POA: Diagnosis not present

## 2018-06-21 LAB — BASIC METABOLIC PANEL
Anion gap: 11 (ref 5–15)
BUN: 15 mg/dL (ref 8–23)
CO2: 26 mmol/L (ref 22–32)
Calcium: 8.9 mg/dL (ref 8.9–10.3)
Chloride: 100 mmol/L (ref 98–111)
Creatinine, Ser: 1.24 mg/dL (ref 0.61–1.24)
GFR calc Af Amer: >60 mL/min (ref >60)
GFR calc non Af Amer: 56 mL/min — ABNORMAL LOW (ref >60)
Glucose, Bld: 127 mg/dL — ABNORMAL HIGH (ref 70–99)
Potassium: 4 mmol/L (ref 3.5–5.1)
Sodium: 137 mmol/L (ref 135–145)

## 2018-06-21 LAB — CBC WITH DIFFERENTIAL/PLATELET
Abs Immature Granulocytes: 0.02 10*3/uL (ref 0.00–0.07)
Basophils Absolute: 0.1 10*3/uL (ref 0.0–0.1)
Basophils Relative: 1 %
Eosinophils Absolute: 0.2 10*3/uL (ref 0.0–0.5)
Eosinophils Relative: 2 %
HCT: 44.1 % (ref 39.0–52.0)
Hemoglobin: 13.4 g/dL (ref 13.0–17.0)
Immature Granulocytes: 0 %
Lymphocytes Relative: 21 %
Lymphs Abs: 1.8 10*3/uL (ref 0.7–4.0)
MCH: 27.6 pg (ref 26.0–34.0)
MCHC: 30.4 g/dL (ref 30.0–36.0)
MCV: 90.7 fL (ref 80.0–100.0)
Monocytes Absolute: 1 10*3/uL (ref 0.1–1.0)
Monocytes Relative: 11 %
Neutro Abs: 5.6 10*3/uL (ref 1.7–7.7)
Neutrophils Relative %: 65 %
Platelets: 248 10*3/uL (ref 150–400)
RBC: 4.86 MIL/uL (ref 4.22–5.81)
RDW: 14.9 % (ref 11.5–15.5)
WBC: 8.6 10*3/uL (ref 4.0–10.5)
nRBC: 0 % (ref 0.0–0.2)

## 2018-06-21 MED ORDER — IOPAMIDOL (ISOVUE-370) INJECTION 76%
INTRAVENOUS | Status: AC
  Filled 2018-06-21: qty 50

## 2018-06-21 MED ORDER — METHOCARBAMOL 500 MG PO TABS: 500.0000 mg | ORAL_TABLET | Freq: Two times a day (BID) | ORAL | 0 refills | Status: DC

## 2018-06-21 MED ORDER — IOPAMIDOL (ISOVUE-370) INJECTION 76%
50.0000 mL | Freq: Once | INTRAVENOUS | Status: AC | PRN
  Administered 2018-06-21: 50 mL via INTRAVENOUS

## 2018-06-21 NOTE — ED Provider Notes (Signed)
[facility] EMERGENCY DEPARTMENT Provider Note   CSN: [id number] Arrival date & time: 06/21/18  1057     History   Chief Complaint Chief Complaint  Patient presents with  . Neck Pain    HPI Nitin Holverson is a 78 y.o. male.  HPI   78 year old male presents today with complaints of posterior neck pain.  He notes approximately 2 days ago he was unloading close from the dryer.  He notes after folding the close and sitting down he developed a severe pain in the posterior aspect of his neck.  He notes this is high up in the neck but radiates down to lower cervical region.  He notes stiffness with pain with forward flexion lateral rotation and extension.  Patient denies any headache, fever, dizziness, chest pain or shortness of breath.  He denies any distal neurological deficits.  No trauma preceding this.  He notes taking Tylenol with no significant improvement in his symptoms.    Past Medical History:  Diagnosis Date  . Aneurysm of infrarenal abdominal aorta (HCC)    a. 01/2016 s/p endovascular repair of AAA (EVAR).  . Arthritis   . Chronic combined systolic and diastolic CHF (congestive heart failure) (HCC)    a. 01/2016 Echo: EF 35-40%, diff HK, mildly dil Ao root, mild MR, mildly dil LA.  Marland Kitchen Coronary artery disease    a. 1999 s/p CABG x4 (LIMA->LAD, VG->OM, VG->Diag, VG->RCA); b. 11/2012 Inferior STEMI/PCI: LIMA->LAD ok, VG->Diag 100, VG->OM 100, VG->RCA was culprit (PTCA/thrombectomy->Xience Xpedition DES), EF 25%.  Marland Kitchen GERD (gastroesophageal reflux disease)   . High triglycerides   . Hypertension   . Ischemic Cardiomyopathy    a. 01/2016 Echo: EF 35-40%, diff HK, inflat, inf AK.  . Obesity   . Persistent atrial fibrillation    a. Dx 01/2016-->slow vent response on beta blocker;  b. CHA2DS2VASc = 6-->Eliquis;  c. 03/03/2016 unsuccessful DCCV.  . Ruptured lumbar disc   . ST elevation myocardial infarction (STEMI) of inferior wall (HCC) 12/18/12   STEMI of inf. wall  w/PCI with Xperdition stent to VG to distal RCA  . Type II diabetes mellitus [facility])     Patient Active Problem List   Diagnosis Date Noted  . Atrial fibrillation (HCC) 07/31/2016  . Hypomagnesemia 07/31/2016  . Encntr for surgical aftcr following surgery on the circ sys 07/28/2016  . Visit for monitoring Tikosyn therapy 04/15/2016  . Other fatigue 02/14/2016  . Persistent atrial fibrillation 01/31/2016  . S/P AAA repair 01/31/2016  . AAA (abdominal aortic aneurysm) (HCC) 12/28/2015  . AAA (abdominal aortic aneurysm) without rupture (HCC) 12/27/2015  . Acute gastroenteritis 12/27/2015  . Chronic combined systolic and diastolic congestive heart failure (HCC) 12/27/2015  . DOE (dyspnea on exertion) 01/08/2013  . DJD (degenerative joint disease)- 5 back surgeries 12/22/2012  . Morbid obesity (HCC) 12/22/2012  . Acute renal insufficiency 12/22/2012  . Cardiomyopathy, ischemic 12/21/2012  . Dyslipidemia - high TG, on fibrate 12/20/2012  . Acute combined systolic and diastolic heart failure -  12/20/2012  . Coronary artery disease involving coronary bypass graft of native heart without angina pectoris 12/18/2012  . STEMI of inf. wall with PCI with Xpedition stent to VG to distal RCA; 12/18/12 12/18/2012    Past Surgical History:  Procedure Laterality Date  . BACK SURGERY     (5) back surgeries  . CARDIOVERSION N/A 03/03/2016   Procedure: CARDIOVERSION;  Surgeon: Chrystie Nose, MD;  Location: [facility] ENDOSCOPY;  Service: Cardiovascular;  Laterality: N/A;  .  CARDIOVERSION N/A 03/12/2016   Procedure: CARDIOVERSION;  Surgeon: Chilton Siiffany [place], MD;  [facility];  Service: Cardiovascular;  Laterality: N/A;  . CATARACT EXTRACTION, BILATERAL Right 07/10/2016   left eye 07/24/16  . CORONARY ARTERY BYPASS GRAFT  1999   (CAD) CABG was a 5-vessel bypass and had a questionable history of A fib. He underwent monitoring whick showed sinus bradycardia and PACs but no evidence of A fib.  .  ENDOVASCULAR STENT INSERTION N/A 12/28/2015   Procedure: ENDOVASCULAR STENT GRAFT INSERTION;  Surgeon: Nada LibmanVance W Brabham, MD;  Location: [facility] OR;  Service: Vascular;  Laterality: N/A;  . IR ANGIOGRAM PELVIS SELECTIVE OR SUPRASELECTIVE  04/30/2018  . IR ANGIOGRAM SELECTIVE EACH ADDITIONAL VESSEL  04/30/2018  . IR ANGIOGRAM VISCERAL SELECTIVE  04/30/2018  . IR EMBO ARTERIAL NOT HEMORR HEMANG INC GUIDE ROADMAPPING  04/30/2018  . IR RADIOLOGIST EVAL & MGMT  03/24/2017  . IR RADIOLOGIST EVAL & MGMT  04/14/2018  . IR US GUIDE VASC ACCESS RIGHT  04/30/2018  . LEFT HEART CATHETERIZATION WITH CORONARY/GRAFT ANGIOGRAM  12/18/2012   Procedure: LEFT HEART CATHETERIZATION WITH Isabel CapriceORONARY/GRAFT ANGIOGRAM;  Surgeon: Lennette Biharihomas A Kelly, MD;  [facility];  Service: Cardiovascular;;  . LUMBAR DISC SURGERY    . open heart surgery  03/1998  . TONSILLECTOMY          Home Medications    Prior to Admission medications   Medication Sig Start Date End Date Taking? Authorizing Provider  acetaminophen (TYLENOL) 500 MG tablet Take 500 mg by mouth every 6 (six) hours as needed for mild pain.    [provider]  albuterol (PROVENTIL HFA;VENTOLIN HFA) 108 (90 Base) MCG/ACT inhaler Inhale 1 puff into the lungs every 6 (six) hours as needed for wheezing or shortness of breath.    [provider]  apixaban (ELIQUIS) 5 MG TABS tablet Take 1 tablet (5 mg total) by mouth 2 (two) times daily. 05/31/18   Chrystie NoseHilty, Kenneth C, MD  aspirin EC 81 MG EC tablet Take 1 tablet (81 mg total) by mouth daily. 12/22/12   Abelino DerrickKilroy, Luke K, PA-C  atorvastatin (LIPITOR) 80 MG tablet Take 1 tablet (80 mg total) by mouth daily at 6 PM. 03/29/18   Hilty, Lisette AbuKenneth C, MD  carboxymethylcellulose (REFRESH PLUS) 0.5 % SOLN Place 1 drop into both eyes 3 (three) times daily as needed (DRY EYES).    [provider]  dofetilide (TIKOSYN) 500 MCG capsule Take 1 capsule (500 mcg total) by mouth 2 (two) times daily. 05/31/18   Hilty, Lisette AbuKenneth C, MD    furosemide (LASIX) 20 MG tablet TAKE 1 TABLET BY MOUTH EVERY DAY 02/11/18   Hilty, Lisette AbuKenneth C, MD  isosorbide mononitrate (IMDUR) 30 MG 24 hr tablet TAKE 1 TABLET BY MOUTH EVERY DAY 03/01/18   Hilty, Lisette AbuKenneth C, MD  KLOR-CON M20 20 MEQ tablet TAKE 1.5 TABLETS (30 MEQ TOTAL) BY MOUTH DAILY. 10/27/17   Hilty, Lisette AbuKenneth C, MD  lisinopril (PRINIVIL,ZESTRIL) 5 MG tablet TAKE 1 TABLET (5 MG TOTAL) BY MOUTH DAILY. 03/01/18   Hilty, Lisette AbuKenneth C, MD  LORazepam (ATIVAN) 0.5 MG tablet Take 0.5 mg by mouth 2 (two) times daily.  01/07/13   Chrystie NoseHilty, Kenneth C, MD  Magnesium 400 MG CAPS Take 400 mg by mouth 2 (two) times daily. 07/31/16   Hilty, Lisette AbuKenneth C, MD  metFORMIN (GLUCOPHAGE-XR) 750 MG 24 hr tablet Take 1 tablet by mouth daily. 11/23/15   [provider]  methocarbamol (ROBAXIN) 500 MG tablet Take 1 tablet (  500 mg total) by mouth 2 (two) times daily. 06/21/18   Dorrell Mitcheltree, Tinnie Gens, PA-C  nitroGLYCERIN (NITROSTAT) 0.4 MG SL tablet Place 1 tablet (0.4 mg total) under the tongue every 5 (five) minutes as needed for chest pain. 12/22/12   Abelino Derrick, PA-C  ranitidine (ZANTAC) 150 MG tablet Take 150 mg by mouth 2 (two) times daily.    [provider]    Family History Family History  Problem Relation Age of Onset  . CAD Mother   . Heart disease Mother   . CAD Brother   . Heart attack Brother   . Hyperlipidemia Sister   . Leukemia Father   . CAD Maternal Grandmother   . Brain cancer Brother   . CAD Sister     Social History Social History   Tobacco Use  . Smoking status: Former Smoker    Packs/day: 4.50    Years: 30.00    Pack years: 135.00    Last attempt to quit: 05/07/1987    Years since quitting: 31.1  . Smokeless tobacco: Former Neurosurgeon    Types: Chew  Substance Use Topics  . Alcohol use: No  . Drug use: No     Allergies   Other; Cholecalciferol [vitamin d-3]; Fish oil; Aspirin; and Oxycodone   Review of Systems Review of Systems  All other systems reviewed and are  negative.    Physical Exam Updated Vital Signs BP 108/75 (BP Location: Right Arm)   Pulse 86   Temp 97.7 F (36.5 C) (Oral)   Resp 18   Ht 6' (1.829 m)   Wt 133.8 kg   SpO2 96%   BMI 40.01 kg/m   Physical Exam Vitals signs and nursing note reviewed.  Constitutional:      Appearance: He is well-developed.  HENT:     Head: Normocephalic and atraumatic.     Comments: No carotid bruits Eyes:     General: No scleral icterus.       Right eye: No discharge.        Left eye: No discharge.     Conjunctiva/sclera: Conjunctivae normal.     Pupils: Pupils are equal, round, and reactive to light.  Neck:     Musculoskeletal: Normal range of motion.     Vascular: No JVD.     Trachea: No tracheal deviation.  Pulmonary:     Effort: Pulmonary effort is normal.     Breath sounds: No stridor.  Musculoskeletal:     Comments: Neck without any swelling, erythema or rash, tenderness to the musculature in the upper cervical region, tenderness palpation of the bilateral cervical soft tissue around C7-limited flexion extension internal and external rotation secondary to pain-bilateral upper and lower extremity sensation strength and motor function intact  Neurological:     Mental Status: He is alert and oriented to person, place, and time.     Coordination: Coordination normal.  Psychiatric:        Behavior: Behavior normal.        Thought Content: Thought content normal.        Judgment: Judgment normal.      ED Treatments / Results  Labs (all labs ordered are listed, but only abnormal results are displayed) Labs Reviewed  BASIC METABOLIC PANEL - Abnormal; Notable for the following components:      Result Value   Glucose, Bld 127 (*)    GFR calc non Af Amer 56 (*)    All other components within normal limits  CBC  WITH DIFFERENTIAL/PLATELET    EKG None  Radiology Ct Angio Head W Or Wo Contrast  Result Date: 06/21/2018 CLINICAL DATA:  Bilateral neck pain and stiffness over the  last 2 days. EXAM: CT ANGIOGRAPHY HEAD AND NECK TECHNIQUE: Multidetector CT imaging of the head and neck was performed using the standard protocol during bolus administration of intravenous contrast. Multiplanar CT image reconstructions and MIPs were obtained to evaluate the vascular anatomy. Carotid stenosis measurements (when applicable) are obtained utilizing NASCET criteria, using the distal internal carotid diameter as the denominator. CONTRAST:  50mL ISOVUE-370 IOPAMIDOL (ISOVUE-370) INJECTION 76% COMPARISON:  None. FINDINGS: Head CT Brain: Mild chronic small-vessel ischemic change of the white matter. No sign of acute infarction, mass lesion, hemorrhage, hydrocephalus or extra-axial collection. Vascular: There is atherosclerotic calcification of the major vessels at the base of the brain. Skull: Normal. No traumatic finding. No focal bone lesion. Sinuses/Orbits: Sinuses are clear. Orbits appear normal. Mastoids are clear. Other: None significant CTA NECK FINDINGS Aortic arch: Aortic atherosclerosis and mild ectasia. Ascending aorta measures up to 3.6 cm in diameter. Previous median sternotomy and CABG. Prominent pulmonary arteries. Branching pattern of the brachiocephalic vessels is normal without origin stenosis. Right carotid system: Innominate artery is ectatic. Right common carotid artery is widely patent to the bifurcation. There is atherosclerotic calcification at the carotid bifurcation and ICA bulb but no stenosis. Cervical ICA is mildly tortuous but widely patent without dissection. Left carotid system: Common carotid artery is widely patent to the bifurcation region. There is soft and calcified plaque at the carotid bifurcation and ICA bulb. Minimal diameter in the ICA bulb is 3.5 mm. Compared to a more distal cervical ICA diameter of 5 mm, this indicates a 30% stenosis. No sign of carotid dissection. Vertebral arteries: Subclavian arteries are mildly ectatic. Both vertebral artery origins are  widely patent without soft or calcified plaque. Both vertebral arteries are widely patent through the cervical region to the foramen magnum. No evidence of vertebral dissection. Skeleton: Degenerative cervical spondylosis and facet osteoarthritis which could be painful. No gross compressive stenosis is evident. Other neck: No acute soft tissue finding. No evidence of mass or lymphadenopathy. No evidence of inflammatory disease. Thyroid nodule or cyst on the left measuring up to 2.3 cm in diameter. This is similar to a previous chest CT of October 2018 in therefore likely benign. Upper chest: Otherwise negative Review of the MIP images confirms the above findings CTA HEAD FINDINGS Anterior circulation: Both internal carotid arteries are patent through the skull base to the siphon regions. There is extensive atherosclerotic calcification in both carotid siphon regions with stenosis estimated at 50% on each side. Supraclinoid internal carotid arteries are widely patent. The anterior and middle cerebral vessels are patent without proximal stenosis, aneurysm or vascular malformation. There may be a 30% stenosis of the M1 segment on the right. Posterior circulation: Both vertebral arteries are patent through the foramen magnum. There is atherosclerotic disease of both V4 segments. Stenosis on the left is estimated at 80%. Stenosis on the right is estimated at 30-50%. Both vessels reach the basilar. No basilar stenosis. Posterior circulation branch vessels are patent. Venous sinuses: Patent and normal. Anatomic variants: None significant. Delayed phase: No abnormal enhancement. Review of the MIP images confirms the above findings IMPRESSION: 1. No cause of acute neck pain and stiffness is identified. No acute vascular dissection. 2. Atherosclerotic disease at both carotid bifurcations. No stenosis on the right. 30% stenosis in the ICA bulb on the left. 3. Atherosclerotic  disease in both carotid siphon regions with stenosis  estimated at 50% on each side. 4. Atherosclerotic disease of both V4 segments. Stenosis on the left is estimated at 80%. Stenosis on the right is estimated at 30-50%. 5. No intracranial large or medium vessel occlusion or correctable proximal stenosis. 6. No soft tissue neck lesion of significance identified. Low-density thyroid cyst or nodule on the left unchanged since 2018 and quite likely benign. Chronic degenerative cervical spondylosis which could be a cause of neck pain. No evidence of severe canal stenosis. Ordinary mild foraminal narrowing. Electronically Signed   By: Paulina Fusi M.D.   On: 06/21/2018 15:34   Ct Angio Neck W And/or Wo Contrast  Result Date: 06/21/2018 CLINICAL DATA:  Bilateral neck pain and stiffness over the last 2 days. EXAM: CT ANGIOGRAPHY HEAD AND NECK TECHNIQUE: Multidetector CT imaging of the head and neck was performed using the standard protocol during bolus administration of intravenous contrast. Multiplanar CT image reconstructions and MIPs were obtained to evaluate the vascular anatomy. Carotid stenosis measurements (when applicable) are obtained utilizing NASCET criteria, using the distal internal carotid diameter as the denominator. CONTRAST:  50mL ISOVUE-370 IOPAMIDOL (ISOVUE-370) INJECTION 76% COMPARISON:  None. FINDINGS: Head CT Brain: Mild chronic small-vessel ischemic change of the white matter. No sign of acute infarction, mass lesion, hemorrhage, hydrocephalus or extra-axial collection. Vascular: There is atherosclerotic calcification of the major vessels at the base of the brain. Skull: Normal. No traumatic finding. No focal bone lesion. Sinuses/Orbits: Sinuses are clear. Orbits appear normal. Mastoids are clear. Other: None significant CTA NECK FINDINGS Aortic arch: Aortic atherosclerosis and mild ectasia. Ascending aorta measures up to 3.6 cm in diameter. Previous median sternotomy and CABG. Prominent pulmonary arteries. Branching pattern of the brachiocephalic  vessels is normal without origin stenosis. Right carotid system: Innominate artery is ectatic. Right common carotid artery is widely patent to the bifurcation. There is atherosclerotic calcification at the carotid bifurcation and ICA bulb but no stenosis. Cervical ICA is mildly tortuous but widely patent without dissection. Left carotid system: Common carotid artery is widely patent to the bifurcation region. There is soft and calcified plaque at the carotid bifurcation and ICA bulb. Minimal diameter in the ICA bulb is 3.5 mm. Compared to a more distal cervical ICA diameter of 5 mm, this indicates a 30% stenosis. No sign of carotid dissection. Vertebral arteries: Subclavian arteries are mildly ectatic. Both vertebral artery origins are widely patent without soft or calcified plaque. Both vertebral arteries are widely patent through the cervical region to the foramen magnum. No evidence of vertebral dissection. Skeleton: Degenerative cervical spondylosis and facet osteoarthritis which could be painful. No gross compressive stenosis is evident. Other neck: No acute soft tissue finding. No evidence of mass or lymphadenopathy. No evidence of inflammatory disease. Thyroid nodule or cyst on the left measuring up to 2.3 cm in diameter. This is similar to a previous chest CT of October 2018 in therefore likely benign. Upper chest: Otherwise negative Review of the MIP images confirms the above findings CTA HEAD FINDINGS Anterior circulation: Both internal carotid arteries are patent through the skull base to the siphon regions. There is extensive atherosclerotic calcification in both carotid siphon regions with stenosis estimated at 50% on each side. Supraclinoid internal carotid arteries are widely patent. The anterior and middle cerebral vessels are patent without proximal stenosis, aneurysm or vascular malformation. There may be a 30% stenosis of the M1 segment on the right. Posterior circulation: Both vertebral arteries  are patent through  the foramen magnum. There is atherosclerotic disease of both V4 segments. Stenosis on the left is estimated at 80%. Stenosis on the right is estimated at 30-50%. Both vessels reach the basilar. No basilar stenosis. Posterior circulation branch vessels are patent. Venous sinuses: Patent and normal. Anatomic variants: None significant. Delayed phase: No abnormal enhancement. Review of the MIP images confirms the above findings IMPRESSION: 1. No cause of acute neck pain and stiffness is identified. No acute vascular dissection. 2. Atherosclerotic disease at both carotid bifurcations. No stenosis on the right. 30% stenosis in the ICA bulb on the left. 3. Atherosclerotic disease in both carotid siphon regions with stenosis estimated at 50% on each side. 4. Atherosclerotic disease of both V4 segments. Stenosis on the left is estimated at 80%. Stenosis on the right is estimated at 30-50%. 5. No intracranial large or medium vessel occlusion or correctable proximal stenosis. 6. No soft tissue neck lesion of significance identified. Low-density thyroid cyst or nodule on the left unchanged since 2018 and quite likely benign. Chronic degenerative cervical spondylosis which could be a cause of neck pain. No evidence of severe canal stenosis. Ordinary mild foraminal narrowing. Electronically Signed   By: Paulina FusiMark  Shogry M.D.   On: 06/21/2018 15:34    Procedures Procedures (including critical care time)  Medications Ordered in ED Medications  iopamidol (ISOVUE-370) 76 % injection (has no administration in time range)  iopamidol (ISOVUE-370) 76 % injection 50 mL (50 mLs Intravenous Contrast Given 06/21/18 1506)     Initial Impression / Assessment and Plan / ED Course  I have reviewed the triage vital signs and the nursing notes.  Pertinent labs & imaging results that were available during my care of the patient were reviewed by me and considered in my medical decision making (see chart for  details).     78 year old male presents today with complaints of neck pain.  Patient notes a high tolerance for pain and is having severe posterior neck pain that was atraumatic.  Differential includes disc pathology, nerve pathology, muscular pathology, and vascular pathology.  Given the atraumatic nature of his symptoms with severity I do feel that a CT angios of his head and neck would be appropriate to rule out any posterior arterial dissection.  I discussed the risks and benefits of CT angiography with the patient he would like to proceed at this time as he feels this pain is unlike anything he is previously had.  CT shows no vertebral artery abnormalities.  He did have incidental findings of stenosis which I discussed with him.  He will follow-up with vascular specialist for evaluation of this.  Patient will be given prescription for muscle relaxers, encouraged use Tylenol, return immediately if develops any new or worsening signs or symptoms, follow-up with orthopedic specialist symptoms persist.  Patient verbalized understanding and agreement to today's plan had no further questions or concerns.  Final Clinical Impressions(s) / ED Diagnoses   Final diagnoses:  Neck pain  Stenosis of left carotid artery    ED Discharge Orders         Ordered    methocarbamol (ROBAXIN) 500 MG tablet  2 times daily     06/21/18 1600           Eyvonne MechanicHedges, Brantlee Penn, PA-C 06/21/18 1602    Melene PlanFloyd, Dan, DO 06/21/18 1906

## 2018-06-21 NOTE — Discharge Instructions (Addendum)
Please read attached information. If you experience any new or worsening signs or symptoms please return to the emergency room for evaluation. Please follow-up with your primary care provider or specialist as discussed.  Please discuss your CT findings with your cardiovascular specialist.  Please use medication prescribed only as directed and discontinue taking if you have any concerning signs or symptoms.

## 2018-06-21 NOTE — ED Triage Notes (Signed)
Onset 2 days ago developed neck pain after taking a tub of clothes out of the dryer. Pain currently 5/10 achy sore and increases with movement 10/10 achy sharp denies tingling or numbness bilateral extremities. C-collar applied in triage.

## 2018-06-21 NOTE — Telephone Encounter (Signed)
° °  Patient and his daughter Marcelino DusterMichelle calling to report pt went to ED, concerned about CT results; blockage. Requesting call from nurse to discuss

## 2018-06-21 NOTE — ED Notes (Signed)
Pt verbalized understanding of d/c instructions and has no further questions, VSS, NAD.  

## 2018-06-21 NOTE — ED Notes (Signed)
Patient transported to CT 

## 2018-06-21 NOTE — Telephone Encounter (Signed)
Daughter states. she took patient  To see a chiropractor for neck pain-  He informed her to take patient to er for an Ct scan today.(SEE REPORT) resulte were given to her and patient . She was instructed to contact  Cardiology about results.  Marcelino Duster ( daughter) wanted to know if patient appointment neede to be moved up from April 2020 or  Any further testing is needed.   Note:  Patient had carotid doppler done about 10 months ago by done Dr Myra Gianotti and has recall to see him in April 2020 but appointment has not been made.   Marcelino Duster is aware will  Defer dr Rennis Golden and contact her with answer.

## 2018-06-22 ENCOUNTER — Other Ambulatory Visit: Payer: Self-pay | Admitting: Interventional Radiology

## 2018-06-22 ENCOUNTER — Other Ambulatory Visit: Payer: Self-pay | Admitting: Surgery

## 2018-06-22 DIAGNOSIS — IMO0002 Reserved for concepts with insufficient information to code with codable children: Secondary | ICD-10-CM

## 2018-06-22 DIAGNOSIS — T82330A Leakage of aortic (bifurcation) graft (replacement), initial encounter: Secondary | ICD-10-CM

## 2018-06-22 NOTE — Telephone Encounter (Signed)
Notified daughter of MD recommendation. Advised her that Dr. Myra Gianotti follows patient's carotids and is due in April 2020. She will reach out to his office as well.

## 2018-06-22 NOTE — Telephone Encounter (Signed)
LMTCB

## 2018-06-22 NOTE — Telephone Encounter (Signed)
I reviewed the CT angiogram - April is fine for follow-up.  Dr. Rexene Edison

## 2018-07-02 NOTE — Telephone Encounter (Signed)
Spoke with Mr Ryan Wise he states he has been having itching of his scalp and feels like something crawling on his head. He states his PCP had him start flaxseed oil 2 weeks ago and the Sx started several days after that. He states he is going to try stopping the flaxseed oil for a few days and see if his symptoms resolve. He states he is allergic to fish oil and some other OTC supplements. Reviewed anaphylaxis and stroke symptoms with patient insrtucted him to go to ER if he develops any of these symptoms. Patient verbalized understanding. Patient is aware of his appt with Dr Myra Gianotti on 07/12/2018.

## 2018-07-08 ENCOUNTER — Encounter: Payer: Self-pay | Admitting: Radiology

## 2018-07-08 ENCOUNTER — Ambulatory Visit
Admission: RE | Admit: 2018-07-08 | Discharge: 2018-07-08 | Disposition: A | Discharge status: Home / Self Care | Payer: Medicare Other | Source: Ambulatory Visit | Attending: Radiology | Admitting: Radiology

## 2018-07-08 ENCOUNTER — Ambulatory Visit
Admission: RE | Admit: 2018-07-08 | Discharge: 2018-07-08 | Disposition: A | Discharge status: Home / Self Care | Payer: Medicare Other | Source: Ambulatory Visit | Attending: Surgery | Admitting: Surgery

## 2018-07-08 DIAGNOSIS — I671 Cerebral aneurysm, nonruptured: Secondary | ICD-10-CM | POA: Diagnosis not present

## 2018-07-08 DIAGNOSIS — T82330A Leakage of aortic (bifurcation) graft (replacement), initial encounter: Secondary | ICD-10-CM

## 2018-07-08 DIAGNOSIS — T82330D Leakage of aortic (bifurcation) graft (replacement), subsequent encounter: Secondary | ICD-10-CM | POA: Diagnosis not present

## 2018-07-08 DIAGNOSIS — IMO0002 Reserved for concepts with insufficient information to code with codable children: Secondary | ICD-10-CM

## 2018-07-08 HISTORY — PX: IR RADIOLOGIST EVAL & MGMT: IMG5224

## 2018-07-08 MED ORDER — IOPAMIDOL (ISOVUE-370) INJECTION 76%
75.0000 mL | Freq: Once | INTRAVENOUS | Status: AC | PRN
  Administered 2018-07-08: 75 mL via INTRAVENOUS

## 2018-07-08 NOTE — Progress Notes (Signed)
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  History of Present Illness: Ryan Wise is a 78 y.o. male who underwent arteriography and embolization of a type II endoleak 2 months ago.  Arteriography did show some collateral reconstitution of a right-sided L4 lumbar artery without visualized retrograde perfusion of the aneurysm sac.  Arteriography at the level of the SMA demonstrated a prominent arc of [place] supplying the IMA.  After catheterization of the IMA trunk, arteriography demonstrated retrograde perfusion of the aneurysm sac by the IMA.  This was successfully catheterized and coils placed in the anterior aneurysm sac as well as extending into the IMA trunk.  The procedure was well-tolerated and the patient has experienced no abdominal pain or gastrointestinal symptoms following embolization.  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  He was evaluated in the Emergency Department on 06/21/2018.  CT angiography of the head and neck was performed demonstrating no evidence of vascular dissection.  Note was made of atherosclerosis involving the distal vertebral arteries at the skull base, left greater than right with probable significant stenosis of the left V4 segment.  He has not had any symptoms of vertebrobasilar insufficiency or other stroke-like symptoms.  Note was made on that study of diffuse cervical disc disease.  He has not had additional evaluation for cervical spondylosis.  He has been regularly applying ice to his neck and taking Tylenol.  He was given a prescription for a muscle relaxant which she has yet to fill or use.  He is scheduled to follow-up with Dr. Trula Slade next week for vascular findings on the CTA and believes that he may be scheduled for a carotid duplex ultrasound.  Past  Medical History:  Diagnosis Date  . Aneurysm of infrarenal abdominal aorta ([place])    a. 01/2016 s/p endovascular repair of AAA (EVAR).  . Arthritis   . Chronic combined systolic and diastolic CHF (congestive heart failure) ([place])    a. 01/2016 Echo: EF 35-40%, diff HK, mildly dil Ao root, mild MR, mildly dil LA.  Marland Kitchen Coronary artery disease    a. 1999 s/p CABG x4 (LIMA->LAD, VG->OM, VG->Diag, VG->RCA); b. 11/2012 Inferior STEMI/PCI: LIMA->LAD ok, VG->Diag 100, VG->OM 100, VG->RCA was culprit (PTCA/thrombectomy->Xience Xpedition DES), EF 25%.  Marland Kitchen GERD (gastroesophageal reflux disease)   . High triglycerides   . Hypertension   . Ischemic Cardiomyopathy    a. 01/2016 Echo: EF 35-40%, diff HK, inflat, inf AK.  . Obesity   . Persistent atrial fibrillation    a. Dx 01/2016-->slow vent response on beta blocker;  b. CHA2DS2VASc = 6-->Eliquis;  c. 03/03/2016 unsuccessful DCCV.  . Ruptured lumbar disc   . ST elevation myocardial infarction (STEMI) of inferior wall ([place]) 12/18/12   STEMI of inf. wall w/PCI with Xperdition stent to VG to distal RCA  . Type II diabetes mellitus ([place])     Past Surgical History:  Procedure Laterality Date  . BACK SURGERY     (5) back surgeries  . CARDIOVERSION N/A 03/03/2016   Procedure: CARDIOVERSION;  Surgeon: Pixie Casino, MD;  Location: [facility] ENDOSCOPY;  Service: Cardiovascular;  Laterality: N/A;  . CARDIOVERSION N/A 03/12/2016   Procedure: CARDIOVERSION;  Surgeon: Skeet Latch, MD;  Location: [facility];  Service: Cardiovascular;  Laterality: N/A;  . CATARACT EXTRACTION, BILATERAL Right 07/10/2016   left eye 07/24/16  . CORONARY ARTERY BYPASS  GRAFT  1999   (CAD) CABG was a 5-vessel bypass and had a questionable history of A fib. He underwent monitoring whick showed sinus bradycardia and PACs but no evidence of A fib.  . ENDOVASCULAR STENT INSERTION N/A 12/28/2015   Procedure: ENDOVASCULAR STENT GRAFT INSERTION;  Surgeon: Serafina Mitchell, MD;  Location: [facility];   Service: Vascular;  Laterality: N/A;  . IR ANGIOGRAM PELVIS SELECTIVE OR SUPRASELECTIVE  04/30/2018  . IR ANGIOGRAM SELECTIVE EACH ADDITIONAL VESSEL  04/30/2018  . IR ANGIOGRAM VISCERAL SELECTIVE  04/30/2018  . IR EMBO ARTERIAL NOT HEMORR HEMANG INC GUIDE ROADMAPPING  04/30/2018  . IR RADIOLOGIST EVAL & MGMT  03/24/2017  . IR RADIOLOGIST EVAL & MGMT  04/14/2018  . IR RADIOLOGIST EVAL & MGMT  07/08/2018  . IR US GUIDE VASC ACCESS RIGHT  04/30/2018  . LEFT HEART CATHETERIZATION WITH CORONARY/GRAFT ANGIOGRAM  12/18/2012   Procedure: LEFT HEART CATHETERIZATION WITH Beatrix Fetters;  Surgeon: Troy Sine, MD;  Location: [facility] CATH LAB;  Service: Cardiovascular;;  . LUMBAR DISC SURGERY    . open heart surgery  03/1998  . TONSILLECTOMY      Allergies: Other; Cholecalciferol [vitamin d-3]; Fish oil; Aspirin; and Oxycodone  Medications: Prior to Admission medications   Medication Sig Start Date End Date Taking? Authorizing Provider  Flaxseed, Linseed, (FLAX SEED OIL PO) Take 2 capsules by mouth 2 (two) times daily.   Yes [provider]  acetaminophen (TYLENOL) 500 MG tablet Take 500 mg by mouth every 6 (six) hours as needed for mild pain.    [provider]  albuterol (PROVENTIL HFA;VENTOLIN HFA) 108 (90 Base) MCG/ACT inhaler Inhale 1 puff into the lungs every 6 (six) hours as needed for wheezing or shortness of breath.    [provider]  apixaban (ELIQUIS) 5 MG TABS tablet Take 1 tablet (5 mg total) by mouth 2 (two) times daily. 05/31/18   Pixie Casino, MD  aspirin EC 81 MG EC tablet Take 1 tablet (81 mg total) by mouth daily. 12/22/12   Erlene Quan, PA-C  atorvastatin (LIPITOR) 80 MG tablet Take 1 tablet (80 mg total) by mouth daily at 6 PM. 03/29/18   Hilty, Nadean Corwin, MD  carboxymethylcellulose (REFRESH PLUS) 0.5 % SOLN Place 1 drop into both eyes 3 (three) times daily as needed (DRY EYES).    [provider]  dofetilide (TIKOSYN) 500 MCG capsule  Take 1 capsule (500 mcg total) by mouth 2 (two) times daily. 05/31/18   Hilty, Nadean Corwin, MD  furosemide (LASIX) 20 MG tablet TAKE 1 TABLET BY MOUTH EVERY DAY 02/11/18   Hilty, Nadean Corwin, MD  isosorbide mononitrate (IMDUR) 30 MG 24 hr tablet TAKE 1 TABLET BY MOUTH EVERY DAY 03/01/18   Hilty, Nadean Corwin, MD  KLOR-CON M20 20 MEQ tablet TAKE 1.5 TABLETS (30 MEQ TOTAL) BY MOUTH DAILY. 10/27/17   Hilty, Nadean Corwin, MD  lisinopril (PRINIVIL,ZESTRIL) 5 MG tablet TAKE 1 TABLET (5 MG TOTAL) BY MOUTH DAILY. 03/01/18   Hilty, Nadean Corwin, MD  LORazepam (ATIVAN) 0.5 MG tablet Take 0.5 mg by mouth 2 (two) times daily.  01/07/13   Pixie Casino, MD  Magnesium 400 MG CAPS Take 400 mg by mouth 2 (two) times daily. 07/31/16   Hilty, Nadean Corwin, MD  metFORMIN (GLUCOPHAGE-XR) 750 MG 24 hr tablet Take 1 tablet by mouth daily. 11/23/15   [provider]  methocarbamol (ROBAXIN) 500 MG tablet Take 1 tablet (500 mg total) by mouth 2 (  two) times daily. 06/21/18   Hedges, Dellis Filbert, PA-C  nitroGLYCERIN (NITROSTAT) 0.4 MG SL tablet Place 1 tablet (0.4 mg total) under the tongue every 5 (five) minutes as needed for chest pain. 12/22/12   Erlene Quan, PA-C  ranitidine (ZANTAC) 150 MG tablet Take 150 mg by mouth 2 (two) times daily.    [provider]     Family History  Problem Relation Age of Onset  . CAD Mother   . Heart disease Mother   . CAD Brother   . Heart attack Brother   . Hyperlipidemia Sister   . Leukemia Father   . CAD Maternal Grandmother   . Brain cancer Brother   . CAD Sister     Social History   Socioeconomic History  . Marital status: Divorced    Spouse name: Not on file  . Number of children: 1  . Years of education: Not on file  . Highest education level: Not on file  Occupational History  . 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 Financial resource strain: Not on file  . Food insecurity:    Worry: Not on file    Inability: Not on file  .  Transportation needs:    Medical: Not on file    Non-medical: Not on file  Tobacco Use  . Smoking status: Former Smoker    Packs/day: 4.50    Years: 30.00    Pack years: 135.00    Last attempt to quit: 05/07/1987    Years since quitting: 31.1  . Smokeless tobacco: Former Systems developer    Types: Chew  Substance and Sexual Activity  . Alcohol use: No  . Drug use: No  . Sexual activity: Not on file  Lifestyle  . Physical activity:    Days per week: Not on file    Minutes per session: Not on file  . Stress: Not on file  Relationships  . Social connections:    Talks on phone: Not on file    Gets together: Not on file    Attends religious service: Not on file    Active member of club or organization: Not on file    Attends meetings of clubs or organizations: Not on file    Relationship status: Not on file  Other Topics Concern  . Not on file  Social History Narrative  . Not on file     Review of Systems: A 12 point ROS discussed and pertinent positives are indicated in the HPI above.  All other systems are negative.  Review of Systems  Constitutional: Negative.   HENT: Negative.        Bilateral neck pain  Respiratory: Negative.   Cardiovascular: Negative.   Gastrointestinal: Negative.   Genitourinary: Negative.   Musculoskeletal: Negative.   Neurological: Negative.     Vital Signs: BP 114/70   Pulse 74   Temp 98.1 F (36.7 C) (Oral)   Resp 17   Ht 6' (1.829 m)   Wt 132.5 kg   SpO2 93%   BMI 39.60 kg/m   Physical Exam Vitals signs reviewed.  Constitutional:      General: He is not in acute distress.    Appearance: He is not ill-appearing, toxic-appearing or diaphoretic.  Neck:     Comments: No focal tenderness. Possibly some degree of paraspinal muscle spasm of cervical region bilaterally. Abdominal:     Palpations: Abdomen is soft.     Tenderness: There is no  abdominal tenderness. There is no guarding or rebound.     Hernia: No hernia is present.    Musculoskeletal:        General: No swelling.  Neurological:     Mental Status: He is alert and oriented to person, place, and time.     Imaging: Ct Angio Head W Or Wo Contrast  Result Date: 06/21/2018 CLINICAL DATA:  Bilateral neck pain and stiffness over the last 2 days. EXAM: CT ANGIOGRAPHY HEAD AND NECK TECHNIQUE: Multidetector CT imaging of the head and neck was performed using the standard protocol during bolus administration of intravenous contrast. Multiplanar CT image reconstructions and MIPs were obtained to evaluate the vascular anatomy. Carotid stenosis measurements (when applicable) are obtained utilizing NASCET criteria, using the distal internal carotid diameter as the denominator. CONTRAST:  17m ISOVUE-370 IOPAMIDOL (ISOVUE-370) INJECTION 76% COMPARISON:  None. FINDINGS: Head CT Brain: Mild chronic small-vessel ischemic change of the white matter. No sign of acute infarction, mass lesion, hemorrhage, hydrocephalus or extra-axial collection. Vascular: There is atherosclerotic calcification of the major vessels at the base of the brain. Skull: Normal. No traumatic finding. No focal bone lesion. Sinuses/Orbits: Sinuses are clear. Orbits appear normal. Mastoids are clear. Other: None significant CTA NECK FINDINGS Aortic arch: Aortic atherosclerosis and mild ectasia. Ascending aorta measures up to 3.6 cm in diameter. Previous median sternotomy and CABG. Prominent pulmonary arteries. Branching pattern of the brachiocephalic vessels is normal without origin stenosis. Right carotid system: Innominate artery is ectatic. Right common carotid artery is widely patent to the bifurcation. There is atherosclerotic calcification at the carotid bifurcation and ICA bulb but no stenosis. Cervical ICA is mildly tortuous but widely patent without dissection. Left carotid system: Common carotid artery is widely patent to the bifurcation region. There is soft and calcified plaque at the carotid bifurcation and  ICA bulb. Minimal diameter in the ICA bulb is 3.5 mm. Compared to a more distal cervical ICA diameter of 5 mm, this indicates a 30% stenosis. No sign of carotid dissection. Vertebral arteries: Subclavian arteries are mildly ectatic. Both vertebral artery origins are widely patent without soft or calcified plaque. Both vertebral arteries are widely patent through the cervical region to the foramen magnum. No evidence of vertebral dissection. Skeleton: Degenerative cervical spondylosis and facet osteoarthritis which could be painful. No gross compressive stenosis is evident. Other neck: No acute soft tissue finding. No evidence of mass or lymphadenopathy. No evidence of inflammatory disease. Thyroid nodule or cyst on the left measuring up to 2.3 cm in diameter. This is similar to a previous chest CT of October 2018 in therefore likely benign. Upper chest: Otherwise negative Review of the MIP images confirms the above findings CTA HEAD FINDINGS Anterior circulation: Both internal carotid arteries are patent through the skull base to the siphon regions. There is extensive atherosclerotic calcification in both carotid siphon regions with stenosis estimated at 50% on each side. Supraclinoid internal carotid arteries are widely patent. The anterior and middle cerebral vessels are patent without proximal stenosis, aneurysm or vascular malformation. There may be a 30% stenosis of the M1 segment on the right. Posterior circulation: Both vertebral arteries are patent through the foramen magnum. There is atherosclerotic disease of both V4 segments. Stenosis on the left is estimated at 80%. Stenosis on the right is estimated at 30-50%. Both vessels reach the basilar. No basilar stenosis. Posterior circulation branch vessels are patent. Venous sinuses: Patent and normal. Anatomic variants: None significant. Delayed phase: No abnormal enhancement. Review of the MIP  images confirms the above findings IMPRESSION: 1. No cause of  acute neck pain and stiffness is identified. No acute vascular dissection. 2. Atherosclerotic disease at both carotid bifurcations. No stenosis on the right. 30% stenosis in the ICA bulb on the left. 3. Atherosclerotic disease in both carotid siphon regions with stenosis estimated at 50% on each side. 4. Atherosclerotic disease of both V4 segments. Stenosis on the left is estimated at 80%. Stenosis on the right is estimated at 30-50%. 5. No intracranial large or medium vessel occlusion or correctable proximal stenosis. 6. No soft tissue neck lesion of significance identified. Low-density thyroid cyst or nodule on the left unchanged since 2018 and quite likely benign. Chronic degenerative cervical spondylosis which could be a cause of neck pain. No evidence of severe canal stenosis. Ordinary mild foraminal narrowing. Electronically Signed   By: Nelson Chimes M.D.   On: 06/21/2018 15:34   Ct Angio Neck W And/or Wo Contrast  Result Date: 06/21/2018 CLINICAL DATA:  Bilateral neck pain and stiffness over the last 2 days. EXAM: CT ANGIOGRAPHY HEAD AND NECK TECHNIQUE: Multidetector CT imaging of the head and neck was performed using the standard protocol during bolus administration of intravenous contrast. Multiplanar CT image reconstructions and MIPs were obtained to evaluate the vascular anatomy. Carotid stenosis measurements (when applicable) are obtained utilizing NASCET criteria, using the distal internal carotid diameter as the denominator. CONTRAST:  64m ISOVUE-370 IOPAMIDOL (ISOVUE-370) INJECTION 76% COMPARISON:  None. FINDINGS: Head CT Brain: Mild chronic small-vessel ischemic change of the white matter. No sign of acute infarction, mass lesion, hemorrhage, hydrocephalus or extra-axial collection. Vascular: There is atherosclerotic calcification of the major vessels at the base of the brain. Skull: Normal. No traumatic finding. No focal bone lesion. Sinuses/Orbits: Sinuses are clear. Orbits appear normal.  Mastoids are clear. Other: None significant CTA NECK FINDINGS Aortic arch: Aortic atherosclerosis and mild ectasia. Ascending aorta measures up to 3.6 cm in diameter. Previous median sternotomy and CABG. Prominent pulmonary arteries. Branching pattern of the brachiocephalic vessels is normal without origin stenosis. Right carotid system: Innominate artery is ectatic. Right common carotid artery is widely patent to the bifurcation. There is atherosclerotic calcification at the carotid bifurcation and ICA bulb but no stenosis. Cervical ICA is mildly tortuous but widely patent without dissection. Left carotid system: Common carotid artery is widely patent to the bifurcation region. There is soft and calcified plaque at the carotid bifurcation and ICA bulb. Minimal diameter in the ICA bulb is 3.5 mm. Compared to a more distal cervical ICA diameter of 5 mm, this indicates a 30% stenosis. No sign of carotid dissection. Vertebral arteries: Subclavian arteries are mildly ectatic. Both vertebral artery origins are widely patent without soft or calcified plaque. Both vertebral arteries are widely patent through the cervical region to the foramen magnum. No evidence of vertebral dissection. Skeleton: Degenerative cervical spondylosis and facet osteoarthritis which could be painful. No gross compressive stenosis is evident. Other neck: No acute soft tissue finding. No evidence of mass or lymphadenopathy. No evidence of inflammatory disease. Thyroid nodule or cyst on the left measuring up to 2.3 cm in diameter. This is similar to a previous chest CT of October 2018 in therefore likely benign. Upper chest: Otherwise negative Review of the MIP images confirms the above findings CTA HEAD FINDINGS Anterior circulation: Both internal carotid arteries are patent through the skull base to the siphon regions. There is extensive atherosclerotic calcification in both carotid siphon regions with stenosis estimated at 50% on each side.  Supraclinoid internal carotid arteries are widely patent. The anterior and middle cerebral vessels are patent without proximal stenosis, aneurysm or vascular malformation. There may be a 30% stenosis of the M1 segment on the right. Posterior circulation: Both vertebral arteries are patent through the foramen magnum. There is atherosclerotic disease of both V4 segments. Stenosis on the left is estimated at 80%. Stenosis on the right is estimated at 30-50%. Both vessels reach the basilar. No basilar stenosis. Posterior circulation branch vessels are patent. Venous sinuses: Patent and normal. Anatomic variants: None significant. Delayed phase: No abnormal enhancement. Review of the MIP images confirms the above findings IMPRESSION: 1. No cause of acute neck pain and stiffness is identified. No acute vascular dissection. 2. Atherosclerotic disease at both carotid bifurcations. No stenosis on the right. 30% stenosis in the ICA bulb on the left. 3. Atherosclerotic disease in both carotid siphon regions with stenosis estimated at 50% on each side. 4. Atherosclerotic disease of both V4 segments. Stenosis on the left is estimated at 80%. Stenosis on the right is estimated at 30-50%. 5. No intracranial large or medium vessel occlusion or correctable proximal stenosis. 6. No soft tissue neck lesion of significance identified. Low-density thyroid cyst or nodule on the left unchanged since 2018 and quite likely benign. Chronic degenerative cervical spondylosis which could be a cause of neck pain. No evidence of severe canal stenosis. Ordinary mild foraminal narrowing. Electronically Signed   By: Nelson Chimes M.D.   On: 06/21/2018 15:34   Ir Radiologist Eval & Mgmt  Result Date: 07/08/2018 Please refer to notes tab for details about interventional procedure. (Op Note)  Ct Angio Abdomen Pelvis  W &/or Wo Contrast  Result Date: 07/08/2018 CLINICAL DATA:  Status post transcatheter embolization of type II endoleak after EVAR  supplied by the inferior mesenteric artery on 04/30/2018. EXAM: CT ANGIOGRAPHY ABDOMEN AND PELVIS WITH CONTRAST AND WITHOUT CONTRAST TECHNIQUE: Multidetector CT imaging of the abdomen and pelvis was performed using the standard protocol during bolus administration of intravenous contrast. Multiplanar reconstructed images and MIPs were obtained and reviewed to evaluate the vascular anatomy. CONTRAST:  33m ISOVUE-370 IOPAMIDOL (ISOVUE-370) INJECTION 76% COMPARISON:  03/22/2018 FINDINGS: VASCULAR Aorta: Coils are seen at the level of the anterior aortic sac extending into the IMA trunk after embolization. The aortic sac surrounding a patent endograft shows stable maximal diameter of approximately 8.4 x 8.8 cm. On delayed imaging, there is no further visualization of the prominent type II endoleak seen previously occupying the superior aspect of the aneurysm sac. No additional type II or type I endoleak identified. Celiac: Normally patent. SMA: Normally patent. Renals: 2 separate right and single left renal artery demonstrate normal patency. IMA: Collateral reconstitution distal branches via SMA supplied. The IMA trunk adjacent to the aortic sac contains embolization coils and is occluded. Inflow: Stable patency of bilateral iliac limbs of the endograft. Stable aneurysmal dilatation the distal left common iliac artery just beyond the iliac endograft limb measuring up to 2.6 cm. Stable aneurysmal dilatation of the left internal iliac artery trunk measuring 2 cm. Proximal Outflow: Common femoral arteries and femoral bifurcations are normally patent. Veins: No venous abnormalities on delayed imaging. Review of the MIP images confirms the above findings. NON-VASCULAR Lower chest: Stable bibasilar scarring. Hepatobiliary: No focal liver abnormality is seen. No gallstones, gallbladder wall thickening, or biliary dilatation. Pancreas: Unremarkable. No pancreatic ductal dilatation or surrounding inflammatory changes. Spleen:  Normal in size without focal abnormality. Adrenals/Urinary Tract: Adrenal glands are unremarkable. Kidneys are normal,  without renal calculi, focal lesion, or hydronephrosis. Bladder is unremarkable. Stomach/Bowel: Bowel shows no evidence of obstruction, ileus or inflammation. No free air. Lymphatic: No enlarged lymph nodes identified. Reproductive: Prostate is unremarkable. Other: Stable small bilateral inguinal hernias containing fat. No abnormal fluid collections. Musculoskeletal: Stable degenerative disc disease of the lumbar spine. IMPRESSION: 1. Resolution of type II endoleak by CTA after transcatheter embolization of the anterior aortic sac and IMA trunk. Overall aneurysm sac dimensions are stable and the sac shows no increase in size since the prior study. 2. 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 Electronically Signed   By: Aletta Edouard M.D.   On: 07/08/2018 14:20    Labs:  CBC: Recent Labs    04/30/18 0703 06/21/18 1212  WBC 7.9 8.6  HGB 12.7* 13.4  HCT 41.1 44.1  PLT 202 248    COAGS: Recent Labs    04/30/18 0703  INR 1.09    BMP: Recent Labs    04/30/18 0703 06/21/18 1212  NA 141 137  K 4.3 4.0  CL 104 100  CO2 27 26  GLUCOSE 132* 127*  BUN 19 15  CALCIUM 8.6* 8.9  CREATININE 1.13 1.24  GFRNONAA >60 56*  GFRAA >60 >60    Assessment and Plan:  I met with Mr. Spraggins and his daughter Sharyn Lull.  We reviewed the follow-up CTA performed earlier today which demonstrates resolution of the prominent type II endoleak after embolization visualized previously on the delayed phase of imaging in the superior aspect of the aneurysm sac.  This appears to have been supplied by the IMA.  The current study shows stable aneurysm sac size of approximately 8.4 x 8.8 cm and no evidence of additional or new endoleak.  I recommended another follow-up CTA in 6 to 12 months.  He is scheduled to follow-up with Dr. Trula Slade next  week.  He states that he will try the prescribed muscle relaxant for his neck pain to see if that will help.  Electronically Signed: Azzie Roup 07/08/2018, 2:59 PM     I spent a total of 15 Minutes in face to face in clinical consultation, greater than 50% of which was counseling/coordinating care post embolization of a type II aortic endoleak.

## 2018-07-12 ENCOUNTER — Encounter: Payer: Self-pay | Admitting: Surgery

## 2018-07-12 ENCOUNTER — Ambulatory Visit (INDEPENDENT_AMBULATORY_CARE_PROVIDER_SITE_OTHER): Payer: Medicare Other | Admitting: Surgery

## 2018-07-12 ENCOUNTER — Other Ambulatory Visit: Payer: Self-pay

## 2018-07-12 VITALS — BP 104/69 | HR 87 | Temp 97.1°F | Resp 18 | Ht 72.0 in | Wt 296.3 lb

## 2018-07-12 DIAGNOSIS — I714 Abdominal aortic aneurysm, without rupture, unspecified: Secondary | ICD-10-CM

## 2018-07-12 DIAGNOSIS — I6523 Occlusion and stenosis of bilateral carotid arteries: Secondary | ICD-10-CM | POA: Diagnosis not present

## 2018-07-12 NOTE — Progress Notes (Signed)
Vascular and Vein Specialist of [facility]  Patient name: Ryan Wise MRN: [id number] DOB: [birth year] Sex: male   REASON FOR VISIT:    Follow up  HISOTRY OF PRESENT ILLNESS:    Ryan Wise a 77y.o.malereturns today for follow-up. 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 His postoperative course was uncomplicated.  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 Fredia Sorrow.    He has a history of CAD, s/p CABG. He is followed by Dr. Rennis Golden. He is on a statin for hypercholesterolemia. He takes an ASA. He is medically managed for hypertension. He is a former smoker. He remains very active.  The patient was recently in the emergency department for severe neck pain.  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  His carotid arteries were essentially normal.  He is not having any neurologic symptoms.  PAST MEDICAL HISTORY:   Past Medical History:  Diagnosis Date  . Aneurysm of infrarenal abdominal aorta (HCC)    a. 01/2016 s/p endovascular repair of AAA (EVAR).  . Arthritis   . Chronic combined systolic and diastolic CHF (congestive heart failure) (HCC)    a. 01/2016 Echo: EF 35-40%, diff HK, mildly dil Ao root, mild MR, mildly dil LA.  Marland Kitchen Coronary artery disease    a. 1999 s/p CABG x4 (LIMA->LAD, VG->OM, VG->Diag, VG->RCA); b. 11/2012 Inferior STEMI/PCI: LIMA->LAD ok, VG->Diag 100, VG->OM 100, VG->RCA was culprit (PTCA/thrombectomy->Xience Xpedition DES), EF 25%.  Marland Kitchen GERD (gastroesophageal reflux disease)   . High triglycerides   . Hypertension   . Ischemic Cardiomyopathy    a. 01/2016 Echo: EF 35-40%, diff HK, inflat, inf AK.  . Obesity   . Persistent atrial fibrillation    a. Dx 01/2016-->slow vent  response on beta blocker;  b. CHA2DS2VASc = 6-->Eliquis;  c. 03/03/2016 unsuccessful DCCV.  . Ruptured lumbar disc   . ST elevation myocardial infarction (STEMI) of inferior wall (HCC) 12/18/12   STEMI of inf. wall w/PCI with Xperdition stent to VG to distal RCA  . Type II diabetes mellitus (HCC)      FAMILY HISTORY:   Family History  Problem Relation Age of Onset  . CAD Mother   . Heart disease Mother   . CAD Brother   . Heart attack Brother   . Hyperlipidemia Sister   . Leukemia Father   . CAD Maternal Grandmother   . Brain cancer Brother   . CAD Sister     SOCIAL HISTORY:   Social History   Tobacco Use  . Smoking status: Former Smoker    Packs/day: 4.50    Years: 30.00    Pack years: 135.00    Last attempt to quit: 05/07/1987    Years since quitting: 31.2  . Smokeless tobacco: Former Neurosurgeon    Types: Chew  Substance Use Topics  . Alcohol use: No     ALLERGIES:   Allergies  Allergen Reactions  . Other Other (See Comments)    Pain medication that he was given after open heart surgery-sweating and hallucinations (possibly percocet)  . Cholecalciferol [Vitamin D-3] Other (See Comments)    CAUSES BOILS   . Fish Oil Hives  . Aspirin Nausea And Vomiting    Can't take full strength uncoated aspirin  . Oxycodone Other (See Comments)  Hallucinations     CURRENT MEDICATIONS:   Current Outpatient Medications  Medication Sig Dispense Refill  . acetaminophen (TYLENOL) 500 MG tablet Take 500 mg by mouth every 6 (six) hours as needed for mild pain.    Marland Kitchen. albuterol (PROVENTIL HFA;VENTOLIN HFA) 108 (90 Base) MCG/ACT inhaler Inhale 1 puff into the lungs every 6 (six) hours as needed for wheezing or shortness of breath.    Marland Kitchen. apixaban (ELIQUIS) 5 MG TABS tablet Take 1 tablet (5 mg total) by mouth 2 (two) times daily. 180 tablet 1  . aspirin EC 81 MG EC tablet Take 1 tablet (81 mg total) by mouth daily.    Marland Kitchen. atorvastatin (LIPITOR) 80 MG tablet Take 1 tablet (80 mg total) by  mouth daily at 6 PM. 90 tablet 2  . carboxymethylcellulose (REFRESH PLUS) 0.5 % SOLN Place 1 drop into both eyes 3 (three) times daily as needed (DRY EYES).    Marland Kitchen. dofetilide (TIKOSYN) 500 MCG capsule Take 1 capsule (500 mcg total) by mouth 2 (two) times daily. 180 capsule 3  . Flaxseed, Linseed, (FLAX SEED OIL PO) Take 2 capsules by mouth 2 (two) times daily.    . furosemide (LASIX) 20 MG tablet TAKE 1 TABLET BY MOUTH EVERY DAY 90 tablet 3  . isosorbide mononitrate (IMDUR) 30 MG 24 hr tablet TAKE 1 TABLET BY MOUTH EVERY DAY 30 tablet 11  . KLOR-CON M20 20 MEQ tablet TAKE 1.5 TABLETS (30 MEQ TOTAL) BY MOUTH DAILY. 45 tablet 9  . lisinopril (PRINIVIL,ZESTRIL) 5 MG tablet TAKE 1 TABLET (5 MG TOTAL) BY MOUTH DAILY. 30 tablet 11  . LORazepam (ATIVAN) 0.5 MG tablet Take 0.5 mg by mouth 2 (two) times daily.     . Magnesium 400 MG CAPS Take 400 mg by mouth 2 (two) times daily. 60 capsule 5  . metFORMIN (GLUCOPHAGE-XR) 750 MG 24 hr tablet Take 1 tablet by mouth daily.  1  . methocarbamol (ROBAXIN) 500 MG tablet Take 1 tablet (500 mg total) by mouth 2 (two) times daily. 20 tablet 0  . nitroGLYCERIN (NITROSTAT) 0.4 MG SL tablet Place 1 tablet (0.4 mg total) under the tongue every 5 (five) minutes as needed for chest pain. 25 tablet 3  . ranitidine (ZANTAC) 150 MG tablet Take 150 mg by mouth 2 (two) times daily.     No current facility-administered medications for this visit.     REVIEW OF SYSTEMS:   [X]  denotes positive finding, [ ]  denotes negative finding Cardiac  Comments:  Chest pain or chest pressure:    Shortness of breath upon exertion:    Short of breath when lying flat:    Irregular heart rhythm:        Vascular    Pain in calf, thigh, or hip brought on by ambulation:    Pain in feet at night that wakes you up from your sleep:     Blood clot in your veins:    Leg swelling:         Pulmonary    Oxygen at home:    Productive cough:     Wheezing:         Neurologic    Sudden weakness  in arms or legs:     Sudden numbness in arms or legs:     Sudden onset of difficulty speaking or slurred speech:    Temporary loss of vision in one eye:     Problems with dizziness:         Gastrointestinal  Blood in stool:     Vomited blood:         Genitourinary    Burning when urinating:     Blood in urine:        Psychiatric    Major depression:         Hematologic    Bleeding problems:    Problems with blood clotting too easily:        Skin    Rashes or ulcers:        Constitutional    Fever or chills:      PHYSICAL EXAM:   Vitals:   07/12/18 1031 07/12/18 1036  BP: 115/72 104/69  Pulse: 87 87  Resp: 18   Temp: (!) 97.1 F (36.2 C)   TempSrc: Oral   SpO2: 94%   Weight: 296 lb 4.8 oz (134.4 kg)   Height: 6' (1.829 m)     GENERAL: The patient is a well-nourished male, in no acute distress. The vital signs are documented above. CARDIAC: There is a regular rate and rhythm.  PULMONARY: Non-labored respirations ABDOMEN: Soft and non-tender  MUSCULOSKELETAL: There are no major deformities or cyanosis. NEUROLOGIC: No focal weakness or paresthesias are detected. SKIN: There are no ulcers or rashes noted. PSYCHIATRIC: The patient has a normal affect.  STUDIES:   I have reviewed his CT angiogram of the following findings: 1. No cause of acute neck pain and stiffness is identified. No acute vascular dissection. 2. Atherosclerotic disease at both carotid bifurcations. No stenosis on the right. 30% stenosis in the ICA bulb on the left. 3. Atherosclerotic disease in both carotid siphon regions with stenosis estimated at 50% on each side. 4. Atherosclerotic disease of both V4 segments. Stenosis on the left is estimated at 80%. Stenosis on the right is estimated at 30-50%. 5. No intracranial large or medium vessel occlusion or correctable proximal stenosis. 6. No soft tissue neck lesion of significance identified. Low-density thyroid cyst or nodule on the left  unchanged since 2018 and quite likely benign. Chronic degenerative cervical spondylosis which could be a cause of neck pain. No evidence of severe canal stenosis. Ordinary mild foraminal narrowing. MEDICAL ISSUES:   AAA: Patient is scheduled to see Dr. Fredia Sorrow in 6-12 months with a CT scan.  I will plan on having him follow-up in 1 year.  Carotid stenosis: I will repeat his ultrasound in 1 year.  Vertebral stenosis: Medical management is recommended.  He does not appear to be symptomatic from this.  I feel his neck pain is most likely from arthritic and degenerative changes within the cervical spine.  He was encouraged to continue taking muscle relaxers and at this does not improve over the next week that he should contact a spine specialist.    Durene Cal, MD [facility] and Vein Specialists of [facility] [phone] Pager [phone]

## 2018-07-24 ENCOUNTER — Other Ambulatory Visit (HOSPITAL_COMMUNITY): Payer: Self-pay | Admitting: Internal Medicine

## 2018-08-11 ENCOUNTER — Telehealth: Payer: Self-pay

## 2018-08-11 NOTE — Telephone Encounter (Signed)
Attempted to contact pt to inform we recently received a letter from [facility] Patient Assistance foundation, stating he was approved for Eliquis from 08/04/18-05/26/19. Left message to call back.

## 2018-08-12 NOTE — Telephone Encounter (Signed)
Patient returned call. Please leave a detailed message.

## 2018-08-12 NOTE — Telephone Encounter (Signed)
Attempted to contact pt. Left a detail message stating pt has been approved for Eliquis patient assistance.

## 2018-08-17 NOTE — Telephone Encounter (Signed)
Pt informed that he was approved for pt assistance for Eliquis. Pt states he has already spoke with [employer] to inform that he has insurance now to cover cost of Eliquis.

## 2018-09-07 DIAGNOSIS — R6884 Jaw pain: Secondary | ICD-10-CM | POA: Diagnosis not present

## 2018-09-20 ENCOUNTER — Telehealth: Payer: Self-pay

## 2018-09-20 NOTE — Telephone Encounter (Signed)
Patient returned your call.  He said you can reach him for the next 5 mins before he is back to mowing his lawn.

## 2018-09-20 NOTE — Telephone Encounter (Signed)
Virtual Visit Pre-Appointment Phone Call  "(Name), I am calling you today to discuss your upcoming appointment. We are currently trying to limit exposure to the virus that causes COVID-19 by seeing patients at home rather than in the office."  1. "What is the BEST phone number to call the day of the visit?" - include this in appointment notes  2. "Do you have or have access to (through a family member/friend) a smartphone with video capability that we can use for your visit?" a. If yes - list this number in appt notes as "cell" (if different from BEST phone #) and list the appointment type as a VIDEO visit in appointment notes b. If no - list the appointment type as a PHONE visit in appointment notes  3. Confirm consent - "In the setting of the current Covid19 crisis, you are scheduled for a (phone or video) visit with your provider on (date) at (time).  Just as we do with many in-office visits, in order for you to participate in this visit, we must obtain consent.  If you'd like, I can send this to your mychart (if signed up) or email for you to review.  Otherwise, I can obtain your verbal consent now.  All virtual visits are billed to your insurance company just like a normal visit would be.  By agreeing to a virtual visit, we'd like you to understand that the technology does not allow for your provider to perform an examination, and thus may limit your provider's ability to fully assess your condition. If your provider identifies any concerns that need to be evaluated in person, we will make arrangements to do so.  Finally, though the technology is pretty good, we cannot assure that it will always work on either your or our end, and in the setting of a video visit, we may have to convert it to a phone-only visit.  In either situation, we cannot ensure that we have a secure connection.  Are you willing to proceed?" STAFF: Did the patient verbally acknowledge consent to telehealth visit? Document  YES/NO here: Yes  4. Advise patient to be prepared - "Two hours prior to your appointment, go ahead and check your blood pressure, pulse, oxygen saturation, and your weight (if you have the equipment to check those) and write them all down. When your visit starts, your provider will ask you for this information. If you have an Apple Watch or Kardia device, please plan to have heart rate information ready on the day of your appointment. Please have a pen and paper handy nearby the day of the visit as well."  5. Give patient instructions for MyChart download to smartphone OR [email or web address] as below if video visit (depending on what platform provider is using)  6. Inform patient they will receive a phone call 15 minutes prior to their appointment time (may be from unknown caller ID) so they should be prepared to answer    TELEPHONE CALL NOTE  Rodriquez Winkles has been deemed a candidate for a follow-up tele-health visit to limit community exposure during the Covid-19 pandemic. I spoke with the patient via phone to ensure availability of phone/video source, confirm preferred email & phone number, and discuss instructions and expectations.  I reminded Crosley Leet to be prepared with any vital sign and/or heart rhythm information that could potentially be obtained via home monitoring, at the time of his visit. I reminded Ilija Chezem to expect a phone call prior to his visit.  Parke PoissonAlisha N Chaka Jefferys, RN 09/20/2018 1:51 PM   INSTRUCTIONS FOR DOWNLOADING THE MYCHART APP TO SMARTPHONE  - The patient must first make sure to have activated MyChart and know their login information - If Apple, go to [employer] Store and type in MyChart in the search bar and download the app. If Android, ask patient to go to [employer] Play Store and type in ReserveMyChart in the search bar and download the app. The app is free but as with any other app downloads, their phone may require them to verify saved payment information or Apple/Android  password.  - The patient will need to then log into the app with their MyChart username and password, and select [facility] as their healthcare provider to link the account. When it is time for your visit, go to the MyChart app, find appointments, and click Begin Video Visit. Be sure to Select Allow for your device to access the Microphone and Camera for your visit. You will then be connected, and your provider will be with you shortly.  **If they have any issues connecting, or need assistance please contact MyChart service desk [phone])**  **If using a computer, in order to ensure the best quality for their visit they will need to use either of the following Internet Browsers: [employer] Edge, or Google Chrome**  IF USING DOXIMITY or DOXY.ME - The patient will receive a link just prior to their visit by text.     FULL LENGTH CONSENT FOR TELE-HEALTH VISIT   I hereby voluntarily request, consent and authorize [facility] and its employed or contracted physicians, physician assistants, nurse practitioners or other licensed health care professionals (the Practitioner), to provide me with telemedicine health care services (the "Services") as deemed necessary by the treating Practitioner. I acknowledge and consent to receive the Services by the Practitioner via telemedicine. I understand that the telemedicine visit will involve communicating with the Practitioner through live audiovisual communication technology and the disclosure of certain medical information by electronic transmission. I acknowledge that I have been given the opportunity to request an in-person assessment or other available alternative prior to the telemedicine visit and am voluntarily participating in the telemedicine visit.  I understand that I have the right to withhold or withdraw my consent to the use of telemedicine in the course of my care at any time, without affecting my right to future care or treatment,  and that the Practitioner or I may terminate the telemedicine visit at any time. I understand that I have the right to inspect all information obtained and/or recorded in the course of the telemedicine visit and may receive copies of available information for a reasonable fee.  I understand that some of the potential risks of receiving the Services via telemedicine include:  Marland Kitchen. Delay or interruption in medical evaluation due to technological equipment failure or disruption; . Information transmitted may not be sufficient (e.g. poor resolution of images) to allow for appropriate medical decision making by the Practitioner; and/or  . In rare instances, security protocols could fail, causing a breach of personal health information.  Furthermore, I acknowledge that it is my responsibility to provide information about my medical history, conditions and care that is complete and accurate to the best of my ability. I acknowledge that Practitioner's advice, recommendations, and/or decision may be based on factors not within their control, such as incomplete or inaccurate data provided by me or distortions of diagnostic images or specimens that may result from electronic transmissions. I understand that  the practice of medicine is not an exact science and that Practitioner makes no warranties or guarantees regarding treatment outcomes. I acknowledge that I will receive a copy of this consent concurrently upon execution via email to the email address I last provided but may also request a printed copy by calling the office of [facility].    I understand that my insurance will be billed for this visit.   I have read or had this consent read to me. . I understand the contents of this consent, which adequately explains the benefits and risks of the Services being provided via telemedicine.  . I have been provided ample opportunity to ask questions regarding this consent and the Services and have had my questions  answered to my satisfaction. . I give my informed consent for the services to be provided through the use of telemedicine in my medical care  By participating in this telemedicine visit I agree to the above.

## 2018-09-20 NOTE — Telephone Encounter (Signed)
New Message  Patient returning your phone call please call him back he's available now.

## 2018-09-20 NOTE — Telephone Encounter (Signed)
lmtcb

## 2018-09-21 ENCOUNTER — Telehealth: Payer: Self-pay | Admitting: Internal Medicine

## 2018-09-22 ENCOUNTER — Telehealth (INDEPENDENT_AMBULATORY_CARE_PROVIDER_SITE_OTHER): Payer: Medicare Other | Admitting: Internal Medicine

## 2018-09-22 VITALS — Ht 74.0 in | Wt 286.0 lb

## 2018-09-22 DIAGNOSIS — I5042 Chronic combined systolic (congestive) and diastolic (congestive) heart failure: Secondary | ICD-10-CM

## 2018-09-22 DIAGNOSIS — Z9889 Other specified postprocedural states: Secondary | ICD-10-CM

## 2018-09-22 DIAGNOSIS — E785 Hyperlipidemia, unspecified: Secondary | ICD-10-CM

## 2018-09-22 DIAGNOSIS — I6529 Occlusion and stenosis of unspecified carotid artery: Secondary | ICD-10-CM

## 2018-09-22 DIAGNOSIS — Z8679 Personal history of other diseases of the circulatory system: Secondary | ICD-10-CM

## 2018-09-22 DIAGNOSIS — I255 Ischemic cardiomyopathy: Secondary | ICD-10-CM

## 2018-09-22 DIAGNOSIS — Z5181 Encounter for therapeutic drug level monitoring: Secondary | ICD-10-CM

## 2018-09-22 DIAGNOSIS — I4819 Other persistent atrial fibrillation: Secondary | ICD-10-CM | POA: Diagnosis not present

## 2018-09-22 DIAGNOSIS — Z79899 Other long term (current) drug therapy: Secondary | ICD-10-CM

## 2018-09-22 NOTE — Patient Instructions (Signed)
Medication Instructions:  Your Physician recommend you continue on your current medication as directed.    If you need a refill on your cardiac medications before your next appointment, please call your pharmacy.   Lab work: None  Testing/Procedures: None  Follow-Up: At [facility], you and your health needs are our priority.  As part of our continuing mission to provide you with exceptional heart care, we have created designated Provider Care Teams.  These Care Teams include your primary Cardiologist (physician) and Advanced Practice Providers (APPs -  Physician Assistants and Nurse Practitioners) who all work together to provide you with the care you need, when you need it. You will need a follow up appointment in 6 months.  Please call our office 2 months in advance to schedule this appointment.  You may see Dr. Rennis Golden or one of the following Advanced Practice Providers on your designated Care Team: Azalee Course, [place] . Micah Flesher, PA-C

## 2018-09-22 NOTE — Progress Notes (Signed)
Virtual Visit via Video Note   This visit type was conducted due to national recommendations for restrictions regarding the COVID-19 Pandemic (e.g. social distancing) in an effort to limit this patient's exposure and mitigate transmission in our community.  Due to his co-morbid illnesses, this patient is at least at moderate risk for complications without adequate follow up.  This format is felt to be most appropriate for this patient at this time.  All issues noted in this document were discussed and addressed.  A limited physical exam was performed with this format.  Please refer to the patient's chart for his consent to telehealth for [facility].   Evaluation Performed:  Doximity video visit  Date:  09/22/2018   ID:  Ryan Wise, DOB [birth year], MRN [id number]  Patient Location:  [address] [place] [address]  Provider location:   [address] [place], [place] [address]  PCP:  Pearson Grippe, MD  Cardiologist:  No primary care provider on file. Electrophysiologist:  None   Chief Complaint:  No complaints  History of Present Illness:    Ryan Wise is a 78 y.o. male who presents via audio/video conferencing for a telehealth visit today.  Ryan Wise was seen today in follow-up.  He is a longstanding patient of mine with numerous medical problems including coronary artery disease status post CABG with ST elevation MI and stent to the vein graft to the PDA as well as occluded graft to OM and diagonal vessels and a patent LIMA to LAD.  His CABG was in 15 and he suffered inferior STEMI in 2014.  He also has chronic combined systolic and diastolic congestive heart failure with EF as low as 25% however more recently 35 to 40% and up to 45 to 50% by echo in September 2018.  Additionally has hypertension, dyslipidemia, persistent atrial fibrillation on Tikosyn and Eliquis.  He had a recent AAA and is status post EVAR with type II endoleak.  Over the past year he had repair of the  endoleak and has done well.  We have been working with him regarding his lipid profile which does show elevated triglycerides.  It was advised that he make dietary changes including flaxseed oil and adding increase fish intake.  We will need to reassess this as he may be a candidate to add Vascepa.  Symptomatically he is doing very well.  He denies any shortness of breath or chest pain.  Is very active and anxious to get back outside.  Mainly he only gets out of the house to go the grocery store and tries to avoid contact during the coronavirus pandemic.  The patient does not have symptoms concerning for COVID-19 infection (fever, chills, cough, or new SHORTNESS OF BREATH).    Prior CV studies:   The following studies were reviewed today:  Lab work Chart review  PMHx:  Past Medical History:  Diagnosis Date  . Aneurysm of infrarenal abdominal aorta (HCC)    a. 01/2016 s/p endovascular repair of AAA (EVAR).  . Arthritis   . Chronic combined systolic and diastolic CHF (congestive heart failure) (HCC)    a. 01/2016 Echo: EF 35-40%, diff HK, mildly dil Ao root, mild MR, mildly dil LA.  Marland Kitchen Coronary artery disease    a. 1999 s/p CABG x4 (LIMA->LAD, VG->OM, VG->Diag, VG->RCA); b. 11/2012 Inferior STEMI/PCI: LIMA->LAD ok, VG->Diag 100, VG->OM 100, VG->RCA was culprit (PTCA/thrombectomy->Xience Xpedition DES), EF 25%.  Marland Kitchen GERD (gastroesophageal reflux disease)   . High triglycerides   .  Hypertension   . Ischemic Cardiomyopathy    a. 01/2016 Echo: EF 35-40%, diff HK, inflat, inf AK.  . Obesity   . Persistent atrial fibrillation    a. Dx 01/2016-->slow vent response on beta blocker;  b. CHA2DS2VASc = 6-->Eliquis;  c. 03/03/2016 unsuccessful DCCV.  . Ruptured lumbar disc   . ST elevation myocardial infarction (STEMI) of inferior wall (HCC) 12/18/12   STEMI of inf. wall w/PCI with Xperdition stent to VG to distal RCA  . Type II diabetes mellitus (HCC)     Past Surgical History:  Procedure Laterality  Date  . BACK SURGERY     (5) back surgeries  . CARDIOVERSION N/A 03/03/2016   Procedure: CARDIOVERSION;  Surgeon: Chrystie Nose, MD;  Location[facility] ENDOSCOPY;  Service: Cardiovascular;  Laterality: N/A;  . CARDIOVERSION N/A 03/12/2016   Procedure: CARDIOVERSION;  Surgeon: Chilton Si, MD;  Location: [facility] ENDOSCOPY;  Service: Cardiovascular;  Laterality: N/A;  . CATARACT EXTRACTION, BILATERAL Right 07/10/2016   left eye 07/24/16  . CORONARY ARTERY BYPASS GRAFT  1999   (CAD) CABG was a 5-vessel bypass and had a questionable history of A fib. He underwent monitoring whick showed sinus bradycardia and PACs but no evidence of A fib.  . ENDOVASCULAR STENT INSERTION N/A 12/28/2015   Procedure: ENDOVASCULAR STENT GRAFT INSERTION;  Surgeon: Nada Libman, MD;  Location: [facility] OR;  Service: Vascular;  Laterality: N/A;  . IR ANGIOGRAM PELVIS SELECTIVE OR SUPRASELECTIVE  04/30/2018  . IR ANGIOGRAM SELECTIVE EACH ADDITIONAL VESSEL  04/30/2018  . IR ANGIOGRAM VISCERAL SELECTIVE  04/30/2018  . IR EMBO ARTERIAL NOT HEMORR HEMANG INC GUIDE ROADMAPPING  04/30/2018  . IR RADIOLOGIST EVAL & MGMT  03/24/2017  . IR RADIOLOGIST EVAL & MGMT  04/14/2018  . IR RADIOLOGIST EVAL & MGMT  07/08/2018  . IR US GUIDE VASC ACCESS RIGHT  04/30/2018  . LEFT HEART CATHETERIZATION WITH CORONARY/GRAFT ANGIOGRAM  12/18/2012   Procedure: LEFT HEART CATHETERIZATION WITH Isabel Caprice;  Surgeon: [name], MD;  Location: [facility] C[facility] LAB;  Service: Cardiovascular;;  . LUMBAR DISC SURGERY    . open heart surgery  03/1998  . TONSILLECTOMY      FAMHx:  Family History  Problem Relation Age of Onset  . CAD Mother   . Heart disease Mother   . CAD Brother   . Heart attack Brother   . Hyperlipidemia Sister   . Leukemia Father   . CAD Maternal Grandmother   . Brain cancer Brother   . CAD Sister     SOCHx:   reports that he quit smoking about 31 years ago. He has a 135.00 pack-year smoking history. He has quit using  smokeless tobacco.  His smokeless tobacco use included chew. He reports that he does not drink alcohol or use drugs.  ALLERGIES:  Allergies  Allergen Reactions  . Other Other (See Comments)    Pain medication that he was given after open heart surgery-sweating and hallucinations (possibly percocet)  . Cholecalciferol [Vitamin D-3] Other (See Comments)    CAUSES BOILS   . Fish Oil Hives  . Aspirin Nausea And Vomiting    Can't take full strength uncoated aspirin  . Oxycodone Other (See Comments)    Hallucinations    MEDS:  Current Meds  Medication Sig  . acetaminophen (TYLENOL) 500 MG tablet Take 500 mg by mouth every 6 (six) hours as needed for mild pain.  Marland Kitchen albuterol (PROVENTIL HFA;VENTOLIN HFA) 108 (90 Base) MCG/ACT inhaler Inhale 1 puff  into the lungs every 6 (six) hours as needed for wheezing or shortness of breath.  Marland Kitchen apixaban (ELIQUIS) 5 MG TABS tablet Take 1 tablet (5 mg total) by mouth 2 (two) times daily.  Marland Kitchen aspirin EC 81 MG EC tablet Take 1 tablet (81 mg total) by mouth daily.  Marland Kitchen atorvastatin (LIPITOR) 80 MG tablet Take 1 tablet (80 mg total) by mouth daily at 6 PM.  . carboxymethylcellulose (REFRESH PLUS) 0.5 % SOLN Place 1 drop into both eyes 3 (three) times daily as needed (DRY EYES).  Marland Kitchen dofetilide (TIKOSYN) 500 MCG capsule Take 1 capsule (500 mcg total) by mouth 2 (two) times daily.  . Flaxseed, Linseed, (FLAX SEED OIL PO) Take 2 capsules by mouth 2 (two) times daily.  . furosemide (LASIX) 20 MG tablet TAKE 1 TABLET BY MOUTH EVERY DAY  . isosorbide mononitrate (IMDUR) 30 MG 24 hr tablet TAKE 1 TABLET BY MOUTH EVERY DAY  . KLOR-CON M20 20 MEQ tablet TAKE 1.5 TABLETS (30 MEQ TOTAL) BY MOUTH DAILY.  Marland Kitchen lisinopril (PRINIVIL,ZESTRIL) 5 MG tablet TAKE 1 TABLET (5 MG TOTAL) BY MOUTH DAILY.  Marland Kitchen LORazepam (ATIVAN) 0.5 MG tablet Take 0.5 mg by mouth 2 (two) times daily.   . Magnesium 400 MG CAPS Take 400 mg by mouth 2 (two) times daily.  . metFORMIN (GLUCOPHAGE-XR) 750 MG 24 hr  tablet Take 375 tablets by mouth daily.   . methocarbamol (ROBAXIN) 500 MG tablet Take 1 tablet (500 mg total) by mouth 2 (two) times daily.  . nitroGLYCERIN (NITROSTAT) 0.4 MG SL tablet Place 1 tablet (0.4 mg total) under the tongue every 5 (five) minutes as needed for chest pain.  . ranitidine (ZANTAC) 150 MG tablet Take 150 mg by mouth 2 (two) times daily.     ROS: Pertinent items noted in HPI and remainder of comprehensive ROS otherwise negative.  Labs/Other Tests and Data Reviewed:    Recent Labs: 06/21/2018: BUN 15; Creatinine, Ser 1.24; Hemoglobin 13.4; Platelets 248; Potassium 4.0; Sodium 137   Recent Lipid Panel No results found for: CHOL, TRIG, HDL, CHOLHDL, LDLCALC, LDLDIRECT  Wt Readings from Last 3 Encounters:  09/22/18 286 lb (129.7 kg)  07/12/18 296 lb 4.8 oz (134.4 kg)  07/08/18 292 lb (132.5 kg)     Exam:    Vital Signs:  Ht  (1.88 m)   Wt 286 lb (129.7 kg)   BMI 36.72 kg/m    General appearance: alert, no distress and moderately obese Lungs: No audible wheezing or visual respiratory difficulty Abdomen: Moderately obese Extremities: extremities normal, atraumatic, no cyanosis or edema Skin: Pale skin Neurologic: Mental status: Alert, oriented, thought content appropriate Psych: Pleasant  ASSESSMENT & PLAN:    1. Persistent atrial fibrillation with slow ventricular response-CHADSVASC score of 5 on Eliquis 2. Chronic Tikosyn therapy 3. Recent AAA status post -EVAR with type II endoleak 4. Coronary artery disease status post CABG with ST elevation MI and stent placement to the vein graft to PDA, with occluded grafts to the OM and diagonal vessels and a patent LIMA to LAD 5. Ischemic cardiomyopathy EF 45% (improved to 45-50% by echo in 01/2017) 6. Hypertension 7. Dyslipidemia  Ryan Wise is doing well and is asymptomatic at this point.  Fortunately he finally had closure of his type II endoleak and seems to have a stable findings after endovascular  repair of AAA.  He does have intracranial atherosclerosis and some carotid stenosis.  He had neck pain but is more likely related to arthritis.  He is on chronic Tikosyn with controlled ventricular response of his A. fib.  He has had no bleeding problems on Eliquis.  He will need a repeat EKG at his next office visit to monitor QTC on Eliquis.  He denies any new chest pain.  His EF has improved up to 45 to 50% by echo in 2018.  Blood pressure has been well controlled.  His cholesterol is generally well controlled except for mildly elevated triglycerides.  He is trying to make some dietary changes to address this and we may consider adding Vascepa at his next office visit.  COVID-19 Education: The signs and symptoms of COVID-19 were discussed with the patient and how to seek care for testing (follow up with PCP or arrange E-visit).  The importance of social distancing was discussed today.  Patient Risk:   After full review of this patients clinical status, I feel that they are at least moderate risk at this time.  Time:   Today, I have spent 25 minutes with the patient with telehealth technology discussing A. fib, AAA, coronary artery disease, ischemic cardiomyopathy, hypertension and other medical problems.     Medication Adjustments/Labs and Tests Ordered: Current medicines are reviewed at length with the patient today.  Concerns regarding medicines are outlined above.   Tests Ordered: No orders of the defined types were placed in this encounter.   Medication Changes: No orders of the defined types were placed in this encounter.   Disposition:  in 6 month(s)  Chrystie NoseKenneth C. Yemaya Barnier, MD, [facility]pt[facility] AssocFACC, FACP  [place]  [facility]  Medical Director of the Advanced Lipid Disorders &  Cardiovascular Risk Reduction Clinic Diplomate of the American Board of Clinical Lipidology Attending Cardiologist  Direct Dial: [phone]  Fax: [phone]  Website:  [email or web address]  Chrystie NoseKenneth C Cymone Yeske, MD   09/22/2018 10:03 AM

## 2018-10-21 DIAGNOSIS — H35372 Puckering of macula, left eye: Secondary | ICD-10-CM | POA: Diagnosis not present

## 2018-10-21 DIAGNOSIS — H353131 Nonexudative age-related macular degeneration, bilateral, early dry stage: Secondary | ICD-10-CM | POA: Diagnosis not present

## 2018-10-21 DIAGNOSIS — Z961 Presence of intraocular lens: Secondary | ICD-10-CM | POA: Diagnosis not present

## 2018-10-21 DIAGNOSIS — H35371 Puckering of macula, right eye: Secondary | ICD-10-CM | POA: Diagnosis not present

## 2018-10-21 DIAGNOSIS — H18413 Arcus senilis, bilateral: Secondary | ICD-10-CM | POA: Diagnosis not present

## 2018-11-24 NOTE — Telephone Encounter (Signed)
Open n error °

## 2018-11-25 ENCOUNTER — Other Ambulatory Visit: Payer: Self-pay | Admitting: *Deleted

## 2018-11-25 NOTE — Patient Outreach (Signed)
[facility]) Care Management  11/25/2018  Fateh Kindle 08/12/1940 [id number]   Telephone Screen  Referral Date:  11/15/2018 Referral Source:  [facility] High Risk List Reason for Referral:   Assess Needs Insurance:  [employer]   Outreach Attempt: Outreach attempt #1 to patient for [facility] screening. No answer. RN Health Coach left HIPAA compliant voicemail message along with contact information.  Plan:  RN Health Coach will send unsuccessful outreach letter to patient.  RN Health Coach will make another outreach attempt to patient within 3-4 business days if no return call back from patient.  [place] [phone] [email or web address]

## 2018-11-29 ENCOUNTER — Other Ambulatory Visit: Payer: Self-pay | Admitting: *Deleted

## 2018-11-29 NOTE — Patient Outreach (Signed)
[facility]) Care Management  11/29/2018  Ryan Wise 1940/10/07 [id number]   Telephone Screen  Referral Date:  11/15/2018 Referral Source:  [facility] High Risk List Reason for Referral:   Assess Needs Insurance:  [employer]   Outreach Attempt:  Successful telephone outreach to patient to introduce [facility] services as part of [facility] insurance plan to assist with medical needs, education, and social needs, at no cost to the patient.  HIPAA verified with patient.  RN Health Coach introduced self, role, and [facility] services.  Patient declines screening and services at this time.  Agrees to allow Successful Outreach Letter with [facility] pamphlet.  Encouraged patient to contact [facility] in the future if interested in services.  Plan:  RN Health Coach will make patient inactive with [facility] due to patient declining services at this time.  [place] [phone] [email or web address]

## 2018-12-20 DIAGNOSIS — D649 Anemia, unspecified: Secondary | ICD-10-CM | POA: Diagnosis not present

## 2018-12-20 DIAGNOSIS — D638 Anemia in other chronic diseases classified elsewhere: Secondary | ICD-10-CM | POA: Diagnosis not present

## 2018-12-20 DIAGNOSIS — D509 Iron deficiency anemia, unspecified: Secondary | ICD-10-CM | POA: Diagnosis not present

## 2018-12-20 DIAGNOSIS — E785 Hyperlipidemia, unspecified: Secondary | ICD-10-CM | POA: Diagnosis not present

## 2018-12-20 DIAGNOSIS — Z0000 Encounter for general adult medical examination without abnormal findings: Secondary | ICD-10-CM | POA: Diagnosis not present

## 2018-12-20 DIAGNOSIS — Z79899 Other long term (current) drug therapy: Secondary | ICD-10-CM | POA: Diagnosis not present

## 2018-12-20 DIAGNOSIS — R739 Hyperglycemia, unspecified: Secondary | ICD-10-CM | POA: Diagnosis not present

## 2018-12-20 DIAGNOSIS — I10 Essential (primary) hypertension: Secondary | ICD-10-CM | POA: Diagnosis not present

## 2018-12-21 ENCOUNTER — Other Ambulatory Visit: Payer: Self-pay

## 2018-12-21 NOTE — Patient Outreach (Signed)
[facility]) Care Management  12/21/2018  Ryan Wise 05-02-1941 [id number]   Medication Adherence call to Ryan Wise Hipp[place] Identifiers Verify spoke with patient he is past due on Atorvastatin 80 mg patient explain he is only taking 1/2  tablet daily instead of 1 tablet daily patient is only taking 40 mg of Atorvastatin 80 mg. Ryan Wise is showing past due under [place].   [place] Management Direct Dial [phone]  Fax [phone] [email or web address]

## 2018-12-23 ENCOUNTER — Other Ambulatory Visit: Payer: Self-pay | Admitting: Internal Medicine

## 2018-12-27 DIAGNOSIS — R05 Cough: Secondary | ICD-10-CM | POA: Diagnosis not present

## 2018-12-27 DIAGNOSIS — E119 Type 2 diabetes mellitus without complications: Secondary | ICD-10-CM | POA: Diagnosis not present

## 2018-12-27 DIAGNOSIS — I10 Essential (primary) hypertension: Secondary | ICD-10-CM | POA: Diagnosis not present

## 2018-12-27 DIAGNOSIS — E538 Deficiency of other specified B group vitamins: Secondary | ICD-10-CM | POA: Diagnosis not present

## 2018-12-27 DIAGNOSIS — E785 Hyperlipidemia, unspecified: Secondary | ICD-10-CM | POA: Diagnosis not present

## 2018-12-27 DIAGNOSIS — D509 Iron deficiency anemia, unspecified: Secondary | ICD-10-CM | POA: Diagnosis not present

## 2018-12-27 DIAGNOSIS — Z0000 Encounter for general adult medical examination without abnormal findings: Secondary | ICD-10-CM | POA: Diagnosis not present

## 2019-01-07 ENCOUNTER — Other Ambulatory Visit: Payer: Self-pay | Admitting: Internal Medicine

## 2019-01-24 ENCOUNTER — Other Ambulatory Visit: Payer: Self-pay

## 2019-01-24 NOTE — Patient Outreach (Signed)
[facility]) Care Management  01/24/2019  [name] June 01, 1940 [id number]  Medication Adherence call to [name] Hippa Identifiers Verify spoke with patient he is past due on Atorvastatin 80 mg patient explain he is only taking 1/2 tablet daily but the prescription said to take 1 tablet daily patient is cutting in it in 1/2 he explain he has medication for about 3 more month patient is showing past due under [place].  [place] Management Direct Dial [phone]  Fax [phone] [email or web address]

## 2019-02-04 ENCOUNTER — Other Ambulatory Visit: Payer: Self-pay | Admitting: Internal Medicine

## 2019-03-22 DIAGNOSIS — E538 Deficiency of other specified B group vitamins: Secondary | ICD-10-CM | POA: Diagnosis not present

## 2019-03-22 DIAGNOSIS — E119 Type 2 diabetes mellitus without complications: Secondary | ICD-10-CM | POA: Diagnosis not present

## 2019-03-22 DIAGNOSIS — I10 Essential (primary) hypertension: Secondary | ICD-10-CM | POA: Diagnosis not present

## 2019-03-22 DIAGNOSIS — D509 Iron deficiency anemia, unspecified: Secondary | ICD-10-CM | POA: Diagnosis not present

## 2019-03-28 ENCOUNTER — Telehealth: Payer: Self-pay | Admitting: Internal Medicine

## 2019-03-28 NOTE — Telephone Encounter (Signed)
°  Daughter of patient wanted to know if she would be able to come with him to his appointment tomorrow.

## 2019-03-28 NOTE — Telephone Encounter (Signed)
Patient's daughter aware that she will not be able to come to visit on 11/3 d/t COVID restrictions

## 2019-03-28 NOTE — Telephone Encounter (Signed)
Pt wanted to speak to [place]. Will forward to nurse for review.

## 2019-03-29 ENCOUNTER — Other Ambulatory Visit: Payer: Self-pay

## 2019-03-29 ENCOUNTER — Encounter: Payer: Self-pay | Admitting: Internal Medicine

## 2019-03-29 ENCOUNTER — Ambulatory Visit (INDEPENDENT_AMBULATORY_CARE_PROVIDER_SITE_OTHER): Payer: Medicare Other | Admitting: Internal Medicine

## 2019-03-29 VITALS — BP 139/76 | HR 85 | Ht 73.0 in | Wt 300.0 lb

## 2019-03-29 DIAGNOSIS — I4891 Unspecified atrial fibrillation: Secondary | ICD-10-CM

## 2019-03-29 DIAGNOSIS — Z9889 Other specified postprocedural states: Secondary | ICD-10-CM

## 2019-03-29 DIAGNOSIS — Z5181 Encounter for therapeutic drug level monitoring: Secondary | ICD-10-CM | POA: Diagnosis not present

## 2019-03-29 DIAGNOSIS — Z23 Encounter for immunization: Secondary | ICD-10-CM | POA: Diagnosis not present

## 2019-03-29 DIAGNOSIS — E785 Hyperlipidemia, unspecified: Secondary | ICD-10-CM | POA: Diagnosis not present

## 2019-03-29 DIAGNOSIS — Z008 Encounter for other general examination: Secondary | ICD-10-CM

## 2019-03-29 DIAGNOSIS — L723 Sebaceous cyst: Secondary | ICD-10-CM | POA: Diagnosis not present

## 2019-03-29 DIAGNOSIS — E1121 Type 2 diabetes mellitus with diabetic nephropathy: Secondary | ICD-10-CM | POA: Diagnosis not present

## 2019-03-29 DIAGNOSIS — Z8679 Personal history of other diseases of the circulatory system: Secondary | ICD-10-CM

## 2019-03-29 DIAGNOSIS — D509 Iron deficiency anemia, unspecified: Secondary | ICD-10-CM | POA: Diagnosis not present

## 2019-03-29 DIAGNOSIS — Z79899 Other long term (current) drug therapy: Secondary | ICD-10-CM

## 2019-03-29 MED ORDER — DOFETILIDE 250 MCG PO CAPS: 250.0000 ug | ORAL_CAPSULE | Freq: Two times a day (BID) | ORAL | 3 refills | Status: DC

## 2019-03-29 NOTE — Patient Instructions (Signed)
Medication Instructions:  Your physician has recommended you make the following change in your medication:   Decrease your dofetilide ( Tikosyn) to 250 mg by mouth twice a day  *If you need a refill on your cardiac medications before your next appointment, please call your pharmacy*  Lab Work: none If you have labs (blood work) drawn today and your tests are completely normal, you will receive your results only by: Marland Kitchen MyChart Message (if you have MyChart) OR . A paper copy in the mail If you have any lab test that is abnormal or we need to change your treatment, we will call you to review the results.  Testing/Procedures: Your physician has requested that you have an electrocardiogram (EKG) in 2 weeks. An electrocardiogram (ECG or EKG) is a test that records the electrical impulses of the heart. It assesses many aspects of heart health, including:  Heart function.  Heart rhythm.  Heart muscle thickness. This test is simple, safe, and painless. It can be done as a routine part of a physical exam. It can also be done to evaluate symptoms such as severe chest pain and heart palpitations.    Follow-Up: At [facility], you and your health needs are our priority.  As part of our continuing mission to provide you with exceptional heart care, we have created designated Provider Care Teams.  These Care Teams include your primary Cardiologist (physician) and Advanced Practice Providers (APPs -  Physician Assistants and Nurse Practitioners) who all work together to provide you with the care you need, when you need it.  Your next appointment:   6 months  The format for your next appointment:   In Person  Provider:   K. Mali Hilty, MD

## 2019-03-29 NOTE — Progress Notes (Signed)
OFFICE NOTE  Chief Complaint:  No complaints  Primary Care Physician: Ryan Grippe, MD  HPI:  Ryan Wise is 78 y/o with a history of CAD, s/p CABG X 4 in 1999 with an LIMA-LAD, SVG-OM, SVG-Dx, SVG-RCA. He presented to [facility] 12/18/12 with a STEMI and was taken to the cath lab by Ryan Wise. This revealed the LIMA to the LAD to be patent with collaterals to the Dx. The SVG- OM: was occluded and the SVG- DX: was occluded The culprit appeared to be occlusion of SVG to distal RCA with no flow proximal due to anastomis occlusion with thrombus. He underwent a very difficult PCI with PTCA/ thrombectomy. He had no flow phenomenon requiring IC/IV NTG/IC verapamil/ angiomax, brilinta 180 mg, integrelin. He ultimately received a Xience Xpedition stent to distal anastomosis. He did have acute, transient CHF but stabilized with diuresis. He did well and was transferred to telemetry on 12/21/12.. Troponin was greater than 20. EF at cath was 35%. EF by echo was 35=40% with severe hypokinesis of the basal inferolateral myocardium; moderate hypokinesis of the basal-mid inferior and mid inferolateral myocardium; and mild hypokinesis of the basal-mid inferoseptal and apical septal myocardium. After he was transferred to telemetry he awaked early in the morning of the 30th with neck pain. He was transferred back to the ICU by the cardiology fellow on call. The pt was seen and examined by Ryan Wise the morning of the 29th. Ryan Wise did not feel his symptoms were cardiac. We added a NSAID and Skelaxin. He took this for a day or 2 he reported his neck pain improved significantly.  Today is here for followup and appears to be doing fairly well. He does report some shortness of breath doing moderate to more intense activities which he is trying to avoid. He did some lawn mowing which made her short of breath after a while. 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 He does feel that his energy level has  improved significantly and hopefully this means that his EF has come up as well. He continues to take naproxen on a daily basis, however it was intended that he only take this for a short period time for his neck pain. He did however discontinue Skelaxin. I reviewed recent laboratory work from his primary care provider today including a cholesterol profile. I was surprised to see how low that was, noting that his total cholesterol was 80, triglycerides 107 HDL 35 and LDL of 24. This is on atorvastatin 80 mg daily.  I saw Ryan Wise back in the office today. He continues to do very well. He is asymptomatic this had no significant weight gain or worsening shortness of breath. He denies any extremity edema. Blood pressure is very well-controlled today 122/60. He recently had laboratory work through his primary care provider which shows excellent cholesterol control. He does have a history of cardiomyopathy and is due for repeat echocardiogram.   Ryan Wise returns today for follow-up. Overall he is doing exceedingly well. He denies any worsening chest pain or shortness of breath. He's done well on his current medicines. Cholesterol is been well controlled. 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 Recent echocardiogram earlier this year shows an improvement in EF up to 45%.  01/31/2016  Ryan Wise returns today for follow-up. Unfortunately recently presented to the emergency department with abdominal pain, nausea and vomiting as well as diarrhea and was thought to have a  gastroenteritis. 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 Of note, he had an ultrasound of the abdomen in April 2016 which the radiologist commented that "the abdominal aorta is poorly seen due to overlying bowel gas". Subsequently he underwent EVAR which is complicated by a small type II endoleak. Since surgery Ryan Wise is felt somewhat fatigued and does get short of  breath with some minimal exertion. In the office today he was noted to be in atrial fibrillation with a slow ventricular response at 47. This is a new diagnosis for him. He is on low-dose metoprolol, given his history of cardiomyopathy. He only takes aspirin for his current anticoagulation. 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  02/14/2016  Ryan Wise returns today for follow-up. He has done well with discontinuing his beta blocker. Heart rate is now improved up to 58. He remains in atrial fibrillation. He does not feel quite as fatigued but does notice some fatigue. He seems to be tolerating Eliquis without any bleeding problems. I did review his recent endoleak with Ryan. Myra Wise who felt it was okay to start Eliquis in the setting of recent EVAR. At this point the next step would be to try to obtain a normal sinus rhythm. He will need to be anticoagulated for at least 3-4 weeks prior to cardioversion.  04/15/2016  Ryan Wise returns today for follow-up. He underwent outpatient cardioversion by myself which was unsuccessful. I therefore arranged for him to Wise on Tikosyn therapy. He was admitted and loaded on 500 g twice daily of Tikosyn and required cardioversion to get to sinus rhythm. This was successful and he has maintained sinus rhythm since then. He was discharged and followed up in the A. fib clinic. He was noted to have some hypokalemia and is due for repeat check of his potassium and magnesium. It was recommended that he have EKGs every 3 months and repeat of potassium and magnesium as per prescribing guidelines. He recently was seen in the emergency department for a "stabbing" mid upper back pain. This was felt to be reproducible and musculoskeletal. He did have a CT scan which ruled out dissection. He was given muscle relaxants and noted improvement with that and Tylenol.  07/31/2016  Ryan Wise returns today for follow-up. Overall he seems to be doing  well. EKG shows sinus rhythm today. QTC of 469 ms. He is on Tikosyn 500 g twice a day. He is potassium 2 days ago was 4.6 however magnesium was slightly low at 1.9. He is on magnesium oxide 400 mg daily. He denies any chest pain or worsening shortness of breath. There has been about a 6 pound weight gain however he reports has not been as active. He recently had bilateral cataract surgery for which she is recovering. He also saw Ryan. Myra Wise recently, who noted that he had a small endoleak from his AAA repair but he will continue to monitor.  02/16/2017  Mr. Mitzel returns today for follow-up. He seems to be doing really well. His echo recent showed improvement in LV function with an EF increased to 45-50%. He denies any recurrent atrial fibrillation. He appears to be in either a low atrial accelerated junctional rhythm today is 69. QTC is 467 ms. He is on dofetilide 500 g twice a day. He also takes Eliquis and denies any bleeding problems. He's scheduled for follow-up with Ryan. Myra Wise for an endoleak of his AAA repair. 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  09/02/2017  Mr. Neil Crouch was seen today in follow-up.  He denies any new symptoms.  He seems to be maintaining sinus on dofetilide.  His QTC today is 490 ms.  He had lab work in January which showed normal creatinine of 1.09.  Total cholesterol was 91 with LDL of 17.  Triglycerides were elevated to 21.  Hemoglobin A1c 6.5.  He continues to follow with Ryan. Myra Wise as well as interventional radiology for an endoleak of his prior AAA repair.  He is on apixaban for stroke risk reduction.  He denies chest pain or worsening shortness of breath.  03/29/2019  Mr. Neil Crouch returns today for follow-up.  He was last seen in April via virtual visit.  He was without complaints.  Eventually, interventional radiology was able to stop an endoleak of his aortic graft.   Unfortunately his developed some anemia and was noted to be iron deficient on recent labs with an iron level of 10.  Otherwise H&H was 12.5 and 40.  His creatinine was normal.  His liver enzymes were normal.  His hemoglobin A1c was 6.6 and stable.  Cholesterol showed total of 72, triglycerides 136, HDL 28 and LDL of 20.  Vitamin B12 number was normal at 866 however he was recently placed on repletion.  Overall he feels well.  An EKG was performed today to monitor his Tikosyn.  The QTC is prolonged today at 547 ms, up from 4 to 90 ms during his last EKG.  There have been no new medication changes.  He is on potassium and magnesium supplements.  He is maintaining sinus rhythm.  PMHx:  Past Medical History:  Diagnosis Date   Aneurysm of infrarenal abdominal aorta (HCC)    a. 01/2016 s/p endovascular repair of AAA (EVAR).   Arthritis    Chronic combined systolic and diastolic CHF (congestive heart failure) (HCC)    a. 01/2016 Echo: EF 35-40%, diff HK, mildly dil Ao root, mild MR, mildly dil LA.   Coronary artery disease    a. 1999 s/p CABG x4 (LIMA->LAD, VG->OM, VG->Diag, VG->RCA); b. 11/2012 Inferior STEMI/PCI: LIMA->LAD ok, VG->Diag 100, VG->OM 100, VG->RCA was culprit (PTCA/thrombectomy->Xience Xpedition DES), EF 25%.   GERD (gastroesophageal reflux disease)    High triglycerides    Hypertension    Ischemic Cardiomyopathy    a. 01/2016 Echo: EF 35-40%, diff HK, inflat, inf AK.   Obesity    Persistent atrial fibrillation (HCC)    a. Dx 01/2016-->slow vent response on beta blocker;  b. CHA2DS2VASc = 6-->Eliquis;  c. 03/03/2016 unsuccessful DCCV.   Ruptured lumbar disc    ST elevation myocardial infarction (STEMI) of inferior wall (HCC) 12/18/12   STEMI of inf. wall w/PCI with Xperdition stent to VG to distal RCA   Type II diabetes mellitus (HCC)     Past Surgical History:  Procedure Laterality Date   BACK SURGERY     (5) back surgeries   CARDIOVERSION N/A 03/03/2016   Procedure:  CARDIOVERSION;  Surgeon: Chrystie Nose, MD;  Location: [facility] ENDOSCOPY;  Service: Cardiovascular;  Laterality: N/A;   CARDIOVERSION N/A 03/12/2016   Procedure: CARDIOVERSION;  Surgeon: Chilton Si, MD;  Location: [facility] ENDOSCOPY;  Service: Cardiovascular;  Laterality: N/A;   CATARACT EXTRACTION, BILATERAL Right 07/10/2016   left eye 07/24/16   CORONARY ARTERY BYPASS GRAFT  1999   (CAD) CABG was a 5-vessel  bypass and had a questionable history of A fib. He underwent monitoring whick showed sinus bradycardia and PACs but no evidence of A fib.   ENDOVASCULAR STENT INSERTION N/A 12/28/2015   Procedure: ENDOVASCULAR STENT GRAFT INSERTION;  Surgeon: Nada LibmanVance W Brabham, MD;  Location: [facility] OR;  Service: Vascular;  Laterality: N/A;   IR ANGIOGRAM PELVIS SELECTIVE OR SUPRASELECTIVE  04/30/2018   IR ANGIOGRAM SELECTIVE EACH ADDITIONAL VESSEL  04/30/2018   IR ANGIOGRAM VISCERAL SELECTIVE  04/30/2018   IR EMBO ARTERIAL NOT HEMORR HEMANG INC GUIDE ROADMAPPING  04/30/2018   IR RADIOLOGIST EVAL & MGMT  03/24/2017   IR RADIOLOGIST EVAL & MGMT  04/14/2018   IR RADIOLOGIST EVAL & MGMT  07/08/2018   IR US GUIDE VASC ACCESS RIGHT  04/30/2018   LEFT HEART CATHETERIZATION WITH CORONARY/GRAFT ANGIOGRAM  12/18/2012   Procedure: LEFT HEART CATHETERIZATION WITH Isabel CapriceORONARY/GRAFT ANGIOGRAM;  Surgeon: Lennette Biharihomas A Kelly, MD;  [facility];  Service: Cardiovascular;;   LUMBAR DISC SURGERY     open heart surgery  03/1998   TONSILLECTOMY      FAMHx:  Family History  Problem Relation Age of Onset   CAD Mother    Heart disease Mother    CAD Brother    Heart attack Brother    Hyperlipidemia Sister    Leukemia Father    CAD Maternal Grandmother    Brain cancer Brother    CAD Sister     SOCHx:   reports that he quit smoking about 31 years ago. He has a 135.00 pack-year smoking history. He has quit using smokeless tobacco.  His smokeless tobacco use included chew. He reports that he does not drink  alcohol or use drugs.  ALLERGIES:  Allergies  Allergen Reactions   Other Other (See Comments)    Pain medication that he was given after open heart surgery-sweating and hallucinations (possibly percocet)   Cholecalciferol [Vitamin D-3] Other (See Comments)    CAUSES BOILS    Fish Oil Hives   Aspirin Nausea And Vomiting    Can't take full strength uncoated aspirin   Oxycodone Other (See Comments)    Hallucinations    ROS: Pertinent items noted in HPI and remainder of comprehensive ROS otherwise negative.  HOME MEDS: Current Outpatient Medications  Medication Sig Dispense Refill   acetaminophen (TYLENOL) 500 MG tablet Take 500 mg by mouth every 6 (six) hours as needed for mild pain.     albuterol (PROVENTIL HFA;VENTOLIN HFA) 108 (90 Base) MCG/ACT inhaler Inhale 1 puff into the lungs every 6 (six) hours as needed for wheezing or shortness of breath.     apixaban (ELIQUIS) 5 MG TABS tablet Take 1 tablet (5 mg total) by mouth 2 (two) times daily. 180 tablet 1   aspirin EC 81 MG EC tablet Take 1 tablet (81 mg total) by mouth daily.     atorvastatin (LIPITOR) 80 MG tablet Take 1 tablet (80 mg total) by mouth daily at 6 PM. 90 tablet 2   carboxymethylcellulose (REFRESH PLUS) 0.5 % SOLN Place 1 drop into both eyes 3 (three) times daily as needed (DRY EYES).     dofetilide (TIKOSYN) 500 MCG capsule Take 1 capsule (500 mcg total) by mouth 2 (two) times daily. 180 capsule 3   Flaxseed, Linseed, (FLAX SEED OIL PO) Take 2 capsules by mouth 2 (two) times daily.     furosemide (LASIX) 20 MG tablet TAKE 1 TABLET BY MOUTH EVERY DAY. PLEASE CALL TO SCHEDULE APPOINTMENT. 90 tablet 1  isosorbide mononitrate (IMDUR) 30 MG 24 hr tablet TAKE 1 TABLET BY MOUTH EVERY DAY 90 tablet 3   KLOR-CON M20 20 MEQ tablet TAKE 1.5 TABLETS (30 MEQ TOTAL) BY MOUTH DAILY. 135 tablet 3   lisinopril (ZESTRIL) 5 MG tablet TAKE 1 TABLET (5 MG TOTAL) BY MOUTH DAILY. 90 tablet 3   LORazepam (ATIVAN) 0.5 MG  tablet Take 0.5 mg by mouth 2 (two) times daily.      Magnesium 400 MG CAPS Take 400 mg by mouth 2 (two) times daily. 60 capsule 5   metFORMIN (GLUCOPHAGE-XR) 750 MG 24 hr tablet Take 375 tablets by mouth daily.   1   methocarbamol (ROBAXIN) 500 MG tablet Take 1 tablet (500 mg total) by mouth 2 (two) times daily. 20 tablet 0   nitroGLYCERIN (NITROSTAT) 0.4 MG SL tablet Place 1 tablet (0.4 mg total) under the tongue every 5 (five) minutes as needed for chest pain. 25 tablet 3   ranitidine (ZANTAC) 150 MG tablet Take 150 mg by mouth 2 (two) times daily.     No current facility-administered medications for this visit.     LABS/IMAGING: No results found for this or any previous visit (from the past 48 hour(s)). No results found.  VITALS: BP 139/76    Pulse 85    Ht  (1.854 m)    Wt 300 lb (136.1 kg)    SpO2 94%    BMI 39.58 kg/m   EXAM: General appearance: alert and no distress Neck: no carotid bruit, no JVD and thyroid not enlarged, symmetric, no tenderness/mass/nodules Lungs: clear to auscultation bilaterally Heart: regular rate and rhythm, S1, S2 normal, no murmur, click, rub or gallop Abdomen: soft, non-tender; bowel sounds normal; no masses,  no organomegaly Extremities: extremities normal, atraumatic, no cyanosis or edema Pulses: 2+ and symmetric Skin: Skin color, texture, turgor normal. No rashes or lesions Neurologic: Grossly normal Psych: Pleasant  EKG: Sinus rhythm first-degree block at 85, prolonged QTC at 547 ms-personally reviewed  ASSESSMENT: 1. New onset atrial fibrillation with slow ventricular response-CHADSVASC score of 5 2. Chronic Tikosyn therapy 3. Recent AAA status post -EVAR with type II endoleak 4. Coronary artery disease status post CABG with ST elevation MI and stent placement to the vein graft to PDA, with occluded grafts to the OM and diagonal vessels and a patent LIMA to LAD 5. Ischemic cardiac myopathy EF 45% (improved to 45-50% by echo in  01/2017) 6. Hypertension 7. Dyslipidemia  PLAN: 1.   Mr. Neil Crouch seems to be doing well without recurrent atrial fibrillation.  He is on chronic Tikosyn therapy but his QTC is prolonged today.  I would advise decreasing the dose to 250 mcg twice daily.  We will plan a repeat EKG in 2 weeks.  His endoleak is stopped and his hemoglobin is stable however his iron levels are low and he recently was advised to start on iron.  He denies any chest pain symptoms.  EF had improved and will need to be reassessed by echo next year likely.  Blood pressure is also controlled.  Cholesterol seems to be at goal with improvement in his triglycerides after dietary changes.  Plan follow-up with me in 6 months or sooner as necessary.  Chrystie Nose, MD, [facility], FACP  [place]   [facility]  Medical Director of the Advanced Lipid Disorders &  Cardiovascular Risk Reduction Clinic Diplomate of the American Board of Clinical Lipidology Attending Cardiologist  Direct Dial: [phone]   Fax: [phone]  Website:  [email or web address] 03/29/2019, 11:25 AM

## 2019-04-07 ENCOUNTER — Other Ambulatory Visit: Payer: Self-pay | Admitting: Surgery

## 2019-04-07 ENCOUNTER — Other Ambulatory Visit: Payer: Self-pay | Admitting: Interventional Radiology

## 2019-04-07 DIAGNOSIS — T82330A Leakage of aortic (bifurcation) graft (replacement), initial encounter: Secondary | ICD-10-CM

## 2019-04-07 DIAGNOSIS — IMO0002 Reserved for concepts with insufficient information to code with codable children: Secondary | ICD-10-CM

## 2019-04-12 ENCOUNTER — Other Ambulatory Visit: Payer: Self-pay

## 2019-04-12 ENCOUNTER — Ambulatory Visit (INDEPENDENT_AMBULATORY_CARE_PROVIDER_SITE_OTHER): Payer: Medicare Other

## 2019-04-12 ENCOUNTER — Telehealth: Payer: Self-pay

## 2019-04-12 VITALS — BP 135/82 | HR 80 | Ht 74.0 in | Wt 297.6 lb

## 2019-04-12 DIAGNOSIS — Z5181 Encounter for therapeutic drug level monitoring: Secondary | ICD-10-CM

## 2019-04-12 DIAGNOSIS — Z79899 Other long term (current) drug therapy: Secondary | ICD-10-CM | POA: Diagnosis not present

## 2019-04-12 NOTE — Telephone Encounter (Signed)
Pt presented for nurse visit today for EKG. EKG reviewed by DOD Dr. Loletha Grayer. Per Dr. Loletha Grayer, pt QT interval improved on decreased dose of Tikosyn.  Pt left office before receiving his AVS. Contacted pt and lmtcb.   Incoming call from operator transferred to triage nurse. Pt returning call. Advised that pt should continue on current medications and f/u with Dr. Debara Pickett in 6 months per his last AVS. Informed pt that AVS for nurse visit to be mailed to him. Pt verbalized understanding.  AVS mailed 11/17

## 2019-04-12 NOTE — Patient Instructions (Signed)
Medication Instructions:  Your physician recommends that you continue on your current medications as directed. Please refer to the Current Medication list given to you today.  *If you need a refill on your cardiac medications before your next appointment, please call your pharmacy*  Lab Work: none If you have labs (blood work) drawn today and your tests are completely normal, you will receive your results only by: Marland Kitchen MyChart Message (if you have MyChart) OR . A paper copy in the mail If you have any lab test that is abnormal or we need to change your treatment, we will call you to review the results.  Testing/Procedures: none  Follow-Up: At [facility], you and your health needs are our priority.  As part of our continuing mission to provide you with exceptional heart care, we have created designated Provider Care Teams.  These Care Teams include your primary Cardiologist (physician) and Advanced Practice Providers (APPs -  Physician Assistants and Nurse Practitioners) who all work together to provide you with the care you need, when you need it.  Your next appointment:   6 months  The format for your next appointment:   In Person  Provider:   You may see Dr. Debara Pickett or one of the following Advanced Practice Providers on your designated Care Team:    Almyra Deforest, PA-C  Fabian Sharp, [place] or   Roby Lofts, [place]   Other Instructions Your EKG for today's nurse visit was reviewed by our Doctor of the Day, Dr. Sallyanne Kuster. Per Dr. Sallyanne Kuster, your QT interval has improved on the decreased dose of Tikosyn. Please continue your current medications as prescribed and follow up with Dr. Debara Pickett in 6 months.

## 2019-04-29 ENCOUNTER — Other Ambulatory Visit: Payer: Self-pay | Admitting: Internal Medicine

## 2019-05-06 ENCOUNTER — Ambulatory Visit
Admission: RE | Admit: 2019-05-06 | Discharge: 2019-05-06 | Disposition: A | Discharge status: Home / Self Care | Payer: Medicare Other | Source: Ambulatory Visit | Attending: Surgery | Admitting: Surgery

## 2019-05-06 DIAGNOSIS — IMO0002 Reserved for concepts with insufficient information to code with codable children: Secondary | ICD-10-CM

## 2019-05-06 DIAGNOSIS — I714 Abdominal aortic aneurysm, without rupture: Secondary | ICD-10-CM | POA: Diagnosis not present

## 2019-05-06 DIAGNOSIS — T82330A Leakage of aortic (bifurcation) graft (replacement), initial encounter: Secondary | ICD-10-CM

## 2019-05-06 MED ORDER — IOPAMIDOL (ISOVUE-370) INJECTION 76%
75.0000 mL | Freq: Once | INTRAVENOUS | Status: AC | PRN
  Administered 2019-05-06: 75 mL via INTRAVENOUS

## 2019-05-11 ENCOUNTER — Encounter: Payer: Self-pay | Admitting: *Deleted

## 2019-05-11 ENCOUNTER — Ambulatory Visit
Admission: RE | Admit: 2019-05-11 | Discharge: 2019-05-11 | Disposition: A | Discharge status: Home / Self Care | Payer: Medicare Other | Source: Ambulatory Visit | Attending: Interventional Radiology | Admitting: Interventional Radiology

## 2019-05-11 DIAGNOSIS — I714 Abdominal aortic aneurysm, without rupture: Secondary | ICD-10-CM | POA: Diagnosis not present

## 2019-05-11 DIAGNOSIS — T82330A Leakage of aortic (bifurcation) graft (replacement), initial encounter: Secondary | ICD-10-CM

## 2019-05-11 DIAGNOSIS — IMO0002 Reserved for concepts with insufficient information to code with codable children: Secondary | ICD-10-CM

## 2019-05-11 HISTORY — PX: IR RADIOLOGIST EVAL & MGMT: IMG5224

## 2019-05-11 NOTE — Progress Notes (Signed)
Chief Complaint: Patient was consulted remotely today (TeleHealth) for follow up of aortic endoleak.  History of Present Illness: Ryan Wise is a 78 y.o. 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  The procedure was well-tolerated.  Postprocedural CTA on 07/08/2018 demonstrated resolution of any visible endoleak by CTA with stable aneurysm sac size.  Neck pain has improved significantly since February with rest and conservative measures.  Ryan Wise denies any abdominal pain, pelvic pain or claudication symptoms.  Past Medical History:  Diagnosis Date  . Aneurysm of infrarenal abdominal aorta (HCC)    a. 01/2016 s/p endovascular repair of AAA (EVAR).  . Arthritis   . Chronic combined systolic and diastolic CHF (congestive heart failure) (HCC)    a. 01/2016 Echo: EF 35-40%, diff HK, mildly dil Ao root, mild MR, mildly dil LA.  Marland Kitchen Coronary artery disease    a. 1999 s/p CABG x4 (LIMA->LAD, VG->OM, VG->Diag, VG->RCA); b. 11/2012 Inferior STEMI/PCI: LIMA->LAD ok, VG->Diag 100, VG->OM 100, VG->RCA was culprit (PTCA/thrombectomy->Xience Xpedition DES), EF 25%.  Marland Kitchen GERD (gastroesophageal reflux disease)   . High triglycerides   . Hypertension   . Ischemic Cardiomyopathy    a. 01/2016 Echo: EF 35-40%, diff HK, inflat, inf AK.  . Obesity   . Persistent atrial fibrillation (HCC)    a. Dx 01/2016-->slow vent response on beta blocker;  b. CHA2DS2VASc = 6-->Eliquis;  c. 03/03/2016 unsuccessful DCCV.  . Ruptured lumbar disc   . ST elevation myocardial infarction (STEMI) of inferior wall (HCC) 12/18/12   STEMI of inf. wall w/PCI with Xperdition stent to VG to distal RCA  . Type II diabetes mellitus (HCC)     Past Surgical History:  Procedure Laterality Date  . BACK SURGERY     (5) back surgeries  . CARDIOVERSION N/A 03/03/2016   Procedure: CARDIOVERSION;  Surgeon: Chrystie Nose, MD;  Location: [facility] ENDOSCOPY;   Service: Cardiovascular;  Laterality: N/A;  . CARDIOVERSION N/A 03/12/2016   Procedure: CARDIOVERSION;  Surgeon: Chilton Si, MD;  Location[facility] END[facility];  Service: Cardiovascular;  Laterality: N/A;  . CATARACT EXTRACTION, BILATERAL Right 07/10/2016   left eye 07/24/16  . CORONARY ARTERY BYPASS GRAFT  1999   (CAD) CABG was a 5-vessel bypass and had a questionable history of A fib. He underwent monitoring whick showed sinus bradycardia and PACs but no evidence of A fib.  . ENDOVASCULAR STENT INSERTION N/A 12/28/2015   Procedure: ENDOVASCULAR STENT GRAFT INSERTION;  Surgeon: Nada Libman, MD;  Location: [facility] OR;  Service: Vascular;  Laterality: N/A;  . IR ANGIOGRAM PELVIS SELECTIVE OR SUPRASELECTIVE  04/30/2018  . IR ANGIOGRAM SELECTIVE EACH ADDITIONAL VESSEL  04/30/2018  . IR ANGIOGRAM VISCERAL SELECTIVE  04/30/2018  . IR EMBO ARTERIAL NOT HEMORR HEMANG INC GUIDE ROADMAPPING  04/30/2018  . IR RADIOLOGIST EVAL & MGMT  03/24/2017  . IR RADIOLOGIST EVAL & MGMT  04/14/2018  . IR RADIOLOGIST EVAL & MGMT  07/08/2018  . IR US GUIDE VASC ACCESS RIGHT  04/30/2018  . LEFT HEART CATHETERIZATION WITH CORONARY/GRAFT ANGIOGRAM  12/18/2012   Procedure: LEFT HEART CATHETERIZATION WITH Isabel Caprice;  Surgeon: [name], MD;  Location: [facility] CATH LAB;  Service: Cardiovascular;;  . LUMBAR DISC SURGERY    . open heart surgery  03/1998  . TONSILLECTOMY      Allergies: Other, Cholecalciferol [vitamin d-3], Fish oil, Aspirin, and Oxycodone  Medications: Prior to Admission medications   Medication Sig  Start Date End Date Taking? Authorizing Provider  acetaminophen (TYLENOL) 500 MG tablet Take 500 mg by mouth every 6 (six) hours as needed for mild pain.    [provider]  albuterol (PROVENTIL HFA;VENTOLIN HFA) 108 (90 Base) MCG/ACT inhaler Inhale 1 puff into the lungs every 6 (six) hours as needed for wheezing or shortness of breath.    [provider]  apixaban (ELIQUIS) 5 MG TABS  tablet Take 1 tablet (5 mg total) by mouth 2 (two) times daily. 05/31/18   Chrystie Nose, MD  aspirin EC 81 MG EC tablet Take 1 tablet (81 mg total) by mouth daily. 12/22/12   Abelino Derrick, PA-C  atorvastatin (LIPITOR) 80 MG tablet TAKE 1 TABLET (80 MG TOTAL) BY MOUTH DAILY AT 6 PM. 04/29/19   Hilty, Lisette Abu, MD  carboxymethylcellulose (REFRESH PLUS) 0.5 % SOLN Place 1 drop into both eyes 3 (three) times daily as needed (DRY EYES).    [provider]  dofetilide (TIKOSYN) 250 MCG capsule Take 1 capsule (250 mcg total) by mouth 2 (two) times daily. 03/29/19   Hilty, Lisette Abu, MD  Flaxseed, Linseed, (FLAX SEED OIL PO) Take 2 capsules by mouth 2 (two) times daily.    [provider]  furosemide (LASIX) 20 MG tablet TAKE 1 TABLET BY MOUTH EVERY DAY. PLEASE CALL TO SCHEDULE APPOINTMENT. 02/04/19   Chrystie Nose, MD  isosorbide mononitrate (IMDUR) 30 MG 24 hr tablet TAKE 1 TABLET BY MOUTH EVERY DAY 12/23/18   Hilty, Lisette Abu, MD  KLOR-CON M20 20 MEQ tablet TAKE 1.5 TABLETS (30 MEQ TOTAL) BY MOUTH DAILY. 07/27/18   Hilty, Lisette Abu, MD  lisinopril (ZESTRIL) 5 MG tablet TAKE 1 TABLET (5 MG TOTAL) BY MOUTH DAILY. 12/23/18   Hilty, Lisette Abu, MD  LORazepam (ATIVAN) 0.5 MG tablet Take 0.5 mg by mouth 2 (two) times daily.  01/07/13   Chrystie Nose, MD  Magnesium 400 MG CAPS Take 400 mg by mouth 2 (two) times daily. 07/31/16   Hilty, Lisette Abu, MD  metFORMIN (GLUCOPHAGE-XR) 750 MG 24 hr tablet Take 375 tablets by mouth daily.  11/23/15   [provider]  methocarbamol (ROBAXIN) 500 MG tablet Take 1 tablet (500 mg total) by mouth 2 (two) times daily. 06/21/18   Hedges, Tinnie Gens, PA-C  nitroGLYCERIN (NITROSTAT) 0.4 MG SL tablet Place 1 tablet (0.4 mg total) under the tongue every 5 (five) minutes as needed for chest pain. 12/22/12   Abelino Derrick, PA-C  ranitidine (ZANTAC) 150 MG tablet Take 150 mg by mouth 2 (two) times daily.    [provider]     Family History  Problem  Relation Age of Onset  . CAD Mother   . Heart disease Mother   . CAD Brother   . Heart attack Brother   . Hyperlipidemia Sister   . Leukemia Father   . CAD Maternal Grandmother   . Brain cancer Brother   . CAD Sister     Social History   Socioeconomic History  . Marital status: Divorced    Spouse name: Not on file  . Number of children: 1  . Years of education: Not on file  . Highest education level: Not on file  Occupational History  . Occupation: Geophysicist/field seismologist    Comment: employer = self  Tobacco Use  . Smoking status: Former Smoker    Packs/day: 4.50    Years: 30.00    Pack years: 135.00    Quit  date: 05/07/1987    Years since quitting: 32.0  . Smokeless tobacco: Former NeurosurgeonUser    Types: Chew  Substance and Sexual Activity  . Alcohol use: No  . Drug use: No  . Sexual activity: Not on file  Other Topics Concern  . Not on file  Social History Narrative  . Not on file   Social Determinants of Health   Financial Resource Strain:   . Difficulty of Paying Living Expenses: Not on file  Food Insecurity:   . Worried About Programme researcher, broadcasting/film/videounning Out of Food in the Last Year: Not on file  . Ran Out of Food in the Last Year: Not on file  Transportation Needs:   . Lack of Transportation (Medical): Not on file  . Lack of Transportation (Non-Medical): Not on file  Physical Activity:   . Days of Exercise per Week: Not on file  . Minutes of Exercise per Session: Not on file  Stress:   . Feeling of Stress : Not on file  Social Connections:   . Frequency of Communication with Friends and Family: Not on file  . Frequency of Social Gatherings with Friends and Family: Not on file  . Attends Religious Services: Not on file  . Active Member of Clubs or Organizations: Not on file  . Attends BankerClub or Organization Meetings: Not on file  . Marital Status: Not on file    Review of Systems  Constitutional: Negative.   Respiratory: Negative.   Cardiovascular: Negative.   Gastrointestinal:  Negative.   Genitourinary: Negative.   Musculoskeletal: Positive for neck pain. Negative for arthralgias, back pain and gait problem.  Neurological: Negative.     Review of Systems: A 12 point ROS discussed and pertinent positives are indicated in the HPI above.  All other systems are negative.  Physical Exam No direct physical exam was performed (except for noted visual exam findings with Video Visits).   Vital Signs: There were no vitals taken for this visit.  Imaging: CT Angio Abd/Pel w/ and/or w/o  Result Date: 05/06/2019 CLINICAL DATA:  78 year old with endovascular repair of an abdominal aortic aneurysm. History of type 2 endoleak and history of endoleak treatment. EXAM: CT ANGIOGRAPHY ABDOMEN AND PELVIS WITH CONTRAST AND WITHOUT CONTRAST TECHNIQUE: Multidetector CT imaging of the abdomen and pelvis was performed using the standard protocol during bolus administration of intravenous contrast. Multiplanar reconstructed images and MIPs were obtained and reviewed to evaluate the vascular anatomy. CONTRAST:  75mL ISOVUE-370 IOPAMIDOL (ISOVUE-370) INJECTION 76% COMPARISON:  07/08/2018 FINDINGS: VASCULAR Aorta: Infrarenal aortic stent graft is patent. Limbs extend into the common iliac arteries bilaterally. Again noted are embolization coils in the anterior aspect of the sac and origin of the inferior mesenteric artery. Aneurysm sac on sequence 9, image 138 measures 9.3 x 8.6 cm and previously measured 9.0 x 8.4 cm on re-measurement. Findings suggests that the aneurysm sac has slightly enlarged in size. There is no clear evidence for an endoleak source. No inflammatory changes or stranding around the aneurysm sac. Celiac: Patent without evidence of aneurysm, dissection, vasculitis or significant stenosis. SMA: Patent without evidence of aneurysm, dissection, vasculitis or significant stenosis. Renals: Stable position of the stent graft below the renal arteries. The main left renal artery is patent  without aneurysm or stenosis. There are at least 2 right renal arteries. Difficult to evaluate for stenosis involving the right renal arteries. IMA: Proximal aspect of the IMA has been occluded with embolization coils. There is flow within the distal IMA. Inflow: Stent grafts  extend into the common iliac arteries bilaterally. The right internal and right external iliac arteries are patent. 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 Again noted is aneurysm of the proximal left internal iliac artery that measures 2.3 cm and stable. Left external iliac artery is patent. Proximal Outflow: Proximal femoral arteries are patent bilaterally. Veins: Portal venous system is patent. No gross abnormality to the IVC or renal veins. Review of the MIP images confirms the above findings. NON-VASCULAR Lower chest: Patchy densities at lung bases are most compatible scarring and atelectasis. Stable nodular density in the lingula measures roughly 6 mm on sequence 8, image 17 and not significantly changed since 03/22/2018. There is also a stable small pleural-based nodular density in the right middle lobe on image 14. Hepatobiliary: Normal appearance of the liver and gallbladder. No biliary dilatation. Pancreas: Unremarkable. No pancreatic ductal dilatation or surrounding inflammatory changes. Spleen: Normal in size without focal abnormality. Adrenals/Urinary Tract: Normal appearance of the adrenal glands. No hydronephrosis. No suspicious renal lesions. Normal appearance of the urinary bladder. Stomach/Bowel: Stomach is within normal limits. Appendix appears normal. No evidence of bowel wall thickening, distention, or inflammatory changes. Lymphatic: Prominent precaval lymph node measures 1.2 cm in the short axis and stable. No significant abdominal or pelvic lymph node enlargement. Reproductive: Calcifications in the prostate. Other: No ascites. Musculoskeletal:  Multilevel disc space narrowing and disease in the thoracic and lumbar spine. IMPRESSION: VASCULAR 1. Endovascular repair of the abdominal aortic aneurysm. The aneurysm sac has slightly enlarged in size since 07/08/2018 but no clear source for an endoleak. Aortic stent graft is patent. 2. 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 NON-VASCULAR 1. No acute abnormality in the abdomen or pelvis. Electronically Signed   By: Markus Daft M.D.   On: 05/06/2019 13:31    Labs:  CBC: Recent Labs    06/21/18 1212  WBC 8.6  HGB 13.4  HCT 44.1  PLT 248    COAGS: No results for input(s): INR, APTT in the last 8760 hours.  BMP: Recent Labs    06/21/18 1212  NA 137  K 4.0  CL 100  CO2 26  GLUCOSE 127*  BUN 15  CALCIUM 8.9  CREATININE 1.24  GFRNONAA 56*  GFRAA >60     Assessment and Plan:  I spoke with Ryan Wise and his daughter Ryan Wise by phone.  I reviewed the recent CTA study performed on 05/06/2019.  By my measurements, the aneurysm sac size is approximately 8.6 x 9.0 cm which appears to be very slightly increased from 8.4 x 8.8 cm on the 07/08/2018 study.  There is clearly no evidence of a discernible endoleak on the current CTA.  I recommended that we continue to follow aneurysm sac size by CTA as a subtle type II endoleak from lumbar supply cannot be entirely excluded after prior successful occlusion of IMA supply.  I recommended a follow-up CTA in 1 year.  He will contact myself or Dr. Trula Slade should he develop any abdominal symptoms in the interval.  He also is scheduled to follow-up with Dr. Trula Slade early next year.   Electronically Signed: Azzie Roup 05/11/2019, 9:07 AM     I spent a total of  15 Minutes in remote  clinical consultation, greater than 50% of which was counseling/coordinating care post endoleak embolization.    Visit type: Audio only (telephone). Audio (no video) only due to patient's  lack of  internet/smartphone capability. Alternative for in-person consultation at [facility], [address] [place], [place], [place]. This visit type was conducted due to national recommendations for restrictions regarding the COVID-19 Pandemic (e.g. social distancing).  This format is felt to be most appropriate for this patient at this time.  All issues noted in this document were discussed and addressed.

## 2019-06-17 ENCOUNTER — Telehealth: Payer: Self-pay | Admitting: Internal Medicine

## 2019-06-17 NOTE — Telephone Encounter (Signed)
Spoke with patient's daughter Marcelino Duster. Patient is under a lot of stress, having anxiety as his girlfriend of 31 years has advancing Alzheimer's and he is her primary caregiver at night. The girlfriend's daughter is POA and is moving her to a nursing home where only the primary caregiver can visit, which will not be Mr. Rosiles. Patient is very worried about her going to the nursing home and passing away soon after and not being able to see her. Marcelino Duster said he has been to her house upset, crying twice this week. She fears that he will be without purpose if his girlfriend passes away.   Patient/Michelle would like Dr. Rennis Golden to advise if he thinks that with his cardiac conditions, current medications if a dose increase in lorazepam would be OK/safe. He is currently taking 0.5mg  BID as prescribed by Dr. Selena Batten and each dose keeps his nerves calm/anxiety controlled about 3 hours. They would request a refill from PCP but wanted Dr. Blanchie Dessert input.   Advised will route message to Dr. Rennis Golden to review/advise and notify her

## 2019-06-17 NOTE — Telephone Encounter (Signed)
Patient's daughter, Marcelino Duster, is returning call. Please call back.

## 2019-06-17 NOTE — Telephone Encounter (Signed)
Daughter call. Addressed in another telephone note

## 2019-06-20 NOTE — Telephone Encounter (Signed)
Would probably be ok to increase Xanax - but should consult with Dr. Selena Batten on this.  Dr Rexene Edison

## 2019-06-20 NOTE — Telephone Encounter (Addendum)
Daughter notified via MyChart message  "Hi [name] -   Dr. Rennis Golden said it would probably be OK to increase the dose of Mr. Ingwersen anxiety medication but he should consult with Dr. Selena Batten on this to be certain.   -- Eileen Stanford RN"

## 2019-06-21 NOTE — Telephone Encounter (Signed)
Ok from a cardiac standpoint to adjust anxiety meds as directed by Dr. Selena Batten.  Dr. Rexene Edison

## 2019-06-21 NOTE — Telephone Encounter (Signed)
Letter composed, will be faxed

## 2019-06-22 ENCOUNTER — Other Ambulatory Visit: Payer: Self-pay | Admitting: Internal Medicine

## 2019-06-26 ENCOUNTER — Other Ambulatory Visit: Payer: Self-pay | Admitting: Internal Medicine

## 2019-06-27 DIAGNOSIS — E785 Hyperlipidemia, unspecified: Secondary | ICD-10-CM | POA: Diagnosis not present

## 2019-06-27 DIAGNOSIS — D509 Iron deficiency anemia, unspecified: Secondary | ICD-10-CM | POA: Diagnosis not present

## 2019-06-27 DIAGNOSIS — E1121 Type 2 diabetes mellitus with diabetic nephropathy: Secondary | ICD-10-CM | POA: Diagnosis not present

## 2019-07-04 DIAGNOSIS — E119 Type 2 diabetes mellitus without complications: Secondary | ICD-10-CM | POA: Diagnosis not present

## 2019-07-04 DIAGNOSIS — R040 Epistaxis: Secondary | ICD-10-CM | POA: Diagnosis not present

## 2019-07-04 DIAGNOSIS — R05 Cough: Secondary | ICD-10-CM | POA: Diagnosis not present

## 2019-07-04 DIAGNOSIS — J019 Acute sinusitis, unspecified: Secondary | ICD-10-CM | POA: Diagnosis not present

## 2019-07-04 DIAGNOSIS — D509 Iron deficiency anemia, unspecified: Secondary | ICD-10-CM | POA: Diagnosis not present

## 2019-07-14 ENCOUNTER — Other Ambulatory Visit (HOSPITAL_COMMUNITY): Payer: Self-pay | Admitting: Internal Medicine

## 2019-08-05 ENCOUNTER — Other Ambulatory Visit: Payer: Self-pay

## 2019-08-05 NOTE — Patient Outreach (Signed)
[facility]) Care Management  08/05/2019  Ryan Wise 09/26/1940 [id number]   Medication Adherence call to Ryan Wise HIPPA Compliant Voice message left with a call back number. Ryan Wise is showing past due on Atorvastatin 80 mg under [employer].   Lillia Abed CPhT Pharmacy Technician [facility] Management Direct Dial [phone]  Fax [phone] [email or web address]

## 2019-08-12 ENCOUNTER — Encounter: Payer: Self-pay | Admitting: Internal Medicine

## 2019-08-15 ENCOUNTER — Ambulatory Visit: Payer: Medicare Other | Admitting: Surgery

## 2019-08-15 ENCOUNTER — Encounter (HOSPITAL_COMMUNITY): Payer: Medicare Other

## 2019-08-15 ENCOUNTER — Other Ambulatory Visit (HOSPITAL_COMMUNITY): Payer: Medicare Other

## 2019-09-14 ENCOUNTER — Other Ambulatory Visit: Payer: Self-pay | Admitting: *Deleted

## 2019-09-14 DIAGNOSIS — I714 Abdominal aortic aneurysm, without rupture, unspecified: Secondary | ICD-10-CM

## 2019-09-14 DIAGNOSIS — I6523 Occlusion and stenosis of bilateral carotid arteries: Secondary | ICD-10-CM

## 2019-09-16 ENCOUNTER — Telehealth (HOSPITAL_COMMUNITY): Payer: Self-pay

## 2019-09-16 NOTE — Telephone Encounter (Signed)

## 2019-09-19 ENCOUNTER — Ambulatory Visit (INDEPENDENT_AMBULATORY_CARE_PROVIDER_SITE_OTHER): Payer: Medicare Other | Admitting: Surgery

## 2019-09-19 ENCOUNTER — Other Ambulatory Visit: Payer: Self-pay

## 2019-09-19 ENCOUNTER — Ambulatory Visit (HOSPITAL_COMMUNITY)
Admission: RE | Admit: 2019-09-19 | Discharge: 2019-09-19 | Disposition: A | Discharge status: Home / Self Care | Payer: Medicare Other | Source: Ambulatory Visit | Attending: Surgery | Admitting: Surgery

## 2019-09-19 ENCOUNTER — Telehealth: Payer: Self-pay

## 2019-09-19 ENCOUNTER — Ambulatory Visit (INDEPENDENT_AMBULATORY_CARE_PROVIDER_SITE_OTHER)
Admission: RE | Admit: 2019-09-19 | Discharge: 2019-09-19 | Disposition: A | Discharge status: Home / Self Care | Payer: Medicare Other | Source: Ambulatory Visit | Attending: Surgery | Admitting: Surgery

## 2019-09-19 ENCOUNTER — Encounter: Payer: Self-pay | Admitting: Surgery

## 2019-09-19 VITALS — BP 118/76 | HR 92 | Temp 97.9°F | Resp 20 | Ht 74.0 in | Wt 279.0 lb

## 2019-09-19 DIAGNOSIS — I714 Abdominal aortic aneurysm, without rupture, unspecified: Secondary | ICD-10-CM

## 2019-09-19 DIAGNOSIS — I6523 Occlusion and stenosis of bilateral carotid arteries: Secondary | ICD-10-CM | POA: Insufficient documentation

## 2019-09-19 NOTE — Progress Notes (Signed)
[facility]  Patient name: Ryan Wise MRN: [id number] DOB: July 03, 1940 Sex: male   REASON FOR VISIT:    Follow up  HISOTRY OF PRESENT ILLNESS:    Ryan Wise a 78y.o.malereturns today for follow-up. 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 His postoperative course was uncomplicated.  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 Fredia Sorrow. He denies any abdominal pain.    He has a history of CAD, s/p CABG. He is followed by Dr. Rennis Golden. He is on a statin for hypercholesterolemia. He takes an ASA. He is medically managed for hypertension. He is a former smoker. He remains very active.   PAST MEDICAL HISTORY:   Past Medical History:  Diagnosis Date  . Aneurysm of infrarenal abdominal aorta (HCC)    a. 01/2016 s/p endovascular repair of AAA (EVAR).  . Arthritis   . Chronic combined systolic and diastolic CHF (congestive heart failure) (HCC)    a. 01/2016 Echo: EF 35-40%, diff HK, mildly dil Ao root, mild MR, mildly dil LA.  Marland Kitchen Coronary artery disease    a. 1999 s/p CABG x4 (LIMA->LAD, VG->OM, VG->Diag, VG->RCA); b. 11/2012 Inferior STEMI/PCI: LIMA->LAD ok, VG->Diag 100, VG->OM 100, VG->RCA was culprit (PTCA/thrombectomy->Xience Xpedition DES), EF 25%.  Marland Kitchen GERD (gastroesophageal reflux disease)   . High triglycerides   . Hypertension   . Ischemic Cardiomyopathy    a. 01/2016 Echo: EF 35-40%, diff HK, inflat, inf AK.  . Obesity   . Persistent atrial fibrillation (HCC)    a. Dx 01/2016-->slow vent response on beta blocker;  b. CHA2DS2VASc = 6-->Eliquis;  c. 03/03/2016 unsuccessful DCCV.  . Ruptured lumbar disc   . ST elevation myocardial infarction (STEMI) of inferior wall (HCC) 12/18/12   STEMI of inf. wall w/PCI  with Xperdition stent to VG to distal RCA  . Type II diabetes mellitus (HCC)      FAMILY HISTORY:   Family History  Problem Relation Age of Onset  . CAD Mother   . Heart disease Mother   . CAD Brother   . Heart attack Brother   . Hyperlipidemia Sister   . Leukemia Father   . CAD Maternal Grandmother   . Brain cancer Brother   . CAD Sister     SOCIAL HISTORY:   Social History   Tobacco Use  . Smoking status: Former Smoker    Packs/day: 4.50    Years: 30.00    Pack years: 135.00    Quit date: 05/07/1987    Years since quitting: 32.3  . Smokeless tobacco: Former Neurosurgeon    Types: Chew  Substance Use Topics  . Alcohol use: No     ALLERGIES:   Allergies  Allergen Reactions  . Other Other (See Comments)    Pain medication that he was given after open heart surgery-sweating and hallucinations (possibly percocet)  . Cholecalciferol [Vitamin D-3] Other (See Comments)    CAUSES BOILS   . Fish Oil Hives  . Aspirin Nausea And Vomiting    Can't take full strength uncoated aspirin  . Oxycodone Other (See Comments)    Hallucinations     CURRENT MEDICATIONS:   Current Outpatient Medications  Medication Sig Dispense Refill  . acetaminophen (TYLENOL) 500 MG tablet Take 500 mg by mouth every 6 (six) hours as needed  for mild pain.    Marland Kitchen albuterol (PROVENTIL HFA;VENTOLIN HFA) 108 (90 Base) MCG/ACT inhaler Inhale 1 puff into the lungs every 6 (six) hours as needed for wheezing or shortness of breath.    Marland Kitchen aspirin EC 81 MG EC tablet Take 1 tablet (81 mg total) by mouth daily.    Marland Kitchen atorvastatin (LIPITOR) 80 MG tablet TAKE 1 TABLET (80 MG TOTAL) BY MOUTH DAILY AT 6 PM. 90 tablet 3  . carboxymethylcellulose (REFRESH PLUS) 0.5 % SOLN Place 1 drop into both eyes 3 (three) times daily as needed (DRY EYES).    Marland Kitchen dofetilide (TIKOSYN) 250 MCG capsule Take 1 capsule (250 mcg total) by mouth 2 (two) times daily. 180 capsule 3  . ELIQUIS 5 MG TABS tablet TAKE 1 TABLET BY MOUTH TWICE A DAY  180 tablet 1  . Flaxseed, Linseed, (FLAX SEED OIL PO) Take 2 capsules by mouth 2 (two) times daily.    . furosemide (LASIX) 20 MG tablet TAKE 1 TABLET BY MOUTH EVERY DAY. PLEASE CALL TO SCHEDULE APPOINTMENT. 90 tablet 1  . isosorbide mononitrate (IMDUR) 30 MG 24 hr tablet TAKE 1 TABLET BY MOUTH EVERY DAY 90 tablet 3  . KLOR-CON M20 20 MEQ tablet TAKE 1.5 TABLETS (30 MEQ TOTAL) BY MOUTH DAILY. 135 tablet 3  . lisinopril (ZESTRIL) 5 MG tablet TAKE 1 TABLET (5 MG TOTAL) BY MOUTH DAILY. 90 tablet 3  . LORazepam (ATIVAN) 0.5 MG tablet Take 0.5 mg by mouth 2 (two) times daily.     . Magnesium 400 MG CAPS Take 400 mg by mouth 2 (two) times daily. 60 capsule 5  . metFORMIN (GLUCOPHAGE-XR) 750 MG 24 hr tablet Take 375 tablets by mouth daily.   1  . methocarbamol (ROBAXIN) 500 MG tablet Take 1 tablet (500 mg total) by mouth 2 (two) times daily. 20 tablet 0  . nitroGLYCERIN (NITROSTAT) 0.4 MG SL tablet Place 1 tablet (0.4 mg total) under the tongue every 5 (five) minutes as needed for chest pain. 25 tablet 3  . ranitidine (ZANTAC) 150 MG tablet Take 150 mg by mouth 2 (two) times daily.     No current facility-administered medications for this visit.    REVIEW OF SYSTEMS:   [X]  denotes positive finding, [ ]  denotes negative finding Cardiac  Comments:  Chest pain or chest pressure:    Shortness of breath upon exertion:    Short of breath when lying flat:    Irregular heart rhythm:        Vascular    Pain in calf, thigh, or hip brought on by ambulation:    Pain in feet at night that wakes you up from your sleep:     Blood clot in your veins:    Leg swelling:         Pulmonary    Oxygen at home:    Productive cough:     Wheezing:         Neurologic    Sudden weakness in arms or legs:     Sudden numbness in arms or legs:     Sudden onset of difficulty speaking or slurred speech:    Temporary loss of vision in one eye:     Problems with dizziness:         Gastrointestinal    Blood in  stool:     Vomited blood:         Genitourinary    Burning when urinating:     Blood in urine:  Psychiatric    Major depression:         Hematologic    Bleeding problems:    Problems with blood clotting too easily:        Skin    Rashes or ulcers:        Constitutional    Fever or chills:      PHYSICAL EXAM:   There were no vitals filed for this visit.  GENERAL: The patient is a well-nourished male, in no acute distress. The vital signs are documented above. CARDIAC: There is a regular rate and rhythm. PULMONARY: Non-labored respirations ABDOMEN: Soft and non-tender  MUSCULOSKELETAL: There are no major deformities or cyanosis. NEUROLOGIC: No focal weakness or paresthesias are detected. SKIN: There are no ulcers or rashes noted. PSYCHIATRIC: The patient has a normal affect.  STUDIES:   I have reviewed the following:  CTA (04-2019)  1. Endovascular repair of the abdominal aortic aneurysm. The aneurysm sac has slightly enlarged in size since 07/08/2018 but no clear source for an endoleak. Aortic stent graft is patent. 2. 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  NON-VASCULAR  1. No acute abnormality in the abdomen or pelvis.   EVAR: Abdominal Aorta: Patent endovascular aneurysm repair with no evidence of  endoleak. The largest aortic diameter is 8.97  CAROTID: Right Carotid: Velocities in the right ICA are consistent with a 1-39%  stenosis.   Left Carotid: Velocities in the left ICA are consistent with a 1-39%  stenosis.        Velocities on the 1-39% and 10-59% borderline.   Vertebrals: Bilateral vertebral arteries demonstrate antegrade flow.  Right        vertebral artery waveform is tardus parvus.  Subclavians: Normal flow hemodynamics were seen in the left subclavian  artery.        Right subclavian artery waveform is atypical.  MEDICAL ISSUES:   Carotid: Continue with  annual surveillance  AAA: He has undergone embolization of his endoleak. CT scan in December did not show any further evidence of a type II endoleak. His aneurysm has increased slightly. He is scheduled for repeat CT scan in 1 year. He will also see Dr. Fredia Sorrow at that time.    Charlena Cross, MD, FACS [facility] [phone] Pager [phone]

## 2019-09-19 NOTE — Telephone Encounter (Signed)
Ryan Wise from [facility] admitting Dept called.  Pt went to [facility] instead of [facility] for his ultrasound and appt with Dr. Myra Gianotti.  Pt is enroute to our office.  Harsh Trulock E., LPN.

## 2019-09-27 DIAGNOSIS — E119 Type 2 diabetes mellitus without complications: Secondary | ICD-10-CM | POA: Diagnosis not present

## 2019-09-27 DIAGNOSIS — D509 Iron deficiency anemia, unspecified: Secondary | ICD-10-CM | POA: Diagnosis not present

## 2019-10-04 DIAGNOSIS — I5032 Chronic diastolic (congestive) heart failure: Secondary | ICD-10-CM | POA: Diagnosis not present

## 2019-10-04 DIAGNOSIS — I2510 Atherosclerotic heart disease of native coronary artery without angina pectoris: Secondary | ICD-10-CM | POA: Diagnosis not present

## 2019-10-04 DIAGNOSIS — J449 Chronic obstructive pulmonary disease, unspecified: Secondary | ICD-10-CM | POA: Diagnosis not present

## 2019-10-04 DIAGNOSIS — N182 Chronic kidney disease, stage 2 (mild): Secondary | ICD-10-CM | POA: Diagnosis not present

## 2019-10-04 DIAGNOSIS — E1122 Type 2 diabetes mellitus with diabetic chronic kidney disease: Secondary | ICD-10-CM | POA: Diagnosis not present

## 2019-10-10 ENCOUNTER — Other Ambulatory Visit: Payer: Self-pay

## 2019-10-10 ENCOUNTER — Encounter: Payer: Self-pay | Admitting: Internal Medicine

## 2019-10-10 ENCOUNTER — Ambulatory Visit (INDEPENDENT_AMBULATORY_CARE_PROVIDER_SITE_OTHER): Payer: Medicare Other | Admitting: Internal Medicine

## 2019-10-10 VITALS — BP 118/72 | HR 69 | Ht 74.0 in | Wt 287.0 lb

## 2019-10-10 DIAGNOSIS — Z5181 Encounter for therapeutic drug level monitoring: Secondary | ICD-10-CM | POA: Diagnosis not present

## 2019-10-10 DIAGNOSIS — I255 Ischemic cardiomyopathy: Secondary | ICD-10-CM | POA: Diagnosis not present

## 2019-10-10 DIAGNOSIS — I4891 Unspecified atrial fibrillation: Secondary | ICD-10-CM | POA: Diagnosis not present

## 2019-10-10 DIAGNOSIS — Z9889 Other specified postprocedural states: Secondary | ICD-10-CM

## 2019-10-10 DIAGNOSIS — E785 Hyperlipidemia, unspecified: Secondary | ICD-10-CM

## 2019-10-10 DIAGNOSIS — Z79899 Other long term (current) drug therapy: Secondary | ICD-10-CM

## 2019-10-10 DIAGNOSIS — Z8679 Personal history of other diseases of the circulatory system: Secondary | ICD-10-CM

## 2019-10-10 DIAGNOSIS — I10 Essential (primary) hypertension: Secondary | ICD-10-CM

## 2019-10-10 MED ORDER — ATORVASTATIN CALCIUM 40 MG PO TABS: 40.0000 mg | ORAL_TABLET | Freq: Every day | ORAL | 3 refills | Status: DC

## 2019-10-10 NOTE — Progress Notes (Signed)
OFFICE NOTE  Chief Complaint:  No complaints  Primary Care Physician: Pearson Grippe, MD  HPI:  Ryan Wise is 79 y/o with a history of CAD, s/p CABG X 4 in 1999 with an LIMA-LAD, SVG-OM, SVG-Dx, SVG-RCA. He presented to [facility] 12/18/12 with a STEMI and was taken to the cath lab by Dr Tresa Endo. This revealed the LIMA to the LAD to be patent with collaterals to the Dx. The SVG- OM: was occluded and the SVG- DX: was occluded The culprit appeared to be occlusion of SVG to distal RCA with no flow proximal due to anastomis occlusion with thrombus. He underwent a very difficult PCI with PTCA/ thrombectomy. He had no flow phenomenon requiring IC/IV NTG/IC verapamil/ angiomax, brilinta 180 mg, integrelin. He ultimately received a Xience Xpedition stent to distal anastomosis. He did have acute, transient CHF but stabilized with diuresis. He did well and was transferred to telemetry on 12/21/12.. Troponin was greater than 20. EF at cath was 35%. EF by echo was 35=40% with severe hypokinesis of the basal inferolateral myocardium; moderate hypokinesis of the basal-mid inferior and mid inferolateral myocardium; and mild hypokinesis of the basal-mid inferoseptal and apical septal myocardium. After he was transferred to telemetry he awaked early in the morning of the 30th with neck pain. He was transferred back to the ICU by the cardiology fellow on call. The pt was seen and examined by Dr Rennis Golden the morning of the 29th. Dr Rennis Golden did not feel his symptoms were cardiac. We added a NSAID and Skelaxin. He took this for a day or 2 he reported his neck pain improved significantly.  Today is here for followup and appears to be doing fairly well. He does report some shortness of breath doing moderate to more intense activities which he is trying to avoid. He did some lawn mowing which made her short of breath after a while. 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 He does feel that his energy level has  improved significantly and hopefully this means that his EF has come up as well. He continues to take naproxen on a daily basis, however it was intended that he only take this for a short period time for his neck pain. He did however discontinue Skelaxin. I reviewed recent laboratory work from his primary care provider today including a cholesterol profile. I was surprised to see how low that was, noting that his total cholesterol was 80, triglycerides 107 HDL 35 and LDL of 24. This is on atorvastatin 80 mg daily.  I saw Ryan Wise back in the office today. He continues to do very well. He is asymptomatic this had no significant weight gain or worsening shortness of breath. He denies any extremity edema. Blood pressure is very well-controlled today 122/60. He recently had laboratory work through his primary care provider which shows excellent cholesterol control. He does have a history of cardiomyopathy and is due for repeat echocardiogram.   Ryan Wise returns today for follow-up. Overall he is doing exceedingly well. He denies any worsening chest pain or shortness of breath. He's done well on his current medicines. Cholesterol is been well controlled. 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 Recent echocardiogram earlier this year shows an improvement in EF up to 45%.  01/31/2016  Ryan Wise returns today for follow-up. Unfortunately recently presented to the emergency department with abdominal pain, nausea and vomiting as well as diarrhea and was thought to have a  gastroenteritis. 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 Of note, he had an ultrasound of the abdomen in April 2016 which the radiologist commented that "the abdominal aorta is poorly seen due to overlying bowel gas". Subsequently he underwent EVAR which is complicated by a small type II endoleak. Since surgery Ryan Wise is felt somewhat fatigued and does get short of  breath with some minimal exertion. In the office today he was noted to be in atrial fibrillation with a slow ventricular response at 47. This is a new diagnosis for him. He is on low-dose metoprolol, given his history of cardiomyopathy. He only takes aspirin for his current anticoagulation. 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  02/14/2016  Ryan Wise returns today for follow-up. He has done well with discontinuing his beta blocker. Heart rate is now improved up to 58. He remains in atrial fibrillation. He does not feel quite as fatigued but does notice some fatigue. He seems to be tolerating Eliquis without any bleeding problems. I did review his recent endoleak with Dr. Myra Gianotti who felt it was okay to start Eliquis in the setting of recent EVAR. At this point the next step would be to try to obtain a normal sinus rhythm. He will need to be anticoagulated for at least 3-4 weeks prior to cardioversion.  04/15/2016  Ryan Wise returns today for follow-up. He underwent outpatient cardioversion by myself which was unsuccessful. I therefore arranged for him to go on Tikosyn therapy. He was admitted and loaded on 500 g twice daily of Tikosyn and required cardioversion to get to sinus rhythm. This was successful and he has maintained sinus rhythm since then. He was discharged and followed up in the A. fib clinic. He was noted to have some hypokalemia and is due for repeat check of his potassium and magnesium. It was recommended that he have EKGs every 3 months and repeat of potassium and magnesium as per prescribing guidelines. He recently was seen in the emergency department for a "stabbing" mid upper back pain. This was felt to be reproducible and musculoskeletal. He did have a CT scan which ruled out dissection. He was given muscle relaxants and noted improvement with that and Tylenol.  07/31/2016  Ryan Wise returns today for follow-up. Overall he seems to be doing  well. EKG shows sinus rhythm today. QTC of 469 ms. He is on Tikosyn 500 g twice a day. He is potassium 2 days ago was 4.6 however magnesium was slightly low at 1.9. He is on magnesium oxide 400 mg daily. He denies any chest pain or worsening shortness of breath. There has been about a 6 pound weight gain however he reports has not been as active. He recently had bilateral cataract surgery for which she is recovering. He also saw Dr. Myra Gianotti recently, who noted that he had a small endoleak from his AAA repair but he will continue to monitor.  02/16/2017  Ryan Wise returns today for follow-up. He seems to be doing really well. His echo recent showed improvement in LV function with an EF increased to 45-50%. He denies any recurrent atrial fibrillation. He appears to be in either a low atrial accelerated junctional rhythm today is 69. QTC is 467 ms. He is on dofetilide 500 g twice a day. He also takes Eliquis and denies any bleeding problems. He's scheduled for follow-up with Dr. Myra Gianotti for an endoleak of his AAA repair. 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  09/02/2017  Ryan Wise was seen today in follow-up.  He denies any new symptoms.  He seems to be maintaining sinus on dofetilide.  His QTC today is 490 ms.  He had lab work in January which showed normal creatinine of 1.09.  Total cholesterol was 91 with LDL of 17.  Triglycerides were elevated to 21.  Hemoglobin A1c 6.5.  He continues to follow with Dr. Myra Gianotti as well as interventional radiology for an endoleak of his prior AAA repair.  He is on apixaban for stroke risk reduction.  He denies chest pain or worsening shortness of breath.  03/29/2019  Ryan Wise returns today for follow-up.  He was last seen in April via virtual visit.  He was without complaints.  Eventually, interventional radiology was able to stop an endoleak of his aortic graft.   Unfortunately his developed some anemia and was noted to be iron deficient on recent labs with an iron level of 10.  Otherwise H&H was 12.5 and 40.  His creatinine was normal.  His liver enzymes were normal.  His hemoglobin A1c was 6.6 and stable.  Cholesterol showed total of 72, triglycerides 136, HDL 28 and LDL of 20.  Vitamin B12 number was normal at 866 however he was recently placed on repletion.  Overall he feels well.  An EKG was performed today to monitor his Tikosyn.  The QTC is prolonged today at 547 ms, up from 4 to 90 ms during his last EKG.  There have been no new medication changes.  He is on potassium and magnesium supplements.  He is maintaining sinus rhythm.  10/10/2019  Ryan Wise is seen today for follow-up.  Overall he seems to be doing well without complaints.  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  He denies any chest pain or shortness of breath.  He recently followed up with Dr. Myra Gianotti for his AAA repair which has been stable if not improving.  He had some recent lipids done which showed excellent control of his cholesterol.  His LDL was 20 and a repeat just a few weeks ago was 28.  This is probably more treatment than he needs.  Blood pressure is well controlled today.  EKG shows he is maintaining sinus rhythm with first-degree AV block.  He does have some PVCs however he is asymptomatic with this.  QTC was 473 ms he remains on dofetilide 250 mcg twice daily.  PMHx:  Past Medical History:  Diagnosis Date  . Aneurysm of infrarenal abdominal aorta (HCC)    a. 01/2016 s/p endovascular repair of AAA (EVAR).  . Arthritis   . Chronic combined systolic and diastolic CHF (congestive heart failure) (HCC)    a. 01/2016 Echo: EF 35-40%, diff HK, mildly dil Ao root, mild MR, mildly dil LA.  Marland Kitchen Coronary artery disease    a. 1999 s/p CABG x4 (LIMA->LAD, VG->OM, VG->Diag, VG->RCA); b. 11/2012 Inferior STEMI/PCI: LIMA->LAD  ok, VG->Diag 100, VG->OM 100, VG->RCA was culprit (PTCA/thrombectomy->Xience Xpedition DES), EF 25%.  Marland Kitchen GERD (gastroesophageal reflux disease)   . High triglycerides   . Hypertension   . Ischemic Cardiomyopathy    a. 01/2016 Echo: EF 35-40%, diff HK, inflat, inf AK.  . Obesity   . Persistent atrial fibrillation (HCC)    a. Dx 01/2016-->slow vent response  on beta blocker;  b. CHA2DS2VASc = 6-->Eliquis;  c. 03/03/2016 unsuccessful DCCV.  . Ruptured lumbar disc   . ST elevation myocardial infarction (STEMI) of inferior wall (HCC) 12/18/12   STEMI of inf. wall w/PCI with Xperdition stent to VG to distal RCA  . Type II diabetes mellitus (HCC)     Past Surgical History:  Procedure Laterality Date  . BACK SURGERY     (5) back surgeries  . CARDIOVERSION N/A 03/03/2016   Procedure: CARDIOVERSION;  Surgeon: Chrystie Nose, MD;  Location: [facility] ENDOSCOPY;  Service: Cardiovascular;  Laterality: N/A;  . CARDIOVERSION N/A 03/12/2016   Procedure: CARDIOVERSION;  Surgeon: Chilton Si, MD;  Location: [facility] ENDOSCOPY;  Service: Cardiovascular;  Laterality: N/A;  . CATARACT EXTRACTION, BILATERAL Right 07/10/2016   left eye 07/24/16  . CORONARY ARTERY BYPASS GRAFT  1999   (CAD) CABG was a 5-vessel bypass and had a questionable history of A fib. He underwent monitoring whick showed sinus bradycardia and PACs but no evidence of A fib.  . ENDOVASCULAR STENT INSERTION N/A 12/28/2015   Procedure: ENDOVASCULAR STENT GRAFT INSERTION;  Surgeon: Nada Libman, MD;  Location: [facility] OR;  Service: Vascular;  Laterality: N/A;  . IR ANGIOGRAM PELVIS SELECTIVE OR SUPRASELECTIVE  04/30/2018  . IR ANGIOGRAM SELECTIVE EACH ADDITIONAL VESSEL  04/30/2018  . IR ANGIOGRAM VISCERAL SELECTIVE  04/30/2018  . IR EMBO ARTERIAL NOT HEMORR HEMANG INC GUIDE ROADMAPPING  04/30/2018  . IR RADIOLOGIST EVAL & MGMT  03/24/2017  . IR RADIOLOGIST EVAL & MGMT  04/14/2018  . IR RADIOLOGIST EVAL & MGMT  07/08/2018  . IR RADIOLOGIST EVAL & MGMT   05/11/2019  . IR US GUIDE VASC ACCESS RIGHT  04/30/2018  . LEFT HEART CATHETERIZATION WITH CORONARY/GRAFT ANGIOGRAM  12/18/2012   Procedure: LEFT HEART CATHETERIZATION WITH Isabel Caprice;  Surgeon: [name], MD;  Location: [facility] CATH LAB;  Service: Cardiovascular;;  . LUMBAR DISC SURGERY    . open heart surgery  03/1998  . TONSILLECTOMY      FAMHx:  Family History  Problem Relation Age of Onset  . CAD Mother   . Heart disease Mother   . CAD Brother   . Heart attack Brother   . Hyperlipidemia Sister   . Leukemia Father   . CAD Maternal Grandmother   . Brain cancer Brother   . CAD Sister     SOCHx:   reports that he quit smoking about 32 years ago. He has a 135.00 pack-year smoking history. He has quit using smokeless tobacco.  His smokeless tobacco use included chew. He reports that he does not drink alcohol or use drugs.  ALLERGIES:  Allergies  Allergen Reactions  . Other Other (See Comments)    Pain medication that he was given after open heart surgery-sweating and hallucinations (possibly percocet)  . Cholecalciferol [Vitamin D-3] Other (See Comments)    CAUSES BOILS   . Fish Oil Hives  . Aspirin Nausea And Vomiting    Can't take full strength uncoated aspirin  . Oxycodone Other (See Comments)    Hallucinations    ROS: Pertinent items noted in HPI and remainder of comprehensive ROS otherwise negative.  HOME MEDS: Current Outpatient Medications  Medication Sig Dispense Refill  . acetaminophen (TYLENOL) 500 MG tablet Take 500 mg by mouth every 6 (six) hours as needed for mild pain.    Marland Kitchen albuterol (PROVENTIL HFA;VENTOLIN HFA) 108 (90 Base) MCG/ACT inhaler Inhale 1 puff into the lungs every 6 (six) hours as needed  for wheezing or shortness of breath.    Marland Kitchen. aspirin EC 81 MG EC tablet Take 1 tablet (81 mg total) by mouth daily.    Marland Kitchen. atorvastatin (LIPITOR) 80 MG tablet TAKE 1 TABLET (80 MG TOTAL) BY MOUTH DAILY AT 6 PM. 90 tablet 3  . carboxymethylcellulose  (REFRESH PLUS) 0.5 % SOLN Place 1 drop into both eyes 3 (three) times daily as needed (DRY EYES).    Marland Kitchen. dofetilide (TIKOSYN) 250 MCG capsule Take 1 capsule (250 mcg total) by mouth 2 (two) times daily. 180 capsule 3  . ELIQUIS 5 MG TABS tablet TAKE 1 TABLET BY MOUTH TWICE A DAY 180 tablet 1  . Flaxseed, Linseed, (FLAX SEED OIL PO) Take 2 capsules by mouth 2 (two) times daily.    . furosemide (LASIX) 20 MG tablet TAKE 1 TABLET BY MOUTH EVERY DAY. PLEASE CALL TO SCHEDULE APPOINTMENT. 90 tablet 1  . isosorbide mononitrate (IMDUR) 30 MG 24 hr tablet TAKE 1 TABLET BY MOUTH EVERY DAY 90 tablet 3  . KLOR-CON M20 20 MEQ tablet TAKE 1.5 TABLETS (30 MEQ TOTAL) BY MOUTH DAILY. 135 tablet 3  . lisinopril (ZESTRIL) 5 MG tablet TAKE 1 TABLET (5 MG TOTAL) BY MOUTH DAILY. 90 tablet 3  . LORazepam (ATIVAN) 0.5 MG tablet Take 0.5 mg by mouth 2 (two) times daily.     . Magnesium 400 MG CAPS Take 400 mg by mouth 2 (two) times daily. 60 capsule 5  . metFORMIN (GLUCOPHAGE-XR) 750 MG 24 hr tablet Take 375 tablets by mouth daily.   1  . methocarbamol (ROBAXIN) 500 MG tablet Take 1 tablet (500 mg total) by mouth 2 (two) times daily. 20 tablet 0  . nitroGLYCERIN (NITROSTAT) 0.4 MG SL tablet Place 1 tablet (0.4 mg total) under the tongue every 5 (five) minutes as needed for chest pain. 25 tablet 3  . ranitidine (ZANTAC) 150 MG tablet Take 150 mg by mouth 2 (two) times daily.     No current facility-administered medications for this visit.    LABS/IMAGING: No results found for this or any previous visit (from the past 48 hour(s)). No results found.  VITALS: BP 118/72   Pulse 69   Ht 6\' 2"  (1.88 m)   Wt 287 lb (130.2 kg)   BMI 36.85 kg/m   EXAM: General appearance: alert and no distress Neck: no carotid bruit, no JVD and thyroid not enlarged, symmetric, no tenderness/mass/nodules Lungs: clear to auscultation bilaterally Heart: regular rate and rhythm, S1, S2 normal, no murmur, click, rub or gallop Abdomen:  soft, non-tender; bowel sounds normal; no masses,  no organomegaly Extremities: extremities normal, atraumatic, no cyanosis or edema Pulses: 2+ and symmetric Skin: Skin color, texture, turgor normal. No rashes or lesions Neurologic: Grossly normal Psych: Pleasant  EKG: Sinus rhythm first-degree block with PVCs at 69, QTc 4 and 73 ms-personally reviewed  ASSESSMENT: 1. Atrial fibrillation with slow ventricular response-CHADSVASC score of 5 2. Chronic Tikosyn therapy 3. S/p AAA status post -EVAR with type II endoleak 4. Coronary artery disease status post CABG with ST elevation MI and stent placement to the vein graft to PDA, with occluded grafts to the OM and diagonal vessels and a patent LIMA to LAD 5. Ischemic cardiac myopathy EF 45% (improved to 45-50% by echo in 01/2017), prior inferior infact 6. Hypertension 7. Dyslipidemia  PLAN: 1.   Ryan Wise is doing well.  He has had a stable AAA repair.  He is remaining in sinus rhythm with a normal QTC  on dofetilide.  He denies any bleeding problems on Eliquis.  He has no chest pain or worsening shortness of breath.  Blood pressures well controlled.  His cholesterol is well past targets.  I think he can decrease his atorvastatin from 80 to 40 mg daily.  Continue physical activity and weight loss is recommended.  Plan follow-up with me in 6 months or sooner as necessary.  Pixie Casino, MD, [facility], [place] Director of the Advanced Lipid Disorders &  Cardiovascular Risk Reduction Clinic Diplomate of the American Board of Clinical Lipidology Attending Cardiologist  Direct Dial: [phone]  Fax: [phone]  Website:  [email or web address] 10/10/2019, 11:27 AM

## 2019-10-10 NOTE — Patient Instructions (Signed)
Medication Instructions:  DECREASE atorvastatin to 40mg  daily  *If you need a refill on your cardiac medications before your next appointment, please call your pharmacy*   Follow-Up: At [facility], you and your health needs are our priority.  As part of our continuing mission to provide you with exceptional heart care, we have created designated Provider Care Teams.  These Care Teams include your primary Cardiologist (physician) and Advanced Practice Providers (APPs -  Physician Assistants and Nurse Practitioners) who all work together to provide you with the care you need, when you need it.  We recommend signing up for the patient portal called "MyChart".  Sign up information is provided on this After Visit Summary.  MyChart is used to connect with patients for Virtual Visits (Telemedicine).  Patients are able to view lab/test results, encounter notes, upcoming appointments, etc.  Non-urgent messages can be sent to your provider as well.   To learn more about what you can do with MyChart, go to [facility].    Your next appointment:   6 month(s)  The format for your next appointment:   In Person  Provider:   You may see [email or web address]. [email or web address] or one of the following Advanced Practice Providers on your designated Care Team:    Rennis Golden, PA-C  Azalee Course, Micah Flesher or   [place], Judy Pimple    Other Instructions

## 2019-10-17 DIAGNOSIS — H18413 Arcus senilis, bilateral: Secondary | ICD-10-CM | POA: Diagnosis not present

## 2019-10-17 DIAGNOSIS — H353131 Nonexudative age-related macular degeneration, bilateral, early dry stage: Secondary | ICD-10-CM | POA: Diagnosis not present

## 2019-10-17 DIAGNOSIS — H11153 Pinguecula, bilateral: Secondary | ICD-10-CM | POA: Diagnosis not present

## 2019-10-17 DIAGNOSIS — H35372 Puckering of macula, left eye: Secondary | ICD-10-CM | POA: Diagnosis not present

## 2019-10-17 DIAGNOSIS — Z961 Presence of intraocular lens: Secondary | ICD-10-CM | POA: Diagnosis not present

## 2019-11-16 ENCOUNTER — Other Ambulatory Visit: Payer: Self-pay | Admitting: Internal Medicine

## 2019-12-01 ENCOUNTER — Other Ambulatory Visit: Payer: Self-pay | Admitting: Internal Medicine

## 2019-12-27 DIAGNOSIS — Z0000 Encounter for general adult medical examination without abnormal findings: Secondary | ICD-10-CM | POA: Diagnosis not present

## 2019-12-27 DIAGNOSIS — E785 Hyperlipidemia, unspecified: Secondary | ICD-10-CM | POA: Diagnosis not present

## 2019-12-27 DIAGNOSIS — E559 Vitamin D deficiency, unspecified: Secondary | ICD-10-CM | POA: Diagnosis not present

## 2019-12-27 DIAGNOSIS — D509 Iron deficiency anemia, unspecified: Secondary | ICD-10-CM | POA: Diagnosis not present

## 2019-12-27 DIAGNOSIS — E1121 Type 2 diabetes mellitus with diabetic nephropathy: Secondary | ICD-10-CM | POA: Diagnosis not present

## 2019-12-27 DIAGNOSIS — I10 Essential (primary) hypertension: Secondary | ICD-10-CM | POA: Diagnosis not present

## 2020-01-03 DIAGNOSIS — N182 Chronic kidney disease, stage 2 (mild): Secondary | ICD-10-CM | POA: Diagnosis not present

## 2020-01-03 DIAGNOSIS — Z0000 Encounter for general adult medical examination without abnormal findings: Secondary | ICD-10-CM | POA: Diagnosis not present

## 2020-01-05 ENCOUNTER — Other Ambulatory Visit: Payer: Self-pay | Admitting: Internal Medicine

## 2020-04-10 ENCOUNTER — Encounter: Payer: Self-pay | Admitting: Internal Medicine

## 2020-04-10 ENCOUNTER — Other Ambulatory Visit: Payer: Self-pay

## 2020-04-10 ENCOUNTER — Ambulatory Visit (INDEPENDENT_AMBULATORY_CARE_PROVIDER_SITE_OTHER): Payer: Medicare Other | Admitting: Internal Medicine

## 2020-04-10 VITALS — BP 130/72 | HR 88 | Ht 73.0 in | Wt 284.0 lb

## 2020-04-10 DIAGNOSIS — Z9889 Other specified postprocedural states: Secondary | ICD-10-CM | POA: Diagnosis not present

## 2020-04-10 DIAGNOSIS — I255 Ischemic cardiomyopathy: Secondary | ICD-10-CM | POA: Diagnosis not present

## 2020-04-10 DIAGNOSIS — I4891 Unspecified atrial fibrillation: Secondary | ICD-10-CM

## 2020-04-10 DIAGNOSIS — Z79899 Other long term (current) drug therapy: Secondary | ICD-10-CM

## 2020-04-10 DIAGNOSIS — I10 Essential (primary) hypertension: Secondary | ICD-10-CM

## 2020-04-10 DIAGNOSIS — Z8679 Personal history of other diseases of the circulatory system: Secondary | ICD-10-CM

## 2020-04-10 DIAGNOSIS — Z5181 Encounter for therapeutic drug level monitoring: Secondary | ICD-10-CM

## 2020-04-10 LAB — BASIC METABOLIC PANEL
BUN/Creatinine Ratio: 15 (ref 10–24)
BUN: 18 mg/dL (ref 8–27)
CO2: 26 mmol/L (ref 20–29)
Calcium: 9.2 mg/dL (ref 8.6–10.2)
Chloride: 103 mmol/L (ref 96–106)
Creatinine, Ser: 1.19 mg/dL (ref 0.76–1.27)
GFR calc Af Amer: 67 mL/min/{1.73_m2} (ref >59)
GFR calc non Af Amer: 58 mL/min/{1.73_m2} — ABNORMAL LOW (ref >59)
Glucose: 123 mg/dL — ABNORMAL HIGH (ref 65–99)
Potassium: 4.8 mmol/L (ref 3.5–5.2)
Sodium: 141 mmol/L (ref 134–144)

## 2020-04-10 LAB — MAGNESIUM: Magnesium: 2.1 mg/dL (ref 1.6–2.3)

## 2020-04-10 NOTE — Progress Notes (Signed)
OFFICE NOTE  Chief Complaint:  No complaints  Primary Care Physician: Pearson Grippe, MD  HPI:  Ryan Wise is 79 y/o with a history of CAD, s/p CABG X 4 in 1999 with an LIMA-LAD, SVG-OM, SVG-Dx, SVG-RCA. He presented to [facility] 12/18/12 with a STEMI and was taken to the cath lab by Dr Tresa Endo. This revealed the LIMA to the LAD to be patent with collaterals to the Dx. The SVG- OM: was occluded and the SVG- DX: was occluded The culprit appeared to be occlusion of SVG to distal RCA with no flow proximal due to anastomis occlusion with thrombus. He underwent a very difficult PCI with PTCA/ thrombectomy. He had no flow phenomenon requiring IC/IV NTG/IC verapamil/ angiomax, brilinta 180 mg, integrelin. He ultimately received a Xience Xpedition stent to distal anastomosis. He did have acute, transient CHF but stabilized with diuresis. He did well and was transferred to telemetry on 12/21/12.. Troponin was greater than 20. EF at cath was 35%. EF by echo was 35=40% with severe hypokinesis of the basal inferolateral myocardium; moderate hypokinesis of the basal-mid inferior and mid inferolateral myocardium; and mild hypokinesis of the basal-mid inferoseptal and apical septal myocardium. After he was transferred to telemetry he awaked early in the morning of the 30th with neck pain. He was transferred back to the ICU by the cardiology fellow on call. The pt was seen and examined by Dr Rennis Golden the morning of the 29th. Dr Rennis Golden did not feel his symptoms were cardiac. We added a NSAID and Skelaxin. He took this for a day or 2 he reported his neck pain improved significantly.  Today is here for followup and appears to be doing fairly well. He does report some shortness of breath doing moderate to more intense activities which he is trying to avoid. He did some lawn mowing which made her short of breath after a while. 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 He does feel that his energy level has  improved significantly and hopefully this means that his EF has come up as well. He continues to take naproxen on a daily basis, however it was intended that he only take this for a short period time for his neck pain. He did however discontinue Skelaxin. I reviewed recent laboratory work from his primary care provider today including a cholesterol profile. I was surprised to see how low that was, noting that his total cholesterol was 80, triglycerides 107 HDL 35 and LDL of 24. This is on atorvastatin 80 mg daily.  I saw [name] back in the office today. He continues to do very well. He is asymptomatic this had no significant weight gain or worsening shortness of breath. He denies any extremity edema. Blood pressure is very well-controlled today 122/60. He recently had laboratory work through his primary care provider which shows excellent cholesterol control. He does have a history of cardiomyopathy and is due for repeat echocardiogram.   [name] returns today for follow-up. Overall he is doing exceedingly well. He denies any worsening chest pain or shortness of breath. He's done well on his current medicines. Cholesterol is been well controlled. 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 Recent echocardiogram earlier this year shows an improvement in EF up to 45%.  01/31/2016  [name] returns today for follow-up. Unfortunately recently presented to the emergency department with abdominal pain, nausea and vomiting as well as diarrhea and was thought to have a  gastroenteritis. 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 Of note, he had an ultrasound of the abdomen in April 2016 which the radiologist commented that "the abdominal aorta is poorly seen due to overlying bowel gas". Subsequently he underwent EVAR which is complicated by a small type II endoleak. Since surgery Mr. Rozo is felt somewhat fatigued and does get short of  breath with some minimal exertion. In the office today he was noted to be in atrial fibrillation with a slow ventricular response at 47. This is a new diagnosis for him. He is on low-dose metoprolol, given his history of cardiomyopathy. He only takes aspirin for his current anticoagulation. 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  02/14/2016  [name] returns today for follow-up. He has done well with discontinuing his beta blocker. Heart rate is now improved up to 58. He remains in atrial fibrillation. He does not feel quite as fatigued but does notice some fatigue. He seems to be tolerating Eliquis without any bleeding problems. I did review his recent endoleak with Dr. Myra Gianotti who felt it was okay to start Eliquis in the setting of recent EVAR. At this point the next step would be to try to obtain a normal sinus rhythm. He will need to be anticoagulated for at least 3-4 weeks prior to cardioversion.  04/15/2016  [name] returns today for follow-up. He underwent outpatient cardioversion by myself which was unsuccessful. I therefore arranged for him to go on Tikosyn therapy. He was admitted and loaded on 500 g twice daily of Tikosyn and required cardioversion to get to sinus rhythm. This was successful and he has maintained sinus rhythm since then. He was discharged and followed up in the A. fib clinic. He was noted to have some hypokalemia and is due for repeat check of his potassium and magnesium. It was recommended that he have EKGs every 3 months and repeat of potassium and magnesium as per prescribing guidelines. He recently was seen in the emergency department for a "stabbing" mid upper back pain. This was felt to be reproducible and musculoskeletal. He did have a CT scan which ruled out dissection. He was given muscle relaxants and noted improvement with that and Tylenol.  07/31/2016  [name] returns today for follow-up. Overall he seems to be doing  well. EKG shows sinus rhythm today. QTC of 469 ms. He is on Tikosyn 500 g twice a day. He is potassium 2 days ago was 4.6 however magnesium was slightly low at 1.9. He is on magnesium oxide 400 mg daily. He denies any chest pain or worsening shortness of breath. There has been about a 6 pound weight gain however he reports has not been as active. He recently had bilateral cataract surgery for which she is recovering. He also saw Dr. Myra Gianotti recently, who noted that he had a small endoleak from his AAA repair but he will continue to monitor.  02/16/2017  Mr. Savas returns today for follow-up. He seems to be doing really well. His echo recent showed improvement in LV function with an EF increased to 45-50%. He denies any recurrent atrial fibrillation. He appears to be in either a low atrial accelerated junctional rhythm today is 69. QTC is 467 ms. He is on dofetilide 500 g twice a day. He also takes Eliquis and denies any bleeding problems. He's scheduled for follow-up with Dr. Myra Gianotti for an endoleak of his AAA repair. 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  09/02/2017  Mr. Neil Crouch was seen today in follow-up.  He denies any new symptoms.  He seems to be maintaining sinus on dofetilide.  His QTC today is 490 ms.  He had lab work in January which showed normal creatinine of 1.09.  Total cholesterol was 91 with LDL of 17.  Triglycerides were elevated to 21.  Hemoglobin A1c 6.5.  He continues to follow with Dr. Myra Gianotti as well as interventional radiology for an endoleak of his prior AAA repair.  He is on apixaban for stroke risk reduction.  He denies chest pain or worsening shortness of breath.  03/29/2019  Mr. Neil Crouch returns today for follow-up.  He was last seen in April via virtual visit.  He was without complaints.  Eventually, interventional radiology was able to stop an endoleak of his aortic graft.   Unfortunately his developed some anemia and was noted to be iron deficient on recent labs with an iron level of 10.  Otherwise H&H was 12.5 and 40.  His creatinine was normal.  His liver enzymes were normal.  His hemoglobin A1c was 6.6 and stable.  Cholesterol showed total of 72, triglycerides 136, HDL 28 and LDL of 20.  Vitamin B12 number was normal at 866 however he was recently placed on repletion.  Overall he feels well.  An EKG was performed today to monitor his Tikosyn.  The QTC is prolonged today at 547 ms, up from 4 to 90 ms during his last EKG.  There have been no new medication changes.  He is on potassium and magnesium supplements.  He is maintaining sinus rhythm.  10/10/2019  Mr. Neil Crouch is seen today for follow-up.  Overall he seems to be doing well without complaints.  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  He denies any chest pain or shortness of breath.  He recently followed up with Dr. Myra Gianotti for his AAA repair which has been stable if not improving.  He had some recent lipids done which showed excellent control of his cholesterol.  His LDL was 20 and a repeat just a few weeks ago was 28.  This is probably more treatment than he needs.  Blood pressure is well controlled today.  EKG shows he is maintaining sinus rhythm with first-degree AV block.  He does have some PVCs however he is asymptomatic with this.  QTC was 473 ms he remains on dofetilide 250 mcg twice daily.  04/11/2020  Mr. Neil Crouch returns today for follow-up.  Overall he is doing well without any new complaints.  He reports his AAA repair is stable without active endoleak.  His blood pressure has been well controlled.  He has no chest pain or worsening shortness of breath.  He denies palpitations or recurrent A. fib.  EKG today shows an ectopic atrial rhythm but the QTC is prolonged at 520 ms.  This is new for him.  He does take dofetilide 250 mcg twice daily  and Eliquis which she is tolerating well.  He is also on supplemental potassium and magnesium.  Those will need to be reassessed.  PMHx:  Past Medical History:  Diagnosis Date  . Aneurysm of infrarenal abdominal aorta (HCC)    a. 01/2016 s/p endovascular repair of AAA (EVAR).  . Arthritis   . Chronic combined systolic and  diastolic CHF (congestive heart failure) (HCC)    a. 01/2016 Echo: EF 35-40%, diff HK, mildly dil Ao root, mild MR, mildly dil LA.  Marland Kitchen Coronary artery disease    a. 1999 s/p CABG x4 (LIMA->LAD, VG->OM, VG->Diag, VG->RCA); b. 11/2012 Inferior STEMI/PCI: LIMA->LAD ok, VG->Diag 100, VG->OM 100, VG->RCA was culprit (PTCA/thrombectomy->Xience Xpedition DES), EF 25%.  Marland Kitchen GERD (gastroesophageal reflux disease)   . High triglycerides   . Hypertension   . Ischemic Cardiomyopathy    a. 01/2016 Echo: EF 35-40%, diff HK, inflat, inf AK.  . Obesity   . Persistent atrial fibrillation (HCC)    a. Dx 01/2016-->slow vent response on beta blocker;  b. CHA2DS2VASc = 6-->Eliquis;  c. 03/03/2016 unsuccessful DCCV.  . Ruptured lumbar disc   . ST elevation myocardial infarction (STEMI) of inferior wall (HCC) 12/18/12   STEMI of inf. wall w/PCI with Xperdition stent to VG to distal RCA  . Type II diabetes mellitus (HCC)     Past Surgical History:  Procedure Laterality Date  . BACK SURGERY     (5) back surgeries  . CARDIOVERSION N/A 03/03/2016   Procedure: CARDIOVERSION;  Surgeon: Chrystie Nose, MD;  Location: [facility] ENDOSCOPY;  Service: Cardiovascular;  Laterality: N/A;  . CARDIOVERSION N/A 03/12/2016   Procedure: CARDIOVERSION;  Surgeon: Chilton Si, MD;  Location: [facility] ENDOSCOPY;  Service: Cardiovascular;  Laterality: N/A;  . CATARACT EXTRACTION, BILATERAL Right 07/10/2016   left eye 07/24/16  . CORONARY ARTERY BYPASS GRAFT  1999   (CAD) CABG was a 5-vessel bypass and had a questionable history of A fib. He underwent monitoring whick showed sinus bradycardia and PACs but no evidence of A fib.    . ENDOVASCULAR STENT INSERTION N/A 12/28/2015   Procedure: ENDOVASCULAR STENT GRAFT INSERTION;  Surgeon: Nada Libman, MD;  Location: [facility] OR;  Service: Vascular;  Laterality: N/A;  . IR ANGIOGRAM PELVIS SELECTIVE OR SUPRASELECTIVE  04/30/2018  . IR ANGIOGRAM SELECTIVE EACH ADDITIONAL VESSEL  04/30/2018  . IR ANGIOGRAM VISCERAL SELECTIVE  04/30/2018  . IR EMBO ARTERIAL NOT HEMORR HEMANG INC GUIDE ROADMAPPING  04/30/2018  . IR RADIOLOGIST EVAL & MGMT  03/24/2017  . IR RADIOLOGIST EVAL & MGMT  04/14/2018  . IR RADIOLOGIST EVAL & MGMT  07/08/2018  . IR RADIOLOGIST EVAL & MGMT  05/11/2019  . IR US GUIDE VASC ACCESS RIGHT  04/30/2018  . LEFT HEART CATHETERIZATION WITH CORONARY/GRAFT ANGIOGRAM  12/18/2012   Procedure: LEFT HEART CATHETERIZATION WITH Isabel Caprice;  Surgeon: [name], MD;  Location: [facility] CATH LAB;  Service: Cardiovascular;;  . LUMBAR DISC SURGERY    . open heart surgery  03/1998  . TONSILLECTOMY      FAMHx:  Family History  Problem Relation Age of Onset  . CAD Mother   . Heart disease Mother   . CAD Brother   . Heart attack Brother   . Hyperlipidemia Sister   . Leukemia Father   . CAD Maternal Grandmother   . Brain cancer Brother   . CAD Sister     SOCHx:   reports that he quit smoking about 32 years ago. He has a 135.00 pack-year smoking history. He has quit using smokeless tobacco.  His smokeless tobacco use included chew. He reports that he does not drink alcohol and does not use drugs.  ALLERGIES:  Allergies  Allergen Reactions  . Other Other (See Comments)    Pain medication that he was given after open heart surgery-sweating and hallucinations (possibly percocet)  . Cholecalciferol [  Vitamin D-3] Other (See Comments)    CAUSES BOILS   . Fish Oil Hives  . Aspirin Nausea And Vomiting    Can't take full strength uncoated aspirin  . Oxycodone Other (See Comments)    Hallucinations    ROS: Pertinent items noted in HPI and remainder of comprehensive  ROS otherwise negative.  HOME MEDS: Current Outpatient Medications  Medication Sig Dispense Refill  . acetaminophen (TYLENOL) 500 MG tablet Take 500 mg by mouth every 6 (six) hours as needed for mild pain.    Marland Kitchen. albuterol (PROVENTIL HFA;VENTOLIN HFA) 108 (90 Base) MCG/ACT inhaler Inhale 1 puff into the lungs every 6 (six) hours as needed for wheezing or shortness of breath.    Marland Kitchen. aspirin EC 81 MG EC tablet Take 1 tablet (81 mg total) by mouth daily.    Marland Kitchen. atorvastatin (LIPITOR) 40 MG tablet Take 1 tablet (40 mg total) by mouth daily at 6 PM. 90 tablet 3  . carboxymethylcellulose (REFRESH PLUS) 0.5 % SOLN Place 1 drop into both eyes 3 (three) times daily as needed (DRY EYES).    Marland Kitchen. dofetilide (TIKOSYN) 250 MCG capsule TAKE 1 CAPSULE (250 MCG TOTAL) BY MOUTH 2 (TWO) TIMES DAILY. 180 capsule 3  . ELIQUIS 5 MG TABS tablet TAKE 1 TABLET BY MOUTH TWICE A DAY 180 tablet 1  . Flaxseed, Linseed, (FLAX SEED OIL PO) Take 2 capsules by mouth 2 (two) times daily.    . furosemide (LASIX) 20 MG tablet Take 1 tablet (20 mg total) by mouth daily. 90 tablet 3  . isosorbide mononitrate (IMDUR) 30 MG 24 hr tablet TAKE 1 TABLET BY MOUTH EVERY DAY 90 tablet 3  . KLOR-CON M20 20 MEQ tablet TAKE 1.5 TABLETS (30 MEQ TOTAL) BY MOUTH DAILY. 135 tablet 3  . lisinopril (ZESTRIL) 5 MG tablet TAKE 1 TABLET (5 MG TOTAL) BY MOUTH DAILY. 90 tablet 3  . LORazepam (ATIVAN) 0.5 MG tablet Take 0.5 mg by mouth 2 (two) times daily.     . Magnesium 400 MG CAPS Take 400 mg by mouth 2 (two) times daily. 60 capsule 5  . metFORMIN (GLUCOPHAGE-XR) 750 MG 24 hr tablet Take 375 tablets by mouth daily.   1  . methocarbamol (ROBAXIN) 500 MG tablet Take 1 tablet (500 mg total) by mouth 2 (two) times daily. 20 tablet 0  . nitroGLYCERIN (NITROSTAT) 0.4 MG SL tablet Place 1 tablet (0.4 mg total) under the tongue every 5 (five) minutes as needed for chest pain. 25 tablet 3  . ranitidine (ZANTAC) 150 MG tablet Take 150 mg by mouth 2 (two) times daily.      No current facility-administered medications for this visit.    LABS/IMAGING: No results found for this or any previous visit (from the past 48 hour(s)). No results found.  VITALS: BP 130/72   Pulse 88   Ht 6\' 1"  (1.854 m)   Wt 284 lb (128.8 kg)   SpO2 96%   BMI 37.47 kg/m   EXAM: General appearance: alert and no distress Neck: no carotid bruit, no JVD and thyroid not enlarged, symmetric, no tenderness/mass/nodules Lungs: clear to auscultation bilaterally Heart: regular rate and rhythm, S1, S2 normal, no murmur, click, rub or gallop Abdomen: soft, non-tender; bowel sounds normal; no masses,  no organomegaly Extremities: extremities normal, atraumatic, no cyanosis or edema Pulses: 2+ and symmetric Skin: Skin color, texture, turgor normal. No rashes or lesions Neurologic: Grossly normal Psych: Pleasant  EKG: Ectopic atrial rhythm at 88, inferior infarct pattern, prolonged  QTC at 520 ms-personally reviewed  ASSESSMENT: 1. Atrial fibrillation with slow ventricular response-CHADSVASC score of 5 2. Chronic Tikosyn therapy 3. S/p AAA status post -EVAR with type II endoleak 4. Coronary artery disease status post CABG with ST elevation MI and stent placement to the vein graft to PDA, with occluded grafts to the OM and diagonal vessels and a patent LIMA to LAD 5. Ischemic cardiac myopathy EF 45% (improved to 45-50% by echo in 01/2017), prior inferior infact 6. Hypertension 7. Dyslipidemia  PLAN: 1.   Mr. Neil Crouch is doing well.  He denies any recurrent atrial fibrillation.  Unfortunately QTC is longer now on dofetilide, in fact it is increased by 0.5 ms.  This may be significant.  Of course he is not symptomatic with this.  We will check his potassium and magnesium.  I would like for him to follow-up with the A. fib clinic to see if he needs dose adjustment downwards or whether they would recommend continuing to monitor it.  Follow-up with me in 6 months or sooner as  necessary.  Chrystie Nose, MD, [facility], FACP  [place]  [facility]  Medical Director of the Advanced Lipid Disorders &  Cardiovascular Risk Reduction Clinic Diplomate of the American Board of Clinical Lipidology Attending Cardiologist  Direct Dial: [phone]  Fax: [phone]  Website:  [email or web address] Keahi [email or web address] 04/10/2020, 9:00 AM

## 2020-04-10 NOTE — Patient Instructions (Signed)
Medication Instructions:  Your physician recommends that you continue on your current medications as directed. Please refer to the Current Medication list given to you today.  *If you need a refill on your cardiac medications before your next appointment, please call your pharmacy*   Lab Work: BMET & Magnesium today   If you have labs (blood work) drawn today and your tests are completely normal, you will receive your results only by: Marland Kitchen MyChart Message (if you have MyChart) OR . A paper copy in the mail If you have any lab test that is abnormal or we need to change your treatment, we will call you to review the results.   Testing/Procedures: NONE   Follow-Up: At [facility], you and your health needs are our priority.  As part of our continuing mission to provide you with exceptional heart care, we have created designated Provider Care Teams.  These Care Teams include your primary Cardiologist (physician) and Advanced Practice Providers (APPs -  Physician Assistants and Nurse Practitioners) who all work together to provide you with the care you need, when you need it.  We recommend signing up for the patient portal called "MyChart".  Sign up information is provided on this After Visit Summary.  MyChart is used to connect with patients for Virtual Visits (Telemedicine).  Patients are able to view lab/test results, encounter notes, upcoming appointments, etc.  Non-urgent messages can be sent to your provider as well.   To learn more about what you can do with MyChart, go to [email or web address].    Your next appointment:   6 month(s)  The format for your next appointment:   In Person  Provider:   You may see Dr. Rennis Golden or one of the following Advanced Practice Providers on your designated Care Team:    Azalee Course, PA-C  Micah Flesher, [place] or   Judy Pimple, [place]    Other Instructions  Please schedule an appointment at the AFib Clinic with Rudi Coco NP

## 2020-04-11 ENCOUNTER — Other Ambulatory Visit: Payer: Self-pay | Admitting: Interventional Radiology

## 2020-04-11 ENCOUNTER — Other Ambulatory Visit: Payer: Self-pay

## 2020-04-11 DIAGNOSIS — I9789 Other postprocedural complications and disorders of the circulatory system, not elsewhere classified: Secondary | ICD-10-CM

## 2020-04-13 DIAGNOSIS — S83411A Sprain of medial collateral ligament of right knee, initial encounter: Secondary | ICD-10-CM | POA: Diagnosis not present

## 2020-04-13 DIAGNOSIS — M11261 Other chondrocalcinosis, right knee: Secondary | ICD-10-CM | POA: Diagnosis not present

## 2020-04-13 DIAGNOSIS — M1711 Unilateral primary osteoarthritis, right knee: Secondary | ICD-10-CM | POA: Diagnosis not present

## 2020-04-16 ENCOUNTER — Telehealth (HOSPITAL_COMMUNITY): Payer: Self-pay | Admitting: Nurse Practitioner

## 2020-04-16 NOTE — Telephone Encounter (Signed)
Patient called stating he had an appt on 04/17/20 with Rudi Coco, NP that he needed to reschedule to 05/17/20.  Patient stated he had injured his knee over the weekend and is in a brace.  Pt states he is unable to drive and does not have other transportation. I asked patient if there was any way he could come sooner than 05/17/20 for an appt and he stated he could not because of his knee injury.  So I offered patient an appt on 05/17/20 which he then declined stating since he did not know what would happen with his knee he did not want to reschedule at this time.  Patient stated once he had seen the doctor about his knee and knew if he would need surgery or not, he would then call us back to reschedule his appt.  Staff message to be sent to Dr. Rennis Golden and his Nurse Julaine Fusi, RN to make them aware pt cancelled appt.

## 2020-04-17 ENCOUNTER — Ambulatory Visit (HOSPITAL_COMMUNITY): Payer: Medicare Other | Admitting: Nurse Practitioner

## 2020-05-14 ENCOUNTER — Other Ambulatory Visit: Payer: Self-pay

## 2020-05-14 ENCOUNTER — Ambulatory Visit (HOSPITAL_COMMUNITY)
Admission: RE | Admit: 2020-05-14 | Discharge: 2020-05-14 | Disposition: A | Discharge status: Home / Self Care | Payer: Medicare Other | Source: Ambulatory Visit | Attending: Interventional Radiology | Admitting: Interventional Radiology

## 2020-05-14 DIAGNOSIS — I714 Abdominal aortic aneurysm, without rupture: Secondary | ICD-10-CM | POA: Diagnosis not present

## 2020-05-14 DIAGNOSIS — I723 Aneurysm of iliac artery: Secondary | ICD-10-CM | POA: Diagnosis not present

## 2020-05-14 DIAGNOSIS — I712 Thoracic aortic aneurysm, without rupture: Secondary | ICD-10-CM | POA: Diagnosis not present

## 2020-05-14 DIAGNOSIS — I9789 Other postprocedural complications and disorders of the circulatory system, not elsewhere classified: Secondary | ICD-10-CM

## 2020-05-14 DIAGNOSIS — K5730 Diverticulosis of large intestine without perforation or abscess without bleeding: Secondary | ICD-10-CM | POA: Diagnosis not present

## 2020-05-14 LAB — POCT I-STAT CREATININE: Creatinine, Ser: 1.1 mg/dL (ref 0.61–1.24)

## 2020-05-14 MED ORDER — IOHEXOL 350 MG/ML SOLN
100.0000 mL | Freq: Once | INTRAVENOUS | Status: AC | PRN
  Administered 2020-05-14: 100 mL via INTRAVENOUS

## 2020-05-31 ENCOUNTER — Other Ambulatory Visit: Payer: Self-pay

## 2020-05-31 ENCOUNTER — Ambulatory Visit
Admission: RE | Admit: 2020-05-31 | Discharge: 2020-05-31 | Disposition: A | Discharge status: Home / Self Care | Payer: Medicare Other | Source: Ambulatory Visit | Attending: Interventional Radiology | Admitting: Interventional Radiology

## 2020-05-31 ENCOUNTER — Encounter: Payer: Self-pay | Admitting: *Deleted

## 2020-05-31 DIAGNOSIS — I714 Abdominal aortic aneurysm, without rupture: Secondary | ICD-10-CM | POA: Diagnosis not present

## 2020-05-31 DIAGNOSIS — I9789 Other postprocedural complications and disorders of the circulatory system, not elsewhere classified: Secondary | ICD-10-CM

## 2020-05-31 DIAGNOSIS — T82330D Leakage of aortic (bifurcation) graft (replacement), subsequent encounter: Secondary | ICD-10-CM | POA: Diagnosis not present

## 2020-05-31 HISTORY — PX: IR RADIOLOGIST EVAL & MGMT: IMG5224

## 2020-05-31 NOTE — Progress Notes (Signed)
Chief Complaint: Patient was consulted remotely today (TeleHealth) for follow up after endoleak repair.  History of Present Illness: Ryan Wise is a 80 y.o. 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  The procedure was well-tolerated.  Postprocedural CTA on 07/08/2018 demonstrated resolution of any visible endoleak by CTA with stable aneurysm sac size.  CTA on 05/06/2019 demonstrated slight increase in aneurysm sac size without visible endoleak and a 1 year follow-up CTA was recommended.  He has been asymptomatic over the last year with no abdominal pain or other abdominal symptoms in the interval.  Past Medical History:  Diagnosis Date  . Aneurysm of infrarenal abdominal aorta (HCC)    a. 01/2016 s/p endovascular repair of AAA (EVAR).  . Arthritis   . Chronic combined systolic and diastolic CHF (congestive heart failure) (HCC)    a. 01/2016 Echo: EF 35-40%, diff HK, mildly dil Ao root, mild MR, mildly dil LA.  Marland Kitchen Coronary artery disease    a. 1999 s/p CABG x4 (LIMA->LAD, VG->OM, VG->Diag, VG->RCA); b. 11/2012 Inferior STEMI/PCI: LIMA->LAD ok, VG->Diag 100, VG->OM 100, VG->RCA was culprit (PTCA/thrombectomy->Xience Xpedition DES), EF 25%.  Marland Kitchen GERD (gastroesophageal reflux disease)   . High triglycerides   . Hypertension   . Ischemic Cardiomyopathy    a. 01/2016 Echo: EF 35-40%, diff HK, inflat, inf AK.  . Obesity   . Persistent atrial fibrillation (HCC)    a. Dx 01/2016-->slow vent response on beta blocker;  b. CHA2DS2VASc = 6-->Eliquis;  c. 03/03/2016 unsuccessful DCCV.  . Ruptured lumbar disc   . ST elevation myocardial infarction (STEMI) of inferior wall (HCC) 12/18/12   STEMI of inf. wall w/PCI with Xperdition stent to VG to distal RCA  . Type II diabetes mellitus (HCC)     Past Surgical History:  Procedure Laterality Date  . BACK SURGERY     (5) back surgeries  .  CARDIOVERSION N/A 03/03/2016   Procedure: CARDIOVERSION;  Surgeon: Chrystie Nose, MD;  Location: [facility] ENDOSCOPY;  Service: Cardiovascular;  Laterality: N/A;  . CARDIOVERSION N/A 03/12/2016   Procedure: CARDIOVERSION;  Surgeon: Chilton Si, MD;  Location: [facility] ENDOSCOPY;  Service: Cardiovascular;  Laterality: N/A;  . CATARACT EXTRACTION, BILATERAL Right 07/10/2016   left eye 07/24/16  . CORONARY ARTERY BYPASS GRAFT  1999   (CAD) CABG was a 5-vessel bypass and had a questionable history of A fib. He underwent monitoring whick showed sinus bradycardia and PACs but no evidence of A fib.  . ENDOVASCULAR STENT INSERTION N/A 12/28/2015   Procedure: ENDOVASCULAR STENT GRAFT INSERTION;  Surgeon: Nada Libman, MD;  Location: [facility] OR;  Service: Vascular;  Laterality: N/A;  . IR ANGIOGRAM PELVIS SELECTIVE OR SUPRASELECTIVE  04/30/2018  . IR ANGIOGRAM SELECTIVE EACH ADDITIONAL VESSEL  04/30/2018  . IR ANGIOGRAM VISCERAL SELECTIVE  04/30/2018  . IR EMBO ARTERIAL NOT HEMORR HEMANG INC GUIDE ROADMAPPING  04/30/2018  . IR RADIOLOGIST EVAL & MGMT  03/24/2017  . IR RADIOLOGIST EVAL & MGMT  04/14/2018  . IR RADIOLOGIST EVAL & MGMT  07/08/2018  . IR RADIOLOGIST EVAL & MGMT  05/11/2019  . IR US GUIDE VASC ACCESS RIGHT  04/30/2018  . LEFT HEART CATHETERIZATION WITH CORONARY/GRAFT ANGIOGRAM  12/18/2012   Procedure: LEFT HEART CATHETERIZATION WITH Isabel Caprice;  Surgeon: [name], MD;  Location: [facility] CATH LAB;  Service: Cardiovascular;;  . LUMBAR DISC SURGERY    . open heart  surgery  03/1998  . TONSILLECTOMY      Allergies: Other, Cholecalciferol [vitamin d-3], Fish oil, Aspirin, and Oxycodone  Medications: Prior to Admission medications   Medication Sig Start Date End Date Taking? Authorizing Provider  acetaminophen (TYLENOL) 500 MG tablet Take 500 mg by mouth every 6 (six) hours as needed for mild pain.    [provider]  albuterol (PROVENTIL HFA;VENTOLIN HFA) 108 (90 Base) MCG/ACT  inhaler Inhale 1 puff into the lungs every 6 (six) hours as needed for wheezing or shortness of breath.    [provider]  aspirin EC 81 MG EC tablet Take 1 tablet (81 mg total) by mouth daily. 12/22/12   Abelino Derrick, PA-C  atorvastatin (LIPITOR) 40 MG tablet Take 1 tablet (40 mg total) by mouth daily at 6 PM. 10/10/19   Hilty, Lisette Abu, MD  carboxymethylcellulose (REFRESH PLUS) 0.5 % SOLN Place 1 drop into both eyes 3 (three) times daily as needed (DRY EYES).    [provider]  dofetilide (TIKOSYN) 250 MCG capsule TAKE 1 CAPSULE (250 MCG TOTAL) BY MOUTH 2 (TWO) TIMES DAILY. 01/05/20   Hilty, Lisette Abu, MD  ELIQUIS 5 MG TABS tablet TAKE 1 TABLET BY MOUTH TWICE A DAY 12/05/19   Hilty, Lisette Abu, MD  Flaxseed, Linseed, (FLAX SEED OIL PO) Take 2 capsules by mouth 2 (two) times daily.    [provider]  furosemide (LASIX) 20 MG tablet Take 1 tablet (20 mg total) by mouth daily. 11/16/19   Hilty, Lisette Abu, MD  isosorbide mononitrate (IMDUR) 30 MG 24 hr tablet TAKE 1 TABLET BY MOUTH EVERY DAY 12/05/19   Hilty, Lisette Abu, MD  KLOR-CON M20 20 MEQ tablet TAKE 1.5 TABLETS (30 MEQ TOTAL) BY MOUTH DAILY. 07/14/19   Hilty, Lisette Abu, MD  lisinopril (ZESTRIL) 5 MG tablet TAKE 1 TABLET (5 MG TOTAL) BY MOUTH DAILY. 12/05/19   Hilty, Lisette Abu, MD  LORazepam (ATIVAN) 0.5 MG tablet Take 0.5 mg by mouth 2 (two) times daily.  01/07/13   Chrystie Nose, MD  Magnesium 400 MG CAPS Take 400 mg by mouth 2 (two) times daily. 07/31/16   Hilty, Lisette Abu, MD  metFORMIN (GLUCOPHAGE-XR) 750 MG 24 hr tablet Take 375 tablets by mouth daily.  11/23/15   [provider]  methocarbamol (ROBAXIN) 500 MG tablet Take 1 tablet (500 mg total) by mouth 2 (two) times daily. 06/21/18   Hedges, Tinnie Gens, PA-C  nitroGLYCERIN (NITROSTAT) 0.4 MG SL tablet Place 1 tablet (0.4 mg total) under the tongue every 5 (five) minutes as needed for chest pain. 12/22/12   Abelino Derrick, PA-C  ranitidine (ZANTAC) 150 MG tablet  Take 150 mg by mouth 2 (two) times daily.    [provider]     Family History  Problem Relation Age of Onset  . CAD Mother   . Heart disease Mother   . CAD Brother   . Heart attack Brother   . Hyperlipidemia Sister   . Leukemia Father   . CAD Maternal Grandmother   . Brain cancer Brother   . CAD Sister     Social History   Socioeconomic History  . Marital status: Divorced    Spouse name: Not on file  . Number of children: 1  . Years of education: Not on file  . Highest education level: Not on file  Occupational History  . Occupation: Geophysicist/field seismologist    Comment: employer = self  Tobacco Use  . Smoking  status: Former Smoker    Packs/day: 4.50    Years: 30.00    Pack years: 135.00    Quit date: 05/07/1987    Years since quitting: 33.0  . Smokeless tobacco: Former Systems developer    Types: Secondary school teacher  . Vaping Use: Never used  Substance and Sexual Activity  . Alcohol use: No  . Drug use: No  . Sexual activity: Not on file  Other Topics Concern  . Not on file  Social History Narrative  . Not on file   Social Determinants of Health   Financial Resource Strain: Not on file  Food Insecurity: Not on file  Transportation Needs: Not on file  Physical Activity: Not on file  Stress: Not on file  Social Connections: Not on file    Review of Systems  Constitutional: Negative.   Respiratory: Negative.   Cardiovascular: Negative.   Gastrointestinal: Negative.   Genitourinary: Negative.   Musculoskeletal: Negative.   Neurological: Negative.     Review of Systems: A 12 point ROS discussed and pertinent positives are indicated in the HPI above.  All other systems are negative.  Physical Exam No direct physical exam was performed (except for noted visual exam findings with Video Visits).   Vital Signs: There were no vitals taken for this visit.  Imaging: CT Angio Abd/Pel w/ and/or w/o  Result Date: 05/14/2020 CLINICAL DATA:  Aortic aneurysm post EVAR,  endoleak coiling 04/30/2018 EXAM: CTA ABDOMEN AND PELVIS WITHOUT AND WITH CONTRAST TECHNIQUE: Multidetector CT imaging of the abdomen and pelvis was performed using the standard protocol during bolus administration of intravenous contrast. Multiplanar reconstructed images and MIPs were obtained and reviewed to evaluate the vascular anatomy. CONTRAST:  179mL OMNIPAQUE IOHEXOL 350 MG/ML SOLN COMPARISON:  05/06/2019 and previous FINDINGS: VASCULAR Aorta: Tortuous mildly atheromatous visualized distal descending thoracic segment. Patent infrarenal bifurcated stent graft. No convincing endoleak. Multiple metallic coils in the excluded native aneurysm sac, resulting in streak artifact. Native sac diameter 9.5 x 9 cm (previously 9.3). Celiac: Mild narrowing at the level of the median arcuate ligament of the diaphragm, patent distally. SMA: Patent without evidence of aneurysm, dissection, vasculitis or significant stenosis. Renals: Single left, widely patent. Duplicated right, superior dominant, both patent. IMA: Origin coiling. Reconstituted shortly distally by visceral collaterals. Inflow: On the right, stent extends to distal common iliac. Mild tortuosity of the native external and internal iliac arteries with scattered plaque, no aneurysm or stenosis. On the left, stent graft extends to the mid common iliac, distal tines incompletely apposed. Dilatation of the distal native common iliac up to 2.8 cm diameter. Fusiform dilatation of proximal internal iliac up to 2.3 cm diameter. External iliac mildly tortuous, patent. Proximal Outflow: Atheromatous, patent. Veins: No obvious venous abnormality within the limitations of this arterial phase study. Review of the MIP images confirms the above findings. NON-VASCULAR Lower chest: No pleural or pericardial effusion. Subsegmental atelectasis or scarring posteriorly in the lung bases, right greater than left. Hepatobiliary: No focal liver abnormality is seen. No gallstones,  gallbladder wall thickening, or biliary dilatation. Pancreas: Unremarkable. No pancreatic ductal dilatation or surrounding inflammatory changes. Spleen: Normal in size without focal abnormality. Adrenals/Urinary Tract: Adrenal glands are unremarkable. Kidneys are normal, without renal calculi, focal lesion, or hydronephrosis. Bladder is unremarkable. Stomach/Bowel: Stomach is decompressed. Small bowel nondilated. Normal appendix. The colon is nondilated with scattered diverticula predominately in the sigmoid segment; no adjacent inflammatory change. Lymphatic: No abdominal or pelvic adenopathy. Reproductive: Prostate enlargement with central coarse calcification  Other: Bilateral pelvic phleboliths.  No ascites.  No free air. Musculoskeletal: Multilevel spondylitic change in the lower thoracic and lumbar spine. Previous laminotomy left L5-S1. no fracture or worrisome bone lesion. IMPRESSION: 1. Patent infrarenal bifurcated stent graft without convincing endoleak. Native aneurysm sac diameter 9.5 x 9 cm (previously 9.3). 2. 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 3. Colonic diverticulosis. Aortic Atherosclerosis (ICD10-I70.0). Electronically Signed   By: Corlis Leak M.D.   On: 05/14/2020 14:31    Labs:  CBC: No results for input(s): WBC, HGB, HCT, PLT in the last 8760 hours.  COAGS: No results for input(s): INR, APTT in the last 8760 hours.  BMP: Recent Labs    04/10/20 0936 05/14/20 0923  NA 141  --   K 4.8  --   CL 103  --   CO2 26  --   GLUCOSE 123*  --   BUN 18  --   CALCIUM 9.2  --   CREATININE 1.19 1.10  GFRNONAA 58*  --   GFRAA 67  --     Assessment and Plan:  The aneurysm sac on the most recent CTA dated 05/14/2020 was reported to be slightly increased in size measuring 9 x 9.5 cm.  This measurement does seem to be slightly generous.  When obtaining measurements at the same level on the scan  from 2020 and the most recent study, my measurements are 8.6 x 9.1 cm in 2020 and 8.8 x 9.2 cm in 2021.  I told Ryan Wise that this represents potentially minimal increase in size but there is no evidence of a clear endoleak by CTA on arterial or delayed phases of imaging.    There is visible opacification of a right-sided L3 lumbar artery on the arterial phase which corresponds to a finding by prior arteriography where the right L3 lumbar artery did appear to be perfused by collaterals but did not clearly supply an endoleak by arteriography in 2019.  On the current CTA, density measurements show no increase in density near the orifice of this lumbar artery between the arterial and venous phases and actually a decrease in measured density within the aneurysm sac at this level.  Given slight increase in measured aneurysm sac size, I told Ryan Wise that at least one additional follow-up CTA would be helpful until we can feel comfortable that the sac size has stabilized and/or there is more convincing evidence of a type II endoleak to warrant a second transcatheter procedure.  I recommended a follow-up CTA in 1 year which would be in December 2022.  He is also scheduled to follow-up with Dr. Myra Gianotti this year.  Electronically Signed: Reola Calkins 05/31/2020, 9:01 AM     I spent a total of 15 Minutes in remote  clinical consultation, greater than 50% of which was counseling/coordinating care for follow up after endoleak repair.    Visit type: Audio only (telephone). Audio (no video) only due to patient's lack of internet/smartphone capability. Alternative for in-person consultation at [facility], [address] [place], [place], [place]. This visit type was conducted due to national recommendations for restrictions regarding the COVID-19 Pandemic (e.g. social distancing).  This format is felt to be most appropriate for this patient at this time.  All issues noted in this document were discussed  and addressed.

## 2020-06-02 ENCOUNTER — Other Ambulatory Visit: Payer: Self-pay | Admitting: Internal Medicine

## 2020-06-18 ENCOUNTER — Ambulatory Visit: Payer: Medicare Other | Admitting: Surgery

## 2020-06-28 DIAGNOSIS — R946 Abnormal results of thyroid function studies: Secondary | ICD-10-CM | POA: Diagnosis not present

## 2020-06-28 DIAGNOSIS — E785 Hyperlipidemia, unspecified: Secondary | ICD-10-CM | POA: Diagnosis not present

## 2020-06-28 DIAGNOSIS — E1122 Type 2 diabetes mellitus with diabetic chronic kidney disease: Secondary | ICD-10-CM | POA: Diagnosis not present

## 2020-06-28 DIAGNOSIS — I10 Essential (primary) hypertension: Secondary | ICD-10-CM | POA: Diagnosis not present

## 2020-07-05 ENCOUNTER — Other Ambulatory Visit (HOSPITAL_COMMUNITY): Payer: Self-pay | Admitting: Internal Medicine

## 2020-07-05 DIAGNOSIS — R946 Abnormal results of thyroid function studies: Secondary | ICD-10-CM | POA: Diagnosis not present

## 2020-07-05 DIAGNOSIS — J449 Chronic obstructive pulmonary disease, unspecified: Secondary | ICD-10-CM | POA: Diagnosis not present

## 2020-07-05 DIAGNOSIS — I10 Essential (primary) hypertension: Secondary | ICD-10-CM | POA: Diagnosis not present

## 2020-07-05 DIAGNOSIS — M1711 Unilateral primary osteoarthritis, right knee: Secondary | ICD-10-CM | POA: Diagnosis not present

## 2020-07-05 DIAGNOSIS — E1122 Type 2 diabetes mellitus with diabetic chronic kidney disease: Secondary | ICD-10-CM | POA: Diagnosis not present

## 2020-07-05 DIAGNOSIS — E559 Vitamin D deficiency, unspecified: Secondary | ICD-10-CM | POA: Diagnosis not present

## 2020-07-05 DIAGNOSIS — K219 Gastro-esophageal reflux disease without esophagitis: Secondary | ICD-10-CM | POA: Diagnosis not present

## 2020-07-05 DIAGNOSIS — E785 Hyperlipidemia, unspecified: Secondary | ICD-10-CM | POA: Diagnosis not present

## 2020-07-05 DIAGNOSIS — E538 Deficiency of other specified B group vitamins: Secondary | ICD-10-CM | POA: Diagnosis not present

## 2020-07-05 DIAGNOSIS — I2510 Atherosclerotic heart disease of native coronary artery without angina pectoris: Secondary | ICD-10-CM | POA: Diagnosis not present

## 2020-07-09 ENCOUNTER — Ambulatory Visit (INDEPENDENT_AMBULATORY_CARE_PROVIDER_SITE_OTHER): Payer: Medicare Other | Admitting: Surgery

## 2020-07-09 ENCOUNTER — Other Ambulatory Visit: Payer: Self-pay

## 2020-07-09 ENCOUNTER — Encounter: Payer: Self-pay | Admitting: Surgery

## 2020-07-09 VITALS — BP 125/78 | HR 76 | Temp 97.9°F | Resp 20 | Ht 73.0 in | Wt 284.1 lb

## 2020-07-09 DIAGNOSIS — I714 Abdominal aortic aneurysm, without rupture, unspecified: Secondary | ICD-10-CM

## 2020-07-09 DIAGNOSIS — I6522 Occlusion and stenosis of left carotid artery: Secondary | ICD-10-CM

## 2020-07-09 NOTE — Progress Notes (Signed)
[facility]  Patient name: Ryan Wise MRN: [id number] DOB: 16-Jul-1940 Sex: male   REASON FOR VISIT:    Follow up  HISOTRY OF PRESENT ILLNESS:     Ryan Wise a 79y.o.malereturns today for follow-up. 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 His postoperative course was uncomplicated.He went on to have embolization of a type II endoleak from his IMA by Dr. Fredia Sorrow. He denies any abdominal pain.    He has a history of CAD, s/p CABG. He is followed by Dr. Rennis Golden. He is on a statin for hypercholesterolemia. He takes an ASA. He is medically managed for hypertension. He is a former smoker. He remains very active.   PAST MEDICAL HISTORY:   Past Medical History:  Diagnosis Date  . Aneurysm of infrarenal abdominal aorta (HCC)    a. 01/2016 s/p endovascular repair of AAA (EVAR).  . Arthritis   . Chronic combined systolic and diastolic CHF (congestive heart failure) (HCC)    a. 01/2016 Echo: EF 35-40%, diff HK, mildly dil Ao root, mild MR, mildly dil LA.  Marland Kitchen Coronary artery disease    a. 1999 s/p CABG x4 (LIMA->LAD, VG->OM, VG->Diag, VG->RCA); b. 11/2012 Inferior STEMI/PCI: LIMA->LAD ok, VG->Diag 100, VG->OM 100, VG->RCA was culprit (PTCA/thrombectomy->Xience Xpedition DES), EF 25%.  Marland Kitchen GERD (gastroesophageal reflux disease)   . High triglycerides   . Hypertension   . Ischemic Cardiomyopathy    a. 01/2016 Echo: EF 35-40%, diff HK, inflat, inf AK.  . Obesity   . Persistent atrial fibrillation (HCC)    a. Dx 01/2016-->slow vent response on beta blocker;  b. CHA2DS2VASc = 6-->Eliquis;  c. 03/03/2016 unsuccessful DCCV.  . Ruptured lumbar disc   . ST elevation myocardial infarction (STEMI) of inferior wall (HCC) 12/18/12   STEMI of inf. wall  w/PCI with Xperdition stent to VG to distal RCA  . Type II diabetes mellitus (HCC)      FAMILY HISTORY:   Family History  Problem Relation Age of Onset  . CAD Mother   . Heart disease Mother   . CAD Brother   . Heart attack Brother   . Hyperlipidemia Sister   . Leukemia Father   . CAD Maternal Grandmother   . Brain cancer Brother   . CAD Sister     SOCIAL HISTORY:   Social History   Tobacco Use  . Smoking status: Former Smoker    Packs/day: 4.50    Years: 30.00    Pack years: 135.00    Quit date: 05/07/1987    Years since quitting: 33.1  . Smokeless tobacco: Former Neurosurgeon    Types: Chew  Substance Use Topics  . Alcohol use: No     ALLERGIES:   Allergies  Allergen Reactions  . Other Other (See Comments)    Pain medication that he was given after open heart surgery-sweating and hallucinations (possibly percocet)  . Cholecalciferol [Vitamin D-3] Other (See Comments)    CAUSES BOILS   . Fish Oil Hives  . Aspirin Nausea And Vomiting    Can't take full strength uncoated aspirin  . Oxycodone Other (See Comments)    Hallucinations     CURRENT MEDICATIONS:   Current Outpatient Medications  Medication Sig Dispense Refill  . acetaminophen (TYLENOL) 500 MG tablet Take 500 mg by mouth every 6 (six) hours as needed for  mild pain.    Marland Kitchen albuterol (PROVENTIL HFA;VENTOLIN HFA) 108 (90 Base) MCG/ACT inhaler Inhale 1 puff into the lungs every 6 (six) hours as needed for wheezing or shortness of breath.    Marland Kitchen aspirin EC 81 MG EC tablet Take 1 tablet (81 mg total) by mouth daily.    Marland Kitchen atorvastatin (LIPITOR) 40 MG tablet Take 1 tablet (40 mg total) by mouth daily at 6 PM. 90 tablet 3  . carboxymethylcellulose (REFRESH PLUS) 0.5 % SOLN Place 1 drop into both eyes 3 (three) times daily as needed (DRY EYES).    Marland Kitchen dofetilide (TIKOSYN) 250 MCG capsule TAKE 1 CAPSULE (250 MCG TOTAL) BY MOUTH 2 (TWO) TIMES DAILY. 180 capsule 3  . ELIQUIS 5 MG TABS tablet TAKE 1 TABLET BY MOUTH TWICE A  DAY 180 tablet 1  . Flaxseed, Linseed, (FLAX SEED OIL PO) Take 2 capsules by mouth 2 (two) times daily.    . furosemide (LASIX) 20 MG tablet Take 1 tablet (20 mg total) by mouth daily. 90 tablet 3  . isosorbide mononitrate (IMDUR) 30 MG 24 hr tablet TAKE 1 TABLET BY MOUTH EVERY DAY 90 tablet 3  . KLOR-CON M20 20 MEQ tablet TAKE 1.5 TABLETS (30 MEQ TOTAL) BY MOUTH DAILY. 135 tablet 3  . lisinopril (ZESTRIL) 5 MG tablet TAKE 1 TABLET (5 MG TOTAL) BY MOUTH DAILY. 90 tablet 3  . LORazepam (ATIVAN) 0.5 MG tablet Take 0.5 mg by mouth 2 (two) times daily.    . Magnesium 400 MG CAPS Take 400 mg by mouth 2 (two) times daily. 60 capsule 5  . metFORMIN (GLUCOPHAGE-XR) 750 MG 24 hr tablet Take 375 tablets by mouth daily.   1  . methocarbamol (ROBAXIN) 500 MG tablet Take 1 tablet (500 mg total) by mouth 2 (two) times daily. 20 tablet 0  . nitroGLYCERIN (NITROSTAT) 0.4 MG SL tablet Place 1 tablet (0.4 mg total) under the tongue every 5 (five) minutes as needed for chest pain. 25 tablet 3  . ranitidine (ZANTAC) 150 MG tablet Take 150 mg by mouth 2 (two) times daily.     No current facility-administered medications for this visit.    REVIEW OF SYSTEMS:   [X]  denotes positive finding, [ ]  denotes negative finding Cardiac  Comments:  Chest pain or chest pressure:    Shortness of breath upon exertion:    Short of breath when lying flat:    Irregular heart rhythm:        Vascular    Pain in calf, thigh, or hip brought on by ambulation:    Pain in feet at night that wakes you up from your sleep:     Blood clot in your veins:    Leg swelling:         Pulmonary    Oxygen at home:    Productive cough:     Wheezing:         Neurologic    Sudden weakness in arms or legs:     Sudden numbness in arms or legs:     Sudden onset of difficulty speaking or slurred speech:    Temporary loss of vision in one eye:     Problems with dizziness:         Gastrointestinal    Blood in stool:     Vomited blood:          Genitourinary    Burning when urinating:     Blood in urine:  Psychiatric    Major depression:         Hematologic    Bleeding problems:    Problems with blood clotting too easily:        Skin    Rashes or ulcers:        Constitutional    Fever or chills:      PHYSICAL EXAM:   Vitals:   07/09/20 1102  BP: 125/78  Pulse: 76  Resp: 20  Temp: 97.9 F (36.6 C)  SpO2: 95%  Weight: 284 lb 1.6 oz (128.9 kg)  Height: 6\' 1"  (1.854 m)    GENERAL: The patient is a well-nourished male, in no acute distress. The vital signs are documented above. CARDIAC: There is a regular rate and rhythm. PULMONARY: Non-labored respirations ABDOMEN: Soft and non-tender with normal pitched bowel sounds.  MUSCULOSKELETAL: There are no major deformities or cyanosis. NEUROLOGIC: No focal weakness or paresthesias are detected. SKIN: There are no ulcers or rashes noted. PSYCHIATRIC: The patient has a normal affect.  STUDIES:   I have reviewed the following: 1. Patent infrarenal bifurcated stent graft without convincing endoleak. Native aneurysm sac diameter 9.5 x 9 cm (previously 9.3). 2. 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 3. Colonic diverticulosis.  MEDICAL ISSUES:   AAA: The patient has undergone treatment of the type II endoleak.  He recently saw Dr. who repeated the measurements on the CT scan and found that there has been very minimal growth in the aneurysm over the past year from 9.1 to 9.2 cm.  No obvious endoleak is visualized on the CT scan.  Therefore the patient will come back for follow-up CT scan in 1 year  Carotid: Remains asymptomatic we will follow up in 1 year with duplex    Fredia Sorrow, IV, MD, FACS [facility] [phone] Pager [phone]

## 2020-07-26 DIAGNOSIS — M1711 Unilateral primary osteoarthritis, right knee: Secondary | ICD-10-CM | POA: Diagnosis not present

## 2020-07-27 ENCOUNTER — Other Ambulatory Visit: Payer: Self-pay | Admitting: Orthopedic Surgery

## 2020-07-27 DIAGNOSIS — M25561 Pain in right knee: Secondary | ICD-10-CM

## 2020-07-27 DIAGNOSIS — M25461 Effusion, right knee: Secondary | ICD-10-CM

## 2020-08-12 ENCOUNTER — Ambulatory Visit
Admission: RE | Admit: 2020-08-12 | Discharge: 2020-08-12 | Disposition: A | Discharge status: Home / Self Care | Payer: Medicare Other | Source: Ambulatory Visit | Attending: Orthopedic Surgery | Admitting: Orthopedic Surgery

## 2020-08-12 ENCOUNTER — Other Ambulatory Visit: Payer: Self-pay

## 2020-08-12 DIAGNOSIS — M25561 Pain in right knee: Secondary | ICD-10-CM

## 2020-08-12 DIAGNOSIS — M25461 Effusion, right knee: Secondary | ICD-10-CM

## 2020-08-14 DIAGNOSIS — M11261 Other chondrocalcinosis, right knee: Secondary | ICD-10-CM | POA: Diagnosis not present

## 2020-08-14 DIAGNOSIS — S83281A Other tear of lateral meniscus, current injury, right knee, initial encounter: Secondary | ICD-10-CM | POA: Diagnosis not present

## 2020-08-14 DIAGNOSIS — M1711 Unilateral primary osteoarthritis, right knee: Secondary | ICD-10-CM | POA: Diagnosis not present

## 2020-08-14 DIAGNOSIS — S83241A Other tear of medial meniscus, current injury, right knee, initial encounter: Secondary | ICD-10-CM | POA: Diagnosis not present

## 2020-08-15 ENCOUNTER — Telehealth: Payer: Self-pay

## 2020-08-15 NOTE — Telephone Encounter (Signed)
Unable to leave message [id number] on 08/15/2020.  Patient needs call back

## 2020-08-15 NOTE — Telephone Encounter (Signed)
Patient with diagnosis of afib on Eliquis for anticoagulation.    Procedure:  RIGHT KNEE SCOPE, MENISCECTOMY   Date of procedure: 08/22/20  CHA2DS2-VASc Score = 6  This indicates a 9.7% annual risk of stroke. The patient's score is based upon: CHF History: Yes HTN History: Yes Diabetes History: Yes Stroke History: No Vascular Disease History: Yes Age Score: 2 Gender Score: 0     CrCl 76.6 ml/min  Per office protocol, patient can hold Eliquis for 2 days prior to procedure.   Marland Kitchen

## 2020-08-15 NOTE — Telephone Encounter (Signed)
   [facility] Pre-operative Risk Assessment    Request for surgical clearance:  1. What type of surgery is being performed? RIGHT KNEE SCOPE, MENISCECTOMY    2. When is this surgery scheduled? 08-22-2020   3. What type of clearance is required (medical clearance vs. Pharmacy clearance to hold med vs. Both)? BOTH  4. Are there any medications that need to be held prior to surgery and how long? PT TAKES ELIQUIS   5. Practice name and name of physician performing surgery? [facility]    6. What is the office phone number? [phone]   7.   What is the office fax number? [phone]  8.   Anesthesia type (None, local, MAC, general) ? CHOICE

## 2020-08-16 NOTE — Telephone Encounter (Signed)
Patient's daughter is returning call.

## 2020-08-16 NOTE — Telephone Encounter (Signed)
Ok for surgery in a surgery center - hold eliquis 2 days prior and restart when feasible after.  Dr Rennis Golden

## 2020-08-16 NOTE — Telephone Encounter (Signed)
I spoke with the patient's daughter.  She is aware of recommendation by our clinical pharmacist.  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  Last PCI in July 2014.  Last echocardiogram obtained in September 2018 showed EF 45 to 50%, mild LAE.  The patient was last seen by Dr. Rennis Golden on 04/10/2020 at which time he was doing well.  EKG obtained that day however showed ectopic atrial rhythm with QTC prolonged at 520 ms which is new for him. It was were advised for the patient to see A. fib clinic and see if his Tikosyn need to be adjusted.  I do not see that he went to the A. fib clinic or had any repeat EKG since.  According to the patient's daughter, he injured his knee and has a torn meniscus that need to be repaired.  Patient does not have any recent chest pain.  His dyspnea on exertion is related to sedentary lifestyle since November due to the knee issue.  He can slowly walk 1-2 blocks away from his home however that is likely the most he can do due to knee pain.  He is unable to climb stairs.  Overall the patient symptom seems to be stable from cardiac perspective.  We will forward to MD to review.  Patient's daughter says the surgeon's office can get [facility] however if Dr. Rennis Golden feels that it is better to do the surgery in the hospital, the surgery will need to be rescheduled to a later time.  Based on the daughter's report, patient requested to the surgeon not to do general anesthesia.  Dr. Rennis Golden, patient does not appears to be able to accomplish 4 METS of activity.  He has not had a repeat EKG since the last time he saw you at which time his QTC was prolonged.  His surgery is next Wednesday if it can be done in the surgical center, however if his risk is too high  and surgery need to be done in the hospital, procedure will have to be delayed for 1 more week.  Please review and forward your response to P [facility] DIV PREOP

## 2020-08-17 ENCOUNTER — Ambulatory Visit (INDEPENDENT_AMBULATORY_CARE_PROVIDER_SITE_OTHER): Payer: Medicare Other | Admitting: *Deleted

## 2020-08-17 ENCOUNTER — Other Ambulatory Visit: Payer: Self-pay

## 2020-08-17 DIAGNOSIS — Z01810 Encounter for preprocedural cardiovascular examination: Secondary | ICD-10-CM | POA: Diagnosis not present

## 2020-08-17 DIAGNOSIS — I4891 Unspecified atrial fibrillation: Secondary | ICD-10-CM

## 2020-08-17 NOTE — Telephone Encounter (Signed)
   Primary Cardiologist: Chrystie Nose, MD  Chart reviewed as part of pre-operative protocol coverage. Given past medical history and time since last visit, based on ACC/AHA guidelines, Ryan Wise would be at acceptable risk for the planned procedure without further cardiovascular testing.  Case has been reviewed with the patient's primary cardiologist, he is cleared to proceed with surgery.  We will obtain a preoperative EKG on 08/17/2020 to make sure his QTC is stable when compared to the last EKG.  The patient was advised that if he develops new symptoms prior to surgery to contact our office to arrange for a follow-up visit, and he verbalized understanding.  I will route this recommendation to the requesting party via Epic fax function and remove from pre-op pool.  Please call with questions.  [place], [place] 08/17/2020, 10:11 AM

## 2020-08-17 NOTE — Telephone Encounter (Signed)
Patient seen for nurse visit today.  I spoke with him.  He says he is feeling well.  His right knee is in some pain and he has torn a ligament in his menisci.  He will need arthroscopic surgery.  EKG was performed today which shows sinus rhythm and first-degree AV block.  It is read as a prolonged QTC however his T waves are diffusely flattened.  If a look at his septal T waves I was able to measure the QT interval as less than 500 ms manually.  I do not think this will be an issue for him to have surgery.  He should continue his current dose of dofetilide 250 mcg twice daily.  Plan follow-up as normally scheduled.  He can hold Eliquis up to 2 days prior to the procedure and resume when safe afterward from a bleeding standpoint.  Chrystie Nose, MD, [facility], FACP  [facility]  Medical Director of the [facility]  [facility] Diplomate of the American Board of Clinical Lipidology Attending Cardiologist  Direct Dial: [phone]  Fax: [phone]  Website:  [email or web address]

## 2020-08-17 NOTE — Progress Notes (Signed)
1.) Reason for visit: EKG for pre-op clearance  2.) Name of MD requesting visit: Azalee Course, PA and Dr. Rennis Golden  3.) H&P: see chart notes  4.) ROS related to problem: concerns about QTc - on Tikosyn  5.) Assessment and plan per MD: EKG performed, reviewed by Dr. Rennis Golden, pre-op clearance note updated by MD

## 2020-08-22 DIAGNOSIS — S83231A Complex tear of medial meniscus, current injury, right knee, initial encounter: Secondary | ICD-10-CM | POA: Diagnosis not present

## 2020-08-22 DIAGNOSIS — M94261 Chondromalacia, right knee: Secondary | ICD-10-CM | POA: Diagnosis not present

## 2020-08-22 DIAGNOSIS — S83271A Complex tear of lateral meniscus, current injury, right knee, initial encounter: Secondary | ICD-10-CM | POA: Diagnosis not present

## 2020-08-22 DIAGNOSIS — G8918 Other acute postprocedural pain: Secondary | ICD-10-CM | POA: Diagnosis not present

## 2020-08-22 DIAGNOSIS — M6751 Plica syndrome, right knee: Secondary | ICD-10-CM | POA: Diagnosis not present

## 2020-08-22 DIAGNOSIS — M65861 Other synovitis and tenosynovitis, right lower leg: Secondary | ICD-10-CM | POA: Diagnosis not present

## 2020-08-22 DIAGNOSIS — M11261 Other chondrocalcinosis, right knee: Secondary | ICD-10-CM | POA: Diagnosis not present

## 2020-08-28 DIAGNOSIS — M25661 Stiffness of right knee, not elsewhere classified: Secondary | ICD-10-CM | POA: Diagnosis not present

## 2020-08-28 DIAGNOSIS — R262 Difficulty in walking, not elsewhere classified: Secondary | ICD-10-CM | POA: Diagnosis not present

## 2020-08-28 DIAGNOSIS — Z9889 Other specified postprocedural states: Secondary | ICD-10-CM | POA: Diagnosis not present

## 2020-08-28 DIAGNOSIS — M25461 Effusion, right knee: Secondary | ICD-10-CM | POA: Diagnosis not present

## 2020-08-28 DIAGNOSIS — M25561 Pain in right knee: Secondary | ICD-10-CM | POA: Diagnosis not present

## 2020-08-30 DIAGNOSIS — R262 Difficulty in walking, not elsewhere classified: Secondary | ICD-10-CM | POA: Diagnosis not present

## 2020-08-30 DIAGNOSIS — M25661 Stiffness of right knee, not elsewhere classified: Secondary | ICD-10-CM | POA: Diagnosis not present

## 2020-08-30 DIAGNOSIS — M25461 Effusion, right knee: Secondary | ICD-10-CM | POA: Diagnosis not present

## 2020-08-30 DIAGNOSIS — M25561 Pain in right knee: Secondary | ICD-10-CM | POA: Diagnosis not present

## 2020-08-30 DIAGNOSIS — Z9889 Other specified postprocedural states: Secondary | ICD-10-CM | POA: Diagnosis not present

## 2020-09-05 DIAGNOSIS — R262 Difficulty in walking, not elsewhere classified: Secondary | ICD-10-CM | POA: Diagnosis not present

## 2020-09-05 DIAGNOSIS — M25661 Stiffness of right knee, not elsewhere classified: Secondary | ICD-10-CM | POA: Diagnosis not present

## 2020-09-05 DIAGNOSIS — M25561 Pain in right knee: Secondary | ICD-10-CM | POA: Diagnosis not present

## 2020-09-05 DIAGNOSIS — M25461 Effusion, right knee: Secondary | ICD-10-CM | POA: Diagnosis not present

## 2020-09-05 DIAGNOSIS — Z9889 Other specified postprocedural states: Secondary | ICD-10-CM | POA: Diagnosis not present

## 2020-09-13 DIAGNOSIS — M25561 Pain in right knee: Secondary | ICD-10-CM | POA: Diagnosis not present

## 2020-09-13 DIAGNOSIS — R262 Difficulty in walking, not elsewhere classified: Secondary | ICD-10-CM | POA: Diagnosis not present

## 2020-09-13 DIAGNOSIS — Z9889 Other specified postprocedural states: Secondary | ICD-10-CM | POA: Diagnosis not present

## 2020-09-13 DIAGNOSIS — M25661 Stiffness of right knee, not elsewhere classified: Secondary | ICD-10-CM | POA: Diagnosis not present

## 2020-09-13 DIAGNOSIS — M25461 Effusion, right knee: Secondary | ICD-10-CM | POA: Diagnosis not present

## 2020-09-18 DIAGNOSIS — Z9889 Other specified postprocedural states: Secondary | ICD-10-CM | POA: Diagnosis not present

## 2020-09-18 DIAGNOSIS — M25461 Effusion, right knee: Secondary | ICD-10-CM | POA: Diagnosis not present

## 2020-09-18 DIAGNOSIS — M25661 Stiffness of right knee, not elsewhere classified: Secondary | ICD-10-CM | POA: Diagnosis not present

## 2020-09-18 DIAGNOSIS — M25561 Pain in right knee: Secondary | ICD-10-CM | POA: Diagnosis not present

## 2020-09-18 DIAGNOSIS — R262 Difficulty in walking, not elsewhere classified: Secondary | ICD-10-CM | POA: Diagnosis not present

## 2020-10-05 ENCOUNTER — Other Ambulatory Visit: Payer: Self-pay | Admitting: Internal Medicine

## 2020-10-05 NOTE — Telephone Encounter (Signed)
Prescription refill request for Eliquis received. Indication:atrial fib Last office visit:11/21 hilty Scr:1.1  12/21 Age: 80 Weight:128.9 kg  Prescription refilled

## 2020-10-23 DIAGNOSIS — Z961 Presence of intraocular lens: Secondary | ICD-10-CM | POA: Diagnosis not present

## 2020-10-23 DIAGNOSIS — H35371 Puckering of macula, right eye: Secondary | ICD-10-CM | POA: Diagnosis not present

## 2020-10-23 DIAGNOSIS — H11153 Pinguecula, bilateral: Secondary | ICD-10-CM | POA: Diagnosis not present

## 2020-10-23 DIAGNOSIS — H353131 Nonexudative age-related macular degeneration, bilateral, early dry stage: Secondary | ICD-10-CM | POA: Diagnosis not present

## 2020-10-23 DIAGNOSIS — H35372 Puckering of macula, left eye: Secondary | ICD-10-CM | POA: Diagnosis not present

## 2020-11-01 DIAGNOSIS — Z9889 Other specified postprocedural states: Secondary | ICD-10-CM | POA: Diagnosis not present

## 2020-11-01 DIAGNOSIS — M1711 Unilateral primary osteoarthritis, right knee: Secondary | ICD-10-CM | POA: Diagnosis not present

## 2020-11-24 ENCOUNTER — Other Ambulatory Visit: Payer: Self-pay | Admitting: Internal Medicine

## 2021-01-08 DIAGNOSIS — E559 Vitamin D deficiency, unspecified: Secondary | ICD-10-CM | POA: Diagnosis not present

## 2021-01-08 DIAGNOSIS — I10 Essential (primary) hypertension: Secondary | ICD-10-CM | POA: Diagnosis not present

## 2021-01-08 DIAGNOSIS — E538 Deficiency of other specified B group vitamins: Secondary | ICD-10-CM | POA: Diagnosis not present

## 2021-01-08 DIAGNOSIS — Z0000 Encounter for general adult medical examination without abnormal findings: Secondary | ICD-10-CM | POA: Diagnosis not present

## 2021-01-08 DIAGNOSIS — E1122 Type 2 diabetes mellitus with diabetic chronic kidney disease: Secondary | ICD-10-CM | POA: Diagnosis not present

## 2021-01-08 DIAGNOSIS — E785 Hyperlipidemia, unspecified: Secondary | ICD-10-CM | POA: Diagnosis not present

## 2021-01-15 DIAGNOSIS — N182 Chronic kidney disease, stage 2 (mild): Secondary | ICD-10-CM | POA: Diagnosis not present

## 2021-01-15 DIAGNOSIS — M25561 Pain in right knee: Secondary | ICD-10-CM | POA: Diagnosis not present

## 2021-01-15 DIAGNOSIS — Z0000 Encounter for general adult medical examination without abnormal findings: Secondary | ICD-10-CM | POA: Diagnosis not present

## 2021-01-15 DIAGNOSIS — I5032 Chronic diastolic (congestive) heart failure: Secondary | ICD-10-CM | POA: Diagnosis not present

## 2021-01-15 DIAGNOSIS — J449 Chronic obstructive pulmonary disease, unspecified: Secondary | ICD-10-CM | POA: Diagnosis not present

## 2021-01-15 DIAGNOSIS — K219 Gastro-esophageal reflux disease without esophagitis: Secondary | ICD-10-CM | POA: Diagnosis not present

## 2021-01-15 DIAGNOSIS — E790 Hyperuricemia without signs of inflammatory arthritis and tophaceous disease: Secondary | ICD-10-CM | POA: Diagnosis not present

## 2021-01-15 DIAGNOSIS — I129 Hypertensive chronic kidney disease with stage 1 through stage 4 chronic kidney disease, or unspecified chronic kidney disease: Secondary | ICD-10-CM | POA: Diagnosis not present

## 2021-01-15 DIAGNOSIS — E1122 Type 2 diabetes mellitus with diabetic chronic kidney disease: Secondary | ICD-10-CM | POA: Diagnosis not present

## 2021-01-15 DIAGNOSIS — D638 Anemia in other chronic diseases classified elsewhere: Secondary | ICD-10-CM | POA: Diagnosis not present

## 2021-01-15 DIAGNOSIS — E785 Hyperlipidemia, unspecified: Secondary | ICD-10-CM | POA: Diagnosis not present

## 2021-01-15 DIAGNOSIS — R2689 Other abnormalities of gait and mobility: Secondary | ICD-10-CM | POA: Diagnosis not present

## 2021-01-28 ENCOUNTER — Other Ambulatory Visit: Payer: Self-pay | Admitting: Internal Medicine

## 2021-02-14 DIAGNOSIS — M1711 Unilateral primary osteoarthritis, right knee: Secondary | ICD-10-CM | POA: Diagnosis not present

## 2021-02-14 DIAGNOSIS — M170 Bilateral primary osteoarthritis of knee: Secondary | ICD-10-CM | POA: Diagnosis not present

## 2021-02-22 DIAGNOSIS — I130 Hypertensive heart and chronic kidney disease with heart failure and stage 1 through stage 4 chronic kidney disease, or unspecified chronic kidney disease: Secondary | ICD-10-CM | POA: Diagnosis not present

## 2021-02-22 DIAGNOSIS — I5032 Chronic diastolic (congestive) heart failure: Secondary | ICD-10-CM | POA: Diagnosis not present

## 2021-02-22 DIAGNOSIS — I2510 Atherosclerotic heart disease of native coronary artery without angina pectoris: Secondary | ICD-10-CM | POA: Diagnosis not present

## 2021-03-14 ENCOUNTER — Other Ambulatory Visit: Payer: Self-pay | Admitting: Internal Medicine

## 2021-03-14 NOTE — Telephone Encounter (Signed)
Prescription refill request for Eliquis received. Indication:Afib Last office visit:3/22 Scr:1.1 Age: 80 Weight:128.9 kg  Prescription refilled

## 2021-04-02 DIAGNOSIS — M1711 Unilateral primary osteoarthritis, right knee: Secondary | ICD-10-CM | POA: Diagnosis not present

## 2021-04-09 ENCOUNTER — Other Ambulatory Visit: Payer: Self-pay | Admitting: Interventional Radiology

## 2021-04-09 DIAGNOSIS — I9789 Other postprocedural complications and disorders of the circulatory system, not elsewhere classified: Secondary | ICD-10-CM

## 2021-04-09 DIAGNOSIS — M1711 Unilateral primary osteoarthritis, right knee: Secondary | ICD-10-CM | POA: Diagnosis not present

## 2021-04-16 DIAGNOSIS — M1711 Unilateral primary osteoarthritis, right knee: Secondary | ICD-10-CM | POA: Diagnosis not present

## 2021-04-17 DIAGNOSIS — D638 Anemia in other chronic diseases classified elsewhere: Secondary | ICD-10-CM | POA: Diagnosis not present

## 2021-04-17 DIAGNOSIS — N182 Chronic kidney disease, stage 2 (mild): Secondary | ICD-10-CM | POA: Diagnosis not present

## 2021-04-17 DIAGNOSIS — E1122 Type 2 diabetes mellitus with diabetic chronic kidney disease: Secondary | ICD-10-CM | POA: Diagnosis not present

## 2021-04-17 DIAGNOSIS — E559 Vitamin D deficiency, unspecified: Secondary | ICD-10-CM | POA: Diagnosis not present

## 2021-04-17 DIAGNOSIS — E790 Hyperuricemia without signs of inflammatory arthritis and tophaceous disease: Secondary | ICD-10-CM | POA: Diagnosis not present

## 2021-04-24 DIAGNOSIS — N182 Chronic kidney disease, stage 2 (mild): Secondary | ICD-10-CM | POA: Diagnosis not present

## 2021-04-24 DIAGNOSIS — E559 Vitamin D deficiency, unspecified: Secondary | ICD-10-CM | POA: Diagnosis not present

## 2021-04-24 DIAGNOSIS — I5032 Chronic diastolic (congestive) heart failure: Secondary | ICD-10-CM | POA: Diagnosis not present

## 2021-04-24 DIAGNOSIS — E785 Hyperlipidemia, unspecified: Secondary | ICD-10-CM | POA: Diagnosis not present

## 2021-04-24 DIAGNOSIS — K219 Gastro-esophageal reflux disease without esophagitis: Secondary | ICD-10-CM | POA: Diagnosis not present

## 2021-04-24 DIAGNOSIS — Z23 Encounter for immunization: Secondary | ICD-10-CM | POA: Diagnosis not present

## 2021-04-24 DIAGNOSIS — J449 Chronic obstructive pulmonary disease, unspecified: Secondary | ICD-10-CM | POA: Diagnosis not present

## 2021-04-24 DIAGNOSIS — D638 Anemia in other chronic diseases classified elsewhere: Secondary | ICD-10-CM | POA: Diagnosis not present

## 2021-04-24 DIAGNOSIS — I10 Essential (primary) hypertension: Secondary | ICD-10-CM | POA: Diagnosis not present

## 2021-04-24 DIAGNOSIS — E1122 Type 2 diabetes mellitus with diabetic chronic kidney disease: Secondary | ICD-10-CM | POA: Diagnosis not present

## 2021-06-26 ENCOUNTER — Other Ambulatory Visit (HOSPITAL_COMMUNITY): Payer: Self-pay | Admitting: Internal Medicine

## 2021-09-25 ENCOUNTER — Other Ambulatory Visit: Payer: Self-pay | Admitting: Internal Medicine

## 2021-10-02 ENCOUNTER — Other Ambulatory Visit: Payer: Self-pay | Admitting: Internal Medicine

## 2021-10-02 NOTE — Telephone Encounter (Signed)
Prescription refill request for Eliquis received. ?Indication:Afib ?Last office visit:upcoming ?Scr:1.1 ?Age: 81 ?Weight:128.9 kg ? ?Prescription refilled ? ?

## 2021-10-05 ENCOUNTER — Other Ambulatory Visit: Payer: Self-pay | Admitting: Internal Medicine

## 2021-10-29 DIAGNOSIS — E785 Hyperlipidemia, unspecified: Secondary | ICD-10-CM | POA: Diagnosis not present

## 2021-10-29 DIAGNOSIS — N182 Chronic kidney disease, stage 2 (mild): Secondary | ICD-10-CM | POA: Diagnosis not present

## 2021-10-29 DIAGNOSIS — J449 Chronic obstructive pulmonary disease, unspecified: Secondary | ICD-10-CM | POA: Diagnosis not present

## 2021-10-29 DIAGNOSIS — E559 Vitamin D deficiency, unspecified: Secondary | ICD-10-CM | POA: Diagnosis not present

## 2021-10-29 DIAGNOSIS — E1122 Type 2 diabetes mellitus with diabetic chronic kidney disease: Secondary | ICD-10-CM | POA: Diagnosis not present

## 2021-10-29 DIAGNOSIS — R059 Cough, unspecified: Secondary | ICD-10-CM | POA: Diagnosis not present

## 2021-10-29 DIAGNOSIS — I10 Essential (primary) hypertension: Secondary | ICD-10-CM | POA: Diagnosis not present

## 2021-10-29 DIAGNOSIS — I5032 Chronic diastolic (congestive) heart failure: Secondary | ICD-10-CM | POA: Diagnosis not present

## 2021-10-29 DIAGNOSIS — K219 Gastro-esophageal reflux disease without esophagitis: Secondary | ICD-10-CM | POA: Diagnosis not present

## 2021-10-30 DIAGNOSIS — H11153 Pinguecula, bilateral: Secondary | ICD-10-CM | POA: Diagnosis not present

## 2021-10-30 DIAGNOSIS — H353131 Nonexudative age-related macular degeneration, bilateral, early dry stage: Secondary | ICD-10-CM | POA: Diagnosis not present

## 2021-10-30 DIAGNOSIS — Z961 Presence of intraocular lens: Secondary | ICD-10-CM | POA: Diagnosis not present

## 2021-10-30 DIAGNOSIS — H04123 Dry eye syndrome of bilateral lacrimal glands: Secondary | ICD-10-CM | POA: Diagnosis not present

## 2021-11-07 ENCOUNTER — Other Ambulatory Visit: Payer: Self-pay | Admitting: Internal Medicine

## 2021-11-17 ENCOUNTER — Other Ambulatory Visit: Payer: Self-pay | Admitting: Internal Medicine

## 2021-11-18 ENCOUNTER — Ambulatory Visit (INDEPENDENT_AMBULATORY_CARE_PROVIDER_SITE_OTHER): Payer: Medicare Other | Admitting: Internal Medicine

## 2021-11-18 ENCOUNTER — Encounter: Payer: Self-pay | Admitting: Internal Medicine

## 2021-11-18 VITALS — BP 128/84 | HR 72 | Ht 72.0 in | Wt 287.0 lb

## 2021-11-18 DIAGNOSIS — I255 Ischemic cardiomyopathy: Secondary | ICD-10-CM

## 2021-11-18 DIAGNOSIS — Z79899 Other long term (current) drug therapy: Secondary | ICD-10-CM

## 2021-11-18 DIAGNOSIS — Z9889 Other specified postprocedural states: Secondary | ICD-10-CM | POA: Diagnosis not present

## 2021-11-18 DIAGNOSIS — I4891 Unspecified atrial fibrillation: Secondary | ICD-10-CM | POA: Diagnosis not present

## 2021-11-18 DIAGNOSIS — Z5181 Encounter for therapeutic drug level monitoring: Secondary | ICD-10-CM | POA: Diagnosis not present

## 2021-11-18 DIAGNOSIS — Z951 Presence of aortocoronary bypass graft: Secondary | ICD-10-CM | POA: Diagnosis not present

## 2021-11-18 DIAGNOSIS — Z8679 Personal history of other diseases of the circulatory system: Secondary | ICD-10-CM

## 2021-11-18 DIAGNOSIS — I10 Essential (primary) hypertension: Secondary | ICD-10-CM

## 2021-11-18 NOTE — Progress Notes (Signed)
OFFICE NOTE  Chief Complaint:  No complaints  Primary Care Physician: Linus Galas, NP  HPI:  Ryan Wise is 81 y/o with a history of CAD, s/p CABG X 4 in 1999 with an LIMA-LAD, SVG-OM, SVG-Dx, SVG-RCA. [facility] with a STEMI and was taken to the cath lab by Dr Tresa Endo. This revealed the LIMA to the LAD to be patent with collaterals to the Dx. The SVG- OM: was occluded and the SVG- DX: was occluded The culprit appeared to be occlusion of SVG to distal RCA with no flow proximal due to anastomis occlusion with thrombus. He underwent a very difficult PCI with PTCA/ thrombectomy. He had no flow phenomenon requiring IC/IV NTG/IC verapamil/ angiomax, brilinta 180 mg, integrelin. He ultimately received a Xience Xpedition stent to distal anastomosis. He did have acute, transient CHF but stabilized with diuresis. He did well and was transferred to telemetry on 12/21/12.. Troponin was greater than 20. EF at cath was 35%. EF by echo was 35=40% with severe hypokinesis of the basal inferolateral myocardium; moderate hypokinesis of the basal-mid inferior and mid inferolateral myocardium; and mild hypokinesis of the basal-mid inferoseptal and apical septal myocardium. After he was transferred to telemetry he awaked early in the morning of the 30th with neck pain. He was transferred back to the ICU by the cardiology fellow on call. The pt was seen and examined by Dr Rennis Golden the morning of the 29th. Dr Rennis Golden did not feel his symptoms were cardiac. We added a NSAID and Skelaxin. He took this for a day or 2 he reported his neck pain improved significantly.  Today is here for followup and appears to be doing fairly well. He does report some shortness of breath doing moderate to more intense activities which he is trying to avoid. He did some lawn mowing which made her short of breath after a while. 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 He does feel that his energy level has  improved significantly and hopefully this means that his EF has come up as well. He continues to take naproxen on a daily basis, however it was intended that he only take this for a short period time for his neck pain. He did however discontinue Skelaxin. I reviewed recent laboratory work from his primary care provider today including a cholesterol profile. I was surprised to see how low that was, noting that his total cholesterol was 80, triglycerides 107 HDL 35 and LDL of 24. This is on atorvastatin 80 mg daily.  I saw Mr. Mccreery back in the office today. He continues to do very well. He is asymptomatic this had no significant weight gain or worsening shortness of breath. He denies any extremity edema. Blood pressure is very well-controlled today 122/60. He recently had laboratory work through his primary care provider which shows excellent cholesterol control. He does have a history of cardiomyopathy and is due for repeat echocardiogram.   Mr. Burgert returns today for follow-up. Overall he is doing exceedingly well. He denies any worsening chest pain or shortness of breath. He's done well on his current medicines. Cholesterol is been well controlled. 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 Recent echocardiogram earlier this year shows an improvement in EF up to 45%.  01/31/2016  Mr. Lacina returns today for follow-up. Unfortunately recently presented to the emergency department with abdominal pain, nausea and vomiting as well as diarrhea and was thought to have a  gastroenteritis. 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 Of note, he had an ultrasound of the abdomen in April 2016 which the radiologist commented that "the abdominal aorta is poorly seen due to overlying bowel gas". Subsequently he underwent EVAR which is complicated by a small type II endoleak. Since surgery Mr. Dingley is felt somewhat fatigued and does get short of  breath with some minimal exertion. In the office today he was noted to be in atrial fibrillation with a slow ventricular response at 47. This is a new diagnosis for him. He is on low-dose metoprolol, given his history of cardiomyopathy. He only takes aspirin for his current anticoagulation. 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  02/14/2016  Mr. Dowdle returns today for follow-up. He has done well with discontinuing his beta blocker. Heart rate is now improved up to 58. He remains in atrial fibrillation. He does not feel quite as fatigued but does notice some fatigue. He seems to be tolerating Eliquis without any bleeding problems. I did review his recent endoleak with Dr. Myra Gianotti who felt it was okay to start Eliquis in the setting of recent EVAR. At this point the next step would be to try to obtain a normal sinus rhythm. He will need to be anticoagulated for at least 3-4 weeks prior to cardioversion.  04/15/2016  Mr. Heinke returns today for follow-up. He underwent outpatient cardioversion by myself which was unsuccessful. I therefore arranged for him to go on Tikosyn therapy. He was admitted and loaded on 500 g twice daily of Tikosyn and required cardioversion to get to sinus rhythm. This was successful and he has maintained sinus rhythm since then. He was discharged and followed up in the A. fib clinic. He was noted to have some hypokalemia and is due for repeat check of his potassium and magnesium. It was recommended that he have EKGs every 3 months and repeat of potassium and magnesium as per prescribing guidelines. He recently was seen in the emergency department for a "stabbing" mid upper back pain. This was felt to be reproducible and musculoskeletal. He did have a CT scan which ruled out dissection. He was given muscle relaxants and noted improvement with that and Tylenol.  07/31/2016  Mr. Nigg returns today for follow-up. Overall he seems to be doing  well. EKG shows sinus rhythm today. QTC of 469 ms. He is on Tikosyn 500 g twice a day. He is potassium 2 days ago was 4.6 however magnesium was slightly low at 1.9. He is on magnesium oxide 400 mg daily. He denies any chest pain or worsening shortness of breath. There has been about a 6 pound weight gain however he reports has not been as active. He recently had bilateral cataract surgery for which she is recovering. He also saw Dr. Myra Gianotti recently, who noted that he had a small endoleak from his AAA repair but he will continue to monitor.  02/16/2017  Mr. Becken returns today for follow-up. He seems to be doing really well. His echo recent showed improvement in LV function with an EF increased to 45-50%. He denies any recurrent atrial fibrillation. He appears to be in either a low atrial accelerated junctional rhythm today is 69. QTC is 467 ms. He is on dofetilide 500 g twice a day. He also takes Eliquis and denies any bleeding problems. He's scheduled for follow-up with Dr. Myra Gianotti for an endoleak of his AAA repair. 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  09/02/2017  Mr. Neil Crouch was seen today in follow-up.  He denies any new symptoms.  He seems to be maintaining sinus on dofetilide.  His QTC today is 490 ms.  He had lab work in January which showed normal creatinine of 1.09.  Total cholesterol was 91 with LDL of 17.  Triglycerides were elevated to 21.  Hemoglobin A1c 6.5.  He continues to follow with Dr. Myra Gianotti as well as interventional radiology for an endoleak of his prior AAA repair.  He is on apixaban for stroke risk reduction.  He denies chest pain or worsening shortness of breath.  03/29/2019  Mr. Neil Crouch returns today for follow-up.  He was last seen in April via virtual visit.  He was without complaints.  Eventually, interventional radiology was able to stop an endoleak of his aortic graft.   Unfortunately his developed some anemia and was noted to be iron deficient on recent labs with an iron level of 10.  Otherwise H&H was 12.5 and 40.  His creatinine was normal.  His liver enzymes were normal.  His hemoglobin A1c was 6.6 and stable.  Cholesterol showed total of 72, triglycerides 136, HDL 28 and LDL of 20.  Vitamin B12 number was normal at 866 however he was recently placed on repletion.  Overall he feels well.  An EKG was performed today to monitor his Tikosyn.  The QTC is prolonged today at 547 ms, up from 4 to 90 ms during his last EKG.  There have been no new medication changes.  He is on potassium and magnesium supplements.  He is maintaining sinus rhythm.  10/10/2019  Mr. Neil Crouch is seen today for follow-up.  Overall he seems to be doing well without complaints.  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  He denies any chest pain or shortness of breath.  He recently followed up with Dr. Myra Gianotti for his AAA repair which has been stable if not improving.  He had some recent lipids done which showed excellent control of his cholesterol.  His LDL was 20 and a repeat just a few weeks ago was 28.  This is probably more treatment than he needs.  Blood pressure is well controlled today.  EKG shows he is maintaining sinus rhythm with first-degree AV block.  He does have some PVCs however he is asymptomatic with this.  QTC was 473 ms he remains on dofetilide 250 mcg twice daily.  04/11/2020  Mr. Neil Crouch returns today for follow-up.  Overall he is doing well without any new complaints.  He reports his AAA repair is stable without active endoleak.  His blood pressure has been well controlled.  He has no chest pain or worsening shortness of breath.  He denies palpitations or recurrent A. fib.  EKG today shows an ectopic atrial rhythm but the QTC is prolonged at 520 ms.  This is new for him.  He does take dofetilide 250 mcg twice daily  and Eliquis which she is tolerating well.  He is also on supplemental potassium and magnesium.  Those will need to be reassessed.  11/18/2021  Mr. Neil Crouch is seen today in follow-up.  Overall he says he is doing very well.  He has been struggling with some right knee pain and that has improved significantly with artificial collagen injections.  He denies any chest pain or worsening shortness of breath.  He has followed up with Dr. Myra Gianotti but is overdue for repeat CT scan.  It is felt that his endovascular leak from AAA repair has healed.  He remains on dofetilide.  He had recent labs through his primary care provider.  This was in May 2023.  A1c was 7.1%.  Total cholesterol 88, HDL 35, triglycerides 129 and LDL 27.  Liver enzymes were normal.  Creatinine 1.11.  Potassium 4.8.  EKG personally reviewed today shows a sinus rhythm with sinus arrhythmia first-degree AV block and QTc 453 ms.  PMHx:  Past Medical History:  Diagnosis Date   Aneurysm of infrarenal abdominal aorta (HCC)    a. 01/2016 s/p endovascular repair of AAA (EVAR).   Arthritis    Chronic combined systolic and diastolic CHF (congestive heart failure) (HCC)    a. 01/2016 Echo: EF 35-40%, diff HK, mildly dil Ao root, mild MR, mildly dil LA.   Coronary artery disease    a. 1999 s/p CABG x4 (LIMA->LAD, VG->OM, VG->Diag, VG->RCA); b. 11/2012 Inferior STEMI/PCI: LIMA->LAD ok, VG->Diag 100, VG->OM 100, VG->RCA was culprit (PTCA/thrombectomy->Xience Xpedition DES), EF 25%.   GERD (gastroesophageal reflux disease)    High triglycerides    Hypertension    Ischemic Cardiomyopathy    a. 01/2016 Echo: EF 35-40%, diff HK, inflat, inf AK.   Obesity    Persistent atrial fibrillation (HCC)    a. Dx 01/2016-->slow vent response on beta blocker;  b. CHA2DS2VASc = 6-->Eliquis;  c. 03/03/2016 unsuccessful DCCV.   Ruptured lumbar disc    ST elevation myocardial infarction (STEMI) of inferior wall (HCC) 12/18/12   STEMI of inf. wall w/PCI with Xperdition  stent to VG to distal RCA   Type II diabetes mellitus (HCC)     Past Surgical History:  Procedure Laterality Date   BACK SURGERY     (5) back surgeries   CARDIOVERSION N/A 03/03/2016   Procedure: CARDIOVERSION;  Surgeon: Chrystie Nose, MD;  Location: [facility] ENDOSCOPY;  Service: Cardiovascular;  Laterality: N/A;   CARDIOVERSION N/A 03/12/2016   Procedure: CARDIOVERSION;  Surgeon: Chilton Si, MD;  Location: [facility] ENDOSCOPY;  Service: Cardiovascular;  Laterality: N/A;   CATARACT EXTRACTION, BILATERAL Right 07/10/2016   left eye 07/24/16   CORONARY ARTERY BYPASS GRAFT  1999   (CAD) CABG was a 5-vessel bypass and had a questionable history of A fib. He underwent monitoring whick showed sinus bradycardia and PACs but no evidence of A fib.   ENDOVASCULAR STENT INSERTION N/A 12/28/2015   Procedure: ENDOVASCULAR STENT GRAFT INSERTION;  Surgeon: Nada Libman, MD;  Location: [facility] OR;  Service: Vascular;  Laterality: N/A;   IR ANGIOGRAM PELVIS SELECTIVE OR SUPRASELECTIVE  04/30/2018   IR ANGIOGRAM SELECTIVE EACH ADDITIONAL VESSEL  04/30/2018   IR ANGIOGRAM VISCERAL SELECTIVE  04/30/2018   IR EMBO ARTERIAL NOT HEMORR HEMANG INC GUIDE ROADMAPPING  04/30/2018   IR RADIOLOGIST EVAL & MGMT  03/24/2017   IR RADIOLOGIST EVAL & MGMT  04/14/2018   IR RADIOLOGIST EVAL & MGMT  07/08/2018   IR RADIOLOGIST EVAL & MGMT  05/11/2019   IR RADIOLOGIST EVAL & MGMT  05/31/2020   IR US GUIDE VASC ACCESS RIGHT  04/30/2018   LEFT HEART CATHETERIZATION WITH CORONARY/GRAFT ANGIOGRAM  12/18/2012   Procedure: LEFT HEART CATHETERIZATION WITH Isabel Caprice;  Surgeon: [name], MD;  Location: [facility] CATH LAB;  Service: Cardiovascular;;   LUMBAR DISC SURGERY     open heart surgery  03/1998   TONSILLECTOMY      FAMHx:  Family History  Problem Relation Age of Onset   CAD Mother    Heart disease Mother    CAD Brother    Heart attack Brother    Hyperlipidemia Sister    Leukemia Father    CAD Maternal Grandmother     Brain cancer Brother    CAD Sister     SOCHx:   reports that he quit smoking about 34 years ago. His smoking use included cigarettes. He has a 135.00 pack-year smoking history. He has quit using smokeless tobacco.  His smokeless tobacco use included chew. He reports that he does not drink alcohol and does not use drugs.  ALLERGIES:  Allergies  Allergen Reactions   Other Other (See Comments)    Pain medication that he was given after open heart surgery-sweating and hallucinations (possibly percocet)   Cholecalciferol [Vitamin D-3] Other (See Comments)    CAUSES BOILS    Fish Oil Hives   Aspirin Nausea And Vomiting    Can't take full strength uncoated aspirin   Oxycodone Other (See Comments)    Hallucinations    ROS: Pertinent items noted in HPI and remainder of comprehensive ROS otherwise negative.  HOME MEDS: Current Outpatient Medications  Medication Sig Dispense Refill   acetaminophen (TYLENOL) 500 MG tablet Take 500 mg by mouth every 6 (six) hours as needed for mild pain.     albuterol (PROVENTIL HFA;VENTOLIN HFA) 108 (90 Base) MCG/ACT inhaler Inhale 1 puff into the lungs every 6 (six) hours as needed for wheezing or shortness of breath.     aspirin EC 81 MG EC tablet Take 1 tablet (81 mg total) by mouth daily.     atorvastatin (LIPITOR) 40 MG tablet TAKE 1 TABLET BY MOUTH DAILY AT 6 PM. 90 tablet 0   carboxymethylcellulose (REFRESH PLUS) 0.5 % SOLN Place 1 drop into both eyes 3 (three) times daily as needed (DRY EYES).     Carboxymethylcellulose Sod PF (REFRESH PLUS) 0.5 % SOLN Apply to eye.     dofetilide (TIKOSYN) 250 MCG capsule TAKE 1 CAPSULE BY MOUTH 2 TIMES DAILY. 180 capsule 1   ELIQUIS 5 MG TABS tablet TAKE 1 TABLET BY MOUTH TWICE A DAY 180 tablet 1   Flaxseed, Linseed, (FLAX SEED OIL PO) Take 2 capsules by mouth 2 (two) times daily.     furosemide (LASIX) 20 MG tablet TAKE 1 TABLET (20 MG TOTAL) BY MOUTH DAILY. SCHEDULE AN APPOINTMENT FOR FURTHER REFILLS, 1ST ATTEMPT  30 tablet 1   isosorbide mononitrate (IMDUR) 30 MG 24 hr tablet TAKE 1 TABLET BY MOUTH EVERY DAY 90 tablet 3   lisinopril (ZESTRIL) 5 MG tablet TAKE 1 TABLET (5 MG TOTAL) BY MOUTH DAILY. 90 tablet 3   LORazepam (ATIVAN) 0.5 MG tablet Take 0.5 mg by mouth 2 (two) times daily.     Magnesium 400 MG CAPS Take 400 mg by mouth 2 (two) times daily. 60 capsule 5   metFORMIN (GLUCOPHAGE-XR) 750 MG 24 hr tablet Take 375 tablets by mouth daily.   1   methocarbamol (ROBAXIN) 500 MG tablet Take 1 tablet (500 mg total) by mouth 2 (two) times daily. 20 tablet 0   metoprolol tartrate (LOPRESSOR) 25 MG tablet 0.5 tablet     nitroGLYCERIN (NITROSTAT) 0.4 MG SL tablet Place 1 tablet (0.4 mg total) under the tongue every 5 (five) minutes as needed for chest pain. 25 tablet 3   potassium chloride SA (KLOR-CON M20)  20 MEQ tablet TAKE 1.5 TABLETS (30 MEQ TOTAL) BY MOUTH DAILY. 135 tablet 1   ranitidine (ZANTAC) 150 MG tablet Take 150 mg by mouth 2 (two) times daily.     No current facility-administered medications for this visit.    LABS/IMAGING: No results found for this or any previous visit (from the past 48 hour(s)). No results found.  VITALS: BP 128/84   Pulse 72   Ht 6' (1.829 m)   Wt 287 lb (130.2 kg)   SpO2 95%   BMI 38.92 kg/m   EXAM: General appearance: alert and no distress Neck: no carotid bruit, no JVD and thyroid not enlarged, symmetric, no tenderness/mass/nodules Lungs: clear to auscultation bilaterally Heart: regular rate and rhythm, S1, S2 normal, no murmur, click, rub or gallop Abdomen: soft, non-tender; bowel sounds normal; no masses,  no organomegaly Extremities: extremities normal, atraumatic, no cyanosis or edema Pulses: 2+ and symmetric Skin: Skin color, texture, turgor normal. No rashes or lesions Neurologic: Grossly normal Psych: Pleasant  EKG: Ectopic atrial rhythm with first-degree AV block at 72, nonspecific T wave changes, QTc 453 ms-personally  reviewed  ASSESSMENT: Atrial fibrillation with slow ventricular response-CHADSVASC score of 5 Chronic Tikosyn therapy S/p AAA status post -EVAR with type II endoleak Coronary artery disease status post CABG with ST elevation MI and stent placement to the vein graft to PDA, with occluded grafts to the OM and diagonal vessels and a patent LIMA to LAD Ischemic cardiac myopathy EF 45% (improved to 45-50% by echo in 01/2017), prior inferior infact Hypertension Dyslipidemia  PLAN: 1.   Mr. Neil Crouch denies any recurrent A-fib.  He appears to be again in an ectopic atrial rhythm with a normal QTc on moderate dose dofetilide.  Recent labs were well controlled.  His LDL cholesterol is very low at 27.  He denies any worsening shortness of breath or chest pain.  His last echo showed EF 45 to 50% but has not been repeated due to lack of symptoms.  He is due for follow-up with Dr. Myra Gianotti to reassess his endoleak and have a repeat CT scan.  I encouraged him to make that appointment.  Blood pressure is well controlled.  Overall he is doing very well.  We will plan annual follow-up or sooner as necessary.  Chrystie Nose, MD, [facility], FACP  [place]  [facility]  Medical Director of the [facility] Lipid Disorders &  Cardiovascular Risk Reduction Clinic Diplomate of the American Board of Clinical Lipidology Attending Cardiologist  Direct Dial: [phone]  Fax: [phone]  Website:  [email or web address] Nicely Jaziyah Gradel 11/18/2021, 10:04 AM

## 2021-11-21 ENCOUNTER — Other Ambulatory Visit (HOSPITAL_COMMUNITY): Payer: Self-pay | Admitting: Internal Medicine

## 2021-11-22 ENCOUNTER — Other Ambulatory Visit: Payer: Self-pay | Admitting: Internal Medicine

## 2021-11-30 ENCOUNTER — Other Ambulatory Visit: Payer: Self-pay | Admitting: Internal Medicine

## 2021-12-09 ENCOUNTER — Other Ambulatory Visit: Payer: Self-pay | Admitting: *Deleted

## 2021-12-09 DIAGNOSIS — I6522 Occlusion and stenosis of left carotid artery: Secondary | ICD-10-CM

## 2021-12-30 ENCOUNTER — Ambulatory Visit: Payer: Medicare Other | Admitting: Surgery

## 2021-12-30 ENCOUNTER — Other Ambulatory Visit: Payer: Self-pay | Admitting: Surgery

## 2021-12-30 ENCOUNTER — Encounter (HOSPITAL_COMMUNITY): Payer: Medicare Other

## 2021-12-30 DIAGNOSIS — I9789 Other postprocedural complications and disorders of the circulatory system, not elsewhere classified: Secondary | ICD-10-CM

## 2022-01-10 ENCOUNTER — Ambulatory Visit
Admission: RE | Admit: 2022-01-10 | Discharge: 2022-01-10 | Disposition: A | Discharge status: Home / Self Care | Payer: Medicare Other | Source: Ambulatory Visit | Attending: Interventional Radiology | Admitting: Interventional Radiology

## 2022-01-10 ENCOUNTER — Ambulatory Visit
Admission: RE | Admit: 2022-01-10 | Discharge: 2022-01-10 | Disposition: A | Discharge status: Home / Self Care | Payer: Medicare Other | Source: Ambulatory Visit | Attending: Surgery | Admitting: Surgery

## 2022-01-10 DIAGNOSIS — I723 Aneurysm of iliac artery: Secondary | ICD-10-CM | POA: Diagnosis not present

## 2022-01-10 DIAGNOSIS — I9789 Other postprocedural complications and disorders of the circulatory system, not elsewhere classified: Secondary | ICD-10-CM

## 2022-01-10 DIAGNOSIS — K4020 Bilateral inguinal hernia, without obstruction or gangrene, not specified as recurrent: Secondary | ICD-10-CM | POA: Diagnosis not present

## 2022-01-10 DIAGNOSIS — I714 Abdominal aortic aneurysm, without rupture, unspecified: Secondary | ICD-10-CM | POA: Diagnosis not present

## 2022-01-10 HISTORY — PX: IR RADIOLOGIST EVAL & MGMT: IMG5224

## 2022-01-10 MED ORDER — IOPAMIDOL (ISOVUE-370) INJECTION 76%
80.0000 mL | Freq: Once | INTRAVENOUS | Status: AC | PRN
  Administered 2022-01-10: 80 mL via INTRAVENOUS

## 2022-01-10 NOTE — Progress Notes (Signed)
Chief Complaint: Patient was seen in consultation today for follow up after endoleak repair.  History of Present Illness: Ryan Wise is a 81 y.o. 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  Imaging after endoleak repair has demonstrated resolution of any visible endoleak by CTA but suggestion of slight increase in aneurysm sac size.  The prior CTA demonstrated opacification of a right-sided L3 lumbar artery without clear supply of an endoleak.  He remains asymptomatic with no abdominal pain.  He is still very active.  He denies any claudication symptoms.  Past Medical History:  Diagnosis Date   Aneurysm of infrarenal abdominal aorta ([place])    a. 01/2016 s/p endovascular repair of AAA (EVAR).   Arthritis    Chronic combined systolic and diastolic CHF (congestive heart failure) ([place])    a. 01/2016 Echo: EF 35-40%, diff HK, mildly dil Ao root, mild MR, mildly dil LA.   Coronary artery disease    a. 1999 s/p CABG x4 (LIMA->LAD, VG->OM, VG->Diag, VG->RCA); b. 11/2012 Inferior STEMI/PCI: LIMA->LAD ok, VG->Diag 100, VG->OM 100, VG->RCA was culprit (PTCA/thrombectomy->Xience Xpedition DES), EF 25%.   GERD (gastroesophageal reflux disease)    High triglycerides    Hypertension    Ischemic Cardiomyopathy    a. 01/2016 Echo: EF 35-40%, diff HK, inflat, inf AK.   Obesity    Persistent atrial fibrillation ([place])    a. Dx 01/2016-->slow vent response on beta blocker;  b. CHA2DS2VASc = 6-->Eliquis;  c. 03/03/2016 unsuccessful DCCV.   Ruptured lumbar disc    ST elevation myocardial infarction (STEMI) of inferior wall ([place]) 12/18/12   STEMI of inf. wall w/PCI with Xperdition stent to VG to distal RCA   Type II diabetes mellitus ([place])     Past Surgical History:  Procedure Laterality Date   BACK SURGERY     (5) back surgeries   CARDIOVERSION N/A 03/03/2016   Procedure: CARDIOVERSION;   Surgeon: Pixie Casino, MD;  Location[facility] ENDOSCOPY;  Service: Cardiovascular;  Laterality: N/A;   CARDIOVERSION N/A 03/12/2016   Procedure: CARDIOVERSION;  Surgeon: Skeet Latch, MD;  Location: [place];  Service: Cardiovascular;  Laterality: N/A;   CATARACT EXTRACTION, BILATERAL Right 07/10/2016   left eye 07/24/16   CORONARY ARTERY BYPASS GRAFT  1999   (CAD) CABG was a 5-vessel bypass and had a questionable history of A fib. He underwent monitoring whick showed sinus bradycardia and PACs but no evidence of A fib.   ENDOVASCULAR STENT INSERTION N/A 12/28/2015   Procedure: ENDOVASCULAR STENT GRAFT INSERTION;  Surgeon: Serafina Mitchell, MD;  Location: [place];  Service: Vascular;  Laterality: N/A;   IR ANGIOGRAM PELVIS SELECTIVE OR SUPRASELECTIVE  04/30/2018   IR ANGIOGRAM SELECTIVE EACH ADDITIONAL VESSEL  04/30/2018   IR ANGIOGRAM VISCERAL SELECTIVE  04/30/2018   IR EMBO ARTERIAL NOT HEMORR HEMANG INC GUIDE ROADMAPPING  04/30/2018   IR RADIOLOGIST EVAL & MGMT  03/24/2017   IR RADIOLOGIST EVAL & MGMT  04/14/2018   IR RADIOLOGIST EVAL & MGMT  07/08/2018   IR RADIOLOGIST EVAL & MGMT  05/11/2019   IR RADIOLOGIST EVAL & MGMT  05/31/2020   IR US GUIDE VASC ACCESS RIGHT  04/30/2018   LEFT HEART CATHETERIZATION WITH CORONARY/GRAFT ANGIOGRAM  12/18/2012   Procedure: LEFT HEART CATHETERIZATION WITH Beatrix Fetters;  Surgeon: Troy Sine, MD;  Location: [facility] CATH LAB;  Service: Cardiovascular;;   LUMBAR DISC SURGERY  open heart surgery  03/1998   TONSILLECTOMY      Allergies: Other, Cholecalciferol [vitamin d-3], Fish oil, Aspirin, and Oxycodone  Medications: Prior to Admission medications   Medication Sig Start Date End Date Taking? Authorizing Provider  acetaminophen (TYLENOL) 500 MG tablet Take 500 mg by mouth every 6 (six) hours as needed for mild pain.    [provider]  albuterol (PROVENTIL HFA;VENTOLIN HFA) 108 (90 Base) MCG/ACT inhaler Inhale 1 puff into the lungs  every 6 (six) hours as needed for wheezing or shortness of breath.    [provider]  aspirin EC 81 MG EC tablet Take 1 tablet (81 mg total) by mouth daily. 12/22/12   Erlene Quan, PA-C  atorvastatin (LIPITOR) 40 MG tablet TAKE 1 TABLET BY MOUTH DAILY AT 6 PM. 11/22/21   Hilty, Nadean Corwin, MD  carboxymethylcellulose (REFRESH PLUS) 0.5 % SOLN Place 1 drop into both eyes 3 (three) times daily as needed (DRY EYES).    [provider]  Carboxymethylcellulose Sod PF (REFRESH PLUS) 0.5 % SOLN Apply to eye.    [provider]  dofetilide (TIKOSYN) 250 MCG capsule TAKE 1 CAPSULE BY MOUTH 2 TIMES DAILY. 06/26/21   Hilty, Nadean Corwin, MD  ELIQUIS 5 MG TABS tablet TAKE 1 TABLET BY MOUTH TWICE A DAY 10/02/21   Hilty, Nadean Corwin, MD  Flaxseed, Linseed, (FLAX SEED OIL PO) Take 2 capsules by mouth 2 (two) times daily.    [provider]  furosemide (LASIX) 20 MG tablet Take 1 tablet (20 mg total) by mouth daily. 12/02/21   Hilty, Nadean Corwin, MD  isosorbide mononitrate (IMDUR) 30 MG 24 hr tablet TAKE 1 TABLET BY MOUTH EVERY DAY 11/22/21   Hilty, Nadean Corwin, MD  lisinopril (ZESTRIL) 5 MG tablet TAKE 1 TABLET (5 MG TOTAL) BY MOUTH DAILY. 11/19/21   Hilty, Nadean Corwin, MD  LORazepam (ATIVAN) 0.5 MG tablet Take 0.5 mg by mouth 2 (two) times daily. 01/07/13   Pixie Casino, MD  Magnesium 400 MG CAPS Take 400 mg by mouth 2 (two) times daily. 07/31/16   Hilty, Nadean Corwin, MD  metFORMIN (GLUCOPHAGE-XR) 750 MG 24 hr tablet Take 375 tablets by mouth daily.  11/23/15   [provider]  methocarbamol (ROBAXIN) 500 MG tablet Take 1 tablet (500 mg total) by mouth 2 (two) times daily. 06/21/18   Hedges, Dellis Filbert, PA-C  metoprolol tartrate (LOPRESSOR) 25 MG tablet 0.5 tablet    [provider]  nitroGLYCERIN (NITROSTAT) 0.4 MG SL tablet Place 1 tablet (0.4 mg total) under the tongue every 5 (five) minutes as needed for chest pain. 12/22/12   Erlene Quan, PA-C  potassium chloride SA (KLOR-CON  M20) 20 MEQ tablet TAKE 1.5 TABLETS (30 MEQ TOTAL) BY MOUTH DAILY. 06/26/21   Hilty, Nadean Corwin, MD  ranitidine (ZANTAC) 150 MG tablet Take 150 mg by mouth 2 (two) times daily.    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  Tobacco Use   Smoking status: Former    Packs/day: 4.50    Years: 30.00    Total pack years: 135.00    Types: Cigarettes    Quit date: 05/07/1987    Years since quitting: 34.7   Smokeless tobacco: Former    Types: Nurse, children's Use: Never used  Substance and Sexual Activity   Alcohol use: No   Drug use: No   Sexual activity: Not on file  Other Topics Concern   Not on file  Social History Narrative   Not on file   Social Determinants of Health   Financial Resource Strain: Not on file  Food Insecurity: Not on file  Transportation Needs: Not on file  Physical Activity: Not on file  Stress: Not on file  Social Connections: Not on file    Review of Systems: A 12 point ROS discussed and pertinent positives are indicated in the HPI above.  All other systems are negative.  Review of Systems  Constitutional: Negative.   Respiratory: Negative.    Cardiovascular: Negative.   Gastrointestinal: Negative.   Genitourinary: Negative.   Musculoskeletal: Negative.   Neurological: Negative.     Vital Signs: There were no vitals taken for this visit.   Physical Exam Constitutional:      General: He is not in acute distress.    Appearance: Normal  appearance. He is not ill-appearing or toxic-appearing.  Abdominal:     General: There is no distension.     Palpations: Abdomen is soft.     Tenderness: There is no abdominal tenderness. There is no guarding or rebound.  Musculoskeletal:        General: No swelling.  Skin:    General: Skin is warm and dry.  Neurological:     General: No focal deficit present.     Mental Status: He is alert and oriented to person, place, and time.      Imaging: CT ANGIO ABDOMEN PELVIS  W &/OR WO CONTRAST  Result Date: 01/10/2022 CLINICAL DATA:  Status post EVAR in 2017 and additional transcatheter embolization of a type 2 aortic endoleak supplied by the IMA on 04/30/2018. EXAM: CT ANGIOGRAPHY ABDOMEN AND PELVIS WITH CONTRAST TECHNIQUE: Multidetector CT imaging of the abdomen and pelvis was performed using the standard protocol during bolus administration of intravenous contrast. Multiplanar reconstructed images and MIPs were obtained and reviewed to evaluate the vascular anatomy. RADIATION DOSE REDUCTION: This exam was performed according to the departmental dose-optimization program which includes automated exposure control, adjustment of the mA and/or kV according to patient size and/or use of iterative reconstruction technique. CONTRAST:  51m ISOVUE-370 IOPAMIDOL (ISOVUE-370) INJECTION 76% COMPARISON:  Multiple prior studies with the most recent dated 05/14/2020 FINDINGS: VASCULAR Aorta: Aortic endograft demonstrates stable patency. Aortic aneurysm sac demonstrates slight enlargement since the prior study with maximal dimensions of approximately 9.2 x 9.5 cm compared to approximately 8.9 x 9.2 cm at a comparable level. There is no visible clear endoleak by CTA on the current study. Again noted is opacification of a right-sided L3 lumbar artery which approaches the posterior aspect of the aneurysm sac but does not result in clear adjacent opacification of the aneurysm sac. General density measurements within  the aneurysm sac do show some increase in density between arterial and venous phases potentially indicating a mild/slow endoleak  that is not well visualized by CTA. Celiac: Stable non-critical irregular proximal stenosis. SMA: Normally patent. Renals: Single left and paired right renal arteries again demonstrate no significant stenosis. IMA: Occluded IMA trunk by previously placed embolization coils. Inflow: Slight increased caliber of distal left common iliac artery aneurysm measuring 3 cm in maximum diameter compared to 2.8 cm previously. Stable aneurysmal dilatation of the left internal iliac trunk measuring approximately 2.3 cm in greatest diameter. Proximal Outflow: Normally patent bilateral common femoral arteries and femoral bifurcations. Veins: No venous abnormalities on delayed imaging. Review of the MIP images confirms the above findings. NON-VASCULAR Lower chest: No acute abnormality. Hepatobiliary: No focal liver abnormality is seen. No gallstones, gallbladder wall thickening, or biliary dilatation. Pancreas: Unremarkable. No pancreatic ductal dilatation or surrounding inflammatory changes. Spleen: No splenic injury or perisplenic hematoma. Adrenals/Urinary Tract: Adrenal glands are unremarkable. Kidneys are normal, without renal calculi, focal lesion, or hydronephrosis. Bladder is unremarkable. Stomach/Bowel: Bowel shows no evidence of obstruction, ileus, inflammation or lesion. The appendix is normal. No free intraperitoneal air. Lymphatic: No enlarged abdominal or pelvic lymph nodes. Reproductive: Prostate is unremarkable. Other: Stable small bilateral inguinal hernias containing fat. No abnormal fluid collections. Musculoskeletal: No acute or significant osseous findings. IMPRESSION: 1. Aortic aneurysm sac demonstrates slight enlargement since the prior study with maximal dimensions of approximately 9.2 x 9.5 cm compared to approximately 8.9 x 9.2 cm at a comparable level. There is no visible clear  endoleak by CTA on the current study. Again noted is opacification of a right-sided L3 lumbar artery which approaches the posterior aspect of the aneurysm sac but does not result in clear adjacent opacification of the aneurysm sac. General density measurements within the aneurysm sac do show some increase in density between arterial and venous phases potentially indicating a mild/slow endoleak that is not well visualized by CTA. 2. Increase in caliber of aneurysmal disease involving the native distal left common iliac artery now measuring approximately 3 cm compared to 2.8 cm previously and 2.6 cm in 2020. 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 Electronically Signed   By: Aletta Edouard M.D.   On: 01/10/2022 16:12    Labs:  CBC: No results for input(s): "WBC", "HGB", "HCT", "PLT" in the last 8760 hours.  COAGS: No results for input(s): "INR", "APTT" in the last 8760 hours.  BMP: No results for input(s): "NA", "K", "CL", "CO2", "GLUCOSE", "BUN", "CALCIUM", "CREATININE", "GFRNONAA", "GFRAA" in the last 8760 hours.  Invalid input(s): "CMP"   Assessment and Plan:  I met with Mr. Warshawsky and his daughter Sharyn Lull.  A CTA of the abdomen and pelvis was performed just prior to the office visit today. This demonstrates slight enlargement of the aneurysm sac surrounding the aortic endograft since the prior CTA on 05/14/2020.  Aneurysm sac dimensions have increased from approximately 8.9 x 9.2 cm to 9.2 x 9.5 cm representing roughly 3 mm of growth.  A definite endoleak is not identified by CTA.  The aneurysm sac does increase in density between arterial and venous phases.  Retrograde opacification of a right L3 lumbar artery is again noted on the arterial phase but this does not clearly supply a focus of sac perfusion on the arterial or venous phases.  I told Mr.  Vangieson and his daughter that it is conceivable that there is a slow endoleak present causing gradual sac  enlargement.  Arteriographic identification of an endoleak may be very difficult.  However, the arteriogram in 2019 did demonstrate what appeared to be some retrograde opacification of a right-sided L3 or L4 lumbar artery which did not appear to supply an endoleak at that time.  Mr. Gracie is scheduled to follow-up with Dr. Trula Slade next month.  I told him that I would not be inclined currently to recommend arteriography given CTA results and would only pursue arteriography if there was a more clearly demonstrated type II endoleak by CTA.  Any further enlargement of aneurysm sac would definitely be of concern.  There was an 93-monthinterval between the last 2 scans and I recommended that we perform another CTA in approximately 12 months.   Electronically Signed: GAzzie Roup8/18/2023, 4:36 PM    I spent a total of 15 Minutes in face to face in clinical consultation, greater than 50% of which was counseling/coordinating care for aortic endoleak.

## 2022-01-20 ENCOUNTER — Ambulatory Visit: Payer: Medicare Other | Admitting: Surgery

## 2022-01-20 ENCOUNTER — Other Ambulatory Visit (HOSPITAL_COMMUNITY): Payer: Self-pay | Admitting: Internal Medicine

## 2022-01-22 ENCOUNTER — Other Ambulatory Visit: Payer: Self-pay | Admitting: *Deleted

## 2022-01-22 DIAGNOSIS — I6522 Occlusion and stenosis of left carotid artery: Secondary | ICD-10-CM

## 2022-01-24 ENCOUNTER — Other Ambulatory Visit: Payer: Self-pay | Admitting: Internal Medicine

## 2022-01-24 ENCOUNTER — Encounter (HOSPITAL_BASED_OUTPATIENT_CLINIC_OR_DEPARTMENT_OTHER): Payer: Self-pay | Admitting: Internal Medicine

## 2022-01-24 DIAGNOSIS — I4819 Other persistent atrial fibrillation: Secondary | ICD-10-CM

## 2022-01-24 NOTE — Telephone Encounter (Signed)
Prescription refill request for Eliquis received. Indication: Afib  Last office visit:11/18/21 ([facility])  Scr: 1.15 (07/15/21)  Age: 81 Weight: 130.2kg   Appropriate dose and refill sent to requested pharmacy.

## 2022-02-03 ENCOUNTER — Ambulatory Visit: Payer: Medicare Other | Admitting: Surgery

## 2022-02-03 ENCOUNTER — Encounter: Payer: Self-pay | Admitting: Surgery

## 2022-02-03 ENCOUNTER — Ambulatory Visit (HOSPITAL_COMMUNITY)
Admission: RE | Admit: 2022-02-03 | Discharge: 2022-02-03 | Disposition: A | Discharge status: Home / Self Care | Payer: Medicare Other | Source: Ambulatory Visit | Attending: Surgery | Admitting: Surgery

## 2022-02-03 ENCOUNTER — Ambulatory Visit (INDEPENDENT_AMBULATORY_CARE_PROVIDER_SITE_OTHER): Payer: Medicare Other | Admitting: Surgery

## 2022-02-03 VITALS — BP 129/88 | HR 83 | Temp 98.3°F | Resp 20 | Ht 72.0 in | Wt 288.0 lb

## 2022-02-03 DIAGNOSIS — I6522 Occlusion and stenosis of left carotid artery: Secondary | ICD-10-CM | POA: Diagnosis not present

## 2022-02-03 DIAGNOSIS — I7143 Infrarenal abdominal aortic aneurysm, without rupture: Secondary | ICD-10-CM

## 2022-02-03 NOTE — Progress Notes (Signed)
[facility]ist [facility]  Patient name: Ryan Wise MRN: [id number] DOB: Jul 16, 1940 Sex: male   REASON FOR VISIT:    Follow up  HISOTRY OF PRESENT ILLNESS:    Ryan Wise is a 81 y.o. male returns today for follow-up.  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  His postoperative course was uncomplicated.  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 Fredia Sorrow. He denies any abdominal pain.  We have been following a slight increase in the size of his aneurysm sac and possible endoleak.       He has a history of CAD, s/p CABG.  He is followed by Dr. Rennis Golden.  He is on a statin for hypercholesterolemia.  He takes an ASA.  He is medically managed for hypertension.  He is a former smoker.  He remains very active.  PAST MEDICAL HISTORY:   Past Medical History:  Diagnosis Date   Aneurysm of infrarenal abdominal aorta ([facility])    a. 01/2016 s/p endovascular repair of AAA (EVAR).   Arthritis    Chronic combined systolic and diastolic CHF (congestive heart failure) ([facility])    a. 01/2016 Echo: EF 35-40%, diff HK, mildly dil Ao root, mild MR, mildly dil LA.   Coronary artery disease    a. 1999 s/p CABG x4 (LIMA->LAD, VG->OM, VG->Diag, VG->RCA); b. 11/2012 Inferior STEMI/PCI: LIMA->LAD ok, VG->Diag 100, VG->OM 100, VG->RCA was culprit (PTCA/thrombectomy->Xience Xpedition DES), EF 25%.   GERD (gastroesophageal reflux disease)    High triglycerides    Hypertension    Ischemic Cardiomyopathy    a. 01/2016 Echo: EF 35-40%, diff HK, inflat, inf AK.   Obesity    Persistent atrial fibrillation ([facility])    a. Dx 01/2016-->slow vent response on beta blocker;  b. CHA2DS2VASc = 6-->Eliquis;  c. 03/03/2016 unsuccessful DCCV.   Ruptured lumbar disc    ST elevation  myocardial infarction (STEMI) of inferior wall ([facility]) 12/18/12   STEMI of inf. wall w/PCI with Xperdition stent to VG to distal RCA   Type II diabetes mellitus ([facility])      FAMILY HISTORY:   Family History  Problem Relation Age of Onset   CAD Mother    Heart disease Mother    CAD Brother    Heart attack Brother    Hyperlipidemia Sister    Leukemia Father    CAD Maternal Grandmother    Brain cancer Brother    CAD Sister     SOCIAL HISTORY:   Social History   Tobacco Use   Smoking status: Former    Packs/day: 4.50    Years: 30.00    Total pack years: 135.00    Types: Cigarettes    Quit date: 05/07/1987    Years since quitting: 34.7   Smokeless tobacco: Former    Types: Chew  Substance Use Topics   Alcohol use: No     ALLERGIES:   Allergies  Allergen Reactions   Other Other (See Comments)    Pain medication that he was given after open heart surgery-sweating and hallucinations (possibly percocet)   Cholecalciferol [Vitamin D-3] Other (See Comments)    CAUSES BOILS    Fish Oil Hives   Aspirin Nausea And Vomiting    Can't take full strength uncoated aspirin   Oxycodone Other (See Comments)  Hallucinations     CURRENT MEDICATIONS:   Current Outpatient Medications  Medication Sig Dispense Refill   acetaminophen (TYLENOL) 500 MG tablet Take 500 mg by mouth every 6 (six) hours as needed for mild pain.     albuterol (PROVENTIL HFA;VENTOLIN HFA) 108 (90 Base) MCG/ACT inhaler Inhale 1 puff into the lungs every 6 (six) hours as needed for wheezing or shortness of breath.     aspirin EC 81 MG EC tablet Take 1 tablet (81 mg total) by mouth daily.     atorvastatin (LIPITOR) 40 MG tablet TAKE 1 TABLET BY MOUTH DAILY AT 6 PM. 90 tablet 3   carboxymethylcellulose (REFRESH PLUS) 0.5 % SOLN Place 1 drop into both eyes 3 (three) times daily as needed (DRY EYES).     Carboxymethylcellulose Sod PF (REFRESH PLUS) 0.5 % SOLN Apply to eye.     dofetilide (TIKOSYN) 250 MCG capsule  TAKE 1 CAPSULE BY MOUTH 2 TIMES DAILY. 180 capsule 1   ELIQUIS 5 MG TABS tablet TAKE 1 TABLET BY MOUTH TWICE A DAY 180 tablet 1   Flaxseed, Linseed, (FLAX SEED OIL PO) Take 2 capsules by mouth 2 (two) times daily.     furosemide (LASIX) 20 MG tablet Take 1 tablet (20 mg total) by mouth daily. 90 tablet 3   isosorbide mononitrate (IMDUR) 30 MG 24 hr tablet TAKE 1 TABLET BY MOUTH EVERY DAY 90 tablet 3   lisinopril (ZESTRIL) 5 MG tablet TAKE 1 TABLET (5 MG TOTAL) BY MOUTH DAILY. 90 tablet 3   LORazepam (ATIVAN) 0.5 MG tablet Take 0.5 mg by mouth 2 (two) times daily.     Magnesium 400 MG CAPS Take 400 mg by mouth 2 (two) times daily. 60 capsule 5   metFORMIN (GLUCOPHAGE-XR) 750 MG 24 hr tablet 1 TABLET WITH EVENING MEAL ORALLY ONCE A DAY 90 90 tablet 3   methocarbamol (ROBAXIN) 500 MG tablet Take 1 tablet (500 mg total) by mouth 2 (two) times daily. 20 tablet 0   metoprolol tartrate (LOPRESSOR) 25 MG tablet 0.5 tablet     nitroGLYCERIN (NITROSTAT) 0.4 MG SL tablet Place 1 tablet (0.4 mg total) under the tongue every 5 (five) minutes as needed for chest pain. 25 tablet 3   potassium chloride SA (KLOR-CON M20) 20 MEQ tablet TAKE 1.5 TABLETS (30 MEQ TOTAL) BY MOUTH DAILY. 135 tablet 3   ranitidine (ZANTAC) 150 MG tablet Take 150 mg by mouth 2 (two) times daily.     No current facility-administered medications for this visit.    REVIEW OF SYSTEMS:   [X]  denotes positive finding, [ ]  denotes negative finding Cardiac  Comments:  Chest pain or chest pressure:    Shortness of breath upon exertion:    Short of breath when lying flat:    Irregular heart rhythm:        Vascular    Pain in calf, thigh, or hip brought on by ambulation:    Pain in feet at night that wakes you up from your sleep:     Blood clot in your veins:    Leg swelling:         Pulmonary    Oxygen at home:    Productive cough:     Wheezing:         Neurologic    Sudden weakness in arms or legs:     Sudden numbness in arms  or legs:     Sudden onset of difficulty speaking or slurred speech:  Temporary loss of vision in one eye:     Problems with dizziness:         Gastrointestinal    Blood in stool:     Vomited blood:         Genitourinary    Burning when urinating:     Blood in urine:        Psychiatric    Major depression:         Hematologic    Bleeding problems:    Problems with blood clotting too easily:        Skin    Rashes or ulcers:        Constitutional    Fever or chills:      PHYSICAL EXAM:   There were no vitals filed for this visit.  GENERAL: The patient is a well-nourished male, in no acute distress. The vital signs are documented above. CARDIAC: There is a regular rate and rhythm.  PULMONARY: Non-labored respirations ABDOMEN: Soft and non-tender  MUSCULOSKELETAL: There are no major deformities or cyanosis. NEUROLOGIC: No focal weakness or paresthesias are detected. SKIN: There are no ulcers or rashes noted. PSYCHIATRIC: The patient has a normal affect.  STUDIES:   I have reviewed the following:  Carotid duplex: Right Carotid: Velocities in the right ICA are consistent with a 1-39%  stenosis.   Left Carotid: Velocities in the left ICA are consistent with a 1-39%  stenosis.   Vertebrals:  Bilateral vertebral arteries demonstrate antegrade flow.  Subclavians: Normal flow hemodynamics were seen in bilateral subclavian               arteries.   CT angiogram:  1. Aortic aneurysm sac demonstrates slight enlargement since the prior study with maximal dimensions of approximately 9.2 x 9.5 cm compared to approximately 8.9 x 9.2 cm at a comparable level. There is no visible clear endoleak by CTA on the current study. Again noted is opacification of a right-sided L3 lumbar artery which approaches the posterior aspect of the aneurysm sac but does not result in clear adjacent opacification of the aneurysm sac. General density measurements within the aneurysm sac do  show some increase in density between arterial and venous phases potentially indicating a mild/slow endoleak that is not well visualized by CTA. 2. Increase in caliber of aneurysmal disease involving the native distal left common iliac artery now measuring approximately 3 cm compared to 2.8 cm previously and 2.6 cm in 2020. 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  MEDICAL ISSUES:   AAA: There has been a slight interval increase in size of his aneurysm.  Maximal diameter is 9.5 cm.  There is no obvious endoleak.  I discussed with the patient that I would not recommend intervention at this time as there is no definitive endoleak.  This likely means that this is a very small process.  Hopefully it will resolve on its own.  We talked about following this in 12 months with a repeat CT scan.  After further discussions they would like to do this in 6 months.  He knows to come to the emergency department immediately should he develop acute onset of abdominal or back pain.    Charlena Cross, MD, FACS [facility] [phone] Pager [phone]

## 2022-02-14 ENCOUNTER — Other Ambulatory Visit: Payer: Self-pay | Admitting: *Deleted

## 2022-02-14 MED ORDER — DOFETILIDE 250 MCG PO CAPS: 250.0000 ug | ORAL_CAPSULE | Freq: Two times a day (BID) | ORAL | 3 refills | Status: DC

## 2022-02-14 NOTE — Telephone Encounter (Signed)
Potassium refill was sent in on 01/22/2022. Per [facility] patient last picked up dofetilide RX in May for a 90 day supply.   Called and spoke to patient who states that he has been taking the dofetilide and still has some left. Rx for dofetilide sent to patient's preferred pharmacy.

## 2022-05-05 DIAGNOSIS — E785 Hyperlipidemia, unspecified: Secondary | ICD-10-CM | POA: Diagnosis not present

## 2022-05-05 DIAGNOSIS — E1122 Type 2 diabetes mellitus with diabetic chronic kidney disease: Secondary | ICD-10-CM | POA: Diagnosis not present

## 2022-05-05 DIAGNOSIS — I10 Essential (primary) hypertension: Secondary | ICD-10-CM | POA: Diagnosis not present

## 2022-05-05 DIAGNOSIS — N182 Chronic kidney disease, stage 2 (mild): Secondary | ICD-10-CM | POA: Diagnosis not present

## 2022-05-05 DIAGNOSIS — E559 Vitamin D deficiency, unspecified: Secondary | ICD-10-CM | POA: Diagnosis not present

## 2022-05-05 DIAGNOSIS — Z0000 Encounter for general adult medical examination without abnormal findings: Secondary | ICD-10-CM | POA: Diagnosis not present

## 2022-05-12 ENCOUNTER — Encounter (HOSPITAL_BASED_OUTPATIENT_CLINIC_OR_DEPARTMENT_OTHER): Payer: Self-pay | Admitting: Internal Medicine

## 2022-05-12 DIAGNOSIS — I2510 Atherosclerotic heart disease of native coronary artery without angina pectoris: Secondary | ICD-10-CM | POA: Diagnosis not present

## 2022-05-12 DIAGNOSIS — I5032 Chronic diastolic (congestive) heart failure: Secondary | ICD-10-CM | POA: Diagnosis not present

## 2022-05-12 DIAGNOSIS — Z23 Encounter for immunization: Secondary | ICD-10-CM | POA: Diagnosis not present

## 2022-05-12 DIAGNOSIS — E1122 Type 2 diabetes mellitus with diabetic chronic kidney disease: Secondary | ICD-10-CM | POA: Diagnosis not present

## 2022-05-12 DIAGNOSIS — Z0000 Encounter for general adult medical examination without abnormal findings: Secondary | ICD-10-CM | POA: Diagnosis not present

## 2022-05-12 DIAGNOSIS — I4891 Unspecified atrial fibrillation: Secondary | ICD-10-CM | POA: Diagnosis not present

## 2022-05-12 DIAGNOSIS — I129 Hypertensive chronic kidney disease with stage 1 through stage 4 chronic kidney disease, or unspecified chronic kidney disease: Secondary | ICD-10-CM | POA: Diagnosis not present

## 2022-05-12 DIAGNOSIS — N182 Chronic kidney disease, stage 2 (mild): Secondary | ICD-10-CM | POA: Diagnosis not present

## 2022-05-12 DIAGNOSIS — J449 Chronic obstructive pulmonary disease, unspecified: Secondary | ICD-10-CM | POA: Diagnosis not present

## 2022-05-12 DIAGNOSIS — D638 Anemia in other chronic diseases classified elsewhere: Secondary | ICD-10-CM | POA: Diagnosis not present

## 2022-05-12 DIAGNOSIS — E785 Hyperlipidemia, unspecified: Secondary | ICD-10-CM | POA: Diagnosis not present

## 2022-05-12 DIAGNOSIS — K219 Gastro-esophageal reflux disease without esophagitis: Secondary | ICD-10-CM | POA: Diagnosis not present

## 2022-07-03 ENCOUNTER — Encounter (HOSPITAL_COMMUNITY): Payer: Self-pay | Admitting: *Deleted

## 2022-10-16 ENCOUNTER — Other Ambulatory Visit: Payer: Self-pay | Admitting: Internal Medicine

## 2022-10-16 DIAGNOSIS — I4819 Other persistent atrial fibrillation: Secondary | ICD-10-CM

## 2022-10-16 NOTE — Telephone Encounter (Signed)
Eliquis 5mg  refill request received. Patient is 82 years old, weight-130.6kg, Crea-1.14 on 05/05/22 via scanned labs from [facility], [place], and last seen by Dr. Rennis Golden on 11/18/21 and pending appt on 02/25/23. Dose is appropriate based on dosing criteria. Will send in refill to requested pharmacy.

## 2022-10-29 DIAGNOSIS — E1122 Type 2 diabetes mellitus with diabetic chronic kidney disease: Secondary | ICD-10-CM | POA: Diagnosis not present

## 2022-10-29 DIAGNOSIS — I10 Essential (primary) hypertension: Secondary | ICD-10-CM | POA: Diagnosis not present

## 2022-10-29 DIAGNOSIS — N182 Chronic kidney disease, stage 2 (mild): Secondary | ICD-10-CM | POA: Diagnosis not present

## 2022-10-29 DIAGNOSIS — E785 Hyperlipidemia, unspecified: Secondary | ICD-10-CM | POA: Diagnosis not present

## 2022-10-29 DIAGNOSIS — E559 Vitamin D deficiency, unspecified: Secondary | ICD-10-CM | POA: Diagnosis not present

## 2022-10-29 LAB — LAB REPORT - SCANNED
A1c: 7.5
Albumin, Urine POC: 3.2
Creatinine, POC: 102.7 mg/dL
EGFR: 66
Microalb Creat Ratio: 3

## 2022-11-04 DIAGNOSIS — M1711 Unilateral primary osteoarthritis, right knee: Secondary | ICD-10-CM | POA: Diagnosis not present

## 2022-11-04 DIAGNOSIS — M25562 Pain in left knee: Secondary | ICD-10-CM | POA: Diagnosis not present

## 2022-11-04 DIAGNOSIS — M1712 Unilateral primary osteoarthritis, left knee: Secondary | ICD-10-CM | POA: Diagnosis not present

## 2022-11-05 ENCOUNTER — Encounter: Payer: Self-pay | Admitting: Family Medicine

## 2022-11-05 DIAGNOSIS — I4891 Unspecified atrial fibrillation: Secondary | ICD-10-CM | POA: Diagnosis not present

## 2022-11-05 DIAGNOSIS — H353131 Nonexudative age-related macular degeneration, bilateral, early dry stage: Secondary | ICD-10-CM | POA: Diagnosis not present

## 2022-11-05 DIAGNOSIS — D638 Anemia in other chronic diseases classified elsewhere: Secondary | ICD-10-CM | POA: Diagnosis not present

## 2022-11-05 DIAGNOSIS — H04123 Dry eye syndrome of bilateral lacrimal glands: Secondary | ICD-10-CM | POA: Diagnosis not present

## 2022-11-05 DIAGNOSIS — K219 Gastro-esophageal reflux disease without esophagitis: Secondary | ICD-10-CM | POA: Diagnosis not present

## 2022-11-05 DIAGNOSIS — E1122 Type 2 diabetes mellitus with diabetic chronic kidney disease: Secondary | ICD-10-CM | POA: Diagnosis not present

## 2022-11-05 DIAGNOSIS — Z961 Presence of intraocular lens: Secondary | ICD-10-CM | POA: Diagnosis not present

## 2022-11-05 DIAGNOSIS — I129 Hypertensive chronic kidney disease with stage 1 through stage 4 chronic kidney disease, or unspecified chronic kidney disease: Secondary | ICD-10-CM | POA: Diagnosis not present

## 2022-11-05 DIAGNOSIS — J449 Chronic obstructive pulmonary disease, unspecified: Secondary | ICD-10-CM | POA: Diagnosis not present

## 2022-11-05 DIAGNOSIS — I5032 Chronic diastolic (congestive) heart failure: Secondary | ICD-10-CM | POA: Diagnosis not present

## 2022-11-05 DIAGNOSIS — E785 Hyperlipidemia, unspecified: Secondary | ICD-10-CM | POA: Diagnosis not present

## 2022-11-05 DIAGNOSIS — N182 Chronic kidney disease, stage 2 (mild): Secondary | ICD-10-CM | POA: Diagnosis not present

## 2022-11-05 DIAGNOSIS — R053 Chronic cough: Secondary | ICD-10-CM | POA: Diagnosis not present

## 2022-11-05 DIAGNOSIS — I255 Ischemic cardiomyopathy: Secondary | ICD-10-CM | POA: Diagnosis not present

## 2022-11-05 DIAGNOSIS — E559 Vitamin D deficiency, unspecified: Secondary | ICD-10-CM | POA: Diagnosis not present

## 2022-11-05 DIAGNOSIS — H11153 Pinguecula, bilateral: Secondary | ICD-10-CM | POA: Diagnosis not present

## 2022-12-02 ENCOUNTER — Other Ambulatory Visit: Payer: Self-pay | Admitting: *Deleted

## 2022-12-02 MED ORDER — ISOSORBIDE MONONITRATE ER 30 MG PO TB24: 30.0000 mg | ORAL_TABLET | Freq: Every day | ORAL | 0 refills | Status: DC

## 2022-12-05 DIAGNOSIS — M1712 Unilateral primary osteoarthritis, left knee: Secondary | ICD-10-CM | POA: Diagnosis not present

## 2022-12-12 DIAGNOSIS — M1712 Unilateral primary osteoarthritis, left knee: Secondary | ICD-10-CM | POA: Diagnosis not present

## 2022-12-18 DIAGNOSIS — M1712 Unilateral primary osteoarthritis, left knee: Secondary | ICD-10-CM | POA: Diagnosis not present

## 2023-01-12 ENCOUNTER — Encounter: Payer: Self-pay | Admitting: Interventional Radiology

## 2023-01-12 ENCOUNTER — Other Ambulatory Visit (HOSPITAL_COMMUNITY): Payer: Self-pay | Admitting: Internal Medicine

## 2023-01-12 DIAGNOSIS — I9789 Other postprocedural complications and disorders of the circulatory system, not elsewhere classified: Secondary | ICD-10-CM

## 2023-01-13 ENCOUNTER — Other Ambulatory Visit: Payer: Self-pay | Admitting: Interventional Radiology

## 2023-01-13 DIAGNOSIS — I9789 Other postprocedural complications and disorders of the circulatory system, not elsewhere classified: Secondary | ICD-10-CM

## 2023-01-14 MED ORDER — POTASSIUM CHLORIDE CRYS ER 20 MEQ PO TBCR: 30.0000 meq | EXTENDED_RELEASE_TABLET | Freq: Every day | ORAL | 0 refills | Status: DC

## 2023-02-06 ENCOUNTER — Other Ambulatory Visit: Payer: Self-pay | Admitting: Internal Medicine

## 2023-02-09 ENCOUNTER — Ambulatory Visit
Admission: RE | Admit: 2023-02-09 | Discharge: 2023-02-09 | Disposition: A | Discharge status: Home / Self Care | Payer: Medicare Other | Source: Ambulatory Visit | Attending: Interventional Radiology

## 2023-02-09 DIAGNOSIS — I9789 Other postprocedural complications and disorders of the circulatory system, not elsewhere classified: Secondary | ICD-10-CM

## 2023-02-09 MED ORDER — IOPAMIDOL (ISOVUE-370) INJECTION 76%
100.0000 mL | Freq: Once | INTRAVENOUS | Status: AC | PRN
  Administered 2023-02-09: 100 mL via INTRAVENOUS

## 2023-02-25 ENCOUNTER — Encounter: Payer: Self-pay | Admitting: Internal Medicine

## 2023-02-25 ENCOUNTER — Ambulatory Visit: Payer: Medicare Other | Attending: Internal Medicine | Admitting: Internal Medicine

## 2023-02-25 VITALS — BP 118/72 | HR 75 | Ht 74.0 in | Wt 286.0 lb

## 2023-02-25 DIAGNOSIS — I4891 Unspecified atrial fibrillation: Secondary | ICD-10-CM

## 2023-02-25 DIAGNOSIS — Z8679 Personal history of other diseases of the circulatory system: Secondary | ICD-10-CM

## 2023-02-25 DIAGNOSIS — Z951 Presence of aortocoronary bypass graft: Secondary | ICD-10-CM | POA: Diagnosis not present

## 2023-02-25 DIAGNOSIS — I5042 Chronic combined systolic (congestive) and diastolic (congestive) heart failure: Secondary | ICD-10-CM | POA: Diagnosis not present

## 2023-02-25 DIAGNOSIS — I255 Ischemic cardiomyopathy: Secondary | ICD-10-CM

## 2023-02-25 DIAGNOSIS — Z9889 Other specified postprocedural states: Secondary | ICD-10-CM | POA: Diagnosis not present

## 2023-02-25 DIAGNOSIS — I10 Essential (primary) hypertension: Secondary | ICD-10-CM | POA: Diagnosis not present

## 2023-02-25 NOTE — Progress Notes (Signed)
OFFICE NOTE  Chief Complaint:  No complaints  Primary Care Physician: Linus Galas, NP  HPI:  Ryan Wise is 82 y/o with a history of CAD, s/p CABG X 4 in 1999 with an LIMA-LAD, SVG-OM, SVG-Dx, SVG-RCA. He presented to [facility] 12/18/12 with a STEMI and was taken to the cath lab by Dr Tresa Endo. This revealed the LIMA to the LAD to be patent with collaterals to the Dx. The SVG- OM: was occluded and the SVG- DX: was occluded The culprit appeared to be occlusion of SVG to distal RCA with no flow proximal due to anastomis occlusion with thrombus. He underwent a very difficult PCI with PTCA/ thrombectomy. He had no flow phenomenon requiring IC/IV NTG/IC verapamil/ angiomax, brilinta 180 mg, integrelin. He ultimately received a Xience Xpedition stent to distal anastomosis. He did have acute, transient CHF but stabilized with diuresis. He did well and was transferred to telemetry on 12/21/12.. Troponin was greater than 20. EF at cath was 35%. EF by echo was 35=40% with severe hypokinesis of the basal inferolateral myocardium; moderate hypokinesis of the basal-mid inferior and mid inferolateral myocardium; and mild hypokinesis of the basal-mid inferoseptal and apical septal myocardium. After he was transferred to telemetry he awaked early in the morning of the 30th with neck pain. He was transferred back to the ICU by the cardiology fellow on call. The pt was seen and examined by Dr Rennis Golden the morning of the 29th. Dr Rennis Golden did not feel his symptoms were cardiac. We added a NSAID and Skelaxin. He took this for a day or 2 he reported his neck pain improved significantly.  Today is here for followup and appears to be doing fairly well. He does report some shortness of breath doing moderate to more intense activities which he is trying to avoid. He did some lawn mowing which made her short of breath after a while. 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 He does feel that his energy level has  improved significantly and hopefully this means that his EF has come up as well. He continues to take naproxen on a daily basis, however it was intended that he only take this for a short period time for his neck pain. He did however discontinue Skelaxin. I reviewed recent laboratory work from his primary care provider today including a cholesterol profile. I was surprised to see how low that was, noting that his total cholesterol was 80, triglycerides 107 HDL 35 and LDL of 24. This is on atorvastatin 80 mg daily.  I saw Ryan Wise back in the office today. He continues to do very well. He is asymptomatic this had no significant weight gain or worsening shortness of breath. He denies any extremity edema. Blood pressure is very well-controlled today 122/60. He recently had laboratory work through his primary care provider which shows excellent cholesterol control. He does have a history of cardiomyopathy and is due for repeat echocardiogram.   Ryan Wise returns today for follow-up. Overall he is doing exceedingly well. He denies any worsening chest pain or shortness of breath. He's done well on his current medicines. Cholesterol is been well controlled. 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 Recent echocardiogram earlier this year shows an improvement in EF up to 45%.  01/31/2016  Ryan Wise returns today for follow-up. Unfortunately recently presented to the emergency department with abdominal pain, nausea and vomiting as well as diarrhea and was thought to have  a gastroenteritis. 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 Of note, he had an ultrasound of the abdomen in April 2016 which the radiologist commented that "the abdominal aorta is poorly seen due to overlying bowel gas". Subsequently he underwent EVAR which is complicated by a small type II endoleak. Since surgery Ryan Wise is felt somewhat fatigued and does get short of  breath with some minimal exertion. In the office today he was noted to be in atrial fibrillation with a slow ventricular response at 47. This is a new diagnosis for him. He is on low-dose metoprolol, given his history of cardiomyopathy. He only takes aspirin for his current anticoagulation. 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  02/14/2016  Ryan Wise returns today for follow-up. He has done well with discontinuing his beta blocker. Heart rate is now improved up to 58. He remains in atrial fibrillation. He does not feel quite as fatigued but does notice some fatigue. He seems to be tolerating Eliquis without any bleeding problems. I did review his recent endoleak with Dr. Myra Gianotti who felt it was okay to start Eliquis in the setting of recent EVAR. At this point the next step would be to try to obtain a normal sinus rhythm. He will need to be anticoagulated for at least 3-4 weeks prior to cardioversion.  04/15/2016  Ryan Wise returns today for follow-up. He underwent outpatient cardioversion by myself which was unsuccessful. I therefore arranged for him to go on Tikosyn therapy. He was admitted and loaded on 500 g twice daily of Tikosyn and required cardioversion to get to sinus rhythm. This was successful and he has maintained sinus rhythm since then. He was discharged and followed up in the A. fib clinic. He was noted to have some hypokalemia and is due for repeat check of his potassium and magnesium. It was recommended that he have EKGs every 3 months and repeat of potassium and magnesium as per prescribing guidelines. He recently was seen in the emergency department for a "stabbing" mid upper back pain. This was felt to be reproducible and musculoskeletal. He did have a CT scan which ruled out dissection. He was given muscle relaxants and noted improvement with that and Tylenol.  07/31/2016  Ryan Wise returns today for follow-up. Overall he seems to be doing  well. EKG shows sinus rhythm today. QTC of 469 ms. He is on Tikosyn 500 g twice a day. He is potassium 2 days ago was 4.6 however magnesium was slightly low at 1.9. He is on magnesium oxide 400 mg daily. He denies any chest pain or worsening shortness of breath. There has been about a 6 pound weight gain however he reports has not been as active. He recently had bilateral cataract surgery for which she is recovering. He also saw Dr. Myra Gianotti recently, who noted that he had a small endoleak from his AAA repair but he will continue to monitor.  02/16/2017  Ryan Wise returns today for follow-up. He seems to be doing really well. His echo recent showed improvement in LV function with an EF increased to 45-50%. He denies any recurrent atrial fibrillation. He appears to be in either a low atrial accelerated junctional rhythm today is 69. QTC is 467 ms. He is on dofetilide 500 g twice a day. He also takes Eliquis and denies any bleeding problems. He's scheduled for follow-up with Dr. Myra Gianotti for an endoleak of his AAA repair. 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  09/02/2017  Ryan Wise was seen today in follow-up.  He denies any new symptoms.  He seems to be maintaining sinus on dofetilide.  His QTC today is 490 ms.  He had lab work in January which showed normal creatinine of 1.09.  Total cholesterol was 91 with LDL of 17.  Triglycerides were elevated to 21.  Hemoglobin A1c 6.5.  He continues to follow with Dr. Myra Gianotti as well as interventional radiology for an endoleak of his prior AAA repair.  He is on apixaban for stroke risk reduction.  He denies chest pain or worsening shortness of breath.  03/29/2019  Ryan Wise returns today for follow-up.  He was last seen in April via virtual visit.  He was without complaints.  Eventually, interventional radiology was able to stop an endoleak of his aortic graft.   Unfortunately his developed some anemia and was noted to be iron deficient on recent labs with an iron level of 10.  Otherwise H&H was 12.5 and 40.  His creatinine was normal.  His liver enzymes were normal.  His hemoglobin A1c was 6.6 and stable.  Cholesterol showed total of 72, triglycerides 136, HDL 28 and LDL of 20.  Vitamin B12 number was normal at 866 however he was recently placed on repletion.  Overall he feels well.  An EKG was performed today to monitor his Tikosyn.  The QTC is prolonged today at 547 ms, up from 4 to 90 ms during his last EKG.  There have been no new medication changes.  He is on potassium and magnesium supplements.  He is maintaining sinus rhythm.  10/10/2019  Ryan Wise is seen today for follow-up.  Overall he seems to be doing well without complaints.  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  He denies any chest pain or shortness of breath.  He recently followed up with Dr. Myra Gianotti for his AAA repair which has been stable if not improving.  He had some recent lipids done which showed excellent control of his cholesterol.  His LDL was 20 and a repeat just a few weeks ago was 28.  This is probably more treatment than he needs.  Blood pressure is well controlled today.  EKG shows he is maintaining sinus rhythm with first-degree AV block.  He does have some PVCs however he is asymptomatic with this.  QTC was 473 ms he remains on dofetilide 250 mcg twice daily.  04/11/2020  Ryan Wise returns today for follow-up.  Overall he is doing well without any new complaints.  He reports his AAA repair is stable without active endoleak.  His blood pressure has been well controlled.  He has no chest pain or worsening shortness of breath.  He denies palpitations or recurrent A. fib.  EKG today shows an ectopic atrial rhythm but the QTC is prolonged at 520 ms.  This is new for him.  He does take dofetilide 250 mcg twice daily  and Eliquis which she is tolerating well.  He is also on supplemental potassium and magnesium.  Those will need to be reassessed.  11/18/2021  Ryan Wise is seen today in follow-up.  Overall he says he is doing very well.  He has been struggling with some right knee pain and that has improved significantly with artificial collagen  injections.  He denies any chest pain or worsening shortness of breath.  He has followed up with Dr. Myra Gianotti but is overdue for repeat CT scan.  It is felt that his endovascular leak from AAA repair has healed.  He remains on dofetilide.  He had recent labs through his primary care provider.  This was in May 2023.  A1c was 7.1%.  Total cholesterol 88, HDL 35, triglycerides 129 and LDL 27.  Liver enzymes were normal.  Creatinine 1.11.  Potassium 4.8.  EKG personally reviewed today shows a sinus rhythm with sinus arrhythmia first-degree AV block and QTc 453 ms.  02/25/2023  Ryan Wise is seen today in follow-up.  Overall he says he is feeling well except he struggling with bilateral knee pain.  He is required some steroid injections as well as gel infusions which initially helped but recently have not helped much.  He is concerned that he may need to have surgery.  Blood pressure appears well-controlled today.  Personally rechecked it and was read at 118/72.  He is on dofetilide.  His EKG shows sinus rhythm with normal QTc and QRS duration.  He reports no bleeding issues on Eliquis.  He did have to cut up a tree that fell the other day, although he had done this for 20 to 30 years and did so carefully with a chainsaw.  His last echo was in 2018 which showed reduced LVEF at about 45%.  He had a recent repeat CT scan to look for follow-up of vascular endoleak related to his aneurysm repair.  He has follow-up with Dr. Fredia Sorrow regarding this.  PMHx:  Past Medical History:  Diagnosis Date   Aneurysm of infrarenal abdominal aorta (HCC)    a. 01/2016 s/p endovascular repair of AAA (EVAR).    Arthritis    Chronic combined systolic and diastolic CHF (congestive heart failure) (HCC)    a. 01/2016 Echo: EF 35-40%, diff HK, mildly dil Ao root, mild MR, mildly dil LA.   Coronary artery disease    a. 1999 s/p CABG x4 (LIMA->LAD, VG->OM, VG->Diag, VG->RCA); b. 11/2012 Inferior STEMI/PCI: LIMA->LAD ok, VG->Diag 100, VG->OM 100, VG->RCA was culprit (PTCA/thrombectomy->Xience Xpedition DES), EF 25%.   GERD (gastroesophageal reflux disease)    High triglycerides    Hypertension    Ischemic Cardiomyopathy    a. 01/2016 Echo: EF 35-40%, diff HK, inflat, inf AK.   Obesity    Persistent atrial fibrillation (HCC)    a. Dx 01/2016-->slow vent response on beta blocker;  b. CHA2DS2VASc = 6-->Eliquis;  c. 03/03/2016 unsuccessful DCCV.   Ruptured lumbar disc    ST elevation myocardial infarction (STEMI) of inferior wall (HCC) 12/18/12   STEMI of inf. wall w/PCI with Xperdition stent to VG to distal RCA   Type II diabetes mellitus (HCC)     Past Surgical History:  Procedure Laterality Date   BACK SURGERY     (5) back surgeries   CARDIOVERSION N/A 03/03/2016   Procedure: CARDIOVERSION;  Surgeon: Chrystie Nose, MD;  Location: [facility] ENDOSCOPY;  Service: Cardiovascular;  Laterality: N/A;   CARDIOVERSION N/A 03/12/2016   Procedure: CARDIOVERSION;  Surgeon: Chilton Si, MD;  Location: [facility] ENDOSCOPY;  Service: Cardiovascular;  Laterality: N/A;   CATARACT EXTRACTION, BILATERAL Right 07/10/2016   left eye 07/24/16   CORONARY ARTERY BYPASS GRAFT  1999   (CAD) CABG was a 5-vessel bypass and had a questionable history of A fib. He underwent monitoring whick showed sinus bradycardia and PACs but no evidence of  A fib.   ENDOVASCULAR STENT INSERTION N/A 12/28/2015   Procedure: ENDOVASCULAR STENT GRAFT INSERTION;  Surgeon: Nada Libman, MD;  Location: [facility] OR;  Service: Vascular;  Laterality: N/A;   IR ANGIOGRAM PELVIS SELECTIVE OR SUPRASELECTIVE  04/30/2018   IR ANGIOGRAM SELECTIVE EACH ADDITIONAL VESSEL   04/30/2018   IR ANGIOGRAM VISCERAL SELECTIVE  04/30/2018   IR EMBO ARTERIAL NOT HEMORR HEMANG INC GUIDE ROADMAPPING  04/30/2018   IR RADIOLOGIST EVAL & MGMT  03/24/2017   IR RADIOLOGIST EVAL & MGMT  04/14/2018   IR RADIOLOGIST EVAL & MGMT  07/08/2018   IR RADIOLOGIST EVAL & MGMT  05/11/2019   IR RADIOLOGIST EVAL & MGMT  05/31/2020   IR RADIOLOGIST EVAL & MGMT  01/10/2022   IR US GUIDE VASC ACCESS RIGHT  04/30/2018   LEFT HEART CATHETERIZATION WITH CORONARY/GRAFT ANGIOGRAM  12/18/2012   Procedure: LEFT HEART CATHETERIZATION WITH Isabel Caprice;  Surgeon: [name], MD;  Location: [facility] C[facility] LAB;  Service: Cardiovascular;;   LUMBAR DISC SURGERY     open heart surgery  03/1998   TONSILLECTOMY      FAMHx:  Family History  Problem Relation Age of Onset   CAD Mother    Heart disease Mother    CAD Brother    Heart attack Brother    Hyperlipidemia Sister    Leukemia Father    CAD Maternal Grandmother    Brain cancer Brother    CAD Sister     SOCHx:   reports that he quit smoking about 35 years ago. His smoking use included cigarettes. He started smoking about 65 years ago. He has a 135 pack-year smoking history. He has quit using smokeless tobacco.  His smokeless tobacco use included chew. He reports that he does not drink alcohol and does not use drugs.  ALLERGIES:  Allergies  Allergen Reactions   Other Other (See Comments)    Pain medication that he was given after open heart surgery-sweating and hallucinations (possibly percocet)   Cholecalciferol [Vitamin D-3] Other (See Comments)    CAUSES BOILS    Fish Oil Hives   Aspirin Nausea And Vomiting    Can't take full strength uncoated aspirin   Oxycodone Other (See Comments)    Hallucinations    ROS: Pertinent items noted in HPI and remainder of comprehensive ROS otherwise negative.  HOME MEDS: Current Outpatient Medications  Medication Sig Dispense Refill   acetaminophen (TYLENOL) 500 MG tablet Take 500 mg by mouth  every 6 (six) hours as needed for mild pain.     albuterol (PROVENTIL HFA;VENTOLIN HFA) 108 (90 Base) MCG/ACT inhaler Inhale 1 puff into the lungs every 6 (six) hours as needed for wheezing or shortness of breath.     aspirin EC 81 MG EC tablet Take 1 tablet (81 mg total) by mouth daily.     atorvastatin (LIPITOR) 40 MG tablet TAKE 1 TABLET BY MOUTH DAILY AT 6 PM. 90 tablet 3   carboxymethylcellulose (REFRESH PLUS) 0.5 % SOLN Place 1 drop into both eyes 3 (three) times daily as needed (DRY EYES).     Carboxymethylcellulose Sod PF (REFRESH PLUS) 0.5 % SOLN Apply to eye.     dofetilide (TIKOSYN) 250 MCG capsule TAKE 1 CAPSULE BY MOUTH 2 TIMES DAILY. 180 capsule 3   ELIQUIS 5 MG TABS tablet TAKE 1 TABLET BY MOUTH TWICE A DAY 180 tablet 1   Flaxseed, Linseed, (FLAX SEED OIL PO) Take 2 capsules by mouth 2 (two) times daily.  furosemide (LASIX) 20 MG tablet TAKE 1 TABLET BY MOUTH EVERY DAY 90 tablet 3   isosorbide mononitrate (IMDUR) 30 MG 24 hr tablet Take 1 tablet (30 mg total) by mouth daily. 90 tablet 0   lisinopril (ZESTRIL) 5 MG tablet TAKE 1 TABLET (5 MG TOTAL) BY MOUTH DAILY. 90 tablet 3   LORazepam (ATIVAN) 0.5 MG tablet Take 0.5 mg by mouth 2 (two) times daily.     Magnesium 400 MG CAPS Take 400 mg by mouth 2 (two) times daily. 60 capsule 5   metFORMIN (GLUCOPHAGE-XR) 750 MG 24 hr tablet 1 TABLET WITH EVENING MEAL ORALLY ONCE A DAY 90 90 tablet 3   methocarbamol (ROBAXIN) 500 MG tablet Take 1 tablet (500 mg total) by mouth 2 (two) times daily. 20 tablet 0   metoprolol tartrate (LOPRESSOR) 25 MG tablet 0.5 tablet     nitroGLYCERIN (NITROSTAT) 0.4 MG SL tablet Place 1 tablet (0.4 mg total) under the tongue every 5 (five) minutes as needed for chest pain. 25 tablet 3   potassium chloride SA (KLOR-CON M20) 20 MEQ tablet Take 1.5 tablets (30 mEq total) by mouth daily. 135 tablet 0   ranitidine (ZANTAC) 150 MG tablet Take 150 mg by mouth 2 (two) times daily.     No current facility-administered  medications for this visit.    LABS/IMAGING: No results found for this or any previous visit (from the past 48 hour(s)). No results found.  VITALS: BP 126/78 (BP Location: Left Arm, Patient Position: Sitting, Cuff Size: Large)   Pulse 75   SpO2 97%   EXAM: General appearance: alert and no distress Neck: no carotid bruit, no JVD and thyroid not enlarged, symmetric, no tenderness/mass/nodules Lungs: clear to auscultation bilaterally Heart: regular rate and rhythm, S1, S2 normal, no murmur, click, rub or gallop Abdomen: soft, non-tender; bowel sounds normal; no masses,  no organomegaly Extremities: extremities normal, atraumatic, no cyanosis or edema Pulses: 2+ and symmetric Skin: Skin color, texture, turgor normal. No rashes or lesions Neurologic: Grossly normal Psych: Pleasant  EKG: EKG Interpretation Date/Time:  Wednesday February 25 2023 09:29:30 EDT Ventricular Rate:  75 PR Interval:  312 QRS Duration:  130 QT Interval:  410 QTC Calculation: 457 R Axis:   -16  Text Interpretation: Sinus rhythm with marked sinus arrhythmia with 1st degree A-V block Non-specific intra-ventricular conduction block Inferior infarct (cited on or before 03-Apr-2016) When compared with ECG of 03-Apr-2016 16:27, PR interval has increased QRS duration has increased Non-specific change in ST segment in Inferior leads Nonspecific T wave abnormality, worse in Lateral leads Confirmed by Zoila Shutter [phone]) on 02/25/2023 9:49:17 AM    ASSESSMENT: Atrial fibrillation with slow ventricular response-CHADSVASC score of 5 Chronic Tikosyn therapy S/p AAA status post -EVAR with type II endoleak Coronary artery disease status post CABG with ST elevation MI and stent placement to the vein graft to PDA, with occluded grafts to the OM and diagonal vessels and a patent LIMA to LAD Ischemic cardiac myopathy EF 45% (improved to 45-50% by echo in 01/2017), prior inferior infact Hypertension Dyslipidemia  PLAN: 1.    Ryan Wise denies any chest pain or worsening shortness of breath.  His last echo was in 2018 which showed reduced LVEF at 45 to 50%.  He does have some inferior scar however like to reassess his EF by echo to see if this has changed.  He is doing well on Tikosyn therapy without any recurrent A-fib that he is aware of.  He continues to  have monitoring of his EVAR with type II endoleak.  Blood pressure is well-controlled and his cholesterol has been at target with recent LDL in June  2024, total cholesterol 93, HDL 31 and triglycerides 166.  I have encouraged continued work on weight loss and more exercise.  Plan otherwise follow-up with me annually or sooner as necessary.  Chrystie Nose, MD, [facility], FACP  [place]  [facility]  Medical Director of the Advanced Lipid Disorders &  Cardiovascular Risk Reduction Clinic Diplomate of the American Board of Clinical Lipidology Attending Cardiologist  Direct Dial: [phone]  Fax: [phone]  Website:  [email or web address] 02/25/2023, 9:49 AM

## 2023-02-25 NOTE — Patient Instructions (Signed)
Medication Instructions:  NO CHANGES  *If you need a refill on your cardiac medications before your next appointment, please call your pharmacy*    Testing/Procedures: Your physician has requested that you have an echocardiogram. Echocardiography is a painless test that uses sound waves to create images of your heart. It provides your doctor with information about the size and shape of your heart and how well your heart's chambers and valves are working. This procedure takes approximately one hour. There are no restrictions for this procedure. Please do NOT wear cologne, perfume, aftershave, or lotions (deodorant is allowed). Please arrive 15 minutes prior to your appointment time.    Follow-Up: At [facility], you and your health needs are our priority.  As part of our continuing mission to provide you with exceptional heart care, we have created designated Provider Care Teams.  These Care Teams include your primary Cardiologist (physician) and Advanced Practice Providers (APPs -  Physician Assistants and Nurse Practitioners) who all work together to provide you with the care you need, when you need it.  We recommend signing up for the patient portal called "MyChart".  Sign up information is provided on this After Visit Summary.  MyChart is used to connect with patients for Virtual Visits (Telemedicine).  Patients are able to view lab/test results, encounter notes, upcoming appointments, etc.  Non-urgent messages can be sent to your provider as well.   To learn more about what you can do with MyChart, go to [email or web address].    Your next appointment:    12 months with Dr. Hilty  

## 2023-02-26 ENCOUNTER — Other Ambulatory Visit: Payer: Self-pay | Admitting: Internal Medicine

## 2023-03-13 ENCOUNTER — Ambulatory Visit
Admission: RE | Admit: 2023-03-13 | Discharge: 2023-03-13 | Disposition: A | Discharge status: Home / Self Care | Payer: Medicare Other | Source: Ambulatory Visit | Attending: Interventional Radiology | Admitting: Interventional Radiology

## 2023-03-13 DIAGNOSIS — I7143 Infrarenal abdominal aortic aneurysm, without rupture: Secondary | ICD-10-CM | POA: Diagnosis not present

## 2023-03-13 DIAGNOSIS — I9789 Other postprocedural complications and disorders of the circulatory system, not elsewhere classified: Secondary | ICD-10-CM

## 2023-03-13 HISTORY — PX: IR RADIOLOGIST EVAL & MGMT: IMG5224

## 2023-03-13 NOTE — Progress Notes (Signed)
Chief Complaint: Patient was consulted remotely today (TeleHealth) for follow up after endoleak repair.   History of Present Illness: Ryan Wise is a 82 y.o. 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  Imaging after endoleak repair has demonstrated resolution of any visible endoleak by CTA but suggestion of slight increase in aneurysm sac size.  CTA in 2023 demonstrated slight enlargement of aneurysm sac size without definite endoleak. He remains asymptomatic with no abdominal or pelvic pain. He is still very active with no claudication. He has been having chronic knee pain related to osteoarthritis, right greater than left.  Past Medical History:  Diagnosis Date   Aneurysm of infrarenal abdominal aorta (HCC)    a. 01/2016 s/p endovascular repair of AAA (EVAR).   Arthritis    Chronic combined systolic and diastolic CHF (congestive heart failure) (HCC)    a. 01/2016 Echo: EF 35-40%, diff HK, mildly dil Ao root, mild MR, mildly dil LA.   Coronary artery disease    a. 1999 s/p CABG x4 (LIMA->LAD, VG->OM, VG->Diag, VG->RCA); b. 11/2012 Inferior STEMI/PCI: LIMA->LAD ok, VG->Diag 100, VG->OM 100, VG->RCA was culprit (PTCA/thrombectomy->Xience Xpedition DES), EF 25%.   GERD (gastroesophageal reflux disease)    High triglycerides    Hypertension    Ischemic Cardiomyopathy    a. 01/2016 Echo: EF 35-40%, diff HK, inflat, inf AK.   Obesity    Persistent atrial fibrillation (HCC)    a. Dx 01/2016-->slow vent response on beta blocker;  b. CHA2DS2VASc = 6-->Eliquis;  c. 03/03/2016 unsuccessful DCCV.   Ruptured lumbar disc    ST elevation myocardial infarction (STEMI) of inferior wall (HCC) 12/18/12   STEMI of inf. wall w/PCI with Xperdition stent to VG to distal RCA   Type II diabetes mellitus (HCC)     Past Surgical History:  Procedure Laterality Date   BACK SURGERY     (5) back  surgeries   CARDIOVERSION N/A 03/03/2016   Procedure: CARDIOVERSION;  Surgeon: Chrystie Nose, MD;  Location: [facility] ENDOSCOPY;  Service: Cardiovascular;  Laterality: N/A;   CARDIOVERSION N/A 03/12/2016   Procedure: CARDIOVERSION;  Surgeon: Chilton Si, MD;  Location: [facility] ENDOSCOPY;  Service: Cardiovascular;  Laterality: N/A;   CATARACT EXTRACTION, BILATERAL Right 07/10/2016   left eye 07/24/16   CORONARY ARTERY BYPASS GRAFT  1999   (CAD) CABG was a 5-vessel bypass and had a questionable history of A fib. He underwent monitoring whick showed sinus bradycardia and PACs but no evidence of A fib.   ENDOVASCULAR STENT INSERTION N/A 12/28/2015   Procedure: ENDOVASCULAR STENT GRAFT INSERTION;  Surgeon: Nada Libman, MD;  Location: [facility] OR;  Service: Vascular;  Laterality: N/A;   IR ANGIOGRAM PELVIS SELECTIVE OR SUPRASELECTIVE  04/30/2018   IR ANGIOGRAM SELECTIVE EACH ADDITIONAL VESSEL  04/30/2018   IR ANGIOGRAM VISCERAL SELECTIVE  04/30/2018   IR EMBO ARTERIAL NOT HEMORR HEMANG INC GUIDE ROADMAPPING  04/30/2018   IR RADIOLOGIST EVAL & MGMT  03/24/2017   IR RADIOLOGIST EVAL & MGMT  04/14/2018   IR RADIOLOGIST EVAL & MGMT  07/08/2018   IR RADIOLOGIST EVAL & MGMT  05/11/2019   IR RADIOLOGIST EVAL & MGMT  05/31/2020   IR RADIOLOGIST EVAL & MGMT  01/10/2022   IR US GUIDE VASC ACCESS RIGHT  04/30/2018   LEFT HEART CATHETERIZATION WITH CORONARY/GRAFT ANGIOGRAM  12/18/2012   Procedure: LEFT HEART CATHETERIZATION WITH CORONARY/GRAFT ANGIOGRAM;  Surgeon: Maisie Fus  Alphonsus Sias, MD;  Location[facility] C[facility] LAB;  Service: Cardiovascular;;   LUMBAR DISC SURGERY     open heart surgery  03/1998   TONSILLECTOMY      Allergies: Other, Cholecalciferol [vitamin d-3], Fish oil, Aspirin, and Oxycodone  Medications: Prior to Admission medications   Medication Sig Start Date End Date Taking? Authorizing Provider  acetaminophen (TYLENOL) 500 MG tablet Take 500 mg by mouth every 6 (six) hours as needed for mild pain.    [provider]  albuterol (PROVENTIL HFA;VENTOLIN HFA) 108 (90 Base) MCG/ACT inhaler Inhale 1 puff into the lungs every 6 (six) hours as needed for wheezing or shortness of breath.    [provider]  aspirin EC 81 MG EC tablet Take 1 tablet (81 mg total) by mouth daily. 12/22/12   Abelino Derrick, PA-C  atorvastatin (LIPITOR) 40 MG tablet TAKE 1 TABLET BY MOUTH DAILY AT 6 PM. 10/17/22   Hilty, Lisette Abu, MD  carboxymethylcellulose (REFRESH PLUS) 0.5 % SOLN Place 1 drop into both eyes 3 (three) times daily as needed (DRY EYES).    [provider]  Carboxymethylcellulose Sod PF (REFRESH PLUS) 0.5 % SOLN Apply to eye.    [provider]  dofetilide (TIKOSYN) 250 MCG capsule TAKE 1 CAPSULE BY MOUTH 2 TIMES DAILY. 02/06/23   Hilty, Lisette Abu, MD  ELIQUIS 5 MG TABS tablet TAKE 1 TABLET BY MOUTH TWICE A DAY 10/16/22   Hilty, Lisette Abu, MD  Flaxseed, Linseed, (FLAX SEED OIL PO) Take 2 capsules by mouth 2 (two) times daily.    [provider]  furosemide (LASIX) 20 MG tablet TAKE 1 TABLET BY MOUTH EVERY DAY 10/17/22   Hilty, Lisette Abu, MD  isosorbide mononitrate (IMDUR) 30 MG 24 hr tablet TAKE 1 TABLET BY MOUTH EVERY DAY 02/26/23   Hilty, Lisette Abu, MD  lisinopril (ZESTRIL) 5 MG tablet TAKE 1 TABLET (5 MG TOTAL) BY MOUTH DAILY. 10/17/22   Hilty, Lisette Abu, MD  LORazepam (ATIVAN) 0.5 MG tablet Take 0.5 mg by mouth 2 (two) times daily. 01/07/13   Chrystie Nose, MD  Magnesium 400 MG CAPS Take 400 mg by mouth 2 (two) times daily. 07/31/16   Chrystie Nose, MD  metFORMIN (GLUCOPHAGE-XR) 750 MG 24 hr tablet 1 TABLET WITH EVENING MEAL ORALLY ONCE A DAY 90 01/24/22   Hilty, Lisette Abu, MD  methocarbamol (ROBAXIN) 500 MG tablet Take 1 tablet (500 mg total) by mouth 2 (two) times daily. 06/21/18   Hedges, Tinnie Gens, PA-C  metoprolol tartrate (LOPRESSOR) 25 MG tablet 0.5 tablet    [provider]  nitroGLYCERIN (NITROSTAT) 0.4 MG SL tablet Place 1 tablet (0.4 mg total) under the tongue  every 5 (five) minutes as needed for chest pain. 12/22/12   Abelino Derrick, PA-C  potassium chloride SA (KLOR-CON M20) 20 MEQ tablet Take 1.5 tablets (30 mEq total) by mouth daily. 01/14/23   Hilty, Lisette Abu, MD  ranitidine (ZANTAC) 150 MG tablet Take 150 mg by mouth 2 (two) times daily.    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  Tobacco Use   Smoking status: Former    Current packs/day: 0.00    Average packs/day: 4.5 packs/day for 30.0 years (135.0 ttl pk-yrs)    Types: Cigarettes    Start date: 05/06/1957    Quit date: 05/07/1987    Years since quitting: 35.8   Smokeless tobacco: Former    Types: Associate Professor status: Never Used  Substance and Sexual Activity   Alcohol use: No   Drug use: No   Sexual activity: Not on file  Other Topics Concern   Not on file  Social History Narrative   Not on file   Social Determinants of Health   Financial Resource Strain: Not on file  Food Insecurity: Not on file  Transportation Needs: Not on file  Physical Activity: Not on file  Stress: Not on file  Social Connections: Not on file     Review of Systems  Constitutional: Negative.   Respiratory: Negative.    Cardiovascular: Negative.   Gastrointestinal: Negative.   Genitourinary: Negative.   Musculoskeletal:        Knee pain, R>L  Neurological: Negative.     Review of Systems: A 12 point ROS discussed and pertinent positives are indicated in the HPI above.  All  other systems are negative.  Physical Exam No direct physical exam was performed (except for noted visual exam findings with Video Visits).   Vital Signs: There were no vitals taken for this visit.  Imaging: See below.  Assessment and Plan:  I spoke with Ryan Wise over the phone. I reviewed CTA results with him from the follow up study on 02/09/2023 which show slight increase in aneurysm sac size from 9.2 x 9.5 cm to 9.5 x 9.8 cm over the last year. There is no discernible endoleak on arterial or delayed phases. Retrograde opacification of right L3 and bilateral L4 lumbar arteries approaches the aneurysm sac but does not definitively supply an endoleak by CTA. Given lack of symptoms, I recommended a follow up CTA in 12 months, but if there continues to be gradual sac enlargement, aortic and pelvic arteriography may be needed to search for a subtle endoleak. He is aware to let his physicians know if he develops any abdominal or pelvic pain.   Electronically Signed: Reola Calkins 03/13/2023, 8:11 AM    I spent a total of 10 Minutes in remote  clinical consultation, greater than 50% of which was counseling/coordinating care for aortic endoleak.    Visit type: Audio only (telephone). Audio (no video) only due to patient's lack of internet/smartphone capability. Alternative for in-person consultation at [facility], [address] [employer], [place], [place]. This visit type was conducted due to national recommendations for restrictions regarding the COVID-19 Pandemic (e.g. social distancing).  This format is felt to be most appropriate for this patient at this time.  All issues noted in this document were discussed and addressed.

## 2023-03-16 ENCOUNTER — Ambulatory Visit (HOSPITAL_COMMUNITY): Payer: Medicare Other | Attending: Internal Medicine

## 2023-03-16 DIAGNOSIS — I5042 Chronic combined systolic (congestive) and diastolic (congestive) heart failure: Secondary | ICD-10-CM | POA: Diagnosis not present

## 2023-03-16 DIAGNOSIS — Z951 Presence of aortocoronary bypass graft: Secondary | ICD-10-CM | POA: Insufficient documentation

## 2023-03-16 DIAGNOSIS — I4891 Unspecified atrial fibrillation: Secondary | ICD-10-CM | POA: Diagnosis not present

## 2023-03-16 DIAGNOSIS — I255 Ischemic cardiomyopathy: Secondary | ICD-10-CM | POA: Insufficient documentation

## 2023-03-16 LAB — ECHOCARDIOGRAM COMPLETE
Area-P 1/2: 5.7 cm2
S' Lateral: 5.6 cm

## 2023-03-17 ENCOUNTER — Other Ambulatory Visit (HOSPITAL_COMMUNITY): Payer: Self-pay | Admitting: *Deleted

## 2023-03-17 DIAGNOSIS — Z79899 Other long term (current) drug therapy: Secondary | ICD-10-CM

## 2023-03-17 DIAGNOSIS — I5042 Chronic combined systolic (congestive) and diastolic (congestive) heart failure: Secondary | ICD-10-CM

## 2023-03-17 MED ORDER — EMPAGLIFLOZIN 10 MG PO TABS: 10.0000 mg | ORAL_TABLET | Freq: Every day | ORAL | 11 refills | Status: DC

## 2023-03-17 MED ORDER — ENTRESTO 24-26 MG PO TABS: 1.0000 | ORAL_TABLET | Freq: Two times a day (BID) | ORAL | 11 refills | Status: DC

## 2023-03-18 NOTE — Addendum Note (Signed)
Addended by: Lindell Spar on: 03/18/2023 10:37 AM   Modules accepted: Orders

## 2023-03-23 ENCOUNTER — Other Ambulatory Visit (HOSPITAL_COMMUNITY): Payer: Self-pay

## 2023-03-23 ENCOUNTER — Telehealth: Payer: Self-pay | Admitting: Pharmacy Technician

## 2023-03-23 NOTE — Telephone Encounter (Signed)
Pharmacy Patient Advocate Encounter   Received notification from Patient Advice Request messages that prior authorization for jardiance is required/requested.   Insurance verification completed.   The patient is insured through  [employer]  .   Per test claim: Refill too soon. PA is not needed at this time. Medication was filled 03/17/23. Next eligible fill date is 04/09/23.

## 2023-03-23 NOTE — Telephone Encounter (Signed)
Update sent to patient in MyChart 

## 2023-04-02 ENCOUNTER — Telehealth: Payer: Self-pay | Admitting: Internal Medicine

## 2023-04-02 DIAGNOSIS — M7042 Prepatellar bursitis, left knee: Secondary | ICD-10-CM | POA: Diagnosis not present

## 2023-04-02 DIAGNOSIS — Z79899 Other long term (current) drug therapy: Secondary | ICD-10-CM | POA: Diagnosis not present

## 2023-04-02 DIAGNOSIS — M1712 Unilateral primary osteoarthritis, left knee: Secondary | ICD-10-CM | POA: Diagnosis not present

## 2023-04-02 DIAGNOSIS — I5042 Chronic combined systolic (congestive) and diastolic (congestive) heart failure: Secondary | ICD-10-CM | POA: Diagnosis not present

## 2023-04-02 NOTE — Telephone Encounter (Signed)
   Pt c/o Shortness Of Breath: STAT if SOB developed within the last 24 hours or pt is noticeably SOB on the phone  1. Are you currently SOB (can you hear that pt is SOB on the phone)? No   2. How long have you been experiencing SOB? Noticed it today while walking from elevator to their car   3. Are you SOB when sitting or when up moving around? When walking   4. Are you currently experiencing any other symptoms? Pt's daughter [name] calling, she said, they noticed that the pt get SOB after walking out from his doctor's office to their car. Pt was having heavy breathing and sweating. [name] requested that [place] call her back

## 2023-04-02 NOTE — Telephone Encounter (Signed)
Patient daughter is calling to let provider know patient is more short of breath. She stated patient was at an appointment today and when he got to the car from doctor's office he was very short of breath. He was having trouble speaking and sweaty/clammy. She deny any other symptoms at this time. After sitting and resting patient recovered to normal breathing.

## 2023-04-03 LAB — BASIC METABOLIC PANEL
BUN/Creatinine Ratio: 15 (ref 10–24)
BUN: 18 mg/dL (ref 8–27)
CO2: 20 mmol/L (ref 20–29)
Calcium: 9.5 mg/dL (ref 8.6–10.2)
Chloride: 104 mmol/L (ref 96–106)
Creatinine, Ser: 1.24 mg/dL (ref 0.76–1.27)
Glucose: 138 mg/dL — ABNORMAL HIGH (ref 70–99)
Potassium: 4.6 mmol/L (ref 3.5–5.2)
Sodium: 143 mmol/L (ref 134–144)
eGFR: 58 mL/min/{1.73_m2} — ABNORMAL LOW (ref >59)

## 2023-04-09 ENCOUNTER — Other Ambulatory Visit: Payer: Self-pay | Admitting: Internal Medicine

## 2023-04-11 ENCOUNTER — Other Ambulatory Visit (HOSPITAL_COMMUNITY): Payer: Self-pay | Admitting: Internal Medicine

## 2023-04-27 DIAGNOSIS — M7042 Prepatellar bursitis, left knee: Secondary | ICD-10-CM | POA: Diagnosis not present

## 2023-04-27 DIAGNOSIS — M1712 Unilateral primary osteoarthritis, left knee: Secondary | ICD-10-CM | POA: Diagnosis not present

## 2023-05-01 ENCOUNTER — Other Ambulatory Visit: Payer: Self-pay | Admitting: Internal Medicine

## 2023-05-01 DIAGNOSIS — I4819 Other persistent atrial fibrillation: Secondary | ICD-10-CM

## 2023-05-01 NOTE — Telephone Encounter (Signed)
Prescription refill request for Eliquis received. Indication:afib Last office visit:10/24 Scr:1.24  11/24 Age: 82 Weight:129.7  kg  Prescription refilled

## 2023-05-07 DIAGNOSIS — I4891 Unspecified atrial fibrillation: Secondary | ICD-10-CM | POA: Diagnosis not present

## 2023-05-07 DIAGNOSIS — E785 Hyperlipidemia, unspecified: Secondary | ICD-10-CM | POA: Diagnosis not present

## 2023-05-07 DIAGNOSIS — E1122 Type 2 diabetes mellitus with diabetic chronic kidney disease: Secondary | ICD-10-CM | POA: Diagnosis not present

## 2023-05-07 DIAGNOSIS — N182 Chronic kidney disease, stage 2 (mild): Secondary | ICD-10-CM | POA: Diagnosis not present

## 2023-05-07 DIAGNOSIS — D638 Anemia in other chronic diseases classified elsewhere: Secondary | ICD-10-CM | POA: Diagnosis not present

## 2023-05-08 LAB — LAB REPORT - SCANNED
A1c: 7.1
Albumin, Urine POC: 9.9
Creatinine, POC: 110.1 mg/dL
EGFR: 65
Microalb Creat Ratio: 9

## 2023-05-14 DIAGNOSIS — Z0000 Encounter for general adult medical examination without abnormal findings: Secondary | ICD-10-CM | POA: Diagnosis not present

## 2023-05-14 DIAGNOSIS — I129 Hypertensive chronic kidney disease with stage 1 through stage 4 chronic kidney disease, or unspecified chronic kidney disease: Secondary | ICD-10-CM | POA: Diagnosis not present

## 2023-05-14 DIAGNOSIS — D638 Anemia in other chronic diseases classified elsewhere: Secondary | ICD-10-CM | POA: Diagnosis not present

## 2023-05-14 DIAGNOSIS — E785 Hyperlipidemia, unspecified: Secondary | ICD-10-CM | POA: Diagnosis not present

## 2023-05-14 DIAGNOSIS — R053 Chronic cough: Secondary | ICD-10-CM | POA: Diagnosis not present

## 2023-05-14 DIAGNOSIS — J449 Chronic obstructive pulmonary disease, unspecified: Secondary | ICD-10-CM | POA: Diagnosis not present

## 2023-05-14 DIAGNOSIS — I4891 Unspecified atrial fibrillation: Secondary | ICD-10-CM | POA: Diagnosis not present

## 2023-05-14 DIAGNOSIS — N182 Chronic kidney disease, stage 2 (mild): Secondary | ICD-10-CM | POA: Diagnosis not present

## 2023-05-14 DIAGNOSIS — I5032 Chronic diastolic (congestive) heart failure: Secondary | ICD-10-CM | POA: Diagnosis not present

## 2023-05-14 DIAGNOSIS — I2510 Atherosclerotic heart disease of native coronary artery without angina pectoris: Secondary | ICD-10-CM | POA: Diagnosis not present

## 2023-05-14 DIAGNOSIS — E1122 Type 2 diabetes mellitus with diabetic chronic kidney disease: Secondary | ICD-10-CM | POA: Diagnosis not present

## 2023-05-14 DIAGNOSIS — E559 Vitamin D deficiency, unspecified: Secondary | ICD-10-CM | POA: Diagnosis not present

## 2023-07-01 ENCOUNTER — Emergency Department (HOSPITAL_COMMUNITY)
Admission: EM | Admit: 2023-07-01 | Discharge: 2023-07-02 | Disposition: A | Discharge status: Home / Self Care | Payer: Medicare Other | Attending: Emergency Medicine | Admitting: Emergency Medicine

## 2023-07-01 ENCOUNTER — Encounter: Payer: Self-pay | Admitting: Internal Medicine

## 2023-07-01 ENCOUNTER — Emergency Department (HOSPITAL_COMMUNITY): Payer: Medicare Other

## 2023-07-01 ENCOUNTER — Encounter (HOSPITAL_COMMUNITY): Payer: Self-pay

## 2023-07-01 ENCOUNTER — Other Ambulatory Visit: Payer: Self-pay

## 2023-07-01 DIAGNOSIS — M436 Torticollis: Secondary | ICD-10-CM

## 2023-07-01 DIAGNOSIS — R7309 Other abnormal glucose: Secondary | ICD-10-CM | POA: Insufficient documentation

## 2023-07-01 DIAGNOSIS — Z7901 Long term (current) use of anticoagulants: Secondary | ICD-10-CM | POA: Insufficient documentation

## 2023-07-01 DIAGNOSIS — Z7984 Long term (current) use of oral hypoglycemic drugs: Secondary | ICD-10-CM | POA: Diagnosis not present

## 2023-07-01 DIAGNOSIS — Z79899 Other long term (current) drug therapy: Secondary | ICD-10-CM | POA: Insufficient documentation

## 2023-07-01 DIAGNOSIS — I679 Cerebrovascular disease, unspecified: Secondary | ICD-10-CM

## 2023-07-01 DIAGNOSIS — M542 Cervicalgia: Secondary | ICD-10-CM | POA: Diagnosis present

## 2023-07-01 DIAGNOSIS — Z7982 Long term (current) use of aspirin: Secondary | ICD-10-CM | POA: Diagnosis not present

## 2023-07-01 LAB — BASIC METABOLIC PANEL
Anion gap: 11 (ref 5–15)
BUN: 14 mg/dL (ref 8–23)
CO2: 24 mmol/L (ref 22–32)
Calcium: 9 mg/dL (ref 8.9–10.3)
Chloride: 100 mmol/L (ref 98–111)
Creatinine, Ser: 1.16 mg/dL (ref 0.61–1.24)
GFR, Estimated: >60 mL/min (ref >60)
Glucose, Bld: 155 mg/dL — ABNORMAL HIGH (ref 70–99)
Potassium: 4.5 mmol/L (ref 3.5–5.1)
Sodium: 135 mmol/L (ref 135–145)

## 2023-07-01 LAB — CBC
HCT: 45.6 % (ref 39.0–52.0)
Hemoglobin: 14.1 g/dL (ref 13.0–17.0)
MCH: 27.5 pg (ref 26.0–34.0)
MCHC: 30.9 g/dL (ref 30.0–36.0)
MCV: 88.9 fL (ref 80.0–100.0)
Platelets: 361 10*3/uL (ref 150–400)
RBC: 5.13 MIL/uL (ref 4.22–5.81)
RDW: 15.2 % (ref 11.5–15.5)
WBC: 13.2 10*3/uL — ABNORMAL HIGH (ref 4.0–10.5)
nRBC: 0 % (ref 0.0–0.2)

## 2023-07-01 LAB — TROPONIN I (HIGH SENSITIVITY): Troponin I (High Sensitivity): 12 ng/L (ref <18)

## 2023-07-01 NOTE — ED Triage Notes (Signed)
 Pt arrives with c/o neck pain that started yesterday. Pt reports the pain radiates into both shoulder blades and up into his head. Pt denies numbness or tingling in his arms.

## 2023-07-01 NOTE — ED Provider Triage Note (Signed)
 Emergency Medicine Provider Triage Evaluation Note  Ryan Wise , a 83 y.o. male  was evaluated in triage.  Pt complains of neck pain and shoulder pain, started yesterday, no improvement with pain pill / muscle relaxant at home.  Review of Systems  Positive: Neck pain, shoulder pain Negative: Chest pain  Physical Exam  BP 132/88 (BP Location: Right Arm)   Pulse (!) 103   Temp 98.1 F (36.7 C)   Resp 18   Wt 129.7 kg   SpO2 92%   BMI 36.72 kg/m  Gen:   Awake, no distress   Resp:  Normal effort  MSK:   Moves extremities without difficulty  Other:  To palpation central cervical spine, radiating to trapezius, no carotid bruit or JVD noted.   Medical Decision Making  Medically screening exam initiated at 9:45 PM.  Appropriate orders placed.  Ryan Wise was informed that the remainder of the evaluation will be completed by another provider, this initial triage assessment does not replace that evaluation, and the importance of remaining in the ED until their evaluation is complete.  Reports no improvement with narcotics or muscle relaxant -- radiates to head   Ryan Sherlean DEL, PA-C 07/01/23 2149

## 2023-07-02 LAB — TROPONIN I (HIGH SENSITIVITY): Troponin I (High Sensitivity): 12 ng/L (ref <18)

## 2023-07-02 MED ORDER — HYDROCODONE-ACETAMINOPHEN 10-325 MG PO TABS: 0.5000 | ORAL_TABLET | Freq: Four times a day (QID) | ORAL | 0 refills | Status: DC | PRN

## 2023-07-02 MED ORDER — FENTANYL CITRATE PF 50 MCG/ML IJ SOSY: 50.0000 ug | PREFILLED_SYRINGE | Freq: Once | INTRAMUSCULAR | Status: DC

## 2023-07-02 MED ORDER — DIAZEPAM 5 MG/ML IJ SOLN
2.5000 mg | Freq: Once | INTRAMUSCULAR | Status: AC
  Administered 2023-07-02: 2.5 mg via INTRAVENOUS
  Filled 2023-07-02: qty 2

## 2023-07-02 MED ORDER — DIAZEPAM 2 MG PO TABS: 2.0000 mg | ORAL_TABLET | Freq: Four times a day (QID) | ORAL | 0 refills | Status: DC | PRN

## 2023-07-02 MED ORDER — IOHEXOL 350 MG/ML SOLN
75.0000 mL | Freq: Once | INTRAVENOUS | Status: AC | PRN
  Administered 2023-07-02: 75 mL via INTRAVENOUS

## 2023-07-02 MED ORDER — FENTANYL CITRATE PF 50 MCG/ML IJ SOSY
25.0000 ug | PREFILLED_SYRINGE | Freq: Once | INTRAMUSCULAR | Status: AC
  Administered 2023-07-02: 25 ug via INTRAVENOUS
  Filled 2023-07-02: qty 1

## 2023-07-02 NOTE — ED Notes (Signed)
Pain med given 

## 2023-07-02 NOTE — ED Provider Notes (Signed)
 [facility] Provider Note   CSN: [id number] Arrival date & time: 07/01/23  2013     History  Chief Complaint  Patient presents with   Neck Pain    Ryan Wise is a 83 y.o. male who presents emergency department with chief complaint of right sided neck pain.  Patient states that he slept wrong yesterday when he woke up he had significant pain in the low right occiput radiating down into his right trapezius and shoulder blade.  His daughter who is at bedside try to do some massage for yesterday however today he woke up with even more intense pain.  He is unable to turn his neck at all without sharp shooting pain from his occiput into his right shoulder blade region.  He denies numbness weakness headache vision change nausea vomiting shortness of breath.  He is currently on hydrocodone  09/26/2023 and Flexeril  at home for a torn meniscus in his knee for which he is scheduled for surgery coming up.  He has taken that along with trying ice and Tylenol  without any relief of his symptoms.  HPI     Home Medications Prior to Admission medications   Medication Sig Start Date End Date Taking? Authorizing Provider  acetaminophen  (TYLENOL ) 500 MG tablet Take 500 mg by mouth every 6 (six) hours as needed for mild pain.    [provider]  albuterol  (PROVENTIL  HFA;VENTOLIN  HFA) 108 (90 Base) MCG/ACT inhaler Inhale 1 puff into the lungs every 6 (six) hours as needed for wheezing or shortness of breath.    [provider]  aspirin  EC 81 MG EC tablet Take 1 tablet (81 mg total) by mouth daily. 12/22/12   Kilroy, Luke K, PA-C  atorvastatin  (LIPITOR) 40 MG tablet TAKE 1 TABLET BY MOUTH DAILY AT 6 PM. 10/17/22   Hilty, Vinie BROCKS, MD  carboxymethylcellulose (REFRESH PLUS) 0.5 % SOLN Place 1 drop into both eyes 3 (three) times daily as needed (DRY EYES).    [provider]  Carboxymethylcellulose Sod PF (REFRESH PLUS) 0.5 % SOLN Apply to eye.     [provider]  dofetilide  (TIKOSYN ) 250 MCG capsule TAKE 1 CAPSULE BY MOUTH 2 TIMES DAILY. 02/06/23   Hilty, Vinie BROCKS, MD  ELIQUIS  5 MG TABS tablet TAKE 1 TABLET BY MOUTH TWICE A DAY 05/01/23   Hilty, Vinie BROCKS, MD  empagliflozin  (JARDIANCE ) 10 MG TABS tablet Take 1 tablet (10 mg total) by mouth daily before breakfast. 03/17/23   Hilty, Vinie BROCKS, MD  Flaxseed, Linseed, (FLAX SEED OIL PO) Take 2 capsules by mouth 2 (two) times daily.    [provider]  furosemide  (LASIX ) 20 MG tablet TAKE 1 TABLET BY MOUTH EVERY DAY 10/17/22   Hilty, Vinie BROCKS, MD  isosorbide  mononitrate (IMDUR ) 30 MG 24 hr tablet TAKE 1 TABLET BY MOUTH EVERY DAY 02/26/23   Hilty, Vinie BROCKS, MD  KLOR-CON  M20 20 MEQ tablet TAKE 1.5 TABLETS (30 MEQ TOTAL) BY MOUTH DAILY. 04/13/23   Hilty, Vinie BROCKS, MD  LORazepam  (ATIVAN ) 0.5 MG tablet Take 0.5 mg by mouth 2 (two) times daily. 01/07/13   Mona Vinie BROCKS, MD  Magnesium  400 MG CAPS Take 400 mg by mouth 2 (two) times daily. 07/31/16   Mona Vinie BROCKS, MD  metFORMIN (GLUCOPHAGE-XR) 750 MG 24 hr tablet 1 TABLET WITH EVENING MEAL ORALLY ONCE A DAY 90 04/09/23   Hilty, Vinie BROCKS, MD  methocarbamol  (ROBAXIN ) 500 MG tablet Take 1 tablet (500 mg  total) by mouth 2 (two) times daily. 06/21/18   Hedges, Reyes, PA-C  metoprolol  tartrate (LOPRESSOR ) 25 MG tablet 0.5 tablet    [provider]  nitroGLYCERIN  (NITROSTAT ) 0.4 MG SL tablet Place 1 tablet (0.4 mg total) under the tongue every 5 (five) minutes as needed for chest pain. 12/22/12   Kilroy, Luke K, PA-C  ranitidine (ZANTAC) 150 MG tablet Take 150 mg by mouth 2 (two) times daily.    [provider]  sacubitril-valsartan (ENTRESTO ) 24-26 MG Take 1 tablet by mouth 2 (two) times daily. 03/17/23   Hilty, Vinie BROCKS, MD      Allergies    Other, Cholecalciferol [vitamin d-3], Fish oil, Aspirin , and Oxycodone     Review of Systems   Review of Systems  Physical Exam Updated Vital Signs BP 132/88 (BP  Location: Right Arm)   Pulse (!) 103   Temp 98.1 F (36.7 C)   Resp 18   Wt 129.7 kg   SpO2 92%   BMI 36.72 kg/m  Physical Exam Vitals and nursing note reviewed.  Constitutional:      General: He is not in acute distress.    Appearance: He is well-developed. He is not diaphoretic.  HENT:     Head: Normocephalic and atraumatic.  Eyes:     General: No scleral icterus.    Conjunctiva/sclera: Conjunctivae normal.  Neck:      Comments: Patient with exquisite tenderness to palpation along the occipital ridge, posterior right side of his neck and into the shoulder blade region.  There is no evidence of rash.  Range of motion is significantly limited due to pain in this region.  He has no reproducible occipital nerve pain along the scalp, no evidence of herpes zoster. Cardiovascular:     Rate and Rhythm: Normal rate and regular rhythm.     Heart sounds: Normal heart sounds.  Pulmonary:     Effort: Pulmonary effort is normal. No respiratory distress.     Breath sounds: Normal breath sounds.  Abdominal:     Palpations: Abdomen is soft.     Tenderness: There is no abdominal tenderness.  Musculoskeletal:     Cervical back: Neck supple. Pain with movement and muscular tenderness present. No spinous process tenderness. Decreased range of motion.  Skin:    General: Skin is warm and dry.  Neurological:     Mental Status: He is alert.  Psychiatric:        Behavior: Behavior normal.     ED Results / Procedures / Treatments   Labs (all labs ordered are listed, but only abnormal results are displayed) Labs Reviewed  CBC - Abnormal; Notable for the following components:      Result Value   WBC 13.2 (*)    All other components within normal limits  BASIC METABOLIC PANEL - Abnormal; Notable for the following components:   Glucose, Bld 155 (*)    All other components within normal limits  TROPONIN I (HIGH SENSITIVITY)  TROPONIN I (HIGH SENSITIVITY)    EKG EKG  Interpretation Date/Time:  Wednesday July 01 2023 21:54:49 EST Ventricular Rate:  100 PR Interval:  208 QRS Duration:  118 QT Interval:  350 QTC Calculation: 451 R Axis:   0  Text Interpretation: Sinus rhythm with Premature atrial complexes with Abberant conduction Cannot rule out Inferior infarct , age undetermined Abnormal ECG When compared with ECG of 25-Feb-2023 09:29, No significant change was found Confirmed by Raford Lenis ([id number]) on 07/02/2023 1:43:52 AM  Radiology CT  ANGIO HEAD NECK W WO CM Result Date: 07/02/2023 CLINICAL DATA:  Sudden onset neck and head pain EXAM: CT ANGIOGRAPHY HEAD AND NECK WITH AND WITHOUT CONTRAST TECHNIQUE: Multidetector CT imaging of the head and neck was performed using the standard protocol during bolus administration of intravenous contrast. Multiplanar CT image reconstructions and MIPs were obtained to evaluate the vascular anatomy. Carotid stenosis measurements (when applicable) are obtained utilizing NASCET criteria, using the distal internal carotid diameter as the denominator. RADIATION DOSE REDUCTION: This exam was performed according to the departmental dose-optimization program which includes automated exposure control, adjustment of the mA and/or kV according to patient size and/or use of iterative reconstruction technique. CONTRAST:  75mL OMNIPAQUE  IOHEXOL  350 MG/ML SOLN COMPARISON:  06/21/2018 FINDINGS: CT HEAD FINDINGS Brain: There is no mass, hemorrhage or extra-axial collection. The size and configuration of the ventricles and extra-axial CSF spaces are normal. There is hypoattenuation of the white matter, most commonly indicating chronic small vessel disease. Vascular: Atherosclerotic calcification of the vertebral and internal carotid arteries at the skull base. No abnormal hyperdensity of the major intracranial arteries or dural venous sinuses. Skull: The visualized skull base, calvarium and extracranial soft tissues are normal. Sinuses/Orbits: No  fluid levels or advanced mucosal thickening of the visualized paranasal sinuses. No mastoid or middle ear effusion. Normal orbits. CTA NECK FINDINGS Skeleton: No acute abnormality or high grade bony spinal canal stenosis. Other neck: Normal pharynx, larynx and major salivary glands. No cervical lymphadenopathy. Unremarkable thyroid  gland. Upper chest: No pneumothorax or pleural effusion. No nodules or masses. Aortic arch: There is calcific atherosclerosis of the aortic arch. Conventional 3 vessel aortic branching pattern. RIGHT carotid system: No dissection, occlusion or aneurysm. Mild atherosclerotic calcification at the carotid bifurcation without hemodynamically significant stenosis. LEFT carotid system: No dissection, occlusion or aneurysm. There is mixed density atherosclerosis extending into the proximal ICA, resulting in approximately 40% stenosis. Vertebral arteries: Left dominant configuration. There is no dissection, occlusion or flow-limiting stenosis to the skull base (V1-V3 segments). CTA HEAD FINDINGS POSTERIOR CIRCULATION: Bilateral V4 segment atherosclerosis without high-grade stenosis. No proximal occlusion of the anterior or inferior cerebellar arteries. Basilar artery is normal. Superior cerebellar arteries are normal. There is a short segment occlusion of the proximal right P2 segment (series 10, image 110). The distal right PCA is patent. Left PCA is normal. ANTERIOR CIRCULATION: Atherosclerotic calcification of the internal carotid arteries at the skull base without hemodynamically significant stenosis. Anterior cerebral arteries are normal. Middle cerebral arteries are normal. Venous sinuses: As permitted by contrast timing, patent. Anatomic variants: None Review of the MIP images confirms the above findings. IMPRESSION: 1. No emergent large vessel occlusion. 2. 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 3. Approximately 40% stenosis of the proximal left  ICA. Aortic atherosclerosis (ICD10-I70.0). Electronically Signed   By: Franky Stanford M.D.   On: 07/02/2023 00:47    Procedures Procedures    Medications Ordered in ED Medications  diazepam  (VALIUM ) injection 2.5 mg (has no administration in time range)  fentaNYL  (SUBLIMAZE ) injection 50 mcg (has no administration in time range)  iohexol  (OMNIPAQUE ) 350 MG/ML injection 75 mL (75 mLs Intravenous Contrast Given 07/02/23 0011)    ED Course/ Medical Decision Making/ A&P                                 Medical Decision Making  This is an 83 year old male who presents [facility] to  the emergency department with chief complaint of neck and shoulder pain.  On physical examination this appears to be consistent with acute torticollis or muscle strain injury.  The patient has a normal neurologic examination. Differential diagnosis includes torticollis, vertebral artery dissection, thoracic aortic aneurysm, pulmonary embolus, posterior MI.  However his physical examination and and findings are all consistent with musculoskeletal neck pain.  I reviewed the patient's labs which were ordered in triage  Includes a basic metabolic panel glucose of 155, initial troponin within normal limits.  Given these findings I do not think he needs a second troponin as his symptoms are not consistent with ACS.  CBC shows mildly elevated white blood count.  I think this is likely acute phase reaction in the setting of severe pain.  I visualized and interpreted CT angiogram of the head and neck.  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  I discussed the case with Dr. Arora.  As the patient has no symptoms of chest pain shortness of breath or neurologic deficits he does not need further workup at this time however he does recommend an outpatient MRI of the brain for further assessment of his atherosclerotic disease.  Patient PDMP reviewed.  Will increase his dose of hydrocodone  from  5-10 325.  Patient also may use low-dose Valium  for muscle relief.  Discussed multimodal pain control including use of lidocaine  patches.  Feet feel that he would do best to follow-up with his PCP for outpatient PT eval and treat and trigger point injections.  Discussed all of this with him and his daughter at bedside and he appears appropriate for discharge at this time.       Final Clinical Impression(s) / ED Diagnoses Final diagnoses:  None    Rx / DC Orders ED Discharge Orders     None         Arloa Chroman, PA-C 07/02/23 0154    Raford Lenis, MD 07/02/23 [id number]

## 2023-07-02 NOTE — Discharge Instructions (Addendum)
 1) Yake the medication I have prescribed as directed 2) You can use supportive neck relief such as a soft foam collar or a rolled towel 3) Use over the counter 4% Lidocaine  patches (available with Salon Pas or Icy Hot) 5) Use Ice with a towel as a barrier between your skin and the ice  at the area of pain.  You have trouble breathing. You make loud, high-pitched sounds when you breathe, most often when you breathe in (stridor). You start to drool. You are unable to swallow saliva or liquids. You develop numbness or weakness in your hands or feet. You have changes in your speech, understanding, or vision. These symptoms may represent a serious problem that is an emergency. Do not wait to see if the symptoms will go away. Get medical help right away. Call your local emergency services (911 in the [place] Do not drive yourself to the hospital.

## 2023-07-07 ENCOUNTER — Telehealth: Payer: Self-pay | Admitting: *Deleted

## 2023-07-07 NOTE — Telephone Encounter (Signed)
   Pre-operative Risk Assessment    Patient Name: Ryan Wise  DOB: [birth year] MRN: [id number]   Date of last office visit: 02/25/23 DR. HILTY Date of next office visit: NONE   Request for Surgical Clearance    Procedure:   LEFT KNEE ARTHROSCOPY WITH MENISCAL DEBRIDEMENT   Date of Surgery:  Clearance 08/19/23                                Surgeon:  DR. Ollen Gross Surgeon's Group or Practice Name: [facility] Phone number:  [phone] [facility] Fax number:  [phone]   Type of Clearance Requested:   - Medical  - Pharmacy:  Hold Apixaban (Eliquis)     Type of Anesthesia:  General    Additional requests/questions:    Ryan Wise   07/07/2023, 12:19 PM

## 2023-07-09 ENCOUNTER — Telehealth: Payer: Self-pay | Admitting: *Deleted

## 2023-07-09 NOTE — Telephone Encounter (Signed)
Patient with diagnosis of afib on Eliquis for anticoagulation.    Procedure:  Procedure:    Date of procedure: 08/19/23   CHA2DS2-VASc Score = 6   This indicates a 9.7% annual risk of stroke. The patient's score is based upon: CHF History: 1 HTN History: 1 Diabetes History: 1 Stroke History: 0 Vascular Disease History: 1 Age Score: 2 Gender Score: 0     CrCl 90 mL/min Platelet count 361 K  Per office protocol, patient can hold Eliquis for 2 days prior to procedure.    **This guidance is not considered finalized until pre-operative APP has relayed final recommendations.**

## 2023-07-09 NOTE — Telephone Encounter (Signed)
   Name: Ryan Wise  DOB: 1941-01-04  MRN: [id number]  Primary Cardiologist: Chrystie Nose, MD  Preoperative team, please contact this patient and set up a phone call appointment for further preoperative risk assessment. Please obtain consent and complete medication review. Thank you for your help.  I confirm that guidance regarding antiplatelet and oral anticoagulation therapy has been completed and, if necessary, noted below.  Per office protocol, patient can hold Eliquis for 2 days prior to procedure.    I also confirmed the patient resides in the state of [place]. As per [facility] Medical Board telemedicine laws, the patient must reside in the state in which the provider is licensed.   Denyce Robert, NP 07/09/2023, 11:08 AM [facility]

## 2023-07-09 NOTE — Telephone Encounter (Signed)
Pt has been scheduled tele preop appt 07/31/23. Med rec and consent are done.

## 2023-07-09 NOTE — Telephone Encounter (Signed)
Pt has been scheduled tele preop appt 07/31/23. Med rec and consent are done.      Patient Consent for Virtual Visit        Don Tiu has provided verbal consent on 07/09/2023 for a virtual visit (video or telephone).   CONSENT FOR VIRTUAL VISIT FOR:  Ryan Wise  By participating in this virtual visit I agree to the following:  I hereby voluntarily request, consent and authorize [facility] and its employed or contracted physicians, physician assistants, nurse practitioners or other licensed health care professionals (the Practitioner), to provide me with telemedicine health care services (the "Services") as deemed necessary by the treating Practitioner. I acknowledge and consent to receive the Services by the Practitioner via telemedicine. I understand that the telemedicine visit will involve communicating with the Practitioner through live audiovisual communication technology and the disclosure of certain medical information by electronic transmission. I acknowledge that I have been given the opportunity to request an in-person assessment or other available alternative prior to the telemedicine visit and am voluntarily participating in the telemedicine visit.  I understand that I have the right to withhold or withdraw my consent to the use of telemedicine in the course of my care at any time, without affecting my right to future care or treatment, and that the Practitioner or I may terminate the telemedicine visit at any time. I understand that I have the right to inspect all information obtained and/or recorded in the course of the telemedicine visit and may receive copies of available information for a reasonable fee.  I understand that some of the potential risks of receiving the Services via telemedicine include:  Delay or interruption in medical evaluation due to technological equipment failure or disruption; Information transmitted may not be sufficient (e.g. poor resolution  of images) to allow for appropriate medical decision making by the Practitioner; and/or  In rare instances, security protocols could fail, causing a breach of personal health information.  Furthermore, I acknowledge that it is my responsibility to provide information about my medical history, conditions and care that is complete and accurate to the best of my ability. I acknowledge that Practitioner's advice, recommendations, and/or decision may be based on factors not within their control, such as incomplete or inaccurate data provided by me or distortions of diagnostic images or specimens that may result from electronic transmissions. I understand that the practice of medicine is not an exact science and that Practitioner makes no warranties or guarantees regarding treatment outcomes. I acknowledge that a copy of this consent can be made available to me via my patient portal [facility] MyChart), or I can request a printed copy by calling the office of [facility].    I understand that my insurance will be billed for this visit.   I have read or had this consent read to me. I understand the contents of this consent, which adequately explains the benefits and risks of the Services being provided via telemedicine.  I have been provided ample opportunity to ask questions regarding this consent and the Services and have had my questions answered to my satisfaction. I give my informed consent for the services to be provided through the use of telemedicine in my medical care

## 2023-07-10 ENCOUNTER — Other Ambulatory Visit (HOSPITAL_COMMUNITY): Payer: Self-pay | Admitting: Internal Medicine

## 2023-07-27 DIAGNOSIS — M5412 Radiculopathy, cervical region: Secondary | ICD-10-CM | POA: Diagnosis not present

## 2023-07-31 ENCOUNTER — Ambulatory Visit: Payer: Medicare Other | Attending: Nurse Practitioner

## 2023-07-31 DIAGNOSIS — Z01810 Encounter for preprocedural cardiovascular examination: Secondary | ICD-10-CM | POA: Diagnosis not present

## 2023-07-31 DIAGNOSIS — M47812 Spondylosis without myelopathy or radiculopathy, cervical region: Secondary | ICD-10-CM | POA: Diagnosis not present

## 2023-07-31 NOTE — Progress Notes (Signed)
 Virtual Visit via Telephone Note   Because of Ryan Wise co-morbid illnesses, he is at least at moderate risk for complications without adequate follow up.  This format is felt to be most appropriate for this patient at this time.  Due to technical limitations with video connection (technology), today's appointment will be conducted as an audio only telehealth visit, and Ryan Wise verbally agreed to proceed in this manner.   All issues noted in this document were discussed and addressed.  No physical exam could be performed with this format.  Evaluation Performed:  Preoperative cardiovascular risk assessment _____________   Date:  07/31/2023   Patient ID:  Ryan Wise, DOB November 13, 1940, MRN [id number] Patient Location:  Home Provider location:   Office  Primary Care Provider:  Linus Galas, NP Primary Cardiologist:  Chrystie Nose, MD  Chief Complaint / Patient Profile   83 y.o. y/o male with a h/o CAD s/p CABG x 4 in 1999 HLD, HTN, ICM, DM type II, persistent AF (on Eliquis), AAA s/p endovascular repair 2017 who is pending left knee arthroscopy with meniscus debridement and presents today for telephonic preoperative cardiovascular risk assessment.  History of Present Illness    Ryan Wise is a 83 y.o. male who presents via audio/video conferencing for a telehealth visit today.  Pt was last seen in cardiology clinic on 02/25/2023 by Dr. Rennis Golden.  At that time Ryan Wise was doing well but struggling with bilateral knee pain.  His blood pressure was well-controlled.  He was seen in the ED on 07/02/2023 with complaint of right-sided neck pain with cardiac etiology. The patient is now pending procedure as outlined above. Since his last visit, he has been doing well with no new cardiac complaints.  He is able to complete greater than 4 METS of activity but is limited with knee pain but denies any dizziness or chest pain with activity.  He denies chest pain, shortness of breath, lower  extremity edema, fatigue, palpitations, melena, hematuria, hemoptysis, diaphoresis, weakness, presyncope, syncope, orthopnea, and PND.   Past Medical History    Past Medical History:  Diagnosis Date   Aneurysm of infrarenal abdominal aorta (HCC)    a. 01/2016 s/p endovascular repair of AAA (EVAR).   Arthritis    Chronic combined systolic and diastolic CHF (congestive heart failure) (HCC)    a. 01/2016 Echo: EF 35-40%, diff HK, mildly dil Ao root, mild MR, mildly dil LA.   Coronary artery disease    a. 1999 s/p CABG x4 (LIMA->LAD, VG->OM, VG->Diag, VG->RCA); b. 11/2012 Inferior STEMI/PCI: LIMA->LAD ok, VG->Diag 100, VG->OM 100, VG->RCA was culprit (PTCA/thrombectomy->Xience Xpedition DES), EF 25%.   GERD (gastroesophageal reflux disease)    High triglycerides    Hypertension    Ischemic Cardiomyopathy    a. 01/2016 Echo: EF 35-40%, diff HK, inflat, inf AK.   Obesity    Persistent atrial fibrillation (HCC)    a. Dx 01/2016-->slow vent response on beta blocker;  b. CHA2DS2VASc = 6-->Eliquis;  c. 03/03/2016 unsuccessful DCCV.   Ruptured lumbar disc    ST elevation myocardial infarction (STEMI) of inferior wall (HCC) 12/18/12   STEMI of inf. wall w/PCI with Xperdition stent to VG to distal RCA   Type II diabetes mellitus (HCC)    Past Surgical History:  Procedure Laterality Date   BACK SURGERY     (5) back surgeries   CARDIOVERSION N/A 03/03/2016   Procedure: CARDIOVERSION;  Surgeon: Chrystie Nose, MD;  Location: [facility] ENDOSCOPY;  Service: Cardiovascular;  Laterality: N/A;   CARDIOVERSION N/A 03/12/2016   Procedure: CARDIOVERSION;  Surgeon: Chilton Si, MD;  Location: [facility] ENDOSCOPY;  Service: Cardiovascular;  Laterality: N/A;   CATARACT EXTRACTION, BILATERAL Right 07/10/2016   left eye 07/24/16   CORONARY ARTERY BYPASS GRAFT  1999   (CAD) CABG was a 5-vessel bypass and had a questionable history of A fib. He underwent monitoring whick showed sinus bradycardia and PACs but no evidence of A  fib.   ENDOVASCULAR STENT INSERTION N/A 12/28/2015   Procedure: ENDOVASCULAR STENT GRAFT INSERTION;  Surgeon: Nada Libman, MD;  Location: [facility] OR;  Service: Vascular;  Laterality: N/A;   IR ANGIOGRAM PELVIS SELECTIVE OR SUPRASELECTIVE  04/30/2018   IR ANGIOGRAM SELECTIVE EACH ADDITIONAL VESSEL  04/30/2018   IR ANGIOGRAM VISCERAL SELECTIVE  04/30/2018   IR EMBO ARTERIAL NOT HEMORR HEMANG INC GUIDE ROADMAPPING  04/30/2018   IR RADIOLOGIST EVAL & MGMT  03/24/2017   IR RADIOLOGIST EVAL & MGMT  04/14/2018   IR RADIOLOGIST EVAL & MGMT  07/08/2018   IR RADIOLOGIST EVAL & MGMT  05/11/2019   IR RADIOLOGIST EVAL & MGMT  05/31/2020   IR RADIOLOGIST EVAL & MGMT  01/10/2022   IR RADIOLOGIST EVAL & MGMT  03/13/2023   IR US GUIDE VASC ACCESS RIGHT  04/30/2018   LEFT HEART CATHETERIZATION WITH CORONARY/GRAFT ANGIOGRAM  12/18/2012   Procedure: LEFT HEART CATHETERIZATION WITH Isabel Caprice;  Surgeon: [name], MD;  Location: [facility] CATH LAB;  Service: Cardiovascular;;   LUMBAR DISC SURGERY     open heart surgery  03/1998   TONSILLECTOMY      Allergies  Allergies  Allergen Reactions   Other Other (See Comments)    Pain medication that he was given after open heart surgery-sweating and hallucinations (possibly percocet)   Cholecalciferol [Vitamin D-3] Other (See Comments)    CAUSES BOILS    Fish Oil Hives   Aspirin Nausea And Vomiting    Can't take full strength uncoated aspirin   Oxycodone Other (See Comments)    Hallucinations    Home Medications    Prior to Admission medications   Medication Sig Start Date End Date Taking? Authorizing Provider  albuterol (PROVENTIL HFA;VENTOLIN HFA) 108 (90 Base) MCG/ACT inhaler Inhale 1 puff into the lungs every 6 (six) hours as needed for wheezing or shortness of breath.    [provider]  aspirin EC 81 MG EC tablet Take 1 tablet (81 mg total) by mouth daily. 12/22/12   Abelino Derrick, PA-C  atorvastatin (LIPITOR) 40 MG tablet TAKE 1 TABLET BY  MOUTH DAILY AT 6 PM. 10/17/22   Hilty, Lisette Abu, MD  carboxymethylcellulose (REFRESH PLUS) 0.5 % SOLN Place 1 drop into both eyes 3 (three) times daily as needed (DRY EYES).    [provider]  Carboxymethylcellulose Sod PF (REFRESH PLUS) 0.5 % SOLN Apply to eye.    [provider]  diazepam (VALIUM) 2 MG tablet Take 1 tablet (2 mg total) by mouth every 6 (six) hours as needed for anxiety. 07/02/23   Harris, Abigail, PA-C  dofetilide (TIKOSYN) 250 MCG capsule TAKE 1 CAPSULE BY MOUTH 2 TIMES DAILY. 02/06/23   Hilty, Lisette Abu, MD  ELIQUIS 5 MG TABS tablet TAKE 1 TABLET BY MOUTH TWICE A DAY 05/01/23   Hilty, Lisette Abu, MD  empagliflozin (JARDIANCE) 10 MG TABS tablet Take 1 tablet (10 mg total) by mouth daily before breakfast. 03/17/23   Hilty, Lisette Abu, MD  Flaxseed, Linseed, (FLAX SEED OIL PO)  Take 2 capsules by mouth 2 (two) times daily.    [provider]  furosemide (LASIX) 20 MG tablet TAKE 1 TABLET BY MOUTH EVERY DAY 10/17/22   Hilty, Lisette Abu, MD  HYDROcodone-acetaminophen (NORCO) 10-325 MG tablet Take 0.5-1 tablets by mouth every 6 (six) hours as needed for severe pain (pain score 7-10). 07/02/23   Arthor Captain, PA-C  isosorbide mononitrate (IMDUR) 30 MG 24 hr tablet TAKE 1 TABLET BY MOUTH EVERY DAY 02/26/23   Hilty, Lisette Abu, MD  LORazepam (ATIVAN) 0.5 MG tablet Take 0.5 mg by mouth 2 (two) times daily. 01/07/13   Chrystie Nose, MD  Magnesium 400 MG CAPS Take 400 mg by mouth 2 (two) times daily. 07/31/16   Chrystie Nose, MD  metFORMIN (GLUCOPHAGE-XR) 750 MG 24 hr tablet 1 TABLET WITH EVENING MEAL ORALLY ONCE A DAY 90 04/09/23   Hilty, Lisette Abu, MD  methocarbamol (ROBAXIN) 500 MG tablet Take 1 tablet (500 mg total) by mouth 2 (two) times daily. 06/21/18   Hedges, Tinnie Gens, PA-C  metoprolol tartrate (LOPRESSOR) 25 MG tablet 0.5 tablet    [provider]  nitroGLYCERIN (NITROSTAT) 0.4 MG SL tablet Place 1 tablet (0.4 mg total) under the tongue every 5 (five)  minutes as needed for chest pain. 12/22/12   Abelino Derrick, PA-C  potassium chloride SA (KLOR-CON M20) 20 MEQ tablet TAKE 1.5 TABLETS (30 MEQ TOTAL) BY MOUTH DAILY. 07/10/23   Hilty, Lisette Abu, MD  ranitidine (ZANTAC) 150 MG tablet Take 150 mg by mouth 2 (two) times daily.    [provider]  sacubitril-valsartan (ENTRESTO) 24-26 MG Take 1 tablet by mouth 2 (two) times daily. 03/17/23   Chrystie Nose, MD    Physical Exam    Vital Signs:  Aikeem Lilley does not have vital signs available for review today.  Given telephonic nature of communication, physical exam is limited. AAOx3. NAD. Normal affect.  Speech and respirations are unlabored.  Accessory Clinical Findings    None  Assessment & Plan    1.  Preoperative Cardiovascular Risk Assessment: -Patient's RCRI score is 11%  The patient affirms he has been doing well without any new cardiac symptoms. They are able to achieve 6 METS without cardiac limitations. Therefore, based on ACC/AHA guidelines, the patient would be at acceptable risk for the planned procedure without further cardiovascular testing. The patient was advised that if he develops new symptoms prior to surgery to contact our office to arrange for a follow-up visit, and he verbalized understanding.    The patient was advised that if he develops new symptoms prior to surgery to contact our office to arrange for a follow-up visit, and he verbalized understanding.  -Per office protocol, patient can hold Eliquis for 2 days prior to procedure.   -Patient also on Jardiance- hold for 3 days prior  A copy of this note will be routed to requesting surgeon.  Time:   Today, I have spent 8 minutes with the patient with telehealth technology discussing medical history, symptoms, and management plan.     Napoleon Form, Leodis Rains, NP  07/31/2023, 7:04 AM

## 2023-08-12 ENCOUNTER — Other Ambulatory Visit: Payer: Self-pay

## 2023-08-12 ENCOUNTER — Encounter (HOSPITAL_COMMUNITY): Payer: Self-pay | Admitting: Orthopedic Surgery

## 2023-08-12 NOTE — Progress Notes (Addendum)
 COVID Vaccine Completed:  Date of COVID positive in last 90 days:  No  PCP - Linus Galas, NP Cardiologist - Zoila Shutter, MD  Cardiac clearance in Epic dated 07-31-23  Chest x-ray - N/A EKG - 07-02-23 Epic Stress Test -  ECHO - 03-16-23 Epic Cardiac Cath - Yes Pacemaker/ICD device last checked: Spinal Cord Stimulator: N/A  Bowel Prep - N/A  Sleep Study - N/A CPAP -   Fasting Blood Sugar -  Checks Blood Sugar - does not check   Last dose of GLP1 agonist-  N/A GLP1 instructions:  Hold 7 days before surgery    Jardiance Last dose of SGLT-2 inhibitors-  08-13-23 SGLT-2 instructions:  Hold 3 days before surgery, patient's daughter is aware  Blood Thinner Instructions:  Eliquis.  Hold 2 days before surgery.  Patient's daughter aware and has instructions to take last dose on 3/21.  Aspirin Instructions: Last Dose:  Activity level:  Can go up a flight of stairs and perform activities of daily living without stopping and without symptoms of chest pain or shortness of breath.  Patient lives alone and has some limitations due to knee pain.    Anesthesia review:  CAD s/p CABG, Hx of MI, CHF, AAA s/p repair, Afib, HTN, DM  Patient denies shortness of breath, fever, cough and chest pain at PAT appointment (completed over the phone)  Patient verbalized understanding of instructions that were given to them at the PAT appointment. Patient was also instructed that they will need to review over the PAT instructions again at home before surgery.

## 2023-08-13 NOTE — Progress Notes (Signed)
 Surgery orders requested via Epic inbox.

## 2023-08-14 NOTE — Anesthesia Preprocedure Evaluation (Addendum)
 Anesthesia Evaluation  Patient identified by MRN, date of birth, ID band Patient awake    Reviewed: Allergy & Precautions, NPO status , Patient's Chart, lab work & pertinent test results  Airway Mallampati: II  TM Distance: >3 FB Neck ROM: Full    Dental  (+) Dental Advisory Given, Missing, Upper Dentures   Pulmonary former smoker   Pulmonary exam normal breath sounds clear to auscultation       Cardiovascular hypertension, Pt. on medications + CAD, + Past MI, + Cardiac Stents, + CABG, + Peripheral Vascular Disease (s/p EVAR) and +CHF  Normal cardiovascular exam+ dysrhythmias Atrial Fibrillation  Rhythm:Regular Rate:Normal  Echo 03/16/23: 1. Technically difficult study with poor visualization of endocardial  borders.   2. Left ventricular ejection fraction, by estimation, is 35%. The left  ventricle has moderate to severely decreased function. The left ventricle  demonstrates regional wall motion abnormalities with hypokinesis of the  inferior walls. The left ventricular   internal cavity size was moderately to severely dilated. There is  moderate left ventricular hypertrophy. Left ventricular diastolic  parameters are consistent with Grade I diastolic dysfunction (impaired  relaxation).   3. Right ventricular systolic function is mildly reduced. The right  ventricular size is normal.   4. Left atrial size was mildly dilated.   5. The mitral valve is grossly normal. Mild mitral valve regurgitation.   6. The aortic valve is grossly normal. There is mild calcification of the  aortic valve. Aortic valve regurgitation is not visualized.   7. The inferior vena cava is normal in size with greater than 50%  respiratory variability, suggesting right atrial pressure of 3 mmHg.     Neuro/Psych  PSYCHIATRIC DISORDERS Anxiety Depression    negative neurological ROS     GI/Hepatic Neg liver ROS,GERD  Medicated,,  Endo/Other   diabetes, Type 2, Oral Hypoglycemic Agents  Obesity   Renal/GU Renal disease     Musculoskeletal  (+) Arthritis ,  Acute medial meniscus tear of left knee   Abdominal   Peds  Hematology  (+) Blood dyscrasia (Eliquis), anemia   Anesthesia Other Findings Day of surgery medications reviewed with the patient.  Reproductive/Obstetrics                              Anesthesia Physical Anesthesia Plan  ASA: 4  Anesthesia Plan: General   Post-op Pain Management: Tylenol PO (pre-op)* and Toradol IV (intra-op)*   Induction: Intravenous  PONV Risk Score and Plan: 2 and Dexamethasone and Ondansetron  Airway Management Planned: LMA  Additional Equipment: ClearSight  Intra-op Plan:   Post-operative Plan: Extubation in OR  Informed Consent: I have reviewed the patients History and Physical, chart, labs and discussed the procedure including the risks, benefits and alternatives for the proposed anesthesia with the patient or authorized representative who has indicated his/her understanding and acceptance.     Dental advisory given  Plan Discussed with: CRNA  Anesthesia Plan Comments: (See PAT note 08/14/23)        Anesthesia Quick Evaluation

## 2023-08-14 NOTE — Progress Notes (Addendum)
 Anesthesia Chart Review   Case: [id number] Date/Time: 08/17/23 1225   Procedures:      ARTHROSCOPY, KNEE, WITH LATERAL MENISCECTOMY (Left: Knee)     DEBRIDEMENT, MENISCUS, KNEE (Left: Knee)     ARTHROSCOPY, KNEE, WITH CARTILAGE PROCUREMENT FOR CULTURE (Left)   Anesthesia type: Choice   Diagnosis: Acute medial meniscus tear of left knee, initial encounter [S83.242A]   Pre-op diagnosis: Medial Meniscal tear Left knee   Location: [facility] ROOM 10 / [facility]S   Surgeons: Ollen Gross, MD       DISCUSSION:83 y.o. former smoker with h/o HTN, CAD s/p CABG x 4 in 1999, ischemic cardiomyopathy, atrial fibrillation, s/p endovascular repair AAA 2017, DM II, left knee medial meniscal tear scheduled for above procedure 08/17/2023 with Dr. Ollen Gross.   Per cardiology preoperative evaluation 07/31/2023, "-Patient's RCRI score is 11%   The patient affirms he has been doing well without any new cardiac symptoms. They are able to achieve 6 METS without cardiac limitations. Therefore, based on ACC/AHA guidelines, the patient would be at acceptable risk for the planned procedure without further cardiovascular testing. The patient was advised that if he develops new symptoms prior to surgery to contact our office to arrange for a follow-up visit, and he verbalized understanding.    The patient was advised that if he develops new symptoms prior to surgery to contact our office to arrange for a follow-up visit, and he verbalized understanding.   -Per office protocol, patient can hold Eliquis for 2 days prior to procedure.   -Patient also on Jardiance- hold for 3 days prior"  Last dose of Eliquis AM dose 08/14/23.   VS: Ht 5\' 10"  (1.778 m)   Wt 115.7 kg   BMI 36.59 kg/m   PROVIDERS: Linus Galas, NP is PCP    LABS:  same day workup, pt not seen in clinic, labs DOS (all labs ordered are listed, but only abnormal results are displayed)  Labs Reviewed - No data to display   IMAGES:   EKG:   CV: Echo  03/16/2023 1. Technically difficult study with poor visualization of endocardial  borders.   2. Left ventricular ejection fraction, by estimation, is 35%. The left  ventricle has moderate to severely decreased function. The left ventricle  demonstrates regional wall motion abnormalities with hypokinesis of the  inferior walls. The left ventricular   internal cavity size was moderately to severely dilated. There is  moderate left ventricular hypertrophy. Left ventricular diastolic  parameters are consistent with Grade I diastolic dysfunction (impaired  relaxation).   3. Right ventricular systolic function is mildly reduced. The right  ventricular size is normal.   4. Left atrial size was mildly dilated.   5. The mitral valve is grossly normal. Mild mitral valve regurgitation.   6. The aortic valve is grossly normal. There is mild calcification of the  aortic valve. Aortic valve regurgitation is not visualized.   7. The inferior vena cava is normal in size with greater than 50%  respiratory variability, suggesting right atrial pressure of 3 mmHg.  Past Medical History:  Diagnosis Date   Aneurysm of infrarenal abdominal aorta (HCC)    a. 01/2016 s/p endovascular repair of AAA (EVAR).   Anxiety    Arthritis    Chronic combined systolic and diastolic CHF (congestive heart failure) (HCC)    a. 01/2016 Echo: EF 35-40%, diff HK, mildly dil Ao root, mild MR, mildly dil LA.   Coronary artery disease    a. 1999 s/p  CABG x4 (LIMA->LAD, VG->OM, VG->Diag, VG->RCA); b. 11/2012 Inferior STEMI/PCI: LIMA->LAD ok, VG->Diag 100, VG->OM 100, VG->RCA was culprit (PTCA/thrombectomy->Xience Xpedition DES), EF 25%.   Depression    Family history of adverse reaction to anesthesia    Daughter N&V   GERD (gastroesophageal reflux disease)    High triglycerides    Hypertension    Ischemic Cardiomyopathy    a. 01/2016 Echo: EF 35-40%, diff HK, inflat, inf AK.   Obesity    Persistent atrial fibrillation (HCC)     a. Dx 01/2016-->slow vent response on beta blocker;  b. CHA2DS2VASc = 6-->Eliquis;  c. 03/03/2016 unsuccessful DCCV.   Ruptured lumbar disc    ST elevation myocardial infarction (STEMI) of inferior wall (HCC) 12/18/2012   STEMI of inf. wall w/PCI with Xperdition stent to VG to distal RCA   Type II diabetes mellitus (HCC)     Past Surgical History:  Procedure Laterality Date   BACK SURGERY     (5) back surgeries   CARDIOVERSION N/A 03/03/2016   Procedure: CARDIOVERSION;  Surgeon: Chrystie Nose, MD;  Location: [facility] ENDOSCOPY;  Service: Cardiovascular;  Laterality: N/A;   CARDIOVERSION N/A 03/12/2016   Procedure: CARDIOVERSION;  Surgeon: Chilton Si, MD;  Location: [facility] ENDOSCOPY;  Service: Cardiovascular;  Laterality: N/A;   CATARACT EXTRACTION, BILATERAL Right 07/10/2016   left eye 07/24/16   CORONARY ARTERY BYPASS GRAFT  1999   (CAD) CABG was a 5-vessel bypass and had a questionable history of A fib. He underwent monitoring whick showed sinus bradycardia and PACs but no evidence of A fib.   ENDOVASCULAR STENT INSERTION N/A 12/28/2015   Procedure: ENDOVASCULAR STENT GRAFT INSERTION;  Surgeon: Nada Libman, MD;  Location: [facility] OR;  Service: Vascular;  Laterality: N/A;   IR ANGIOGRAM PELVIS SELECTIVE OR SUPRASELECTIVE  04/30/2018   IR ANGIOGRAM SELECTIVE EACH ADDITIONAL VESSEL  04/30/2018   IR ANGIOGRAM VISCERAL SELECTIVE  04/30/2018   IR EMBO ARTERIAL NOT HEMORR HEMANG INC GUIDE ROADMAPPING  04/30/2018   IR RADIOLOGIST EVAL & MGMT  03/24/2017   IR RADIOLOGIST EVAL & MGMT  04/14/2018   IR RADIOLOGIST EVAL & MGMT  07/08/2018   IR RADIOLOGIST EVAL & MGMT  05/11/2019   IR RADIOLOGIST EVAL & MGMT  05/31/2020   IR RADIOLOGIST EVAL & MGMT  01/10/2022   IR RADIOLOGIST EVAL & MGMT  03/13/2023   IR US GUIDE VASC ACCESS RIGHT  04/30/2018   LEFT HEART CATHETERIZATION WITH CORONARY/GRAFT ANGIOGRAM  12/18/2012   Procedure: LEFT HEART CATHETERIZATION WITH Isabel Caprice;  Surgeon: [name], MD;   Location: [facility] CATH LAB;  Service: Cardiovascular;;   LUMBAR DISC SURGERY     open heart surgery  03/1998   TONSILLECTOMY      MEDICATIONS: No current facility-administered medications for this encounter.    acetaminophen (TYLENOL) 500 MG tablet   albuterol (PROVENTIL HFA;VENTOLIN HFA) 108 (90 Base) MCG/ACT inhaler   aspirin EC 81 MG EC tablet   atorvastatin (LIPITOR) 40 MG tablet   carboxymethylcellulose (REFRESH PLUS) 0.5 % SOLN   cyanocobalamin (VITAMIN B12) 1000 MCG tablet   dofetilide (TIKOSYN) 250 MCG capsule   ELIQUIS 5 MG TABS tablet   empagliflozin (JARDIANCE) 10 MG TABS tablet   famotidine (PEPCID) 20 MG tablet   Flaxseed, Linseed, (FLAX SEED OIL PO)   isosorbide mononitrate (IMDUR) 30 MG 24 hr tablet   loratadine-pseudoephedrine (CLARITIN-D 12-HOUR) 5-120 MG tablet   LORazepam (ATIVAN) 0.5 MG tablet   Magnesium 400 MG CAPS   metFORMIN (GLUCOPHAGE-XR) 750  MG 24 hr tablet   nitroGLYCERIN (NITROSTAT) 0.4 MG SL tablet   potassium chloride SA (KLOR-CON M20) 20 MEQ tablet   sacubitril-valsartan (ENTRESTO) 24-26 MG     [facility] Ward, PA-C [facility] Pre-Surgical Testing [phone]

## 2023-08-17 ENCOUNTER — Ambulatory Visit (HOSPITAL_COMMUNITY): Payer: Self-pay | Admitting: Physician Assistant

## 2023-08-17 ENCOUNTER — Other Ambulatory Visit: Payer: Self-pay

## 2023-08-17 ENCOUNTER — Encounter (HOSPITAL_COMMUNITY): Payer: Self-pay | Admitting: Orthopedic Surgery

## 2023-08-17 ENCOUNTER — Ambulatory Visit (HOSPITAL_COMMUNITY)
Admission: RE | Admit: 2023-08-17 | Discharge: 2023-08-17 | Disposition: A | Discharge status: Home / Self Care | Attending: Orthopedic Surgery | Admitting: Orthopedic Surgery

## 2023-08-17 ENCOUNTER — Encounter (HOSPITAL_COMMUNITY)
Admission: RE | Disposition: A | Discharge status: Home / Self Care | Payer: Self-pay | Source: Home / Self Care | Attending: Orthopedic Surgery

## 2023-08-17 DIAGNOSIS — I4819 Other persistent atrial fibrillation: Secondary | ICD-10-CM | POA: Insufficient documentation

## 2023-08-17 DIAGNOSIS — I4891 Unspecified atrial fibrillation: Secondary | ICD-10-CM | POA: Insufficient documentation

## 2023-08-17 DIAGNOSIS — S83282A Other tear of lateral meniscus, current injury, left knee, initial encounter: Secondary | ICD-10-CM

## 2023-08-17 DIAGNOSIS — I714 Abdominal aortic aneurysm, without rupture, unspecified: Secondary | ICD-10-CM | POA: Diagnosis not present

## 2023-08-17 DIAGNOSIS — I5042 Chronic combined systolic (congestive) and diastolic (congestive) heart failure: Secondary | ICD-10-CM | POA: Insufficient documentation

## 2023-08-17 DIAGNOSIS — S83242A Other tear of medial meniscus, current injury, left knee, initial encounter: Secondary | ICD-10-CM

## 2023-08-17 DIAGNOSIS — F32A Depression, unspecified: Secondary | ICD-10-CM | POA: Insufficient documentation

## 2023-08-17 DIAGNOSIS — Z01818 Encounter for other preprocedural examination: Secondary | ICD-10-CM

## 2023-08-17 DIAGNOSIS — Z6836 Body mass index (BMI) 36.0-36.9, adult: Secondary | ICD-10-CM | POA: Diagnosis not present

## 2023-08-17 DIAGNOSIS — E669 Obesity, unspecified: Secondary | ICD-10-CM | POA: Insufficient documentation

## 2023-08-17 DIAGNOSIS — Z955 Presence of coronary angioplasty implant and graft: Secondary | ICD-10-CM | POA: Insufficient documentation

## 2023-08-17 DIAGNOSIS — M94262 Chondromalacia, left knee: Secondary | ICD-10-CM | POA: Diagnosis not present

## 2023-08-17 DIAGNOSIS — I2510 Atherosclerotic heart disease of native coronary artery without angina pectoris: Secondary | ICD-10-CM

## 2023-08-17 DIAGNOSIS — D649 Anemia, unspecified: Secondary | ICD-10-CM | POA: Diagnosis not present

## 2023-08-17 DIAGNOSIS — I252 Old myocardial infarction: Secondary | ICD-10-CM | POA: Insufficient documentation

## 2023-08-17 DIAGNOSIS — F419 Anxiety disorder, unspecified: Secondary | ICD-10-CM | POA: Insufficient documentation

## 2023-08-17 DIAGNOSIS — Z951 Presence of aortocoronary bypass graft: Secondary | ICD-10-CM | POA: Diagnosis not present

## 2023-08-17 DIAGNOSIS — I110 Hypertensive heart disease with heart failure: Secondary | ICD-10-CM | POA: Insufficient documentation

## 2023-08-17 DIAGNOSIS — I5041 Acute combined systolic (congestive) and diastolic (congestive) heart failure: Secondary | ICD-10-CM | POA: Diagnosis not present

## 2023-08-17 DIAGNOSIS — M23252 Derangement of posterior horn of lateral meniscus due to old tear or injury, left knee: Secondary | ICD-10-CM | POA: Diagnosis not present

## 2023-08-17 DIAGNOSIS — Z87891 Personal history of nicotine dependence: Secondary | ICD-10-CM | POA: Diagnosis not present

## 2023-08-17 DIAGNOSIS — Z7901 Long term (current) use of anticoagulants: Secondary | ICD-10-CM | POA: Diagnosis not present

## 2023-08-17 DIAGNOSIS — M1990 Unspecified osteoarthritis, unspecified site: Secondary | ICD-10-CM | POA: Diagnosis not present

## 2023-08-17 DIAGNOSIS — M23222 Derangement of posterior horn of medial meniscus due to old tear or injury, left knee: Secondary | ICD-10-CM | POA: Diagnosis not present

## 2023-08-17 DIAGNOSIS — X58XXXA Exposure to other specified factors, initial encounter: Secondary | ICD-10-CM | POA: Insufficient documentation

## 2023-08-17 DIAGNOSIS — Z7984 Long term (current) use of oral hypoglycemic drugs: Secondary | ICD-10-CM | POA: Diagnosis not present

## 2023-08-17 DIAGNOSIS — I739 Peripheral vascular disease, unspecified: Secondary | ICD-10-CM | POA: Insufficient documentation

## 2023-08-17 DIAGNOSIS — E119 Type 2 diabetes mellitus without complications: Secondary | ICD-10-CM | POA: Diagnosis not present

## 2023-08-17 DIAGNOSIS — I255 Ischemic cardiomyopathy: Secondary | ICD-10-CM | POA: Insufficient documentation

## 2023-08-17 DIAGNOSIS — K219 Gastro-esophageal reflux disease without esophagitis: Secondary | ICD-10-CM | POA: Insufficient documentation

## 2023-08-17 DIAGNOSIS — S83249A Other tear of medial meniscus, current injury, unspecified knee, initial encounter: Secondary | ICD-10-CM | POA: Diagnosis present

## 2023-08-17 HISTORY — DX: Depression, unspecified: F32.A

## 2023-08-17 HISTORY — DX: Anxiety disorder, unspecified: F41.9

## 2023-08-17 HISTORY — DX: Family history of other specified conditions: Z84.89

## 2023-08-17 HISTORY — PX: KNEE ARTHROSCOPY WITH LATERAL MENISECTOMY: SHX6193

## 2023-08-17 HISTORY — PX: MENISCUS DEBRIDEMENT: SHX5178

## 2023-08-17 LAB — CBC
HCT: 40.4 % (ref 39.0–52.0)
Hemoglobin: 12.3 g/dL — ABNORMAL LOW (ref 13.0–17.0)
MCH: 27.3 pg (ref 26.0–34.0)
MCHC: 30.4 g/dL (ref 30.0–36.0)
MCV: 89.8 fL (ref 80.0–100.0)
Platelets: 242 10*3/uL (ref 150–400)
RBC: 4.5 MIL/uL (ref 4.22–5.81)
RDW: 17.2 % — ABNORMAL HIGH (ref 11.5–15.5)
WBC: 8.4 10*3/uL (ref 4.0–10.5)
nRBC: 0 % (ref 0.0–0.2)

## 2023-08-17 LAB — BASIC METABOLIC PANEL
Anion gap: 9 (ref 5–15)
BUN: 20 mg/dL (ref 8–23)
CO2: 24 mmol/L (ref 22–32)
Calcium: 8.4 mg/dL — ABNORMAL LOW (ref 8.9–10.3)
Chloride: 107 mmol/L (ref 98–111)
Creatinine, Ser: 1.08 mg/dL (ref 0.61–1.24)
GFR, Estimated: >60 mL/min (ref >60)
Glucose, Bld: 126 mg/dL — ABNORMAL HIGH (ref 70–99)
Potassium: 4.1 mmol/L (ref 3.5–5.1)
Sodium: 140 mmol/L (ref 135–145)

## 2023-08-17 LAB — GLUCOSE, CAPILLARY: Glucose-Capillary: 119 mg/dL — ABNORMAL HIGH (ref 70–99)

## 2023-08-17 SURGERY — ARTHROSCOPY, KNEE, WITH LATERAL MENISCECTOMY
Anesthesia: General | Site: Knee | Laterality: Left

## 2023-08-17 MED ORDER — ACETAMINOPHEN 10 MG/ML IV SOLN
INTRAVENOUS | Status: DC | PRN
  Administered 2023-08-17: 1000 mg via INTRAVENOUS

## 2023-08-17 MED ORDER — ATROPINE SULFATE 1 MG/10ML IJ SOSY
PREFILLED_SYRINGE | INTRAMUSCULAR | Status: AC
  Filled 2023-08-17: qty 10

## 2023-08-17 MED ORDER — FENTANYL CITRATE (PF) 100 MCG/2ML IJ SOLN
INTRAMUSCULAR | Status: AC
  Filled 2023-08-17: qty 2

## 2023-08-17 MED ORDER — PROPOFOL 500 MG/50ML IV EMUL
INTRAVENOUS | Status: DC | PRN
  Administered 2023-08-17 (×2): 50 mg via INTRAVENOUS

## 2023-08-17 MED ORDER — SODIUM CHLORIDE 0.9 % IR SOLN
Status: DC | PRN
  Administered 2023-08-17: 9000 mL

## 2023-08-17 MED ORDER — ONDANSETRON HCL 4 MG/2ML IJ SOLN
INTRAMUSCULAR | Status: DC | PRN
Start: 2023-08-17 — End: 2023-08-17
  Administered 2023-08-17: 4 mg via INTRAVENOUS

## 2023-08-17 MED ORDER — SODIUM CHLORIDE 0.9 % IV SOLN: INTRAVENOUS | Status: DC | PRN

## 2023-08-17 MED ORDER — PHENYLEPHRINE 80 MCG/ML (10ML) SYRINGE FOR IV PUSH (FOR BLOOD PRESSURE SUPPORT)
PREFILLED_SYRINGE | INTRAVENOUS | Status: AC
  Filled 2023-08-17: qty 10

## 2023-08-17 MED ORDER — LIDOCAINE HCL (PF) 2 % IJ SOLN
INTRAMUSCULAR | Status: DC | PRN
  Administered 2023-08-17: 100 mg via INTRADERMAL

## 2023-08-17 MED ORDER — BUPIVACAINE-EPINEPHRINE 0.25% -1:200000 IJ SOLN
INTRAMUSCULAR | Status: DC | PRN
  Administered 2023-08-17: 20 mL

## 2023-08-17 MED ORDER — CHLORHEXIDINE GLUCONATE 0.12 % MT SOLN
15.0000 mL | Freq: Once | OROMUCOSAL | Status: AC
  Administered 2023-08-17: 15 mL via OROMUCOSAL

## 2023-08-17 MED ORDER — DEXMEDETOMIDINE HCL IN NACL 80 MCG/20ML IV SOLN
INTRAVENOUS | Status: AC
  Filled 2023-08-17: qty 20

## 2023-08-17 MED ORDER — FENTANYL CITRATE PF 50 MCG/ML IJ SOSY: 100.0000 ug | PREFILLED_SYRINGE | Freq: Once | INTRAMUSCULAR | Status: DC

## 2023-08-17 MED ORDER — ONDANSETRON HCL 4 MG/2ML IJ SOLN: 4.0000 mg | Freq: Once | INTRAMUSCULAR | Status: DC | PRN

## 2023-08-17 MED ORDER — MIDAZOLAM HCL 2 MG/2ML IJ SOLN: 2.0000 mg | Freq: Once | INTRAMUSCULAR | Status: DC

## 2023-08-17 MED ORDER — FENTANYL CITRATE PF 50 MCG/ML IJ SOSY: 25.0000 ug | PREFILLED_SYRINGE | INTRAMUSCULAR | Status: DC | PRN

## 2023-08-17 MED ORDER — METHOCARBAMOL 500 MG PO TABS: 500.0000 mg | ORAL_TABLET | Freq: Four times a day (QID) | ORAL | 0 refills | Status: AC | PRN

## 2023-08-17 MED ORDER — PROPOFOL 1000 MG/100ML IV EMUL
INTRAVENOUS | Status: AC
  Filled 2023-08-17: qty 100

## 2023-08-17 MED ORDER — HYDROCODONE-ACETAMINOPHEN 5-325 MG PO TABS
1.0000 | ORAL_TABLET | Freq: Four times a day (QID) | ORAL | 0 refills | Status: AC | PRN
Start: 2023-08-17 — End: ?

## 2023-08-17 MED ORDER — BUPIVACAINE-EPINEPHRINE (PF) 0.25% -1:200000 IJ SOLN
INTRAMUSCULAR | Status: AC
  Filled 2023-08-17: qty 30

## 2023-08-17 MED ORDER — VASOPRESSIN 20 UNIT/ML IV SOLN
INTRAVENOUS | Status: AC
Start: 2023-08-17 — End: ?
  Filled 2023-08-17: qty 1

## 2023-08-17 MED ORDER — PROPOFOL 10 MG/ML IV BOLUS
INTRAVENOUS | Status: AC
  Filled 2023-08-17: qty 20

## 2023-08-17 MED ORDER — APIXABAN 5 MG PO TABS: 5.0000 mg | ORAL_TABLET | Freq: Two times a day (BID) | ORAL | Status: DC

## 2023-08-17 MED ORDER — INSULIN ASPART 100 UNIT/ML IJ SOLN: 0.0000 [IU] | INTRAMUSCULAR | Status: DC | PRN

## 2023-08-17 MED ORDER — PHENYLEPHRINE HCL (PRESSORS) 10 MG/ML IV SOLN
INTRAVENOUS | Status: AC
  Filled 2023-08-17: qty 1

## 2023-08-17 MED ORDER — DEXAMETHASONE SODIUM PHOSPHATE 10 MG/ML IJ SOLN: 8.0000 mg | Freq: Once | INTRAMUSCULAR | Status: DC

## 2023-08-17 MED ORDER — LACTATED RINGERS IV SOLN
INTRAVENOUS | Status: DC
Start: 2023-08-17 — End: 2023-08-17

## 2023-08-17 MED ORDER — ETOMIDATE 2 MG/ML IV SOLN
INTRAVENOUS | Status: AC
  Filled 2023-08-17: qty 10

## 2023-08-17 MED ORDER — CEFAZOLIN SODIUM-DEXTROSE 2-4 GM/100ML-% IV SOLN
2.0000 g | INTRAVENOUS | Status: AC
  Administered 2023-08-17: 2 g via INTRAVENOUS
  Filled 2023-08-17: qty 100

## 2023-08-17 MED ORDER — FENTANYL CITRATE (PF) 100 MCG/2ML IJ SOLN
INTRAMUSCULAR | Status: DC | PRN
Start: 2023-08-17 — End: 2023-08-17
  Administered 2023-08-17 (×2): 50 ug via INTRAVENOUS

## 2023-08-17 MED ORDER — ORAL CARE MOUTH RINSE: 15.0000 mL | Freq: Once | OROMUCOSAL | Status: AC

## 2023-08-17 MED ORDER — PHENYLEPHRINE HCL (PRESSORS) 10 MG/ML IV SOLN
INTRAVENOUS | Status: DC | PRN
  Administered 2023-08-17: 160 ug via INTRAVENOUS

## 2023-08-17 MED ORDER — PHENYLEPHRINE HCL (PRESSORS) 10 MG/ML IV SOLN
INTRAVENOUS | Status: AC
Start: 2023-08-17 — End: ?
  Filled 2023-08-17: qty 1

## 2023-08-17 MED ORDER — LIDOCAINE HCL (PF) 2 % IJ SOLN
INTRAMUSCULAR | Status: AC
  Filled 2023-08-17: qty 5

## 2023-08-17 MED ORDER — ACETAMINOPHEN 10 MG/ML IV SOLN
1000.0000 mg | Freq: Four times a day (QID) | INTRAVENOUS | Status: DC
  Filled 2023-08-17: qty 100

## 2023-08-17 SURGICAL SUPPLY — 31 items
BAG COUNTER SPONGE SURGICOUNT (BAG) IMPLANT
BLADE SHAVER TORPEDO 4X13 (MISCELLANEOUS) ×2 IMPLANT
BNDG ELASTIC 6INX 5YD STR LF (GAUZE/BANDAGES/DRESSINGS) ×2 IMPLANT
COVER SURGICAL LIGHT HANDLE (MISCELLANEOUS) ×2 IMPLANT
DRAPE ARTHROSCOPY W/POUCH 114 (DRAPES) ×2 IMPLANT
DRAPE TOP 10253 STERILE (DRAPES) ×1 IMPLANT
DRAPE U-SHAPE 47X51 STRL (DRAPES) ×2 IMPLANT
DRSG EMULSION OIL 3X3 NADH (GAUZE/BANDAGES/DRESSINGS) ×2 IMPLANT
DURAPREP 26ML APPLICATOR (WOUND CARE) ×2 IMPLANT
GAUZE 4X4 16PLY ~~LOC~~+RFID DBL (SPONGE) ×2 IMPLANT
GAUZE PAD ABD 8X10 STRL (GAUZE/BANDAGES/DRESSINGS) ×2 IMPLANT
GAUZE SPONGE 4X4 12PLY STRL (GAUZE/BANDAGES/DRESSINGS) ×2 IMPLANT
GLOVE BIO SURGEON STRL SZ 6.5 (GLOVE) ×4 IMPLANT
GLOVE BIO SURGEON STRL SZ8 (GLOVE) ×4 IMPLANT
GLOVE BIOGEL PI IND STRL 7.0 (GLOVE) ×4 IMPLANT
GLOVE BIOGEL PI IND STRL 8 (GLOVE) ×2 IMPLANT
GLOVE SURG SS PI 7.0 STRL IVOR (GLOVE) ×2 IMPLANT
GOWN STRL REUS W/ TWL LRG LVL3 (GOWN DISPOSABLE) ×2 IMPLANT
KIT BASIN OR (CUSTOM PROCEDURE TRAY) ×2 IMPLANT
KIT TURNOVER KIT A (KITS) IMPLANT
MANIFOLD NEPTUNE II (INSTRUMENTS) ×2 IMPLANT
PACK ARTHROSCOPY WL (CUSTOM PROCEDURE TRAY) ×2 IMPLANT
PADDING CAST ABS COTTON 6X4 NS (CAST SUPPLIES) ×1 IMPLANT
PADDING CAST COTTON 6X4 STRL (CAST SUPPLIES) ×2 IMPLANT
PROTECTOR NERVE ULNAR (MISCELLANEOUS) ×2 IMPLANT
SUT ETHILON 4 0 PS 2 18 (SUTURE) ×2 IMPLANT
TOWEL OR 17X26 10 PK STRL BLUE (TOWEL DISPOSABLE) ×2 IMPLANT
TUBING ARTHROSCOPY IRRIG 16FT (MISCELLANEOUS) ×2 IMPLANT
TUBING CONNECTING 10 (TUBING) ×2 IMPLANT
WAND ABLATOR APOLLO I90 (BUR) ×2 IMPLANT
WRAP KNEE MAXI GEL POST OP (GAUZE/BANDAGES/DRESSINGS) ×2 IMPLANT

## 2023-08-17 NOTE — H&P (Signed)
 CC- Ryan Wise is a 83 y.o. male who presents with left knee pain.  HPI- . Knee Pain: Patient presents with knee pain involving the  left knee. Onset of the symptoms was several weeks ago. Inciting event: none known. Current symptoms include giving out, pain located medial and lateral, stiffness, and swelling. Pain is aggravated by going up and down stairs, kneeling, lateral movements, pivoting, rising after sitting, and standing.  Patient has had prior knee problems. Evaluation to date: MRI: abnormal medial and lateral meniscal tears . Treatment to date:  aspiration and cortisone injection .  Past Medical History:  Diagnosis Date   Aneurysm of infrarenal abdominal aorta (HCC)    a. 01/2016 s/p endovascular repair of AAA (EVAR).   Anxiety    Arthritis    Chronic combined systolic and diastolic CHF (congestive heart failure) (HCC)    a. 01/2016 Echo: EF 35-40%, diff HK, mildly dil Ao root, mild MR, mildly dil LA.   Coronary artery disease    a. 1999 s/p CABG x4 (LIMA->LAD, VG->OM, VG->Diag, VG->RCA); b. 11/2012 Inferior STEMI/PCI: LIMA->LAD ok, VG->Diag 100, VG->OM 100, VG->RCA was culprit (PTCA/thrombectomy->Xience Xpedition DES), EF 25%.   Depression    Family history of adverse reaction to anesthesia    Daughter N&V   GERD (gastroesophageal reflux disease)    High triglycerides    Hypertension    Ischemic Cardiomyopathy    a. 01/2016 Echo: EF 35-40%, diff HK, inflat, inf AK.   Obesity    Persistent atrial fibrillation (HCC)    a. Dx 01/2016-->slow vent response on beta blocker;  b. CHA2DS2VASc = 6-->Eliquis;  c. 03/03/2016 unsuccessful DCCV.   Ruptured lumbar disc    ST elevation myocardial infarction (STEMI) of inferior wall (HCC) 12/18/2012   STEMI of inf. wall w/PCI with Xperdition stent to VG to distal RCA   Type II diabetes mellitus (HCC)     Past Surgical History:  Procedure Laterality Date   BACK SURGERY     (5) back surgeries   CARDIOVERSION N/A 03/03/2016   Procedure:  CARDIOVERSION;  Surgeon: Chrystie Nose, MD;  Location: [facility] ENDOSCOPY;  Service: Cardiovascular;  Laterality: N/A;   CARDIOVERSION N/A 03/12/2016   Procedure: CARDIOVERSION;  Surgeon: Chilton Si, MD;  Location: [facility] ENDOSCOPY;  Service: Cardiovascular;  Laterality: N/A;   CATARACT EXTRACTION, BILATERAL Right 07/10/2016   left eye 07/24/16   CORONARY ARTERY BYPASS GRAFT  1999   (CAD) CABG was a 5-vessel bypass and had a questionable history of A fib. He underwent monitoring whick showed sinus bradycardia and PACs but no evidence of A fib.   ENDOVASCULAR STENT INSERTION N/A 12/28/2015   Procedure: ENDOVASCULAR STENT GRAFT INSERTION;  Surgeon: Nada Libman, MD;  Location: [facility] OR;  Service: Vascular;  Laterality: N/A;   IR ANGIOGRAM PELVIS SELECTIVE OR SUPRASELECTIVE  04/30/2018   IR ANGIOGRAM SELECTIVE EACH ADDITIONAL VESSEL  04/30/2018   IR ANGIOGRAM VISCERAL SELECTIVE  04/30/2018   IR EMBO ARTERIAL NOT HEMORR HEMANG INC GUIDE ROADMAPPING  04/30/2018   IR RADIOLOGIST EVAL & MGMT  03/24/2017   IR RADIOLOGIST EVAL & MGMT  04/14/2018   IR RADIOLOGIST EVAL & MGMT  07/08/2018   IR RADIOLOGIST EVAL & MGMT  05/11/2019   IR RADIOLOGIST EVAL & MGMT  05/31/2020   IR RADIOLOGIST EVAL & MGMT  01/10/2022   IR RADIOLOGIST EVAL & MGMT  03/13/2023   IR US GUIDE VASC ACCESS RIGHT  04/30/2018   LEFT HEART CATHETERIZATION WITH CORONARY/GRAFT ANGIOGRAM  12/18/2012  Procedure: LEFT HEART CATHETERIZATION WITH Isabel Caprice;  Surgeon: [name], MD;  Location: [facility] CATH LAB;  Service: Cardiovascular;;   LUMBAR DISC SURGERY     open heart surgery  03/1998   TONSILLECTOMY      Prior to Admission medications   Medication Sig Start Date End Date Taking? Authorizing Provider  acetaminophen (TYLENOL) 500 MG tablet Take 1,500 mg by mouth 2 (two) times daily.   Yes [provider]  albuterol (PROVENTIL HFA;VENTOLIN HFA) 108 (90 Base) MCG/ACT inhaler Inhale 1 puff into the lungs every 6 (six) hours as  needed for wheezing or shortness of breath.   Yes [provider]  aspirin EC 81 MG EC tablet Take 1 tablet (81 mg total) by mouth daily. 12/22/12  Yes Kilroy, Luke K, PA-C  atorvastatin (LIPITOR) 40 MG tablet TAKE 1 TABLET BY MOUTH DAILY AT 6 PM. 10/17/22  Yes Hilty, Lisette Abu, MD  carboxymethylcellulose (REFRESH PLUS) 0.5 % SOLN Place 1 drop into both eyes 3 (three) times daily as needed (DRY EYES).   Yes [provider]  cyanocobalamin (VITAMIN B12) 1000 MCG tablet Take 1,000 mcg by mouth daily.   Yes [provider]  dofetilide (TIKOSYN) 250 MCG capsule TAKE 1 CAPSULE BY MOUTH 2 TIMES DAILY. 02/06/23  Yes Hilty, Lisette Abu, MD  ELIQUIS 5 MG TABS tablet TAKE 1 TABLET BY MOUTH TWICE A DAY 05/01/23  Yes Hilty, Lisette Abu, MD  empagliflozin (JARDIANCE) 10 MG TABS tablet Take 1 tablet (10 mg total) by mouth daily before breakfast. 03/17/23  Yes Hilty, Lisette Abu, MD  famotidine (PEPCID) 20 MG tablet Take 20 mg by mouth 2 (two) times daily.   Yes [provider]  Flaxseed, Linseed, (FLAX SEED OIL PO) Take 2 capsules by mouth 2 (two) times daily.   Yes [provider]  isosorbide mononitrate (IMDUR) 30 MG 24 hr tablet TAKE 1 TABLET BY MOUTH EVERY DAY 02/26/23  Yes Hilty, Lisette Abu, MD  loratadine-pseudoephedrine (CLARITIN-D 12-HOUR) 5-120 MG tablet Take 1 tablet by mouth every evening.   Yes [provider]  LORazepam (ATIVAN) 0.5 MG tablet Take 0.5 mg by mouth 2 (two) times daily. 01/07/13  Yes Hilty, Lisette Abu, MD  Magnesium 400 MG CAPS Take 400 mg by mouth 2 (two) times daily. 07/31/16  Yes Chrystie Nose, MD  metFORMIN (GLUCOPHAGE-XR) 750 MG 24 hr tablet 1 TABLET WITH EVENING MEAL ORALLY ONCE A DAY 90 04/09/23  Yes Hilty, Lisette Abu, MD  nitroGLYCERIN (NITROSTAT) 0.4 MG SL tablet Place 1 tablet (0.4 mg total) under the tongue every 5 (five) minutes as needed for chest pain. 12/22/12  Yes Kilroy, Luke K, PA-C  potassium chloride SA (KLOR-CON M20) 20 MEQ  tablet TAKE 1.5 TABLETS (30 MEQ TOTAL) BY MOUTH DAILY. 07/10/23  Yes Hilty, Lisette Abu, MD  sacubitril-valsartan (ENTRESTO) 24-26 MG Take 1 tablet by mouth 2 (two) times daily. 03/17/23  Yes Hilty, Lisette Abu, MD   Left knee exam antalgic gait, effusion, negative drawer sign, collateral ligaments intact, medial and lateral joint line tenderness  Physical Examination: General appearance - alert, well appearing, and in no distress Mental status - alert, oriented to person, place, and time Chest - clear to auscultation, no wheezes, rales or rhonchi, symmetric air entry Heart - normal rate, regular rhythm, normal S1, S2, no murmurs, rubs, clicks or gallops Abdomen - soft, nontender, nondistended, no masses or organomegaly Neurological - alert, oriented, normal speech, no focal findings or movement disorder noted  Asessment/Plan--- Left knee medial meniscal tear- - Plan left knee arthroscopy with meniscal debridement. Procedure risks and potential comps discussed with patient who elects to proceed. Goals are decreased pain and increased function with a high likelihood of achieving both

## 2023-08-17 NOTE — Discharge Instructions (Addendum)
 Dr. Ollen Gross Total Joint Specialist [facility] [address], [address] [place], [place] [address] [phone]   Arthroscopic Procedure, Knee An arthroscopic procedure can find what is wrong with your knee. PROCEDURE Arthroscopy is a surgical technique that allows your orthopedic surgeon to diagnose and treat your knee injury with accuracy. They will look into your knee through a small instrument. This is almost like a small (pencil sized) telescope. Because arthroscopy affects your knee less than open knee surgery, you can anticipate a more rapid recovery. Taking an active role by following your caregiver's instructions will help with rapid and complete recovery. Use crutches, rest, elevation, ice, and knee exercises as instructed. The length of recovery depends on various factors including type of injury, age, physical condition, medical conditions, and your rehabilitation. Your knee is the joint between the large bones (femur and tibia) in your leg. Cartilage covers these bone ends which are smooth and slippery and allow your knee to bend and move smoothly. Two menisci, thick, semi-lunar shaped pads of cartilage which form a rim inside the joint, help absorb shock and stabilize your knee. Ligaments bind the bones together and support your knee joint. Muscles move the joint, help support your knee, and take stress off the joint itself. Because of this all programs and physical therapy to rehabilitate an injured or repaired knee require rebuilding and strengthening your muscles.  AFTER THE PROCEDURE After the procedure, you will be moved to a recovery area until most of the effects of the medication have worn off. Your caregiver will discuss the test results with you.  Only take over-the-counter or prescription medicines for pain, discomfort, or fever as directed by your caregiver.   BLOOD CLOT PREVENTION Resume Eliquis the day after your surgery. You may resume your  vitamins/supplements the day after surgery. Do not take any NSAIDs (Advil, Aleve, Ibuprofen, Meloxicam, etc.) while taking Eliquis.     SEEK MEDICAL CARE IF:  You have increased bleeding from your wounds.  You see redness, swelling, or have increasing pain in your wounds.  You have pus coming from your wound.  You have an oral temperature above 102 F (38.9 C).  You notice a bad smell coming from the wound or dressing.  You have severe pain with any motion of your knee.   SEEK IMMEDIATE MEDICAL CARE IF:  You develop a rash.  You have difficulty breathing.  You have any allergic problems.   FURTHER INSTRUCTIONS:  ICE to the affected knee every three hours for 30 minutes at a time and then as needed for pain and swelling.  Continue to use ice on the knee for pain and swelling from surgery. You may notice swelling that will progress down to the foot and ankle.  This is normal after surgery.  Elevate the leg when you are not up walking on it.    DIET You may resume your previous home diet once your are discharged from the hospital.  DRESSING / WOUND CARE / SHOWERING You may change your dressing 3-5 days after surgery.  Then change the dressing every day with sterile gauze.  Please use good hand washing techniques before changing the dressing.  Do not use any lotions or creams on the incision until instructed by your surgeon. You may start showering two days after being discharged home but do not submerge the incisions under water.  Change dressing 48 hours after the procedure and then cover the small incisions with band aids until your follow  up visit. Change the surgical dressings daily and reapply a dry dressing each time.   ACTIVITY Walk with your walker as instructed. Use walker as long as suggested by your caregivers. Avoid periods of inactivity such as sitting longer than an hour when not asleep. This helps prevent blood clots.  You may resume a sexual relationship in one month or  when given the OK by your doctor.  You may return to work once you are cleared by your doctor.  Do not drive a car for 6 weeks or until released by you surgeon.  Do not drive while taking narcotics.  WEIGHT BEARING Weight bearing as tolerated with assist device (walker, cane, etc) as directed, use it as long as suggested by your surgeon or therapist, typically at least 4-6 weeks.  POSTOPERATIVE CONSTIPATION PROTOCOL Constipation - defined medically as fewer than three stools per week and severe constipation as less than one stool per week.  One of the most common issues patients have following surgery is constipation.  Even if you have a regular bowel pattern at home, your normal regimen is likely to be disrupted due to multiple reasons following surgery.  Combination of anesthesia, postoperative narcotics, change in appetite and fluid intake all can affect your bowels.  In order to avoid complications following surgery, here are some recommendations in order to help you during your recovery period.  Colace (docusate) - Pick up an over-the-counter form of Colace or another stool softener and take twice a day as long as you are requiring postoperative pain medications.  Take with a full glass of water daily.  If you experience loose stools or diarrhea, hold the colace until you stool forms back up.  If your symptoms do not get better within 1 week or if they get worse, check with your doctor.  Dulcolax (bisacodyl) - Pick up over-the-counter and take as directed by the product packaging as needed to assist with the movement of your bowels.  Take with a full glass of water.  Use this product as needed if not relieved by Colace only.   MiraLax (polyethylene glycol) - Pick up over-the-counter to have on hand.  MiraLax is a solution that will increase the amount of water in your bowels to assist with bowel movements.  Take as directed and can mix with a glass of water, juice, soda, coffee, or tea.  Take  if you go more than two days without a movement. Do not use MiraLax more than once per day. Call your doctor if you are still constipated or irregular after using this medication for 7 days in a row.  If you continue to have problems with postoperative constipation, please contact the office for further assistance and recommendations.  If you experience "the worst abdominal pain ever" or develop nausea or vomiting, please contact the office immediatly for further recommendations for treatment.  ITCHING  If you experience itching with your medications, try taking only a single pain pill, or even half a pain pill at a time.  You can also use Benadryl over the counter for itching or also to help with sleep.   TED HOSE STOCKINGS Wear the elastic stockings on both legs for three weeks following surgery during the day but you may remove then at night for sleeping.  MEDICATIONS See your medication summary on the "After Visit Summary" that the nursing staff will review with you prior to discharge.  You may have some home medications which will be placed on hold until  you complete the course of blood thinner medication.  It is important for you to complete the blood thinner medication as prescribed by your surgeon.  Continue your approved medications as instructed at time of discharge. Do not drive while taking narcotics.   PRECAUTIONS If you experience chest pain or shortness of breath - call 911 immediately for transfer to the hospital emergency department.  If you develop a fever greater that 101 F, purulent drainage from wound, increased redness or drainage from wound, foul odor from the wound/dressing, or calf pain - CONTACT YOUR SURGEON.                                                   FOLLOW-UP APPOINTMENTS Make sure you keep all of your appointments after your operation with your surgeon and caregivers. You should call the office at [phone]  and make an appointment for approximately one  week after the date of your surgery or on the date instructed by your surgeon outlined in the "After Visit Summary".  RANGE OF MOTION AND STRENGTHENING EXERCISES  Rehabilitation of the knee is important following a knee injury or an operation. After just a few days of immobilization, the muscles of the thigh which control the knee become weakened and shrink (atrophy). Knee exercises are designed to build up the tone and strength of the thigh muscles and to improve knee motion. Often times heat used for twenty to thirty minutes before working out will loosen up your tissues and help with improving the range of motion but do not use heat for the first two weeks following surgery. These exercises can be done on a training (exercise) mat, on the floor, on a table or on a bed. Use what ever works the best and is most comfortable for you Knee exercises include:  QUAD STRENGTHENING EXERCISES Strengthening Quadriceps Sets  Tighten muscles on top of thigh by pushing knees down into floor or table. Hold for 20 seconds. Repeat 10 times. Do 2 sessions per day.     Strengthening Terminal Knee Extension  With knee bent over bolster, straighten knee by tightening muscle on top of thigh. Be sure to keep bottom of knee on bolster. Hold for 20 seconds. Repeat 10 times. Do 2 sessions per day.   Straight Leg with Bent Knee  Lie on back with opposite leg bent. Keep involved knee slightly bent at knee and raise leg 4-6". Hold for 10 seconds. Repeat 20 times per set. Do 2 sets per session. Do 2 sessions per day.

## 2023-08-17 NOTE — Op Note (Signed)
 Operative Report- KNEE ARTHROSCOPY  Preoperative diagnosis-  Left knee medial and lateral meniscal tears  Postoperative diagnosis Left- knee medial and lateral meniscal tears   Procedure- Left knee arthroscopy with medial and lateral  meniscal debridement   Surgeon- Gus Rankin. Ryott Rafferty, MD  Anesthesia-General  EBL-  Minimal  Complications- None  Condition- PACU - hemodynamically stable.  Brief clinical note- -Ryan Wise is a 83 y.o.  male with a several month history of Left pain and mechanical symptoms. Exam and history suggested medial and lateral  meniscal tears confirmed by MRI. The patient presents now for arthroscopy and debridement   Procedure in detail -       After successful administration of General anesthetic, the Left lower extremity is prepped and draped in the usual sterile fashion. Time out is performed by the surgical team. Standard superomedial and inferolateral portal sites are marked and incisions made with an 11 blade. The inflow cannula is passed through the superomedial portal and camera through the inferolateral portal and inflow is initiated. Arthroscopic visualization proceeds.      The undersurface of the patella and trochlea are visualized and there is mild chondromalacia but no full thickness chondral defects. The medial and lateral gutters are visualized and there are no loose bodies. Flexion and valgus force is applied to the knee and the medial compartment is entered. A spinal needle is passed into the joint through the site marked for the inferomedial portal. A small incision is made and the dilator passed into the joint. The findings for the medial compartment are tear of body and posterior horn medial meniscus with mild chondromalacia and no unstable cartilage defects . The tear is debrided to a stable base with baskets and a shaver and sealed off with the Arthrocare.  It is probed and found to be stable.    The intercondylar notch is visualized and the  ACL appears normal . The lateral compartment is entered and the findings are unstable tear of body and posterior horn lateral meniscus without chondral defects . The tear is debrided to a stable base with baskets and a shaver and sealed off with the Arthrocare. It is probed and found to be stable.     The joint is again inspected and there are no other tears, defects or loose bodies identified. The arthroscopic equipment is then removed from the inferior portals which are closed with interrupted 4-0 nylon. 20 ml of .25% Marcaine with epinephrine are injected through the inflow cannula and the cannula is then removed and the portal closed with nylon. The incisions are cleaned and dried and a bulky sterile dressing is applied. The patient is then awakened and transported to recovery in stable condition.   08/17/2023, 1:29 PM

## 2023-08-17 NOTE — Transfer of Care (Signed)
 Immediate Anesthesia Transfer of Care Note  Patient: Ryan Wise  Procedure(s) Performed: ARTHROSCOPY, KNEE, WITH MEDIAL LATERAL MENISCAL DEBRIDEMENT (Left: Knee) DEBRIDEMENT, MENISCUS, KNEE (Left: Knee) ARTHROSCOPY, KNEE, WITH CARTILAGE PROCUREMENT FOR CULTURE (Left)  Patient Location: PACU  Anesthesia Type:General  Level of Consciousness: awake, alert , and oriented  Airway & Oxygen Therapy: Patient Spontanous Breathing and Patient connected to face mask oxygen  Post-op Assessment: Report given to RN and Post -op Vital signs reviewed and stable  Post vital signs: Reviewed and stable  Last Vitals:  Vitals Value Taken Time  BP 118/84 08/17/23 1342  Temp    Pulse 92 08/17/23 1343  Resp 17 08/17/23 1343  SpO2 98 % 08/17/23 1343  Vitals shown include unfiled device data.  Last Pain:  Vitals:   08/17/23 1342  TempSrc:   PainSc: Asleep         Complications: No notable events documented.

## 2023-08-17 NOTE — Anesthesia Postprocedure Evaluation (Signed)
 Anesthesia Post Note  Patient: Ryan Wise  Procedure(s) Performed: ARTHROSCOPY, KNEE, WITH MEDIAL LATERAL MENISCAL DEBRIDEMENT (Left: Knee) DEBRIDEMENT, MENISCUS, KNEE (Left: Knee)     Patient location during evaluation: PACU Anesthesia Type: General Level of consciousness: awake and alert Pain management: pain level controlled Vital Signs Assessment: post-procedure vital signs reviewed and stable Respiratory status: spontaneous breathing, nonlabored ventilation and respiratory function stable Cardiovascular status: blood pressure returned to baseline and stable Postop Assessment: no apparent nausea or vomiting Anesthetic complications: no   No notable events documented.  Last Vitals:  Vitals:   08/17/23 1430 08/17/23 1445  BP: 115/82 130/79  Pulse: 90 91  Resp: (!) 22 17  Temp:  36.5 C  SpO2: 92% 92%    Last Pain:  Vitals:   08/17/23 1445  TempSrc:   PainSc: 0-No pain                 Collene Schlichter

## 2023-08-18 ENCOUNTER — Encounter (HOSPITAL_COMMUNITY): Payer: Self-pay | Admitting: Orthopedic Surgery

## 2023-08-24 DIAGNOSIS — Z5189 Encounter for other specified aftercare: Secondary | ICD-10-CM | POA: Diagnosis not present

## 2023-08-27 DIAGNOSIS — M25462 Effusion, left knee: Secondary | ICD-10-CM | POA: Diagnosis not present

## 2023-08-27 DIAGNOSIS — Z9889 Other specified postprocedural states: Secondary | ICD-10-CM | POA: Diagnosis not present

## 2023-09-02 DIAGNOSIS — Z5189 Encounter for other specified aftercare: Secondary | ICD-10-CM | POA: Diagnosis not present

## 2023-09-14 ENCOUNTER — Encounter: Payer: Self-pay | Admitting: Internal Medicine

## 2023-09-16 DIAGNOSIS — G8929 Other chronic pain: Secondary | ICD-10-CM | POA: Diagnosis not present

## 2023-09-16 DIAGNOSIS — R682 Dry mouth, unspecified: Secondary | ICD-10-CM | POA: Diagnosis not present

## 2023-09-16 DIAGNOSIS — R21 Rash and other nonspecific skin eruption: Secondary | ICD-10-CM | POA: Diagnosis not present

## 2023-09-16 DIAGNOSIS — R350 Frequency of micturition: Secondary | ICD-10-CM | POA: Diagnosis not present

## 2023-09-16 DIAGNOSIS — M25562 Pain in left knee: Secondary | ICD-10-CM | POA: Diagnosis not present

## 2023-09-16 DIAGNOSIS — Z4789 Encounter for other orthopedic aftercare: Secondary | ICD-10-CM | POA: Diagnosis not present

## 2023-09-27 ENCOUNTER — Other Ambulatory Visit: Payer: Self-pay | Admitting: Internal Medicine

## 2023-10-05 MED ORDER — FUROSEMIDE 20 MG PO TABS: 20.0000 mg | ORAL_TABLET | Freq: Every day | ORAL | 3 refills | Status: AC

## 2023-11-05 DIAGNOSIS — E1122 Type 2 diabetes mellitus with diabetic chronic kidney disease: Secondary | ICD-10-CM | POA: Diagnosis not present

## 2023-11-05 DIAGNOSIS — N182 Chronic kidney disease, stage 2 (mild): Secondary | ICD-10-CM | POA: Diagnosis not present

## 2023-11-05 DIAGNOSIS — E785 Hyperlipidemia, unspecified: Secondary | ICD-10-CM | POA: Diagnosis not present

## 2023-11-05 DIAGNOSIS — I10 Essential (primary) hypertension: Secondary | ICD-10-CM | POA: Diagnosis not present

## 2023-11-12 DIAGNOSIS — I4891 Unspecified atrial fibrillation: Secondary | ICD-10-CM | POA: Diagnosis not present

## 2023-11-12 DIAGNOSIS — D638 Anemia in other chronic diseases classified elsewhere: Secondary | ICD-10-CM | POA: Diagnosis not present

## 2023-11-12 DIAGNOSIS — E785 Hyperlipidemia, unspecified: Secondary | ICD-10-CM | POA: Diagnosis not present

## 2023-11-12 DIAGNOSIS — E1122 Type 2 diabetes mellitus with diabetic chronic kidney disease: Secondary | ICD-10-CM | POA: Diagnosis not present

## 2023-11-12 DIAGNOSIS — E559 Vitamin D deficiency, unspecified: Secondary | ICD-10-CM | POA: Diagnosis not present

## 2023-11-12 DIAGNOSIS — J449 Chronic obstructive pulmonary disease, unspecified: Secondary | ICD-10-CM | POA: Diagnosis not present

## 2023-11-12 DIAGNOSIS — I129 Hypertensive chronic kidney disease with stage 1 through stage 4 chronic kidney disease, or unspecified chronic kidney disease: Secondary | ICD-10-CM | POA: Diagnosis not present

## 2023-11-12 DIAGNOSIS — I5032 Chronic diastolic (congestive) heart failure: Secondary | ICD-10-CM | POA: Diagnosis not present

## 2023-11-12 DIAGNOSIS — I2510 Atherosclerotic heart disease of native coronary artery without angina pectoris: Secondary | ICD-10-CM | POA: Diagnosis not present

## 2023-11-12 DIAGNOSIS — N182 Chronic kidney disease, stage 2 (mild): Secondary | ICD-10-CM | POA: Diagnosis not present

## 2023-11-12 DIAGNOSIS — I255 Ischemic cardiomyopathy: Secondary | ICD-10-CM | POA: Diagnosis not present

## 2023-12-14 ENCOUNTER — Other Ambulatory Visit: Payer: Self-pay | Admitting: Internal Medicine

## 2023-12-14 DIAGNOSIS — I4819 Other persistent atrial fibrillation: Secondary | ICD-10-CM

## 2023-12-30 DIAGNOSIS — H353131 Nonexudative age-related macular degeneration, bilateral, early dry stage: Secondary | ICD-10-CM | POA: Diagnosis not present

## 2023-12-30 DIAGNOSIS — H04123 Dry eye syndrome of bilateral lacrimal glands: Secondary | ICD-10-CM | POA: Diagnosis not present

## 2023-12-30 DIAGNOSIS — H11153 Pinguecula, bilateral: Secondary | ICD-10-CM | POA: Diagnosis not present

## 2023-12-30 DIAGNOSIS — Z961 Presence of intraocular lens: Secondary | ICD-10-CM | POA: Diagnosis not present

## 2024-01-27 ENCOUNTER — Other Ambulatory Visit: Payer: Self-pay | Admitting: Internal Medicine

## 2024-02-11 DIAGNOSIS — M1712 Unilateral primary osteoarthritis, left knee: Secondary | ICD-10-CM | POA: Diagnosis not present

## 2024-02-18 DIAGNOSIS — M1712 Unilateral primary osteoarthritis, left knee: Secondary | ICD-10-CM | POA: Diagnosis not present

## 2024-02-25 DIAGNOSIS — M1712 Unilateral primary osteoarthritis, left knee: Secondary | ICD-10-CM | POA: Diagnosis not present

## 2024-02-29 ENCOUNTER — Ambulatory Visit: Attending: Internal Medicine | Admitting: Internal Medicine

## 2024-02-29 ENCOUNTER — Encounter: Payer: Self-pay | Admitting: Internal Medicine

## 2024-02-29 VITALS — BP 132/88 | HR 82 | Resp 16 | Ht 70.0 in | Wt 289.8 lb

## 2024-02-29 DIAGNOSIS — I4819 Other persistent atrial fibrillation: Secondary | ICD-10-CM | POA: Diagnosis not present

## 2024-02-29 DIAGNOSIS — I5042 Chronic combined systolic (congestive) and diastolic (congestive) heart failure: Secondary | ICD-10-CM | POA: Diagnosis not present

## 2024-02-29 DIAGNOSIS — I10 Essential (primary) hypertension: Secondary | ICD-10-CM | POA: Diagnosis not present

## 2024-02-29 DIAGNOSIS — Z79899 Other long term (current) drug therapy: Secondary | ICD-10-CM

## 2024-02-29 DIAGNOSIS — Z9889 Other specified postprocedural states: Secondary | ICD-10-CM | POA: Diagnosis not present

## 2024-02-29 DIAGNOSIS — Z951 Presence of aortocoronary bypass graft: Secondary | ICD-10-CM

## 2024-02-29 DIAGNOSIS — Z01812 Encounter for preprocedural laboratory examination: Secondary | ICD-10-CM

## 2024-02-29 DIAGNOSIS — Z5181 Encounter for therapeutic drug level monitoring: Secondary | ICD-10-CM | POA: Diagnosis not present

## 2024-02-29 DIAGNOSIS — Z8679 Personal history of other diseases of the circulatory system: Secondary | ICD-10-CM

## 2024-02-29 NOTE — Progress Notes (Signed)
 OFFICE NOTE  Chief Complaint:  Knee pain  Primary Care Physician: Corlis Pagan, NP  HPI:  Ryan Wise is 83 y/o with a history of CAD, s/p CABG X 4 in 1999 with an LIMA-LAD, SVG-OM, SVG-Dx, SVG-RCA. He presented to [facility] 12/18/12 with a STEMI and was taken to the cath lab by Dr Burnard. This revealed the LIMA to the LAD to be patent with collaterals to the Dx. The SVG- OM: was occluded and the SVG- DX: was occluded The culprit appeared to be occlusion of SVG to distal RCA with no flow proximal due to anastomis occlusion with thrombus. He underwent a very difficult PCI with PTCA/ thrombectomy. He had no flow phenomenon requiring IC/IV NTG/IC verapamil / angiomax , brilinta  180 mg, integrelin. He ultimately received a Xience Xpedition stent to distal anastomosis. He did have acute, transient CHF but stabilized with diuresis. He did well and was transferred to telemetry on 12/21/12.. Troponin was greater than 20. EF at cath was 35%. EF by echo was 35=40% with severe hypokinesis of the basal inferolateral myocardium; moderate hypokinesis of the basal-mid inferior and mid inferolateral myocardium; and mild hypokinesis of the basal-mid inferoseptal and apical septal myocardium. After he was transferred to telemetry he awaked early in the morning of the 30th with neck pain. He was transferred back to the ICU by the cardiology fellow on call. The pt was seen and examined by Dr Mona the morning of the 29th. Dr Mona did not feel his symptoms were cardiac. We added a NSAID and Skelaxin . He took this for a day or 2 he reported his neck pain improved significantly.  Today is here for followup and appears to be doing fairly well. He does report some shortness of breath doing moderate to more intense activities which he is trying to avoid. He did some lawn mowing which made her short of breath after a while. 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 He does feel that his energy level has  improved significantly and hopefully this means that his EF has come up as well. He continues to take naproxen  on a daily basis, however it was intended that he only take this for a short period time for his neck pain. He did however discontinue Skelaxin . I reviewed recent laboratory work from his primary care provider today including a cholesterol profile. I was surprised to see how low that was, noting that his total cholesterol was 80, triglycerides 107 HDL 35 and LDL of 24. This is on atorvastatin  80 mg daily.  I saw Ryan Wise back in the office today. He continues to do very well. He is asymptomatic this had no significant weight gain or worsening shortness of breath. He denies any extremity edema. Blood pressure is very well-controlled today 122/60. He recently had laboratory work through his primary care provider which shows excellent cholesterol control. He does have a history of cardiomyopathy and is due for repeat echocardiogram.   Ryan Wise returns today for follow-up. Overall he is doing exceedingly well. He denies any worsening chest pain or shortness of breath. He's done well on his current medicines. Cholesterol is been well controlled. 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 Recent echocardiogram earlier this year shows an improvement in EF up to 45%.  01/31/2016  Ryan Wise returns today for follow-up. Unfortunately recently presented to the emergency department with abdominal pain, nausea and vomiting as well as diarrhea and was thought to have  a gastroenteritis. 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 Of note, he had an ultrasound of the abdomen in April 2016 which the radiologist commented that the abdominal aorta is poorly seen due to overlying bowel gas. Subsequently he underwent EVAR which is complicated by a small type II endoleak. Since surgery Ryan Wise is felt somewhat fatigued and does get short of  breath with some minimal exertion. In the office today he was noted to be in atrial fibrillation with a slow ventricular response at 47. This is a new diagnosis for him. He is on low-dose metoprolol , given his history of cardiomyopathy. He only takes aspirin  for his current anticoagulation. 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  02/14/2016  Ryan Wise returns today for follow-up. He has done well with discontinuing his beta blocker. Heart rate is now improved up to 58. He remains in atrial fibrillation. He does not feel quite as fatigued but does notice some fatigue. He seems to be tolerating Eliquis  without any bleeding problems. I did review his recent endoleak with Dr. Serene who felt it was okay to start Eliquis  in the setting of recent EVAR. At this point the next step would be to try to obtain a normal sinus rhythm. He will need to be anticoagulated for at least 3-4 weeks prior to cardioversion.  04/15/2016  Ryan Wise returns today for follow-up. He underwent outpatient cardioversion by myself which was unsuccessful. I therefore arranged for him to go on Tikosyn  therapy. He was admitted and loaded on 500 g twice daily of Tikosyn  and required cardioversion to get to sinus rhythm. This was successful and he has maintained sinus rhythm since then. He was discharged and followed up in the A. fib clinic. He was noted to have some hypokalemia and is due for repeat check of his potassium and magnesium . It was recommended that he have EKGs every 3 months and repeat of potassium and magnesium  as per prescribing guidelines. He recently was seen in the emergency department for a stabbing mid upper back pain. This was felt to be reproducible and musculoskeletal. He did have a CT scan which ruled out dissection. He was given muscle relaxants and noted improvement with that and Tylenol .  07/31/2016  Ryan Wise returns today for follow-up. Overall he seems to be doing  well. EKG shows sinus rhythm today. QTC of 469 ms. He is on Tikosyn  500 g twice a day. He is potassium 2 days ago was 4.6 however magnesium  was slightly low at 1.9. He is on magnesium  oxide 400 mg daily. He denies any chest pain or worsening shortness of breath. There has been about a 6 pound weight gain however he reports has not been as active. He recently had bilateral cataract surgery for which she is recovering. He also saw Dr. Serene recently, who noted that he had a small endoleak from his AAA repair but he will continue to monitor.  02/16/2017  Ryan Wise returns today for follow-up. He seems to be doing really well. His echo recent showed improvement in LV function with an EF increased to 45-50%. He denies any recurrent atrial fibrillation. He appears to be in either a low atrial accelerated junctional rhythm today is 69. QTC is 467 ms. He is on dofetilide  500 g twice a day. He also takes Eliquis  and denies any bleeding problems. He's scheduled for follow-up with Dr. Serene for an endoleak of his AAA repair. 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  09/02/2017  Ryan Wise was seen today in follow-up.  He denies any new symptoms.  He seems to be maintaining sinus on dofetilide .  His QTC today is 490 ms.  He had lab work in January which showed normal creatinine of 1.09.  Total cholesterol was 91 with LDL of 17.  Triglycerides were elevated to 21.  Hemoglobin A1c 6.5.  He continues to follow with Dr. Serene as well as interventional radiology for an endoleak of his prior AAA repair.  He is on apixaban  for stroke risk reduction.  He denies chest pain or worsening shortness of breath.  03/29/2019  Ryan Wise returns today for follow-up.  He was last seen in April via virtual visit.  He was without complaints.  Eventually, interventional radiology was able to stop an endoleak of his aortic graft.   Unfortunately his developed some anemia and was noted to be iron deficient on recent labs with an iron level of 10.  Otherwise H&H was 12.5 and 40.  His creatinine was normal.  His liver enzymes were normal.  His hemoglobin A1c was 6.6 and stable.  Cholesterol showed total of 72, triglycerides 136, HDL 28 and LDL of 20.  Vitamin B12 number was normal at 866 however he was recently placed on repletion.  Overall he feels well.  An EKG was performed today to monitor his Tikosyn .  The QTC is prolonged today at 547 ms, up from 4 to 90 ms during his last EKG.  There have been no new medication changes.  He is on potassium and magnesium  supplements.  He is maintaining sinus rhythm.  10/10/2019  Ryan Wise is seen today for follow-up.  Overall he seems to be doing well without complaints.  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  He denies any chest pain or shortness of breath.  He recently followed up with Dr. Serene for his AAA repair which has been stable if not improving.  He had some recent lipids done which showed excellent control of his cholesterol.  His LDL was 20 and a repeat just a few weeks ago was 28.  This is probably more treatment than he needs.  Blood pressure is well controlled today.  EKG shows he is maintaining sinus rhythm with first-degree AV block.  He does have some PVCs however he is asymptomatic with this.  QTC was 473 ms he remains on dofetilide  250 mcg twice daily.  04/11/2020  Ryan Wise returns today for follow-up.  Overall he is doing well without any new complaints.  He reports his AAA repair is stable without active endoleak.  His blood pressure has been well controlled.  He has no chest pain or worsening shortness of breath.  He denies palpitations or recurrent A. fib.  EKG today shows an ectopic atrial rhythm but the QTC is prolonged at 520 ms.  This is new for him.  He does take dofetilide  250 mcg twice daily  and Eliquis  which she is tolerating well.  He is also on supplemental potassium and magnesium .  Those will need to be reassessed.  11/18/2021  Ryan Wise is seen today in follow-up.  Overall he says he is doing very well.  He has been struggling with some right knee pain and that has improved significantly with artificial collagen  injections.  He denies any chest pain or worsening shortness of breath.  He has followed up with Dr. Serene but is overdue for repeat CT scan.  It is felt that his endovascular leak from AAA repair has healed.  He remains on dofetilide .  He had recent labs through his primary care provider.  This was in May 2023.  A1c was 7.1%.  Total cholesterol 88, HDL 35, triglycerides 129 and LDL 27.  Liver enzymes were normal.  Creatinine 1.11.  Potassium 4.8.  EKG personally reviewed today shows a sinus rhythm with sinus arrhythmia first-degree AV block and QTc 453 ms.  02/25/2023  Ryan Wise is seen today in follow-up.  Overall he says he is feeling well except he struggling with bilateral knee pain.  He is required some steroid injections as well as gel infusions which initially helped but recently have not helped much.  He is concerned that he may need to have surgery.  Blood pressure appears well-controlled today.  Personally rechecked it and was read at 118/72.  He is on dofetilide .  His EKG shows sinus rhythm with normal QTc and QRS duration.  He reports no bleeding issues on Eliquis .  He did have to cut up a tree that fell the other day, although he had done this for 20 to 30 years and did so carefully with a chainsaw.  His last echo was in 2018 which showed reduced LVEF at about 45%.  He had a recent repeat CT scan to look for follow-up of vascular endoleak related to his aneurysm repair.  He has follow-up with Dr. Luverne regarding this.  02/29/2024  Ryan Wise is seen today for follow-up.  From a cardiac standpoint he seems to be doing well.  He has been struggling with his left  knee.  Back in March he underwent arthroscopic surgery for meniscal tear.  Since then he has had problems with swelling and thought that he may have had gout, however it turned out not to be.  He still has difficulty ambulating.  He has been getting gel shots with some recent relief.  He is now 2 years out from his last evaluation of his endovascular repair which was in 2017.  He did have an endoleak which seem to have closed off.  He was followed with Dr. Serene and Dr. Luverne for this.  His last echo also showed some decline in LVEF down to 30 to 35% however previously it was up to 45%.  I did switch him from lisinopril  to Entresto  and added Jardiance , however the Jardiance  was not tolerated due to yeast infection  PMHx:  Past Medical History:  Diagnosis Date   Aneurysm of infrarenal abdominal aorta    a. 01/2016 s/p endovascular repair of AAA (EVAR).   Anxiety    Arthritis    Chronic combined systolic and diastolic CHF (congestive heart failure) (HCC)    a. 01/2016 Echo: EF 35-40%, diff HK, mildly dil Ao root, mild MR, mildly dil LA.   Coronary artery disease    a. 1999 s/p CABG x4 (LIMA->LAD, VG->OM, VG->Diag, VG->RCA); b. 11/2012 Inferior STEMI/PCI: LIMA->LAD ok, VG->Diag 100, VG->OM 100, VG->RCA was culprit (PTCA/thrombectomy->Xience Xpedition DES), EF 25%.   Depression    Family history of adverse reaction to anesthesia    Daughter N&V   GERD (gastroesophageal reflux disease)    High triglycerides    Hypertension    Ischemic Cardiomyopathy    a. 01/2016 Echo: EF 35-40%, diff HK, inflat, inf AK.  Obesity    Persistent atrial fibrillation (HCC)    a. Dx 01/2016-->slow vent response on beta blocker;  b. CHA2DS2VASc = 6-->Eliquis ;  c. 03/03/2016 unsuccessful DCCV.   Ruptured lumbar disc    ST elevation myocardial infarction (STEMI) of inferior wall (HCC) 12/18/2012   STEMI of inf. wall w/PCI with Xperdition stent to VG to distal RCA   Type II diabetes mellitus (HCC)     Past Surgical  History:  Procedure Laterality Date   BACK SURGERY     (5) back surgeries   CARDIOVERSION N/A 03/03/2016   Procedure: CARDIOVERSION;  Surgeon: Vinie JAYSON Maxcy, MD;  Location: [facility] ENDOSCOPY;  Service: Cardiovascular;  Laterality: N/A;   CARDIOVERSION N/A 03/12/2016   Procedure: CARDIOVERSION;  Surgeon: Annabella Scarce, MD;  Location: [facility] ENDOSCOPY;  Service: Cardiovascular;  Laterality: N/A;   CATARACT EXTRACTION, BILATERAL Right 07/10/2016   left eye 07/24/16   CORONARY ARTERY BYPASS GRAFT  1999   (CAD) CABG was a 5-vessel bypass and had a questionable history of A fib. He underwent monitoring whick showed sinus bradycardia and PACs but no evidence of A fib.   ENDOVASCULAR STENT INSERTION N/A 12/28/2015   Procedure: ENDOVASCULAR STENT GRAFT INSERTION;  Surgeon: Gaile LELON New, MD;  Location: [facility] OR;  Service: Vascular;  Laterality: N/A;   IR ANGIOGRAM PELVIS SELECTIVE OR SUPRASELECTIVE  04/30/2018   IR ANGIOGRAM SELECTIVE EACH ADDITIONAL VESSEL  04/30/2018   IR ANGIOGRAM VISCERAL SELECTIVE  04/30/2018   IR EMBO ARTERIAL NOT HEMORR HEMANG INC GUIDE ROADMAPPING  04/30/2018   IR RADIOLOGIST EVAL & MGMT  03/24/2017   IR RADIOLOGIST EVAL & MGMT  04/14/2018   IR RADIOLOGIST EVAL & MGMT  07/08/2018   IR RADIOLOGIST EVAL & MGMT  05/11/2019   IR RADIOLOGIST EVAL & MGMT  05/31/2020   IR RADIOLOGIST EVAL & MGMT  01/10/2022   IR RADIOLOGIST EVAL & MGMT  03/13/2023   IR US  GUIDE VASC ACCESS RIGHT  04/30/2018   KNEE ARTHROSCOPY WITH LATERAL MENISECTOMY Left 08/17/2023   Procedure: ARTHROSCOPY, KNEE, WITH MEDIAL LATERAL MENISCAL DEBRIDEMENT;  Surgeon: Melodi Lerner, MD;  Location: [facility] ORS;  Service: Orthopedics;  Laterality: Left;   LEFT HEART CATHETERIZATION WITH CORONARY/GRAFT ANGIOGRAM  12/18/2012   Procedure: LEFT HEART CATHETERIZATION WITH EL BILE;  Surgeon: Debby DELENA Sor, MD;  Location: [facility] CATH LAB;  Service: Cardiovascular;;   LUMBAR DISC SURGERY     MENISCUS DEBRIDEMENT Left 08/17/2023    Procedure: DEBRIDEMENT, MENISCUS, KNEE;  Surgeon: Melodi Lerner, MD;  Location: [facility] ORS;  Service: Orthopedics;  Laterality: Left;   open heart surgery  03/1998   TONSILLECTOMY      FAMHx:  Family History  Problem Relation Age of Onset   CAD Mother    Heart disease Mother    CAD Brother    Heart attack Brother    Hyperlipidemia Sister    Leukemia Father    CAD Maternal Grandmother    Brain cancer Brother    CAD Sister     SOCHx:   reports that he quit smoking about 36 years ago. His smoking use included cigarettes. He started smoking about 66 years ago. He has a 135 pack-year smoking history. He has quit using smokeless tobacco.  His smokeless tobacco use included chew. He reports that he does not drink alcohol and does not use drugs.  ALLERGIES:  Allergies  Allergen Reactions   Other Other (See Comments)    Pain medication that he was given after open heart surgery-sweating and hallucinations (  possibly percocet)   Cholecalciferol [Vitamin D-3] Other (See Comments)    CAUSES BOILS    Fish Oil Hives   Ibuprofen     Bothers stomach    Aspirin  Nausea And Vomiting    Can't take full strength uncoated aspirin    Oxycodone  Other (See Comments)    Hallucinations    ROS: Pertinent items noted in HPI and remainder of comprehensive ROS otherwise negative.  HOME MEDS: Current Outpatient Medications  Medication Sig Dispense Refill   acetaminophen  (TYLENOL ) 500 MG tablet Take 1,500 mg by mouth 2 (two) times daily.     albuterol  (PROVENTIL  HFA;VENTOLIN  HFA) 108 (90 Base) MCG/ACT inhaler Inhale 1 puff into the lungs every 6 (six) hours as needed for wheezing or shortness of breath.     aspirin  EC 81 MG EC tablet Take 1 tablet (81 mg total) by mouth daily.     atorvastatin  (LIPITOR) 40 MG tablet TAKE 1 TABLET BY MOUTH DAILY AT 6 PM. 90 tablet 1   carboxymethylcellulose (REFRESH PLUS) 0.5 % SOLN Place 1 drop into both eyes 3 (three) times daily as needed (DRY EYES).     cyanocobalamin  (VITAMIN B12) 1000 MCG tablet Take 1,000 mcg by mouth daily.     dofetilide  (TIKOSYN ) 250 MCG capsule TAKE 1 CAPSULE BY MOUTH 2 TIMES DAILY. 180 capsule 0   ELIQUIS  5 MG TABS tablet TAKE 1 TABLET BY MOUTH TWICE A DAY 180 tablet 1   famotidine  (PEPCID ) 20 MG tablet Take 20 mg by mouth 2 (two) times daily.     Flaxseed, Linseed, (FLAX SEED OIL PO) Take 2 capsules by mouth 2 (two) times daily.     furosemide  (LASIX ) 20 MG tablet Take 1 tablet (20 mg total) by mouth daily. 90 tablet 3   HYDROcodone -acetaminophen  (NORCO/VICODIN) 5-325 MG tablet Take 1 tablet by mouth every 6 (six) hours as needed for moderate pain (pain score 4-6). 30 tablet 0   isosorbide  mononitrate (IMDUR ) 30 MG 24 hr tablet TAKE 1 TABLET BY MOUTH EVERY DAY 90 tablet 3   ketoconazole (NIZORAL) 2 % cream APPLY DAILY TO SKIN EVERY DAY FOR 14 DAYS 30 g 3   loratadine-pseudoephedrine (CLARITIN-D 12-HOUR) 5-120 MG tablet Take 1 tablet by mouth every evening.     LORazepam  (ATIVAN ) 0.5 MG tablet Take 0.5 mg by mouth 2 (two) times daily.     Magnesium  400 MG CAPS Take 400 mg by mouth 2 (two) times daily. 60 capsule 5   metFORMIN (GLUCOPHAGE-XR) 750 MG 24 hr tablet 1 TABLET WITH EVENING MEAL ORALLY ONCE A DAY 90 90 tablet 3   methocarbamol  (ROBAXIN ) 500 MG tablet Take 1 tablet (500 mg total) by mouth every 6 (six) hours as needed for muscle spasms. 30 tablet 0   nitroGLYCERIN  (NITROSTAT ) 0.4 MG SL tablet Place 1 tablet (0.4 mg total) under the tongue every 5 (five) minutes as needed for chest pain. 25 tablet 3   potassium chloride  SA (KLOR-CON  M20) 20 MEQ tablet TAKE 1.5 TABLETS (30 MEQ TOTAL) BY MOUTH DAILY. 135 tablet 3   sacubitril-valsartan (ENTRESTO ) 24-26 MG Take 1 tablet by mouth 2 (two) times daily. 60 tablet 11   No current facility-administered medications for this visit.    LABS/IMAGING: No results found for this or any previous visit (from the past 48 hours). No results found.  VITALS: BP 132/88 (BP Location: Left Arm,  Patient Position: Sitting, Cuff Size: Large)   Pulse 82   Resp 16   Ht 5' 10 (1.778 m)  Wt 289 lb 12.8 oz (131.5 kg)   SpO2 93%   BMI 41.58 kg/m   EXAM: General appearance: alert and no distress Neck: no carotid bruit, no JVD and thyroid  not enlarged, symmetric, no tenderness/mass/nodules Lungs: clear to auscultation bilaterally Heart: regular rate and rhythm, S1, S2 normal, no murmur, click, rub or gallop Abdomen: soft, non-tender; bowel sounds normal; no masses,  no organomegaly Extremities: extremities normal, atraumatic, no cyanosis or edema Pulses: 2+ and symmetric Skin: Skin color, texture, turgor normal. No rashes or lesions Neurologic: Grossly normal Psych: Pleasant  EKG: EKG Interpretation Date/Time:  Monday February 29 2024 08:23:17 EDT Ventricular Rate:  84 PR Interval:  320 QRS Duration:  118 QT Interval:  390 QTC Calculation: 460 R Axis:   -5  Text Interpretation: Unusual P axis, possible ectopic atrial rhythm Non-specific intra-ventricular conduction delay When compared with ECG of 01-Jul-2023 21:54, Ectopic atrial rhythm has replaced Sinus rhythm Nonspecific T wave abnormality no longer evident in Inferior leads Nonspecific T wave abnormality, improved in Lateral leads Confirmed by Mona Kent [phone]) on 02/29/2024 8:29:24 AM    ASSESSMENT: Atrial fibrillation with slow ventricular response-CHADSVASC score of 5 Chronic Tikosyn  therapy S/p AAA status post -EVAR with type II endoleak Coronary artery disease status post CABG with ST elevation MI and stent placement to the vein graft to PDA, with occluded grafts to the OM and diagonal vessels and a patent LIMA to LAD Ischemic cardiac myopathy EF 45% (improved to 45-50% by echo in 01/2017), prior inferior infarct, decline to 30 to 35% (02/2023) Hypertension Dyslipidemia  PLAN: 1.   Ryan Wise is doing well from a cardiac standpoint but is struggling with left knee pain issues.  No evidence of any A-fib today.   He remains on dofetilide  with a normal QTc although his EKG does show an ectopic atrial rhythm today.  No bleeding issues on Eliquis .  He did have an EVAR with a type II endoleak.  Will plan a repeat CT and schedule follow-up with Dr. Serene to evaluate this.  His LVEF had declined to 30 to 35% in 2024.  He was switched to Entresto .  He cannot tolerate Jardiance .  Will plan a repeat echo now to see if the LVEF has improved or whether we can titrate his medications further.  Blood pressure today was 132/88 and it may allow further titration of his medicine.  Plan otherwise follow-up annually or sooner as necessary.  Kent KYM Mona, MD, [facility], FNLA, FACP  [place]  [facility]  Medical Director of the Advanced Lipid Disorders &  Cardiovascular Risk Reduction Clinic Diplomate of the American Board of Clinical Lipidology Attending Cardiologist  Direct Dial: [phone]  Fax: [phone]  Website:  [email or web address]    Kent BROCKS Kwaku Mostafa 02/29/2024, 8:29 AM

## 2024-02-29 NOTE — Patient Instructions (Signed)
 Medication Instructions:  NO CHANGES  *If you need a refill on your cardiac medications before your next appointment, please call your pharmacy*  Lab Work: NON-FASTING BMET prior to CT Test  Testing/Procedures: CT Angiogram Abdomen/Pelvis  Your physician has requested that you have an echocardiogram. Echocardiography is a painless test that uses sound waves to create images of your heart. It provides your doctor with information about the size and shape of your heart and how well your heart's chambers and valves are working. This procedure takes approximately one hour. There are no restrictions for this procedure. Please do NOT wear cologne, perfume, aftershave, or lotions (deodorant is allowed). Please arrive 15 minutes prior to your appointment time.  Please note: We ask at that you not bring children with you during ultrasound (echo/ vascular) testing. Due to room size and safety concerns, children are not allowed in the ultrasound rooms during exams. Our front office staff cannot provide observation of children in our lobby area while testing is being conducted. An adult accompanying a patient to their appointment will only be allowed in the ultrasound room at the discretion of the ultrasound technician under special circumstances. We apologize for any inconvenience.   Follow-Up: At [facility], you and your health needs are our priority.  As part of our continuing mission to provide you with exceptional heart care, our providers are all part of one team.  This team includes your primary Cardiologist (physician) and Advanced Practice Providers or APPs (Physician Assistants and Nurse Practitioners) who all work together to provide you with the care you need, when you need it.   12 months  We recommend signing up for the patient portal called MyChart.  Sign up information is provided on this After Visit Summary.  MyChart is used to connect with patients for Virtual Visits  (Telemedicine).  Patients are able to view lab/test results, encounter notes, upcoming appointments, etc.  Non-urgent messages can be sent to your provider as well.   To learn more about what you can do with MyChart, go to [email or web address].   Other Instructions  You have been referred back to the vascular surgery team

## 2024-03-06 ENCOUNTER — Other Ambulatory Visit: Payer: Self-pay | Admitting: Internal Medicine

## 2024-03-07 ENCOUNTER — Other Ambulatory Visit: Payer: Self-pay | Admitting: Internal Medicine

## 2024-04-06 LAB — BASIC METABOLIC PANEL WITH GFR
BUN/Creatinine Ratio: 15 (ref 10–24)
BUN: 18 mg/dL (ref 8–27)
CO2: 26 mmol/L (ref 20–29)
Calcium: 9 mg/dL (ref 8.6–10.2)
Chloride: 101 mmol/L (ref 96–106)
Creatinine, Ser: 1.2 mg/dL (ref 0.76–1.27)
Glucose: 132 mg/dL — ABNORMAL HIGH (ref 70–99)
Potassium: 4.4 mmol/L (ref 3.5–5.2)
Sodium: 142 mmol/L (ref 134–144)
eGFR: 60 mL/min/1.73 (ref >59)

## 2024-04-11 ENCOUNTER — Ambulatory Visit: Payer: Self-pay | Admitting: Internal Medicine

## 2024-04-11 DIAGNOSIS — I5042 Chronic combined systolic (congestive) and diastolic (congestive) heart failure: Secondary | ICD-10-CM

## 2024-04-13 ENCOUNTER — Ambulatory Visit (HOSPITAL_COMMUNITY)
Admission: RE | Admit: 2024-04-13 | Discharge: 2024-04-13 | Disposition: A | Discharge status: Home / Self Care | Source: Ambulatory Visit | Attending: Internal Medicine | Admitting: Internal Medicine

## 2024-04-13 DIAGNOSIS — I5042 Chronic combined systolic (congestive) and diastolic (congestive) heart failure: Secondary | ICD-10-CM | POA: Diagnosis not present

## 2024-04-13 DIAGNOSIS — Z9889 Other specified postprocedural states: Secondary | ICD-10-CM | POA: Diagnosis present

## 2024-04-13 DIAGNOSIS — Z8679 Personal history of other diseases of the circulatory system: Secondary | ICD-10-CM

## 2024-04-13 LAB — ECHOCARDIOGRAM COMPLETE
Area-P 1/2: 6.83 cm2
S' Lateral: 5.6 cm

## 2024-04-13 MED ORDER — PERFLUTREN LIPID MICROSPHERE
1.0000 mL | INTRAVENOUS | Status: DC | PRN
  Administered 2024-04-13: 2 mL via INTRAVENOUS

## 2024-04-13 MED ORDER — SODIUM CHLORIDE (PF) 0.9 % IJ SOLN
INTRAMUSCULAR | Status: AC
  Filled 2024-04-13: qty 50

## 2024-04-13 MED ORDER — IOHEXOL 350 MG/ML SOLN
100.0000 mL | Freq: Once | INTRAVENOUS | Status: AC | PRN
  Administered 2024-04-13: 100 mL via INTRAVENOUS

## 2024-04-25 ENCOUNTER — Encounter: Payer: Self-pay | Admitting: Surgery

## 2024-04-25 ENCOUNTER — Ambulatory Visit: Attending: Surgery | Admitting: Surgery

## 2024-04-25 VITALS — BP 117/78 | HR 79 | Temp 98.2°F | Ht 70.0 in | Wt 287.0 lb

## 2024-04-25 DIAGNOSIS — I7143 Infrarenal abdominal aortic aneurysm, without rupture: Secondary | ICD-10-CM

## 2024-04-25 NOTE — Progress Notes (Signed)
 [facility]ist of [facility]  Patient name: Ryan Wise MRN: [id number] DOB: Dec 15, 1940 Sex: male   REASON FOR VISIT:    Follow up  HISOTRY OF PRESENT ILLNESS:    Ryan Wise is a 83 y.o. male returns today for follow-up.  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  His postoperative course was uncomplicated.  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 Luverne. He denies any abdominal pain.  I have not seen him for several years.  He has no complaints today       He has a history of CAD, s/p CABG.  He is followed by Dr. Mona.  He is on a statin for hypercholesterolemia.  He takes an ASA.  He is medically managed for hypertension.  He is a former smoker.  He remains very active.   PAST MEDICAL HISTORY:   Past Medical History:  Diagnosis Date   Aneurysm of infrarenal abdominal aorta    a. 01/2016 s/p endovascular repair of AAA (EVAR).   Anxiety    Arthritis    Chronic combined systolic and diastolic CHF (congestive heart failure) (HCC)    a. 01/2016 Echo: EF 35-40%, diff HK, mildly dil Ao root, mild MR, mildly dil LA.   Coronary artery disease    a. 1999 s/p CABG x4 (LIMA->LAD, VG->OM, VG->Diag, VG->RCA); b. 11/2012 Inferior STEMI/PCI: LIMA->LAD ok, VG->Diag 100, VG->OM 100, VG->RCA was culprit (PTCA/thrombectomy->Xience Xpedition DES), EF 25%.   Depression    Family history of adverse reaction to anesthesia    Daughter N&V   GERD (gastroesophageal reflux disease)    High triglycerides    Hypertension    Ischemic Cardiomyopathy    a. 01/2016 Echo: EF 35-40%, diff HK, inflat, inf AK.   Obesity    Persistent atrial fibrillation (HCC)    a. Dx 01/2016-->slow vent response on beta blocker;  b. CHA2DS2VASc = 6-->Eliquis ;  c. 03/03/2016  unsuccessful DCCV.   Ruptured lumbar disc    ST elevation myocardial infarction (STEMI) of inferior wall (HCC) 12/18/2012   STEMI of inf. wall w/PCI with Xperdition stent to VG to distal RCA   Type II diabetes mellitus (HCC)      FAMILY HISTORY:   Family History  Problem Relation Age of Onset   CAD Mother    Heart disease Mother    CAD Brother    Heart attack Brother    Hyperlipidemia Sister    Leukemia Father    CAD Maternal Grandmother    Brain cancer Brother    CAD Sister     SOCIAL HISTORY:   Social History   Tobacco Use   Smoking status: Former    Current packs/day: 0.00    Average packs/day: 4.5 packs/day for 30.0 years (135.0 ttl pk-yrs)    Types: Cigarettes    Start date: 05/06/1957    Quit date: 05/07/1987    Years since quitting: 36.9   Smokeless tobacco: Former    Types: Chew  Substance Use Topics   Alcohol use: No     ALLERGIES:   Allergies  Allergen Reactions   Other Other (See Comments)    Pain medication that he was given after open heart surgery-sweating and hallucinations (possibly percocet)   Cholecalciferol [Vitamin D-3] Other (See Comments)    CAUSES BOILS  Fish Oil Hives   Ibuprofen     Bothers stomach    Jardiance  [Empagliflozin ]     Yeast infection   Aspirin  Nausea And Vomiting    Can't take full strength uncoated aspirin    Oxycodone  Other (See Comments)    Hallucinations     CURRENT MEDICATIONS:   Current Outpatient Medications  Medication Sig Dispense Refill   acetaminophen  (TYLENOL ) 500 MG tablet Take 1,500 mg by mouth 2 (two) times daily.     albuterol  (PROVENTIL  HFA;VENTOLIN  HFA) 108 (90 Base) MCG/ACT inhaler Inhale 1 puff into the lungs every 6 (six) hours as needed for wheezing or shortness of breath.     aspirin  EC 81 MG EC tablet Take 1 tablet (81 mg total) by mouth daily.     atorvastatin  (LIPITOR) 40 MG tablet TAKE 1 TABLET BY MOUTH DAILY AT 6 PM. 90 tablet 3   carboxymethylcellulose (REFRESH PLUS) 0.5 % SOLN  Place 1 drop into both eyes 3 (three) times daily as needed (DRY EYES).     cyanocobalamin (VITAMIN B12) 1000 MCG tablet Take 1,000 mcg by mouth daily.     dofetilide  (TIKOSYN ) 250 MCG capsule TAKE 1 CAPSULE BY MOUTH TWICE A DAY 180 capsule 3   ELIQUIS  5 MG TABS tablet TAKE 1 TABLET BY MOUTH TWICE A DAY 180 tablet 1   famotidine  (PEPCID ) 20 MG tablet Take 20 mg by mouth 2 (two) times daily.     Flaxseed, Linseed, (FLAX SEED OIL PO) Take 2 capsules by mouth 2 (two) times daily.     furosemide  (LASIX ) 20 MG tablet Take 1 tablet (20 mg total) by mouth daily. 90 tablet 3   HYDROcodone -acetaminophen  (NORCO/VICODIN) 5-325 MG tablet Take 1 tablet by mouth every 6 (six) hours as needed for moderate pain (pain score 4-6). 30 tablet 0   isosorbide  mononitrate (IMDUR ) 30 MG 24 hr tablet TAKE 1 TABLET BY MOUTH EVERY DAY 90 tablet 3   ketoconazole (NIZORAL) 2 % cream APPLY DAILY TO SKIN EVERY DAY FOR 14 DAYS 30 g 3   loratadine-pseudoephedrine (CLARITIN-D 12-HOUR) 5-120 MG tablet Take 1 tablet by mouth every evening.     LORazepam  (ATIVAN ) 0.5 MG tablet Take 0.5 mg by mouth 2 (two) times daily.     Magnesium  400 MG CAPS Take 400 mg by mouth 2 (two) times daily. 60 capsule 5   metFORMIN (GLUCOPHAGE-XR) 750 MG 24 hr tablet 1 TABLET WITH EVENING MEAL ORALLY ONCE A DAY 90 90 tablet 3   methocarbamol  (ROBAXIN ) 500 MG tablet Take 1 tablet (500 mg total) by mouth every 6 (six) hours as needed for muscle spasms. 30 tablet 0   nitroGLYCERIN  (NITROSTAT ) 0.4 MG SL tablet Place 1 tablet (0.4 mg total) under the tongue every 5 (five) minutes as needed for chest pain. 25 tablet 3   potassium chloride  SA (KLOR-CON  M20) 20 MEQ tablet TAKE 1.5 TABLETS (30 MEQ TOTAL) BY MOUTH DAILY. 135 tablet 3   sacubitril-valsartan (ENTRESTO ) 24-26 MG TAKE 1 TABLET BY MOUTH TWICE A DAY 180 tablet 3   No current facility-administered medications for this visit.    REVIEW OF SYSTEMS:   [X]  denotes positive finding, [ ]  denotes negative  finding Cardiac  Comments:  Chest pain or chest pressure:    Shortness of breath upon exertion:    Short of breath when lying flat:    Irregular heart rhythm:        Vascular    Pain in calf, thigh, or hip brought on by ambulation:  Pain in feet at night that wakes you up from your sleep:     Blood clot in your veins:    Leg swelling:         Pulmonary    Oxygen at home:    Productive cough:     Wheezing:         Neurologic    Sudden weakness in arms or legs:     Sudden numbness in arms or legs:     Sudden onset of difficulty speaking or slurred speech:    Temporary loss of vision in one eye:     Problems with dizziness:         Gastrointestinal    Blood in stool:     Vomited blood:         Genitourinary    Burning when urinating:     Blood in urine:        Psychiatric    Major depression:         Hematologic    Bleeding problems:    Problems with blood clotting too easily:        Skin    Rashes or ulcers:        Constitutional    Fever or chills:      PHYSICAL EXAM:   Vitals:   04/25/24 0928  BP: 117/78  Pulse: 79  Temp: 98.2 F (36.8 C)  SpO2: 92%  Weight: 287 lb (130.2 kg)  Height: 5' 10 (1.778 m)    GENERAL: The patient is a well-nourished male, in no acute distress. The vital signs are documented above. CARDIAC: There is a regular rate and rhythm.  PULMONARY: Non-labored respirations ABDOMEN: Soft and non-tender   MUSCULOSKELETAL: There are no major deformities or cyanosis. NEUROLOGIC: No focal weakness or paresthesias are detected. SKIN: There are no ulcers or rashes noted. PSYCHIATRIC: The patient has a normal affect.  STUDIES:   I have reviewed the following: 1. Using various imaging planes, the abdominal aortic aneurysm is favored to be stable (within 1 mm in size). 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 2. 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 3. 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   MEDICAL ISSUES:   AAA: There has been minimal change in the size of this aneurysm, measuring 10 cm.  There is a questionable type Ib endoleak on the left however there does not appear to be any contrast filling the aneurysm sac.  Delayed images did not go down for now to see the distal aspect of the stent.  Since there is no clear-cut 1B endoleak, I will reimage him in 6 months.    Malvina Serene CLORE, MD, FACS [facility] [phone] Pager [phone]

## 2024-04-26 ENCOUNTER — Other Ambulatory Visit: Payer: Self-pay | Admitting: Interventional Radiology

## 2024-04-26 DIAGNOSIS — I9789 Other postprocedural complications and disorders of the circulatory system, not elsewhere classified: Secondary | ICD-10-CM

## 2024-04-27 ENCOUNTER — Telehealth: Payer: Self-pay | Admitting: *Deleted

## 2024-04-27 NOTE — Telephone Encounter (Signed)
 I called to schedule Ryan Wise f/u with Dr. Luverne from [facility]. Per his daughter Rosaline he is following Dr Serene. If anything changes and Dr. Serene feels he needs to see Dr. Luverne they will come back to [facility]

## 2024-05-09 LAB — LAB REPORT - SCANNED
Albumin, Urine POC: 3
Albumin/Creatinine Ratio, Urine, POC: 3
Creatinine, POC: 101.3 mg/dL
TSH: 1.54 (ref 0.41–5.90)

## 2024-06-05 ENCOUNTER — Other Ambulatory Visit: Payer: Self-pay | Admitting: Internal Medicine

## 2024-06-05 DIAGNOSIS — I4819 Other persistent atrial fibrillation: Secondary | ICD-10-CM

## 2024-06-14 ENCOUNTER — Other Ambulatory Visit: Payer: Self-pay | Admitting: Internal Medicine

## 2024-10-31 ENCOUNTER — Ambulatory Visit: Admitting: Surgery
